# Patient Record
Sex: Female | Born: 1935
Health system: Southern US, Community
[De-identification: ages and names within clinical notes are randomized; demographics above are authoritative.]

## PROBLEM LIST (undated history)

## (undated) DIAGNOSIS — M26629 Arthralgia of temporomandibular joint, unspecified side: Secondary | ICD-10-CM

## (undated) DIAGNOSIS — E041 Nontoxic single thyroid nodule: Secondary | ICD-10-CM

## (undated) DIAGNOSIS — R011 Cardiac murmur, unspecified: Secondary | ICD-10-CM

## (undated) DIAGNOSIS — R51 Headache: Secondary | ICD-10-CM

## (undated) DIAGNOSIS — I739 Peripheral vascular disease, unspecified: Secondary | ICD-10-CM

## (undated) DIAGNOSIS — C642 Malignant neoplasm of left kidney, except renal pelvis: Secondary | ICD-10-CM

## (undated) DIAGNOSIS — H3530 Unspecified macular degeneration: Secondary | ICD-10-CM

## (undated) DIAGNOSIS — Z973 Presence of spectacles and contact lenses: Secondary | ICD-10-CM

## (undated) DIAGNOSIS — E049 Nontoxic goiter, unspecified: Secondary | ICD-10-CM

## (undated) DIAGNOSIS — E039 Hypothyroidism, unspecified: Secondary | ICD-10-CM

## (undated) DIAGNOSIS — F32A Depression, unspecified: Secondary | ICD-10-CM

## (undated) DIAGNOSIS — R310 Gross hematuria: Secondary | ICD-10-CM

## (undated) DIAGNOSIS — R739 Hyperglycemia, unspecified: Secondary | ICD-10-CM

## (undated) DIAGNOSIS — N390 Urinary tract infection, site not specified: Secondary | ICD-10-CM

## (undated) DIAGNOSIS — R300 Dysuria: Secondary | ICD-10-CM

## (undated) DIAGNOSIS — I10 Essential (primary) hypertension: Secondary | ICD-10-CM

## (undated) DIAGNOSIS — Z8781 Personal history of (healed) traumatic fracture: Secondary | ICD-10-CM

## (undated) DIAGNOSIS — K449 Diaphragmatic hernia without obstruction or gangrene: Secondary | ICD-10-CM

## (undated) DIAGNOSIS — F329 Major depressive disorder, single episode, unspecified: Secondary | ICD-10-CM

## (undated) DIAGNOSIS — R109 Unspecified abdominal pain: Secondary | ICD-10-CM

## (undated) DIAGNOSIS — Z9889 Other specified postprocedural states: Secondary | ICD-10-CM

## (undated) DIAGNOSIS — N2889 Other specified disorders of kidney and ureter: Secondary | ICD-10-CM

## (undated) DIAGNOSIS — M810 Age-related osteoporosis without current pathological fracture: Secondary | ICD-10-CM

## (undated) DIAGNOSIS — I2584 Coronary atherosclerosis due to calcified coronary lesion: Secondary | ICD-10-CM

## (undated) DIAGNOSIS — E278 Other specified disorders of adrenal gland: Secondary | ICD-10-CM

## (undated) DIAGNOSIS — M797 Fibromyalgia: Secondary | ICD-10-CM

## (undated) DIAGNOSIS — Z8489 Family history of other specified conditions: Secondary | ICD-10-CM

## (undated) DIAGNOSIS — I700 Atherosclerosis of aorta: Secondary | ICD-10-CM

## (undated) DIAGNOSIS — R112 Nausea with vomiting, unspecified: Secondary | ICD-10-CM

## (undated) DIAGNOSIS — M1990 Unspecified osteoarthritis, unspecified site: Secondary | ICD-10-CM

## (undated) DIAGNOSIS — K219 Gastro-esophageal reflux disease without esophagitis: Secondary | ICD-10-CM

## (undated) DIAGNOSIS — I2510 Atherosclerotic heart disease of native coronary artery without angina pectoris: Secondary | ICD-10-CM

## (undated) DIAGNOSIS — E119 Type 2 diabetes mellitus without complications: Secondary | ICD-10-CM

## (undated) DIAGNOSIS — Z85038 Personal history of other malignant neoplasm of large intestine: Secondary | ICD-10-CM

## (undated) DIAGNOSIS — D649 Anemia, unspecified: Secondary | ICD-10-CM

## (undated) DIAGNOSIS — R040 Epistaxis: Secondary | ICD-10-CM

## (undated) HISTORY — DX: Essential (primary) hypertension: I10

## (undated) HISTORY — PX: TONSILLECTOMY: SUR1361

## (undated) HISTORY — DX: Atherosclerotic heart disease of native coronary artery without angina pectoris: I25.10

## (undated) HISTORY — PX: OTHER SURGICAL HISTORY: SHX169

## (undated) HISTORY — PX: APPENDECTOMY: SHX54

## (undated) HISTORY — PX: TOTAL KNEE ARTHROPLASTY: SHX125

## (undated) HISTORY — PX: TEMPOROMANDIBULAR JOINT SURGERY: SHX35

## (undated) HISTORY — DX: Fibromyalgia: M79.7

## (undated) HISTORY — DX: Gastro-esophageal reflux disease without esophagitis: K21.9

## (undated) HISTORY — DX: Nontoxic goiter, unspecified: E04.9

## (undated) HISTORY — DX: Coronary atherosclerosis due to calcified coronary lesion: I25.84

## (undated) HISTORY — PX: LAPAROSCOPIC RIGHT HEMI COLECTOMY: SHX5926

## (undated) HISTORY — PX: ESOPHAGOGASTRODUODENOSCOPY ENDOSCOPY: SHX5814

## (undated) HISTORY — PX: COLONOSCOPY W/ BIOPSIES: SHX1374

## (undated) HISTORY — PX: CATARACT EXTRACTION W/ INTRAOCULAR LENS  IMPLANT, BILATERAL: SHX1307

---

## 1998-01-17 HISTORY — PX: LUMBAR SPINE SURGERY: SHX701

## 1998-04-27 ENCOUNTER — Other Ambulatory Visit: Admission: RE | Admit: 1998-04-27 | Discharge: 1998-04-27 | Payer: Self-pay | Admitting: Obstetrics and Gynecology

## 1998-06-10 ENCOUNTER — Encounter: Payer: Self-pay | Admitting: Neurosurgery

## 1998-06-10 ENCOUNTER — Ambulatory Visit (HOSPITAL_COMMUNITY): Admission: RE | Admit: 1998-06-10 | Discharge: 1998-06-11 | Payer: Self-pay | Admitting: Neurosurgery

## 1998-07-24 ENCOUNTER — Ambulatory Visit (HOSPITAL_COMMUNITY): Admission: RE | Admit: 1998-07-24 | Discharge: 1998-07-24 | Payer: Self-pay | Admitting: Gastroenterology

## 2000-06-05 ENCOUNTER — Other Ambulatory Visit: Admission: RE | Admit: 2000-06-05 | Discharge: 2000-06-05 | Payer: Self-pay | Admitting: Obstetrics and Gynecology

## 2000-06-22 ENCOUNTER — Encounter: Payer: Self-pay | Admitting: Family Medicine

## 2000-06-22 ENCOUNTER — Ambulatory Visit (HOSPITAL_COMMUNITY): Admission: RE | Admit: 2000-06-22 | Discharge: 2000-06-22 | Payer: Self-pay | Admitting: Family Medicine

## 2000-06-26 ENCOUNTER — Ambulatory Visit (HOSPITAL_COMMUNITY): Admission: RE | Admit: 2000-06-26 | Discharge: 2000-06-26 | Payer: Self-pay | Admitting: Cardiovascular Disease

## 2000-06-28 ENCOUNTER — Ambulatory Visit (HOSPITAL_COMMUNITY): Admission: RE | Admit: 2000-06-28 | Discharge: 2000-06-28 | Payer: Self-pay | Admitting: Cardiology

## 2000-07-12 ENCOUNTER — Encounter: Payer: Self-pay | Admitting: Obstetrics and Gynecology

## 2000-07-12 ENCOUNTER — Encounter: Admission: RE | Admit: 2000-07-12 | Discharge: 2000-07-12 | Payer: Self-pay | Admitting: Obstetrics and Gynecology

## 2001-05-10 ENCOUNTER — Ambulatory Visit (HOSPITAL_COMMUNITY): Admission: RE | Admit: 2001-05-10 | Discharge: 2001-05-10 | Payer: Self-pay | Admitting: Family Medicine

## 2001-05-10 ENCOUNTER — Encounter: Payer: Self-pay | Admitting: Family Medicine

## 2001-06-07 ENCOUNTER — Other Ambulatory Visit: Admission: RE | Admit: 2001-06-07 | Discharge: 2001-06-07 | Payer: Self-pay | Admitting: Obstetrics and Gynecology

## 2001-06-15 ENCOUNTER — Encounter (HOSPITAL_COMMUNITY): Admission: RE | Admit: 2001-06-15 | Discharge: 2001-07-15 | Payer: Self-pay | Admitting: Family Medicine

## 2001-07-16 ENCOUNTER — Encounter: Payer: Self-pay | Admitting: Obstetrics and Gynecology

## 2001-07-16 ENCOUNTER — Encounter: Admission: RE | Admit: 2001-07-16 | Discharge: 2001-07-16 | Payer: Self-pay | Admitting: Obstetrics and Gynecology

## 2001-08-10 ENCOUNTER — Ambulatory Visit (HOSPITAL_COMMUNITY): Admission: RE | Admit: 2001-08-10 | Discharge: 2001-08-10 | Payer: Self-pay | Admitting: Family Medicine

## 2001-08-10 ENCOUNTER — Encounter: Payer: Self-pay | Admitting: Family Medicine

## 2001-09-18 ENCOUNTER — Ambulatory Visit (HOSPITAL_COMMUNITY): Admission: RE | Admit: 2001-09-18 | Discharge: 2001-09-18 | Payer: Self-pay | Admitting: Gastroenterology

## 2001-09-18 ENCOUNTER — Encounter (INDEPENDENT_AMBULATORY_CARE_PROVIDER_SITE_OTHER): Payer: Self-pay | Admitting: Specialist

## 2001-09-18 HISTORY — PX: UPPER GASTROINTESTINAL ENDOSCOPY: SHX188

## 2001-09-20 ENCOUNTER — Encounter: Payer: Self-pay | Admitting: Neurosurgery

## 2001-09-20 ENCOUNTER — Ambulatory Visit (HOSPITAL_COMMUNITY): Admission: RE | Admit: 2001-09-20 | Discharge: 2001-09-20 | Payer: Self-pay | Admitting: Neurosurgery

## 2001-10-03 ENCOUNTER — Inpatient Hospital Stay (HOSPITAL_COMMUNITY): Admission: RE | Admit: 2001-10-03 | Discharge: 2001-10-09 | Payer: Self-pay | Admitting: Surgery

## 2001-10-03 ENCOUNTER — Encounter (INDEPENDENT_AMBULATORY_CARE_PROVIDER_SITE_OTHER): Payer: Self-pay | Admitting: Specialist

## 2001-10-30 ENCOUNTER — Ambulatory Visit (HOSPITAL_BASED_OUTPATIENT_CLINIC_OR_DEPARTMENT_OTHER): Admission: RE | Admit: 2001-10-30 | Discharge: 2001-10-30 | Payer: Self-pay | Admitting: Surgery

## 2001-10-30 ENCOUNTER — Encounter: Payer: Self-pay | Admitting: Surgery

## 2001-10-31 ENCOUNTER — Encounter: Payer: Self-pay | Admitting: Surgery

## 2001-10-31 ENCOUNTER — Ambulatory Visit (HOSPITAL_COMMUNITY): Admission: RE | Admit: 2001-10-31 | Discharge: 2001-10-31 | Payer: Self-pay | Admitting: Surgery

## 2002-05-23 ENCOUNTER — Ambulatory Visit (HOSPITAL_COMMUNITY): Admission: RE | Admit: 2002-05-23 | Discharge: 2002-05-23 | Payer: Self-pay | Admitting: Hematology & Oncology

## 2002-05-23 ENCOUNTER — Encounter: Payer: Self-pay | Admitting: Hematology & Oncology

## 2002-06-21 ENCOUNTER — Other Ambulatory Visit: Admission: RE | Admit: 2002-06-21 | Discharge: 2002-06-21 | Payer: Self-pay | Admitting: Obstetrics and Gynecology

## 2002-08-12 ENCOUNTER — Ambulatory Visit (HOSPITAL_COMMUNITY): Admission: RE | Admit: 2002-08-12 | Discharge: 2002-08-12 | Payer: Self-pay | Admitting: Hematology & Oncology

## 2002-08-12 ENCOUNTER — Encounter: Payer: Self-pay | Admitting: Hematology & Oncology

## 2002-09-25 ENCOUNTER — Ambulatory Visit (HOSPITAL_COMMUNITY): Admission: RE | Admit: 2002-09-25 | Discharge: 2002-09-25 | Payer: Self-pay | Admitting: Gastroenterology

## 2002-11-27 ENCOUNTER — Ambulatory Visit (HOSPITAL_COMMUNITY): Admission: RE | Admit: 2002-11-27 | Discharge: 2002-11-27 | Payer: Self-pay | Admitting: Hematology & Oncology

## 2003-02-10 ENCOUNTER — Ambulatory Visit (HOSPITAL_COMMUNITY): Admission: RE | Admit: 2003-02-10 | Discharge: 2003-02-10 | Payer: Self-pay | Admitting: Hematology & Oncology

## 2003-05-27 ENCOUNTER — Ambulatory Visit (HOSPITAL_COMMUNITY): Admission: RE | Admit: 2003-05-27 | Discharge: 2003-05-27 | Payer: Self-pay | Admitting: Hematology & Oncology

## 2003-06-27 ENCOUNTER — Ambulatory Visit (HOSPITAL_COMMUNITY): Admission: RE | Admit: 2003-06-27 | Discharge: 2003-06-27 | Payer: Self-pay | Admitting: General Surgery

## 2003-09-16 ENCOUNTER — Encounter: Admission: RE | Admit: 2003-09-16 | Discharge: 2003-09-16 | Payer: Self-pay | Admitting: Obstetrics and Gynecology

## 2003-10-13 ENCOUNTER — Ambulatory Visit (HOSPITAL_COMMUNITY): Admission: RE | Admit: 2003-10-13 | Discharge: 2003-10-13 | Payer: Self-pay | Admitting: Hematology & Oncology

## 2004-02-16 ENCOUNTER — Ambulatory Visit: Payer: Self-pay | Admitting: Hematology & Oncology

## 2004-02-18 ENCOUNTER — Ambulatory Visit (HOSPITAL_COMMUNITY): Admission: RE | Admit: 2004-02-18 | Discharge: 2004-02-18 | Payer: Self-pay | Admitting: Hematology & Oncology

## 2004-07-01 ENCOUNTER — Other Ambulatory Visit: Admission: RE | Admit: 2004-07-01 | Discharge: 2004-07-01 | Payer: Self-pay | Admitting: Obstetrics and Gynecology

## 2004-08-16 ENCOUNTER — Ambulatory Visit: Payer: Self-pay | Admitting: Hematology & Oncology

## 2004-08-19 ENCOUNTER — Ambulatory Visit (HOSPITAL_COMMUNITY): Admission: RE | Admit: 2004-08-19 | Discharge: 2004-08-19 | Payer: Self-pay | Admitting: Hematology & Oncology

## 2004-09-22 ENCOUNTER — Encounter: Admission: RE | Admit: 2004-09-22 | Discharge: 2004-09-22 | Payer: Self-pay | Admitting: Obstetrics and Gynecology

## 2005-02-24 ENCOUNTER — Ambulatory Visit: Payer: Self-pay | Admitting: Hematology & Oncology

## 2005-04-21 ENCOUNTER — Emergency Department (HOSPITAL_COMMUNITY): Admission: EM | Admit: 2005-04-21 | Discharge: 2005-04-21 | Payer: Self-pay | Admitting: Emergency Medicine

## 2005-06-28 ENCOUNTER — Other Ambulatory Visit: Admission: RE | Admit: 2005-06-28 | Discharge: 2005-06-28 | Payer: Self-pay | Admitting: Obstetrics & Gynecology

## 2005-08-24 ENCOUNTER — Ambulatory Visit: Payer: Self-pay | Admitting: Hematology & Oncology

## 2005-08-26 LAB — COMPREHENSIVE METABOLIC PANEL
Albumin: 4.6 g/dL (ref 3.5–5.2)
BUN: 13 mg/dL (ref 6–23)
CO2: 23 mEq/L (ref 19–32)
Calcium: 9.4 mg/dL (ref 8.4–10.5)
Chloride: 102 mEq/L (ref 96–112)
Creatinine, Ser: 1.09 mg/dL (ref 0.40–1.20)
Glucose, Bld: 114 mg/dL — ABNORMAL HIGH (ref 70–99)
Potassium: 4.2 mEq/L (ref 3.5–5.3)

## 2005-08-26 LAB — CBC WITH DIFFERENTIAL/PLATELET
Basophils Absolute: 0 10*3/uL (ref 0.0–0.1)
Eosinophils Absolute: 0.4 10*3/uL (ref 0.0–0.5)
HCT: 42.5 % (ref 34.8–46.6)
HGB: 14.8 g/dL (ref 11.6–15.9)
LYMPH%: 28.7 % (ref 14.0–48.0)
MCHC: 34.7 g/dL (ref 32.0–36.0)
MONO#: 0.7 10*3/uL (ref 0.1–0.9)
NEUT#: 3.6 10*3/uL (ref 1.5–6.5)
NEUT%: 54.3 % (ref 39.6–76.8)
Platelets: 266 10*3/uL (ref 145–400)
WBC: 6.6 10*3/uL (ref 3.9–10.0)

## 2005-09-26 ENCOUNTER — Encounter: Admission: RE | Admit: 2005-09-26 | Discharge: 2005-09-26 | Payer: Self-pay | Admitting: Obstetrics and Gynecology

## 2006-02-22 ENCOUNTER — Ambulatory Visit: Payer: Self-pay | Admitting: Hematology & Oncology

## 2006-02-27 LAB — CBC WITH DIFFERENTIAL/PLATELET
BASO%: 0.1 % (ref 0.0–2.0)
Basophils Absolute: 0 10*3/uL (ref 0.0–0.1)
EOS%: 3.1 % (ref 0.0–7.0)
HCT: 41.4 % (ref 34.8–46.6)
LYMPH%: 12.9 % — ABNORMAL LOW (ref 14.0–48.0)
MCH: 36.2 pg — ABNORMAL HIGH (ref 26.0–34.0)
MCHC: 35.6 g/dL (ref 32.0–36.0)
MCV: 101.6 fL — ABNORMAL HIGH (ref 81.0–101.0)
NEUT%: 71.6 % (ref 39.6–76.8)
Platelets: 198 10*3/uL (ref 145–400)

## 2006-02-28 LAB — COMPREHENSIVE METABOLIC PANEL
ALT: 17 U/L (ref 0–35)
AST: 20 U/L (ref 0–37)
BUN: 12 mg/dL (ref 6–23)
Creatinine, Ser: 0.97 mg/dL (ref 0.40–1.20)
Total Bilirubin: 0.3 mg/dL (ref 0.3–1.2)

## 2006-02-28 LAB — CEA: CEA: 2.2 ng/mL (ref 0.0–5.0)

## 2006-05-26 ENCOUNTER — Encounter: Admission: RE | Admit: 2006-05-26 | Discharge: 2006-05-26 | Payer: Self-pay

## 2006-08-24 ENCOUNTER — Ambulatory Visit: Payer: Self-pay | Admitting: Hematology & Oncology

## 2006-08-28 LAB — CBC WITH DIFFERENTIAL/PLATELET
Basophils Absolute: 0 10*3/uL (ref 0.0–0.1)
EOS%: 7.7 % — ABNORMAL HIGH (ref 0.0–7.0)
Eosinophils Absolute: 0.6 10*3/uL — ABNORMAL HIGH (ref 0.0–0.5)
HCT: 41.1 % (ref 34.8–46.6)
HGB: 14.6 g/dL (ref 11.6–15.9)
MCH: 36.5 pg — ABNORMAL HIGH (ref 26.0–34.0)
MCV: 102.8 fL — ABNORMAL HIGH (ref 81.0–101.0)
NEUT#: 4.9 10*3/uL (ref 1.5–6.5)
NEUT%: 59.1 % (ref 39.6–76.8)
RDW: 12.3 % (ref 11.3–14.5)
lymph#: 1.9 10*3/uL (ref 0.9–3.3)

## 2006-08-28 LAB — COMPREHENSIVE METABOLIC PANEL
Albumin: 4.5 g/dL (ref 3.5–5.2)
Alkaline Phosphatase: 47 U/L (ref 39–117)
BUN: 19 mg/dL (ref 6–23)
CO2: 25 mEq/L (ref 19–32)
Calcium: 10.3 mg/dL (ref 8.4–10.5)
Glucose, Bld: 99 mg/dL (ref 70–99)
Potassium: 5.3 mEq/L (ref 3.5–5.3)
Sodium: 141 mEq/L (ref 135–145)
Total Protein: 7.6 g/dL (ref 6.0–8.3)

## 2006-09-15 ENCOUNTER — Encounter: Admission: RE | Admit: 2006-09-15 | Discharge: 2006-09-15 | Payer: Self-pay

## 2006-09-29 ENCOUNTER — Encounter: Admission: RE | Admit: 2006-09-29 | Discharge: 2006-09-29 | Payer: Self-pay | Admitting: Family Medicine

## 2007-02-21 ENCOUNTER — Ambulatory Visit: Payer: Self-pay | Admitting: Hematology & Oncology

## 2007-02-26 LAB — COMPREHENSIVE METABOLIC PANEL
Alkaline Phosphatase: 49 U/L (ref 39–117)
Creatinine, Ser: 0.99 mg/dL (ref 0.40–1.20)
Glucose, Bld: 138 mg/dL — ABNORMAL HIGH (ref 70–99)
Sodium: 140 mEq/L (ref 135–145)
Total Bilirubin: 0.4 mg/dL (ref 0.3–1.2)
Total Protein: 7.5 g/dL (ref 6.0–8.3)

## 2007-02-26 LAB — CBC WITH DIFFERENTIAL/PLATELET
BASO%: 1 % (ref 0.0–2.0)
EOS%: 6.1 % (ref 0.0–7.0)
MCH: 36.6 pg — ABNORMAL HIGH (ref 26.0–34.0)
MCHC: 34.9 g/dL (ref 32.0–36.0)
MONO#: 0.5 10*3/uL (ref 0.1–0.9)
RBC: 3.95 10*6/uL (ref 3.70–5.32)
RDW: 12.2 % (ref 11.3–14.5)
WBC: 6.7 10*3/uL (ref 3.9–10.0)
lymph#: 1.3 10*3/uL (ref 0.9–3.3)

## 2007-07-05 ENCOUNTER — Other Ambulatory Visit: Admission: RE | Admit: 2007-07-05 | Discharge: 2007-07-05 | Payer: Self-pay | Admitting: Obstetrics & Gynecology

## 2007-08-23 ENCOUNTER — Ambulatory Visit: Payer: Self-pay | Admitting: Hematology

## 2007-08-27 ENCOUNTER — Ambulatory Visit (HOSPITAL_COMMUNITY): Admission: RE | Admit: 2007-08-27 | Discharge: 2007-08-27 | Payer: Self-pay | Admitting: Oncology

## 2007-08-27 LAB — CBC WITH DIFFERENTIAL/PLATELET
BASO%: 0.2 % (ref 0.0–2.0)
Eosinophils Absolute: 0.4 10*3/uL (ref 0.0–0.5)
HCT: 40.2 % (ref 34.8–46.6)
LYMPH%: 24.2 % (ref 14.0–48.0)
MCHC: 34.5 g/dL (ref 32.0–36.0)
MONO#: 0.7 10*3/uL (ref 0.1–0.9)
NEUT#: 4.3 10*3/uL (ref 1.5–6.5)
NEUT%: 60.1 % (ref 39.6–76.8)
Platelets: 248 10*3/uL (ref 145–400)
RBC: 3.81 10*6/uL (ref 3.70–5.32)
WBC: 7.2 10*3/uL (ref 3.9–10.0)
lymph#: 1.7 10*3/uL (ref 0.9–3.3)

## 2007-08-27 LAB — COMPREHENSIVE METABOLIC PANEL
ALT: 13 U/L (ref 0–35)
CO2: 26 mEq/L (ref 19–32)
Calcium: 9.8 mg/dL (ref 8.4–10.5)
Chloride: 102 mEq/L (ref 96–112)
Glucose, Bld: 93 mg/dL (ref 70–99)
Sodium: 139 mEq/L (ref 135–145)
Total Protein: 7.5 g/dL (ref 6.0–8.3)

## 2007-08-27 LAB — CEA: CEA: 2.5 ng/mL (ref 0.0–5.0)

## 2007-10-01 ENCOUNTER — Encounter: Admission: RE | Admit: 2007-10-01 | Discharge: 2007-10-01 | Payer: Self-pay | Admitting: Obstetrics and Gynecology

## 2008-02-25 ENCOUNTER — Ambulatory Visit: Payer: Self-pay | Admitting: Hematology

## 2008-02-29 LAB — COMPREHENSIVE METABOLIC PANEL
Alkaline Phosphatase: 49 U/L (ref 39–117)
BUN: 19 mg/dL (ref 6–23)
CO2: 26 mEq/L (ref 19–32)
Creatinine, Ser: 1.15 mg/dL (ref 0.40–1.20)
Glucose, Bld: 124 mg/dL — ABNORMAL HIGH (ref 70–99)
Total Bilirubin: 0.5 mg/dL (ref 0.3–1.2)
Total Protein: 7.6 g/dL (ref 6.0–8.3)

## 2008-02-29 LAB — CBC WITH DIFFERENTIAL/PLATELET
Basophils Absolute: 0 10*3/uL (ref 0.0–0.1)
Eosinophils Absolute: 0.3 10*3/uL (ref 0.0–0.5)
HCT: 42.1 % (ref 34.8–46.6)
HGB: 14.4 g/dL (ref 11.6–15.9)
LYMPH%: 19.8 % (ref 14.0–48.0)
MCV: 103.9 fL — ABNORMAL HIGH (ref 81.0–101.0)
MONO#: 0.8 10*3/uL (ref 0.1–0.9)
MONO%: 11.7 % (ref 0.0–13.0)
NEUT#: 4.2 10*3/uL (ref 1.5–6.5)
NEUT%: 63.5 % (ref 39.6–76.8)
Platelets: 223 10*3/uL (ref 145–400)
RBC: 4.05 10*6/uL (ref 3.70–5.32)
WBC: 6.6 10*3/uL (ref 3.9–10.0)

## 2008-02-29 LAB — CEA: CEA: 2.3 ng/mL (ref 0.0–5.0)

## 2008-03-10 ENCOUNTER — Encounter (INDEPENDENT_AMBULATORY_CARE_PROVIDER_SITE_OTHER): Payer: Self-pay | Admitting: Family Medicine

## 2008-03-10 ENCOUNTER — Ambulatory Visit: Payer: Self-pay | Admitting: Cardiology

## 2008-03-10 ENCOUNTER — Ambulatory Visit (HOSPITAL_COMMUNITY): Admission: RE | Admit: 2008-03-10 | Discharge: 2008-03-10 | Payer: Self-pay | Admitting: Family Medicine

## 2008-10-22 ENCOUNTER — Encounter: Admission: RE | Admit: 2008-10-22 | Discharge: 2008-10-22 | Payer: Self-pay | Admitting: Obstetrics and Gynecology

## 2009-02-23 ENCOUNTER — Ambulatory Visit (HOSPITAL_BASED_OUTPATIENT_CLINIC_OR_DEPARTMENT_OTHER): Payer: MEDICARE | Admitting: Oncology

## 2009-02-25 LAB — COMPREHENSIVE METABOLIC PANEL
AST: 15 U/L (ref 0–37)
Alkaline Phosphatase: 50 U/L (ref 39–117)
BUN: 15 mg/dL (ref 6–23)
Creatinine, Ser: 1.06 mg/dL (ref 0.40–1.20)
Total Bilirubin: 0.4 mg/dL (ref 0.3–1.2)

## 2009-02-25 LAB — CBC WITH DIFFERENTIAL/PLATELET
Basophils Absolute: 0 10*3/uL (ref 0.0–0.1)
EOS%: 1.8 % (ref 0.0–7.0)
HCT: 38.7 % (ref 34.8–46.6)
HGB: 13.2 g/dL (ref 11.6–15.9)
LYMPH%: 11.9 % — ABNORMAL LOW (ref 14.0–49.7)
MCH: 35.3 pg — ABNORMAL HIGH (ref 25.1–34.0)
MCV: 103.8 fL — ABNORMAL HIGH (ref 79.5–101.0)
MONO%: 7.8 % (ref 0.0–14.0)
NEUT%: 78.1 % — ABNORMAL HIGH (ref 38.4–76.8)
Platelets: 304 10*3/uL (ref 145–400)
RDW: 12.9 % (ref 11.2–14.5)

## 2009-10-16 ENCOUNTER — Ambulatory Visit (HOSPITAL_COMMUNITY): Admission: RE | Admit: 2009-10-16 | Discharge: 2009-10-16 | Payer: Self-pay | Admitting: Family Medicine

## 2009-11-02 ENCOUNTER — Encounter: Admission: RE | Admit: 2009-11-02 | Discharge: 2009-11-02 | Payer: Self-pay | Admitting: Obstetrics and Gynecology

## 2010-02-24 ENCOUNTER — Other Ambulatory Visit: Payer: Self-pay | Admitting: Oncology

## 2010-02-24 ENCOUNTER — Encounter (HOSPITAL_BASED_OUTPATIENT_CLINIC_OR_DEPARTMENT_OTHER): Payer: MEDICARE | Admitting: Oncology

## 2010-02-24 DIAGNOSIS — M810 Age-related osteoporosis without current pathological fracture: Secondary | ICD-10-CM

## 2010-02-24 DIAGNOSIS — Z85038 Personal history of other malignant neoplasm of large intestine: Secondary | ICD-10-CM

## 2010-02-24 LAB — CBC WITH DIFFERENTIAL/PLATELET
BASO%: 0.3 % (ref 0.0–2.0)
EOS%: 6.3 % (ref 0.0–7.0)
HCT: 39.3 % (ref 34.8–46.6)
MCH: 34.9 pg — ABNORMAL HIGH (ref 25.1–34.0)
MCHC: 34 g/dL (ref 31.5–36.0)
NEUT%: 60.3 % (ref 38.4–76.8)
RDW: 12.6 % (ref 11.2–14.5)
lymph#: 1.2 10*3/uL (ref 0.9–3.3)

## 2010-02-24 LAB — COMPREHENSIVE METABOLIC PANEL
ALT: 14 U/L (ref 0–35)
AST: 17 U/L (ref 0–37)
Calcium: 9.7 mg/dL (ref 8.4–10.5)
Chloride: 102 mEq/L (ref 96–112)
Creatinine, Ser: 0.91 mg/dL (ref 0.40–1.20)

## 2010-06-04 NOTE — Op Note (Signed)
NAME:  [name], [name]                      ACCOUNT NO.:  [id number]   MEDICAL RECORD NO.:  [id number]                   PATIENT TYPE:  AMB   LOCATION:  ENDO                                 FACILITY:  [facility]   PHYSICIAN:  Petra Kuba, M.D.                 DATE OF BIRTH:  Mar 22, 1932   DATE OF PROCEDURE:  09/25/2002  DATE OF DISCHARGE:                                 OPERATIVE REPORT   PROCEDURE:  Colonoscopy.   INDICATIONS FOR PROCEDURE:  A patient with a history of colon cancer due for  repeat screening. Consent was signed after risks, benefits, methods, and  options were thoroughly discussed in the past.   MEDICINES USED:  Demerol 80, Versed 8.   DESCRIPTION OF PROCEDURE:  Rectal inspection was pertinent for small  external hemorrhoids. Digital exam was negative. The pediatric video  adjustable colonoscope was inserted, easily advanced in spite a very  tortuous sigmoid filled with diverticula to the anastomosis which was  identified by the colon side to probably the small bowel side as well. We  did try to advance this, however, it did require abdominal pressure but no  position changes. To advance deep into the small bowel, we did try to roll  her on her back and then on her right side but could not advance through the  anastomosis. There was some friability but no mass lesion. There was a tiny  ulcer on the anastomosis, the colon end was normal. After a few attempts  with her in the different positions and trying abdominal pressure, we  elected to slowly withdraw. On slow withdrawal, other than the sigmoid  diverticula no abnormalities were seen. The prep was adequate. There was  some liquid stool that required washing and suctioning. Anorectal pull  through and retroflexion confirmed some hemorrhoids. There was a little  trauma to them with a little bit of heme. The scope was reinserted a short  ways up the left side of the colon, air was suctioned, scope removed. The  patient tolerated the procedure well. There was no obvious or immediate  complications.   ENDOSCOPIC DIAGNOSIS:  1. Internal and external hemorrhoids.  2. Left sided diverticula.  3. No abnormalities seen on exam to the anastomosis which looked like the     colon side to probably the small bowel side, unable to enter with minimal     anastomotic friability and a tiny anastomotic ulcer.   PLAN:  Happy to see back p.r.n., otherwise, return care to Dr. Myna Hidalgo for  the customary colon cancer followup. Recheck colon screening in three years  if doing well medically.                                               Petra Kuba, M.D.  MEM/MEDQ  D:  09/25/2002  T:  09/25/2002  Job:  [id number]   cc:   Thornton Park Daphine Deutscher, M.D.  [address]  [place]  [place] [address]  Fax: [phone]   Rose Phi. Myna Hidalgo, M.D.  [address]  [place], [place] [address]  Fax: [phone]   Angus G. Renard Matter, M.D.  [address]  [place]  [place] [address]  Fax: [phone]

## 2010-06-04 NOTE — Op Note (Signed)
   [name], [name]                     ACCOUNT NO.:  [id number]   MEDICAL RECORD NO.:  [id number]                   PATIENT TYPE:  AMB   LOCATION:  ENDO                                 FACILITY:  [facility]   PHYSICIAN:  Petra Kuba, M.D.                 DATE OF BIRTH:  Apr 23, 1932   DATE OF PROCEDURE:  09/18/2001  DATE OF DISCHARGE:                                 OPERATIVE REPORT   PROCEDURE:  Esophagogastroduodenoscopy.   INDICATIONS FOR PROCEDURE:  Upper tract symptoms. Consent was signed after  risks, benefits, methods, and options were thoroughly discussed in the  office and before any premeds given.   ADDITIONAL MEDICINES FOR THIS PROCEDURE:  Demerol 40, Versed 4.   DESCRIPTION OF PROCEDURE:  The video endoscope was inserted by direct  vision, the esophagus was normal. In the distal esophagus was a tiny hiatal  hernia. The scope passed into the stomach with some atrophic gastritis seen.  I advanced through a normal antrum and normal pylorus into a normal duodenal  bulb and around the C loop to a normal second potion of the duodenum. The  scope was withdrawn back to the bulb and a good look there ruled out  abnormalities in that location. The scope was withdrawn back to the stomach  and retroflexed. The angularis, cardia, fundus, lesser and greater curve  were all normal except for the atrophic gastritis. Straight visualization of  the stomach confirmed the atrophic gastritis, decreased folds, and ruled out  any other etiology. Air was suction, scope slowly withdrawn. Again a good  look at the esophagus was normal except for the tiny hiatal hernia. The  scope was removed. The patient tolerated this part of the procedure well.  There was no obvious or immediate complications.   ENDOSCOPIC DIAGNOSIS:  1. Tiny hiatal hernia.  2. Atrophic gastritis.  3. Otherwise normal EGD.    PLAN:  Await biopsies from the colon, if negative consider an ultrasound of  her  gallbladder and then possibly continue workup with a CT, BE, or small  bowel follow-through to try to better delineate the IC valve  lesion and  make sure no significant findings there. In the future if colonoscopy is  needed to be retried might prefer to use Diprivan for better relaxation.                                               Petra Kuba, M.D.    MEM/MEDQ  D:  09/18/2001  T:  09/18/2001  Job:  [id number]   cc:   Angus G. Renard Matter, M.D.   Laqueta Linden, M.D.  [address]  [place]  [place] [address]  Fax: [phone]

## 2010-06-04 NOTE — Discharge Summary (Signed)
   [name], [name]                      ACCOUNT NO.:  [id number]   MEDICAL RECORD NO.:  [id number]                   PATIENT TYPE:  INP   LOCATION:  0364                                 FACILITY:  [facility]   PHYSICIAN:  Thornton Park. Daphine Deutscher, M.D.             DATE OF BIRTH:  1932-02-23   DATE OF ADMISSION:  10/03/2001  DATE OF DISCHARGE:  10/09/2001                                 DISCHARGE SUMMARY   ADMISSION DIAGNOSIS:  Ileocecal valve carcinoma.   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   ADMISSION HISTORY:  The patient is a 77 year old lady with recently  discovered ileocecal valve carcinoma after presenting with some anemia.  This was biopsied on colonoscopy by Dr. Ewing Schlein.   ACCESSORY CLINICAL FINDINGS:  Included the aforementioned pathology report.  There was an admitting hemoglobin of 9.7 and a hemoglobin of 7.7 done just  prior to discharge on September 20th.  This is to be repeated today and will  be assessed, and if it has dropped lower will get in contact with Dr.  Renard Matter regarding possible outpatient transfusion.   HOSPITAL COURSE:  The patient was admitted on September 17th as an  outpatient going to the OR for laparoscopically-assisted right  hemicolectomy.  This was done and was fairly straightforward.  Postoperatively, she did well.  She was begun on liquids and was advanced.  She was seen by Quintin [place] C. Catha Gosselin, M.D. who made arrangements for her to  follow up and see Rose Phi. Myna Hidalgo, M.D. on Wednesday, October 8th at 1:30  to discuss six-month of adjuvant 5-FU/leucovorin therapy.  Prior to  discharge, her staples were removed.  CBC is pending at this time and will  be reported later.    IMPRESSION:  Status post laparoscopically-assisted right hemicolectomy for  carcinoma of the right colon with followup planned by me in three weeks and  with Dr. Myna Hidalgo on October 7th.  The  patient will also follow up with Dr.  Renard Matter should outpatient transfusion therapy be required.  Otherwise she is  instructed to get back on her iron and vitamin C.                                               Thornton Park Daphine Deutscher, M.D.    MBM/MEDQ  D:  10/09/2001  T:  10/09/2001  Job:  [id number]   cc:   Angus G. Renard Matter, M.D.   Laqueta Linden, M.D.  [address]  [place]  [place] [address]  Fax: [phone]   Petra Kuba, M.D.  [address]  [place]  [place] [address]  Fax: [phone]

## 2010-06-04 NOTE — Op Note (Signed)
[name], [name]                      ACCOUNT NO.:  [id number]   MEDICAL RECORD NO.:  [id number]                   PATIENT TYPE:  INP   LOCATION:  0364                                 FACILITY:  [facility]   PHYSICIAN:  Thornton Park. Daphine Deutscher, M.D.             DATE OF BIRTH:  1932/12/31   DATE OF PROCEDURE:  10/03/2001  DATE OF DISCHARGE:                                 OPERATIVE REPORT   PREOPERATIVE DIAGNOSES:  Ileocecal valve carcinoma.   POSTOPERATIVE DIAGNOSES:  Ileocecal valve carcinoma with mass appearing to  be contained within the bowel wall.   PROCEDURE:  Laparoscopically assisted right hemicolectomy.   SURGEON:  Thornton Park. Daphine Deutscher, M.D.   ASSISTANT:  Ollen Gross. Carolynne Edouard, M.D.   ANESTHESIA:  General endotracheal.   DESCRIPTION OF PROCEDURE:  [name] was taken to room one and given general  anesthesia. The abdomen was prepped with Betadine and draped sterilely. A  transverse incision was made just above the umbilicus to allow insertion of  the Hasson cannula. The patient had had a previous appendectomy through a  large para median incision in the right lower quadrant. She had numerous  adhesions in the right lower quadrant. Two 5 mm were placed on the patient's  left side and with the harmonic scalpel, I took down all the adhesions in  the right lower quadrant extending over to the midline.   I then began mobilizing the right colon using the harmonic scalpel. I  identified the ileocecal region and found the terminal ileum to be stuck to  the sidewall. I mobilized that first and then mobilized the right colon up  to the hepatic flexure. I was then able to incise the peritoneal reflection  and generate a nice plane to mobilize this carrying it down beyond the  gallbladder into the stomach and then the mid transverse colon. There was  unable to be seen by me at that point a lot of omentum that was stuck down  medially along the mesentery. I had to subsequently that down when  I had  opened. I then made a small transverse incision and did not have to  completely divide the rectus but did use the wound protector/dexterity port  and was able to exteriorize the right colon after again freeing up these  mesenteric adhesions.  The terminal ileum was identified and transected well  proximal to the tumor. The mesentery had been divided and then I carried  this line of resection to the transverse colon where I divided the  transverse colon. I then used the harmonic to come down to vascular pedicle  which I clamped with two clamps, divided, over sewed with suture ligatures  of 2-0 silk. The remaining portion of the mesentery was able to be  transected with the harmonic scalpel.   The specimen was sent to Dr. __________ who confirmed the presence of the  tumor and good margins. Functional end to end  was performed applying the  antimesenteric borders together orienting with the suture. Prior to doing  this, I did close the mesenteric defect with interrupted 3-0 silks. Using  the 7 cm linear stapler, I then created a common channel between the  terminal ileum and the transverse colon and then closed the common defect  with a TA-60. Good closure was felt to be present and no evidence of  bleeding.   The resected and reanastomosed colon was then reintroduced to the abdomen. I  then irrigated with a liter of saline. No evidence of bleeding was noted. I  removed the wound retractor and then got a correct sponge and needle count.  The posterior sheath was closed with running #1 PDS in the midline and the  anterior sheath was likewise closed to the midline. The wound was  infiltrated with 0.5% Marcaine and closed with staples. All of the other  three trocar sites were closed with a single staple as well. The patient  seemed to tolerate the procedure well and was taken to the recovery room in  satisfactory condition.                                                Thornton Park  Daphine Deutscher, M.D.    MBM/MEDQ  D:  10/03/2001  T:  10/03/2001  Job:  [id number]   cc:   Angus G. Renard Matter, M.D.   Laqueta Linden, M.D.  [address]  [place]  [place] [address]  Fax: [phone]   Petra Kuba, M.D.  [address]  [place]  [place] [address]  Fax: [phone]

## 2010-06-04 NOTE — Consult Note (Signed)
   NAME:  TERREL, MANALO                      ACCOUNT NO.:  [id number]   MEDICAL RECORD NO.:  [id number]                   PATIENT TYPE:  INP   LOCATION:  0364                                 FACILITY:  [facility]   PHYSICIAN:  Lowell C. Catha Gosselin, M.D.               DATE OF BIRTH:  Oct 30, 1932   DATE OF CONSULTATION:  10/05/2001  DATE OF DISCHARGE:                                   CONSULTATION   SUMMARY:  The patient is a 77 year old female patient who presented with GI  bleeding and subsequently underwent colonoscopy by Dr. Ewing Schlein with a  plaquelike area at the ileocecal valve seen and biopsied and returned  adenocarcinoma. Upper endoscopy September 18, 2001 have been negative.  Preoperative CEA on September 27, 2001 was 0.9. She underwent  laparoscopically assisted right hemicolectomy by Dr. Rolan Bucco on October 03, 2001 here at [facility] and pathology returned [phone] showing a 4.5  cm primary cancer grade 2 adenocarcinoma 4/18 pericolonic lymph nodes  involved, margins negative. Pathology states T3N2 lesion. Postoperatively  she has had an uneventful course.   PAST MEDICAL HISTORY:  Positive for tonsillectomy, appendectomy, back  surgery and TMJ surgery. Allergy to SULFA.   MEDICATIONS:  Norvasc, Prempro, nortriptyline, Fosamax, Prevacid and  Lipitor.   FAMILY HISTORY:  Negative for cancer.   SOCIAL HISTORY:  Lives in [place], Dr. Myrlene Broker sees her.   REVIEW OF SYMPTOMS:  Basically as above.   PHYSICAL EXAMINATION:  We defer on her exam tonight.   LABORATORY DATA:  Laboratory studies from September 19 show a white count of  10.6, hemoglobin of 8, platelet count 409,000.   ASSESSMENT:  Dukes C2 colon carcinoma resected.   PLAN:  We will recommend six months of adjuvant 5-FU leucovorin therapy to  reduce the risk of recurrence. She will see Dr. Arlan Organ Wednesday,  October 8 at 1:30 p.m. to discuss this further.   Thank you for allowing [place] to share in her  care.                                                Lowell C. Catha Gosselin, M.D.    LCS/MEDQ  D:  10/05/2001  T:  10/06/2001  Job:  [id number]   cc:   Thornton Park Daphine Deutscher, M.D.  Fax: [phone]   Angus G. Renard Matter, M.D.   Laqueta Linden, M.D.  [address]  [place]  [place] [address]  Fax: [phone]   Petra Kuba, M.D.  [address]  [place]  [place] [address]  Fax: [phone]   Rose Phi. Myna Hidalgo, M.D.  [address] [facility]  [place], [place] [address]  Fax: [phone]

## 2010-06-04 NOTE — Op Note (Signed)
NAMEDEVAN, BABINO                      ACCOUNT NO.:  [id number]   MEDICAL RECORD NO.:  [id number]                   PATIENT TYPE:  AMB   LOCATION:  DAY                                  FACILITY:  [facility]   PHYSICIAN:  Gita Kudo, M.D.              DATE OF BIRTH:  1932/10/26   DATE OF PROCEDURE:  06/27/2003  DATE OF DISCHARGE:                                 OPERATIVE REPORT   OPERATIVE PROCEDURE:  Removal of venous access device.   SURGEON:  Gita Kudo, M.D.   ANESTHESIA:  MAC - IV sedation, local 1% Xylocaine.   PREOPERATIVE DIAGNOSIS:  Port-A-Cath, used for chemotherapy, no longer  needed.   POSTOPERATIVE DIAGNOSIS:  Port-A-Cath, used for chemotherapy, no longer  needed.   CLINICAL SUMMARY:  A 77 year old woman had colon cancer and chemotherapy  through a Port-A-Cath, and it is no longer required.  Surgery done by Dr.  Daphine Deutscher.  Oncology service wished the catheter to be removed since she is off  Coumadin, and Dr. Daphine Deutscher is on vacation.   OPERATIVE FINDINGS:  The right-sided venous access system was removed  without complications.   OPERATIVE PROCEDURE:  Under satisfactory intravenous sedation, the patient  was positioned, prepped and draped in a standard fashion.  The transverse  incision was reopened along her right pectoral area and carried down to the  venous access device.  The pocket was entered, and the sutures holding the  device in place were cut.  The patient's head was elevated, and the catheter  and the attached reservoir were removed intact, without complication,  bleeding, etc.  Following this, more 1% Xylocaine with epinephrine was  infiltrated and then the wound closed in layers with 3-0 Vicryl and Steri-  Strips.  A sterile absorbent dressing was applied, and the patient went to  the recovery room from the operating room in good condition.                                               Gita Kudo, M.D.    MRL/MEDQ  D:  06/27/2003   T:  06/27/2003  Job:  [id number]   cc:   Rose Phi. Myna Hidalgo, M.D.  [address] [facility]  [place], [place] [address]  Fax: [phone]

## 2010-06-04 NOTE — Op Note (Signed)
[name], GLANZ                     ACCOUNT NO.:  [id number]   MEDICAL RECORD NO.:  [id number]                   PATIENT TYPE:  AMB   LOCATION:  ENDO                                 FACILITY:  [facility]   PHYSICIAN:  Petra Kuba, M.D.                 DATE OF BIRTH:  1932/04/24   DATE OF PROCEDURE:  DATE OF DISCHARGE:                                 OPERATIVE REPORT   PROCEDURE:  Colonoscopy with biopsy.   INDICATIONS FOR PROCEDURE:  A patient with bright red blood per rectum,  multiple GI complaints, almost due for colonic screening. Consent was signed  after risks, benefits, methods, and options were thoroughly discussed in the  office.   MEDICINES USED:  Demerol 100, Versed 10.   DESCRIPTION OF PROCEDURE:  Rectal inspection was pertinent for external  hemorrhoids, small. Digital exam is negative. The pediatric video adjustable  colonoscope was inserted, easily advanced to the splenic flexure. At that  point, the scope began to loop, we had to roll her on her back and then on  her side. We were able to advance to the mid ascending or we could see the  ileocecal valve in the distance. Unfortunately, there seemed to be an  abnormality just next to the valve. In the mid ascending, a small polyp was  seen and was hot biopsied at this juncture. To advance to the cecal pole did  require rolling her back on her left side and even on her stomach and we  were able to advance to the cecal pole which was normal and identified by  the appendiceal orifice. Just next to the valve seemed to be some ulcerative  inflammation and a few cold biopsies were obtained. Unfortunately, we could  not get excellent biopsies or excellent visualization based on the  difficulty of maintaining position here. We did try rolling her on her left  side which made things worse. We rolled her back on her abdomen. We then  went ahead and withdrew. Other than some left sided diverticula, no other  polypoid  lesions, masses or other abnormalities were seen as we slowly  withdrew back to the rectum. The prep was adequate. There was some liquid  stool that required washing and suctioning. There was some scope trauma with  some linear erythema in the rectum and distal sigmoid but no other  abnormalities. Once back in the rectum, the scope was retroflexed revealing  some small internal hemorrhoids. The scope was straightened, air was  suctioned, scope removed. The patient tolerated the procedure fair. There  was some scope trauma in the rectum and sigmoid but hopefully no significant  complication.   ENDOSCOPIC DIAGNOSIS:  1. Internal and external hemorrhoids.  2. Left occasional diverticula.  3. Tortuous long looping colon.  4. Questionable ascending polyp hot biopsied.  5. Questionable IC valve ulcer and erythema status post biopsy.  6. Trauma to the distal sigmoid and  rectum.  7. Otherwise within normal limits to the cecum.    PLAN:  Await pathology. Since she seems to be doing okay post procedure will  go ahead and do the one time quick Endo for her stomach burning and upper  tract symptoms.                                               Petra Kuba, M.D.    MEM/MEDQ  D:  09/18/2001  T:  09/18/2001  Job:  [id number]   cc:   Laqueta Linden, M.D.  [address]  [place]  [place] [address]  Fax: [phone]   Angus G. Renard Matter, M.D.

## 2010-06-04 NOTE — Op Note (Signed)
   NAME:  [name], Sara B                      ACCOUNT NO.:  [id number]   MEDICAL RECORD NO.:  [id number]                   PATIENT TYPE:  AMB   LOCATION:  DSC                                  FACILITY:  [facility]   PHYSICIAN:  Thornton Park. Daphine Deutscher, M.D.             DATE OF BIRTH:  Nov 06, 1932   DATE OF PROCEDURE:  10/30/2001  DATE OF DISCHARGE:                                 OPERATIVE REPORT   PREOPERATIVE DIAGNOSIS:  Carcinoma of the right colon with metastatic lymph  nodes for chemotherapy.   POSTOPERATIVE DIAGNOSIS:  Carcinoma of the right colon with metastatic lymph  nodes for chemotherapy.   PROCEDURE:  Right subclavian Davol Port-A-Cath placement.   SURGEON:  Thornton Park. Daphine Deutscher, M.D.   ANESTHESIA:  Local MAC.   DESCRIPTION OF PROCEDURE:  The patient is a 77 year old lady who was taken  to room 4 at [facility].  Preoperative informed consent was obtained  regarding placement of Port-A-Cath with potential complications.  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  We therefore went on the  right side and after prepping the chest with Betadine and draping sterilely,  I cannulated the right subclavian vein on the first pass and introduced a J-  wire which easily went into the right side of the heart.  This location was  noted by C-arm.  A pocket was made for the port down on the chest wall and  the preconnected Hickman-Davol Port-A-Cath was then placed in the pocket and  tunneled and brought out through the wire exit site.  It was then estimated  in proper length and cut.  It was flushed with heparin and then the  obturator and peel-away sheath were introduced and the obturator and wire  were removed and the catheter was then passed easily and the peel-away  sheath was removed in toto.  It was aspirated and then flushed with heparin  and its position confirmed with the C-arm.  It was then flushed  with  concentrated aqueous heparin.  The wounds were then closed with 4-0 Vicryl  with Benzoin and Steri-Strips and the patient was taken to the recovery room  in satisfactory condition.  She will have chemotherapy next week as per Dr.  Myna Hidalgo.                                               Thornton Park Daphine Deutscher, M.D.    MBM/MEDQ  D:  10/30/2001  T:  10/31/2001  Job:  [id number]   cc:   Rose Phi. Myna Hidalgo, M.D.  [address]  [place], [place] [address]  Fax: [phone]   Angus G. Renard Matter, M.D.

## 2010-06-07 ENCOUNTER — Emergency Department (HOSPITAL_COMMUNITY): Payer: Medicare Other

## 2010-06-07 ENCOUNTER — Emergency Department (HOSPITAL_COMMUNITY)
Admission: EM | Admit: 2010-06-07 | Discharge: 2010-06-07 | Disposition: A | Discharge status: Home / Self Care | Payer: Medicare Other | Attending: Emergency Medicine | Admitting: Emergency Medicine

## 2010-06-07 DIAGNOSIS — Z79899 Other long term (current) drug therapy: Secondary | ICD-10-CM | POA: Insufficient documentation

## 2010-06-07 DIAGNOSIS — E7800 Pure hypercholesterolemia, unspecified: Secondary | ICD-10-CM | POA: Insufficient documentation

## 2010-06-07 DIAGNOSIS — M1990 Unspecified osteoarthritis, unspecified site: Secondary | ICD-10-CM | POA: Insufficient documentation

## 2010-06-07 LAB — BASIC METABOLIC PANEL
CO2: 25 mEq/L (ref 19–32)
GFR calc Af Amer: >60 mL/min (ref >60)
GFR calc non Af Amer: >60 mL/min (ref >60)
Glucose, Bld: 111 mg/dL — ABNORMAL HIGH (ref 70–99)
Potassium: 3.7 mEq/L (ref 3.5–5.1)
Sodium: 139 mEq/L (ref 135–145)

## 2010-06-07 LAB — DIFFERENTIAL
Basophils Absolute: 0 10*3/uL (ref 0.0–0.1)
Basophils Relative: 0 % (ref 0–1)
Lymphocytes Relative: 25 % (ref 12–46)
Monocytes Absolute: 0.9 10*3/uL (ref 0.1–1.0)
Neutro Abs: 3.9 10*3/uL (ref 1.7–7.7)
Neutrophils Relative %: 58 % (ref 43–77)

## 2010-06-07 LAB — CBC
HCT: 42.2 % (ref 36.0–46.0)
Hemoglobin: 13.6 g/dL (ref 12.0–15.0)
MCHC: 32.2 g/dL (ref 30.0–36.0)
RBC: 4.07 MIL/uL (ref 3.87–5.11)

## 2010-06-07 LAB — URINALYSIS, ROUTINE W REFLEX MICROSCOPIC
Bilirubin Urine: NEGATIVE
Hgb urine dipstick: NEGATIVE
Nitrite: NEGATIVE
Specific Gravity, Urine: 1.01 (ref 1.005–1.030)
pH: 7.5 (ref 5.0–8.0)

## 2010-09-27 ENCOUNTER — Other Ambulatory Visit: Payer: Self-pay | Admitting: Oncology

## 2010-09-27 DIAGNOSIS — Z1231 Encounter for screening mammogram for malignant neoplasm of breast: Secondary | ICD-10-CM

## 2010-10-12 ENCOUNTER — Other Ambulatory Visit (HOSPITAL_COMMUNITY): Payer: Self-pay | Admitting: Gastroenterology

## 2010-10-12 DIAGNOSIS — R05 Cough: Secondary | ICD-10-CM

## 2010-10-12 DIAGNOSIS — T17998A Other foreign object in respiratory tract, part unspecified causing other injury, initial encounter: Secondary | ICD-10-CM

## 2010-10-12 DIAGNOSIS — E049 Nontoxic goiter, unspecified: Secondary | ICD-10-CM

## 2010-10-20 ENCOUNTER — Ambulatory Visit (HOSPITAL_COMMUNITY)
Admission: RE | Admit: 2010-10-20 | Discharge: 2010-10-20 | Disposition: A | Discharge status: Home / Self Care | Payer: Medicare Other | Source: Ambulatory Visit | Attending: Gastroenterology | Admitting: Gastroenterology

## 2010-10-20 DIAGNOSIS — R1310 Dysphagia, unspecified: Secondary | ICD-10-CM | POA: Insufficient documentation

## 2010-10-20 DIAGNOSIS — R05 Cough: Secondary | ICD-10-CM

## 2010-10-20 DIAGNOSIS — T17998A Other foreign object in respiratory tract, part unspecified causing other injury, initial encounter: Secondary | ICD-10-CM

## 2010-10-20 DIAGNOSIS — E049 Nontoxic goiter, unspecified: Secondary | ICD-10-CM

## 2010-10-20 DIAGNOSIS — R059 Cough, unspecified: Secondary | ICD-10-CM

## 2010-11-08 ENCOUNTER — Ambulatory Visit
Admission: RE | Admit: 2010-11-08 | Discharge: 2010-11-08 | Disposition: A | Discharge status: Home / Self Care | Payer: Medicare Other | Source: Ambulatory Visit | Attending: Oncology | Admitting: Oncology

## 2010-11-08 DIAGNOSIS — Z1231 Encounter for screening mammogram for malignant neoplasm of breast: Secondary | ICD-10-CM

## 2010-11-10 ENCOUNTER — Other Ambulatory Visit: Payer: Self-pay | Admitting: Oncology

## 2010-11-10 DIAGNOSIS — R928 Other abnormal and inconclusive findings on diagnostic imaging of breast: Secondary | ICD-10-CM

## 2010-11-26 ENCOUNTER — Ambulatory Visit
Admission: RE | Admit: 2010-11-26 | Discharge: 2010-11-26 | Disposition: A | Discharge status: Home / Self Care | Payer: Medicare Other | Source: Ambulatory Visit | Attending: Oncology | Admitting: Oncology

## 2010-11-26 DIAGNOSIS — R928 Other abnormal and inconclusive findings on diagnostic imaging of breast: Secondary | ICD-10-CM

## 2010-12-07 ENCOUNTER — Encounter: Payer: Self-pay | Admitting: Cardiology

## 2010-12-08 ENCOUNTER — Encounter (HOSPITAL_COMMUNITY): Payer: Self-pay | Admitting: Pharmacy Technician

## 2010-12-08 ENCOUNTER — Ambulatory Visit (INDEPENDENT_AMBULATORY_CARE_PROVIDER_SITE_OTHER): Payer: Medicare Other | Admitting: Cardiology

## 2010-12-08 ENCOUNTER — Encounter: Payer: Self-pay | Admitting: Cardiology

## 2010-12-08 VITALS — BP 147/77 | HR 91 | Ht 66.0 in | Wt 150.0 lb

## 2010-12-08 DIAGNOSIS — Z01818 Encounter for other preprocedural examination: Secondary | ICD-10-CM

## 2010-12-08 DIAGNOSIS — E785 Hyperlipidemia, unspecified: Secondary | ICD-10-CM

## 2010-12-08 DIAGNOSIS — I10 Essential (primary) hypertension: Secondary | ICD-10-CM

## 2010-12-08 DIAGNOSIS — Z01810 Encounter for preprocedural cardiovascular examination: Secondary | ICD-10-CM

## 2010-12-08 DIAGNOSIS — Z515 Encounter for palliative care: Secondary | ICD-10-CM | POA: Insufficient documentation

## 2010-12-08 NOTE — Assessment & Plan Note (Signed)
Multiple cardiac risk factors. Limited mobility and therefore functional capacity not easily assessed. Schedule Lexiscan Myoview. If normal or low risk she may proceed with knee replacement.

## 2010-12-08 NOTE — Progress Notes (Signed)
HPI: 77 year old female for preoperative evaluation prior to knee replacement. Echocardiogram in February of 2010 showed normal LV function. Patient has limited mobility because of knee pain. She does have dyspnea on exertion but denies orthopnea or PND. She has chronic pedal edema. She denies chest pain or syncope. Occasional brief palpitations that are self limited.  Current Outpatient Prescriptions  Medication Sig Dispense Refill  . amLODipine (NORVASC) 5 MG tablet Take 5 mg by mouth daily.        Marland Kitchen aspirin 325 MG tablet Take 325 mg by mouth daily.        . Calcium Carbonate (CALTRATE 600 PO) Take by mouth.        . Cholecalciferol (VITAMIN D-3) 5000 UNITS TABS Take by mouth 2 (two) times a week.        . diphenhydramine-acetaminophen (TYLENOL PM) 25-500 MG TABS Take 2 tablets by mouth at bedtime as needed.        Marland Kitchen estradiol (ESTRACE) 0.5 MG tablet Take 0.5 mg by mouth daily.        Marland Kitchen levothyroxine (SYNTHROID, LEVOTHROID) 112 MCG tablet Take 112 mcg by mouth daily.        . medroxyPROGESTERone (PROVERA) 5 MG tablet Take 2.5 mg by mouth daily.        . Multiple Vitamins-Minerals (MULTI COMPLETE PO) Take by mouth daily.        . niacin (NIASPAN) 1000 MG CR tablet Take 1,000 mg by mouth at bedtime.        . nortriptyline (PAMELOR) 25 MG capsule Take 25 mg by mouth 3 (three) times daily.        Marland Kitchen omeprazole (PRILOSEC) 20 MG capsule Take 20 mg by mouth daily.        . risedronate (ACTONEL) 150 MG tablet Take 150 mg by mouth every 30 (thirty) days. with water on empty stomach, nothing by mouth or lie down for next 30 minutes.       . simvastatin (ZOCOR) 40 MG tablet Take 40 mg by mouth at bedtime.        . vitamin C (ASCORBIC ACID) 500 MG tablet Take 500 mg by mouth daily.        . vitamin E 400 UNIT capsule Take 400 Units by mouth daily.        Marland Kitchen zolpidem (AMBIEN) 10 MG tablet Take 10 mg by mouth at bedtime as needed.          Allergies  Allergen Reactions  . Latex   . Sulfur     Past  Medical History  Diagnosis Date  . Hypertension   . Hypothyroid   . Goiter   . GERD (gastroesophageal reflux disease)   . Fibromyalgia   . Colon cancer     s/p resection    Past Surgical History  Procedure Date  . Tonsillectomy   . Tmj arthroplasty   . Back surgery   . Appendectomy   . Colon resection     History   Social History  . Marital Status: Divorced    Spouse Name: N/A    Number of Children: 1  . Years of Education: N/A   Occupational History  .      retired   Social History Main Topics  . Smoking status: Never Smoker   . Smokeless tobacco: Not on file  . Alcohol Use: No  . Drug Use: Not on file  . Sexually Active: Not on file   Other Topics Concern  . Not on  file   Social History Narrative  . No narrative on file    Family History  Problem Relation Age of Onset  . Heart disease Mother     Enlarged heart  . Arrhythmia Mother   . Diabetes Mother   . Stroke Sister   . Heart disease Brother     MI at unknown age  . Heart attack      MI at age 105  . Heart disease Brother     CABG    ROS: complains of right knee pain and chronic pedal edema but  no fevers or chills, productive cough, hemoptysis, dysphasia, odynophagia, melena, hematochezia, dysuria, hematuria, rash, seizure activity, orthopnea, PND, claudication. Remaining systems are negative.  Physical Exam:   Blood pressure 147/77, pulse 91, height 5\' 6"  (1.676 m), weight 150 lb (68.04 kg), SpO2 99.00%.  General:  Well developed/well nourished in NAD Skin warm/dry Patient not depressed No peripheral clubbing Back-normal HEENT-normal/normal eyelids Neck supple/normal carotid upstroke bilaterally; no bruits; no JVD; no thyromegaly chest - CTA/ normal expansion CV - RRR/normal S1 and S2; no murmurs or rubs; positive s4;  PMI nondisplaced Abdomen -NT/ND, no HSM, no mass, + bowel sounds, no bruit 2+ femoral pulses, no bruits Ext-trace edema, no chords, 2+ DP Neuro-grossly nonfocal  ECG  09/20/10 - sinus bradycardia at a rate of 57. Sinus arrhythmia. No significant ST changes.  NSR with no ST changes`

## 2010-12-08 NOTE — Assessment & Plan Note (Signed)
Continue present blood pressure medications. 

## 2010-12-08 NOTE — Assessment & Plan Note (Signed)
Management per primary care. 

## 2010-12-08 NOTE — Patient Instructions (Signed)
Your physician recommends that you schedule a follow-up appointment in: As needed  Your physician has requested that you have a lexiscan myoview. For further information please visit [email or web address]. Please follow instruction sheet, as given.   

## 2010-12-13 ENCOUNTER — Other Ambulatory Visit: Payer: Self-pay

## 2010-12-13 ENCOUNTER — Encounter: Payer: Self-pay | Admitting: Cardiology

## 2010-12-13 ENCOUNTER — Encounter: Payer: Medicare Other | Admitting: *Deleted

## 2010-12-13 DIAGNOSIS — Z01818 Encounter for other preprocedural examination: Secondary | ICD-10-CM

## 2010-12-15 ENCOUNTER — Encounter (HOSPITAL_COMMUNITY): Payer: Self-pay

## 2010-12-15 ENCOUNTER — Ambulatory Visit (HOSPITAL_COMMUNITY)
Admission: RE | Admit: 2010-12-15 | Discharge: 2010-12-15 | Disposition: A | Discharge status: Home / Self Care | Payer: Medicare Other | Source: Ambulatory Visit | Attending: Orthopedic Surgery | Admitting: Orthopedic Surgery

## 2010-12-15 ENCOUNTER — Encounter (HOSPITAL_COMMUNITY)
Admission: RE | Admit: 2010-12-15 | Discharge: 2010-12-15 | Disposition: A | Discharge status: Home / Self Care | Payer: Medicare Other | Source: Ambulatory Visit | Attending: Orthopedic Surgery | Admitting: Orthopedic Surgery

## 2010-12-15 DIAGNOSIS — I4891 Unspecified atrial fibrillation: Secondary | ICD-10-CM | POA: Insufficient documentation

## 2010-12-15 DIAGNOSIS — J42 Unspecified chronic bronchitis: Secondary | ICD-10-CM | POA: Insufficient documentation

## 2010-12-15 DIAGNOSIS — Z01812 Encounter for preprocedural laboratory examination: Secondary | ICD-10-CM | POA: Insufficient documentation

## 2010-12-15 DIAGNOSIS — Z01818 Encounter for other preprocedural examination: Secondary | ICD-10-CM | POA: Insufficient documentation

## 2010-12-15 DIAGNOSIS — M1710 Unilateral primary osteoarthritis, unspecified knee: Secondary | ICD-10-CM | POA: Insufficient documentation

## 2010-12-15 DIAGNOSIS — R011 Cardiac murmur, unspecified: Secondary | ICD-10-CM | POA: Insufficient documentation

## 2010-12-15 HISTORY — DX: Other specified postprocedural states: R11.2

## 2010-12-15 HISTORY — DX: Anemia, unspecified: D64.9

## 2010-12-15 HISTORY — DX: Other specified postprocedural states: Z98.890

## 2010-12-15 HISTORY — DX: Depression, unspecified: F32.A

## 2010-12-15 HISTORY — DX: Headache: R51

## 2010-12-15 HISTORY — DX: Peripheral vascular disease, unspecified: I73.9

## 2010-12-15 HISTORY — DX: Major depressive disorder, single episode, unspecified: F32.9

## 2010-12-15 HISTORY — DX: Cardiac murmur, unspecified: R01.1

## 2010-12-15 HISTORY — DX: Diaphragmatic hernia without obstruction or gangrene: K44.9

## 2010-12-15 LAB — URINE MICROSCOPIC-ADD ON

## 2010-12-15 LAB — DIFFERENTIAL
Eosinophils Absolute: 0.3 10*3/uL (ref 0.0–0.7)
Eosinophils Relative: 5 % (ref 0–5)
Lymphs Abs: 1.7 10*3/uL (ref 0.7–4.0)

## 2010-12-15 LAB — URINALYSIS, ROUTINE W REFLEX MICROSCOPIC
Ketones, ur: NEGATIVE mg/dL
Nitrite: NEGATIVE
Protein, ur: NEGATIVE mg/dL
Urobilinogen, UA: 0.2 mg/dL (ref 0.0–1.0)

## 2010-12-15 LAB — CBC
MCH: 34.1 pg — ABNORMAL HIGH (ref 26.0–34.0)
MCV: 104.9 fL — ABNORMAL HIGH (ref 78.0–100.0)
Platelets: 260 10*3/uL (ref 150–400)
RBC: 4.08 MIL/uL (ref 3.87–5.11)
RDW: 12 % (ref 11.5–15.5)

## 2010-12-15 LAB — COMPREHENSIVE METABOLIC PANEL
ALT: 15 U/L (ref 0–35)
BUN: 20 mg/dL (ref 6–23)
Calcium: 10.5 mg/dL (ref 8.4–10.5)
Creatinine, Ser: 0.87 mg/dL (ref 0.50–1.10)
GFR calc Af Amer: 74 mL/min — ABNORMAL LOW (ref >90)
Glucose, Bld: 152 mg/dL — ABNORMAL HIGH (ref 70–99)
Sodium: 138 mEq/L (ref 135–145)
Total Protein: 7.8 g/dL (ref 6.0–8.3)

## 2010-12-15 LAB — APTT: aPTT: 30 seconds (ref 24–37)

## 2010-12-15 LAB — ABO/RH: ABO/RH(D): A NEG

## 2010-12-15 LAB — PROTIME-INR
INR: 1 (ref 0.00–1.49)
Prothrombin Time: 13.4 seconds (ref 11.6–15.2)

## 2010-12-15 MED ORDER — CHLORHEXIDINE GLUCONATE 4 % EX LIQD: 60.0000 mL | Freq: Once | CUTANEOUS | Status: DC

## 2010-12-15 NOTE — H&P (Signed)
[name], [name] NO.:  [id number]  MEDICAL RECORD NO.:  [id number]  LOCATION:                               FACILITY:  [facility]  PHYSICIAN:  Sara Frankel. Charlann [name], M.D.  DATE OF BIRTH:  1932-08-23  DATE OF ADMISSION:  12/20/2010 DATE OF DISCHARGE:                             HISTORY & PHYSICAL   DATE OF SURGERY:  December 20, 2010.  ADMITTING DIAGNOSIS:  Osteoarthritis, right knee.  HISTORY OF PRESENT ILLNESS:  This is a 77 year old lady with a history of osteoarthritis of her right knee that has failed conservative management.  After discussion of treatments, benefits, risks, and options, the patient is scheduled for total knee arthroplasty of the right knee.  Note that her medical doctor is Dr. Megan [name], her cardiologist is Dr. Jens [name].  She is not a candidate for trans-cinnamic acid and will not receive that.  Preoperatively, she plans on going home after surgery, and she is given her medications of aspirin, Robaxin, iron, MiraLax, and Colace for postoperative period.  Note that she is scheduled to see a cardiologist this week for cardiology stress test, and pending that result, surgery will go ahead as scheduled.  DRUG ALLERGIES:  SULFA, LATEX, and ALL CAINES including MARCAINE and LIDOCAINE.  CURRENT MEDICATIONS: 1. Nortriptyline 25 mg 1 q.a.m., 2 q.p.m. 2. Levothyroxine 100 mcg 1 daily. 3. Actonel 150 mg once a month. 4. Estradiol 5 mg half tablet a day. 5. Medroxyprogesterone 5 mg 1/2 tablet daily, which she will stop in     the perioperative period. 6. Simvastatin 20 mg 1 daily. 7. Niaspan 1000 mg ER 1 daily. 8. Amlodipine 5 mg 1 daily. 9. Zolpidem 10 mg 1 nightly. 10.Omeprazole 20 mg 1 daily. 11.Polyethylene glycol 17 mg one daily.  MEDICAL ILLNESSES:  Include: 1. Hypertension. 2. Hyperlipidemia. 3. Hypertriglyceridemia. 4. Insomnia. 5. Osteopenia. 6. Hypothyroidism. 7. Depression. 8. Also the patient has a history of cervical degenerative  disk     disease and currently is under investigation for possibility of     atrial fibrillation.  PREVIOUS SURGERIES:  Include: 1. Appendectomy. 2. Tonsillectomy. 3. Colon resection for colon cancer with chemotherapy. 4. Back surgery. 5. Knee arthroscopy. 6. Bilateral cataract surgery. 7. TMJ surgery.  FAMILY HISTORY:  Positive for heart disease, stroke.  SOCIAL HISTORY:  The patient is divorced.  She is retired.  She does not smoke and does not drink.  She plans on going home after surgery.  REVIEW OF SYSTEMS:  CENTRAL NERVOUS SYSTEM:  Positive for anxiety, tinnitus, impaired hearing and vision with cataracts and macular degeneration.  PULMONARY:  Negative for sleep apnea.  Positive for exertional shortness of breath.  Negative for PND and orthopnea. CARDIOVASCULAR:  Positive for hypertension, history of a heart murmur, and currently workup for possible atrial fibrillation.  GI:  Positive for hiatal hernia and reflux.  Also positive for history of colon cancer with colon resection and chemotherapy.  GENITOURINARY:  Negative. MUSCULOSKELETAL:  Positive in HPI plus degenerative disk disease of the cervical spine and lumbar spin.  PHYSICAL EXAMINATION:  VITAL SIGNS:  BP 138/76, respirations 16, pulse 80 with occasional extrasystoles and weight is 68.2 kg. GENERAL APPEARANCE: This well-developed, well-nourished lady,  in no acute distress. HEENT:  Head is normocephalic.  Nose patent.  Ears patent.  Pupils equal, round, and reactive to light.  Throat without injection. NECK:  Supple without adenopathy.  Carotids 2+ without bruit. CHEST:  Clear to auscultation.  No rales or rhonchi.  Respirations 16. HEART:  Regular rate with occasional extrasystole with a 2/6 systolic ejection murmur. ABDOMEN:  Soft with active bowel sounds.  No masses, organomegaly. NEUROLOGIC:  Patient is alert and oriented to time, place, and person. Cranial nerves II through XII grossly intact. EXTREMITIES:   Shows the right knee with decreased range of motion with full extension and further flexion to about 60 degrees with increased pain.  Neurovascular status intact.  IMPRESSION:  Osteoarthritis, right knee.  PLAN:  Total knee arthroplasty, right knee, pending cardiology clearance and stress test.    [name], P.A.   ______________________________ [name], M.D.   SJC/MEDQ  D:  12/15/2010  T:  12/15/2010  Job:  [id number]

## 2010-12-15 NOTE — H&P (Signed)
H&P performed today, 12/15/10. Dictation # [id number]

## 2010-12-15 NOTE — Patient Instructions (Signed)
20 ESTY AHUJA  12/15/2010   Your procedure is scheduled on: 12/20/2010 1225pm-155pm  Report to [facility] at 1025 AM.  Call this number if you have problems the morning of surgery: [phone]   Remember:   Do not eat food:After Midnight.  May have clear liquids:until Midnight .  Clear liquids include soda, tea, black coffee, apple or grape juice, broth.  Take these medicines the morning of surgery with A SIP OF WATER:    Do not wear jewelry, make-up or nail polish.  Do not wear lotions, powders, or perfumes.    Do not shave 48 hours prior to surgery.  Do not bring valuables to the hospital.  Contacts, dentures or bridgework may not be worn into surgery.  Leave suitcase in the car. After surgery it may be brought to your room.  For patients admitted to the hospital, checkout time is 11:00 AM the day of discharge.      Special Instructions: CHG Shower Use Special Wash: 1/2 bottle night before surgery and 1/2 bottle morning of surgery. shower chin to toes with CHG.  Wash face and private parts with regular soap.    Please read over the following fact sheets that you were given: MRSA Information, Blood Transfusion Face Sheet , coughing and deep breathing exercises, leg exercises Incentive Spirometry Fact Sheet

## 2010-12-16 ENCOUNTER — Encounter (HOSPITAL_COMMUNITY)
Admission: RE | Admit: 2010-12-16 | Discharge: 2010-12-16 | Disposition: A | Discharge status: Home / Self Care | Payer: Medicare Other | Source: Ambulatory Visit | Attending: Cardiology | Admitting: Cardiology

## 2010-12-16 ENCOUNTER — Encounter (HOSPITAL_COMMUNITY): Payer: Self-pay | Admitting: Cardiology

## 2010-12-16 ENCOUNTER — Ambulatory Visit (INDEPENDENT_AMBULATORY_CARE_PROVIDER_SITE_OTHER): Payer: Medicare Other | Admitting: *Deleted

## 2010-12-16 DIAGNOSIS — Z01810 Encounter for preprocedural cardiovascular examination: Secondary | ICD-10-CM | POA: Insufficient documentation

## 2010-12-16 DIAGNOSIS — I10 Essential (primary) hypertension: Secondary | ICD-10-CM | POA: Insufficient documentation

## 2010-12-16 DIAGNOSIS — E785 Hyperlipidemia, unspecified: Secondary | ICD-10-CM | POA: Insufficient documentation

## 2010-12-16 DIAGNOSIS — R0602 Shortness of breath: Secondary | ICD-10-CM

## 2010-12-16 DIAGNOSIS — Z01818 Encounter for other preprocedural examination: Secondary | ICD-10-CM

## 2010-12-16 MED ORDER — TECHNETIUM TC 99M TETROFOSMIN IV KIT
30.0000 | PACK | Freq: Once | INTRAVENOUS | Status: AC | PRN
  Administered 2010-12-16: 30 via INTRAVENOUS

## 2010-12-16 MED ORDER — TECHNETIUM TC 99M TETROFOSMIN IV KIT
10.0000 | PACK | Freq: Once | INTRAVENOUS | Status: AC | PRN
  Administered 2010-12-16: 10 via INTRAVENOUS

## 2010-12-16 NOTE — Progress Notes (Signed)
Stress Lab Nurses Notes - Jeani Hawking  WANELL LORENZI 12/16/2010  Reason for doing test: Surgical Clearance Type of test: Steffanie Dunn Nurse performing test: Parke Poisson, RN Nuclear Medicine Tech: Lyndel Pleasure Echo Tech: Not Applicable MD performing test: Ival Bible & Joni Reining NP Family MD: [facility] Test explained and consent signed: yes IV started: 22g jelco, Saline lock flushed, No redness or edema and Saline lock started in radiology Symptoms: tightness in neck Treatment/Intervention: None Reason test stopped: protocol completed After recovery IV was: Discontinued via X-ray tech and No redness or edema Patient to return to Nuc. Med at : 12:55 pm Patient discharged: Home Patient's Condition upon discharge was: stable Comments: During test BP 132/78 & HR 109.  Symptoms resolved in recovery. Erskine Speed T

## 2010-12-17 ENCOUNTER — Telehealth: Payer: Self-pay | Admitting: Cardiology

## 2010-12-17 ENCOUNTER — Encounter: Payer: Self-pay | Admitting: *Deleted

## 2010-12-17 ENCOUNTER — Telehealth: Payer: Self-pay | Admitting: *Deleted

## 2010-12-17 NOTE — Pre-Procedure Instructions (Signed)
12/16/10 Stress test in EPIC.

## 2010-12-17 NOTE — Telephone Encounter (Signed)
CALLING OUR OFFICE PT SEEN HERE BY CRENSHAW AND A STRESS TEST WAS ORDERED. SHE HAD TEST YESTERDAY AND CRENSHAW HAS READ IT. THE OFFICE NEEDS CLEARANCE ASAP SURGERY IS MONDAY

## 2010-12-17 NOTE — Telephone Encounter (Signed)
Clearance letter forwarded to dr Joannie Springs

## 2010-12-17 NOTE — Telephone Encounter (Signed)
Pt was seen in [facility] office having surgery Monday and Dr. Lajoyce Corners Office needs surgical clearance.

## 2010-12-17 NOTE — Telephone Encounter (Signed)
Pt made aware that all necessary paperwork has been faxed to Dr Nilsa Nutting office

## 2010-12-20 ENCOUNTER — Encounter (HOSPITAL_COMMUNITY): Payer: Self-pay | Admitting: Anesthesiology

## 2010-12-20 ENCOUNTER — Inpatient Hospital Stay: Admit: 2010-12-20 | Payer: Self-pay | Admitting: Orthopedic Surgery

## 2010-12-20 ENCOUNTER — Inpatient Hospital Stay (HOSPITAL_COMMUNITY): Payer: Medicare Other | Admitting: Anesthesiology

## 2010-12-20 ENCOUNTER — Encounter (HOSPITAL_COMMUNITY)
Admission: RE | Disposition: A | Discharge status: Home Health | Payer: Self-pay | Source: Ambulatory Visit | Attending: Orthopedic Surgery

## 2010-12-20 ENCOUNTER — Inpatient Hospital Stay (HOSPITAL_COMMUNITY)
Admission: RE | Admit: 2010-12-20 | Discharge: 2010-12-23 | DRG: 470 | Disposition: A | Discharge status: Home Health | Payer: Medicare Other | Source: Ambulatory Visit | Attending: Orthopedic Surgery | Admitting: Orthopedic Surgery

## 2010-12-20 ENCOUNTER — Encounter (HOSPITAL_COMMUNITY): Payer: Self-pay | Admitting: *Deleted

## 2010-12-20 DIAGNOSIS — F329 Major depressive disorder, single episode, unspecified: Secondary | ICD-10-CM | POA: Diagnosis present

## 2010-12-20 DIAGNOSIS — M5030 Other cervical disc degeneration, unspecified cervical region: Secondary | ICD-10-CM | POA: Diagnosis present

## 2010-12-20 DIAGNOSIS — M899 Disorder of bone, unspecified: Secondary | ICD-10-CM | POA: Diagnosis present

## 2010-12-20 DIAGNOSIS — F3289 Other specified depressive episodes: Secondary | ICD-10-CM | POA: Diagnosis present

## 2010-12-20 DIAGNOSIS — E781 Pure hyperglyceridemia: Secondary | ICD-10-CM | POA: Diagnosis present

## 2010-12-20 DIAGNOSIS — Z96659 Presence of unspecified artificial knee joint: Secondary | ICD-10-CM

## 2010-12-20 DIAGNOSIS — I10 Essential (primary) hypertension: Secondary | ICD-10-CM

## 2010-12-20 DIAGNOSIS — E785 Hyperlipidemia, unspecified: Secondary | ICD-10-CM | POA: Diagnosis present

## 2010-12-20 DIAGNOSIS — M1710 Unilateral primary osteoarthritis, unspecified knee: Principal | ICD-10-CM | POA: Diagnosis present

## 2010-12-20 DIAGNOSIS — E039 Hypothyroidism, unspecified: Secondary | ICD-10-CM | POA: Diagnosis present

## 2010-12-20 HISTORY — PX: TOTAL KNEE ARTHROPLASTY: SHX125

## 2010-12-20 SURGERY — ARTHROPLASTY, KNEE, TOTAL
Anesthesia: Spinal | Laterality: Right

## 2010-12-20 SURGERY — ARTHROPLASTY, KNEE, TOTAL
Anesthesia: General | Site: Knee | Laterality: Right | Wound class: Clean

## 2010-12-20 MED ORDER — METOCLOPRAMIDE HCL 5 MG/ML IJ SOLN
INTRAMUSCULAR | Status: DC | PRN
  Administered 2010-12-20: 10 mg via INTRAVENOUS

## 2010-12-20 MED ORDER — PROPOFOL 10 MG/ML IV BOLUS
INTRAVENOUS | Status: DC | PRN
  Administered 2010-12-20: 110 mg via INTRAVENOUS

## 2010-12-20 MED ORDER — ROCURONIUM BROMIDE 100 MG/10ML IV SOLN
INTRAVENOUS | Status: DC | PRN
  Administered 2010-12-20: 45 mg via INTRAVENOUS

## 2010-12-20 MED ORDER — EPHEDRINE SULFATE 50 MG/ML IJ SOLN
INTRAMUSCULAR | Status: DC | PRN
  Administered 2010-12-20: 5 mg via INTRAVENOUS

## 2010-12-20 MED ORDER — VITAMIN D (ERGOCALCIFEROL) 1.25 MG (50000 UNIT) PO CAPS
50000.0000 [IU] | ORAL_CAPSULE | ORAL | Status: DC
  Filled 2010-12-20: qty 1

## 2010-12-20 MED ORDER — NORTRIPTYLINE HCL 25 MG PO CAPS
50.0000 mg | ORAL_CAPSULE | Freq: Every day | ORAL | Status: DC
  Administered 2010-12-20 – 2010-12-22 (×3): 50 mg via ORAL
  Filled 2010-12-20 (×4): qty 2

## 2010-12-20 MED ORDER — NEOSTIGMINE METHYLSULFATE 1 MG/ML IJ SOLN
INTRAMUSCULAR | Status: DC | PRN
  Administered 2010-12-20: 3 mg via INTRAVENOUS

## 2010-12-20 MED ORDER — HYDROMORPHONE HCL PF 1 MG/ML IJ SOLN
0.5000 mg | INTRAMUSCULAR | Status: DC | PRN
  Administered 2010-12-21 (×3): 1 mg via INTRAVENOUS
  Filled 2010-12-20 (×4): qty 1

## 2010-12-20 MED ORDER — ALUM & MAG HYDROXIDE-SIMETH 200-200-20 MG/5ML PO SUSP
30.0000 mL | ORAL | Status: DC | PRN
  Administered 2010-12-20: 30 mL via ORAL
  Filled 2010-12-20: qty 30

## 2010-12-20 MED ORDER — VITAMIN C 500 MG PO TABS
500.0000 mg | ORAL_TABLET | Freq: Every day | ORAL | Status: DC
  Administered 2010-12-21 – 2010-12-23 (×3): 500 mg via ORAL
  Filled 2010-12-20 (×4): qty 1

## 2010-12-20 MED ORDER — MEPERIDINE HCL 50 MG/ML IJ SOLN: 6.2500 mg | INTRAMUSCULAR | Status: DC | PRN

## 2010-12-20 MED ORDER — POLYVINYL ALCOHOL 1.4 % OP SOLN
1.0000 [drp] | Freq: Two times a day (BID) | OPHTHALMIC | Status: DC | PRN
  Filled 2010-12-20: qty 15

## 2010-12-20 MED ORDER — ESTRADIOL 0.5 MG PO TABS
0.2500 mg | ORAL_TABLET | Freq: Every day | ORAL | Status: DC
  Administered 2010-12-21 – 2010-12-23 (×3): 0.25 mg via ORAL
  Filled 2010-12-20 (×5): qty 1

## 2010-12-20 MED ORDER — VITAMIN E 180 MG (400 UNIT) PO CAPS
400.0000 [IU] | ORAL_CAPSULE | Freq: Two times a day (BID) | ORAL | Status: DC
  Administered 2010-12-21 – 2010-12-23 (×4): 400 [IU] via ORAL
  Filled 2010-12-20 (×8): qty 1

## 2010-12-20 MED ORDER — PANTOPRAZOLE SODIUM 40 MG PO TBEC
40.0000 mg | DELAYED_RELEASE_TABLET | Freq: Every day | ORAL | Status: DC
  Administered 2010-12-20 – 2010-12-23 (×4): 40 mg via ORAL
  Filled 2010-12-20 (×5): qty 1

## 2010-12-20 MED ORDER — METHOCARBAMOL 100 MG/ML IJ SOLN
500.0000 mg | Freq: Four times a day (QID) | INTRAVENOUS | Status: DC | PRN
  Filled 2010-12-20: qty 5

## 2010-12-20 MED ORDER — ACETAMINOPHEN 10 MG/ML IV SOLN
INTRAVENOUS | Status: DC | PRN
  Administered 2010-12-20: 1000 mg via INTRAVENOUS

## 2010-12-20 MED ORDER — AMLODIPINE BESYLATE 5 MG PO TABS
5.0000 mg | ORAL_TABLET | Freq: Every day | ORAL | Status: DC
  Administered 2010-12-21 – 2010-12-23 (×3): 5 mg via ORAL
  Filled 2010-12-20 (×3): qty 1

## 2010-12-20 MED ORDER — NIACIN ER 500 MG PO TBCR
1000.0000 mg | EXTENDED_RELEASE_TABLET | Freq: Every day | ORAL | Status: DC
  Administered 2010-12-20 – 2010-12-22 (×3): 1000 mg via ORAL
  Filled 2010-12-20 (×5): qty 2

## 2010-12-20 MED ORDER — ACETAMINOPHEN 325 MG PO TABS
650.0000 mg | ORAL_TABLET | Freq: Four times a day (QID) | ORAL | Status: DC | PRN
  Administered 2010-12-23: 650 mg via ORAL
  Filled 2010-12-20: qty 2

## 2010-12-20 MED ORDER — CEFAZOLIN SODIUM 1-5 GM-% IV SOLN
INTRAVENOUS | Status: DC | PRN
  Administered 2010-12-20: 1 g via INTRAVENOUS

## 2010-12-20 MED ORDER — ACETAMINOPHEN 650 MG RE SUPP: 650.0000 mg | Freq: Four times a day (QID) | RECTAL | Status: DC | PRN

## 2010-12-20 MED ORDER — ONDANSETRON HCL 4 MG/2ML IJ SOLN
4.0000 mg | Freq: Four times a day (QID) | INTRAMUSCULAR | Status: DC | PRN
  Administered 2010-12-21: 4 mg via INTRAVENOUS
  Filled 2010-12-20: qty 2

## 2010-12-20 MED ORDER — VITAMIN B-12 500 MCG PO TABS
500.0000 ug | ORAL_TABLET | Freq: Every day | ORAL | Status: DC
  Administered 2010-12-21 – 2010-12-23 (×3): 500 ug via ORAL
  Filled 2010-12-20 (×4): qty 1

## 2010-12-20 MED ORDER — GLYCOPYRROLATE 0.2 MG/ML IJ SOLN
INTRAMUSCULAR | Status: DC | PRN
  Administered 2010-12-20: .4 mg via INTRAVENOUS

## 2010-12-20 MED ORDER — CEFAZOLIN SODIUM 1-5 GM-% IV SOLN
1.0000 g | Freq: Four times a day (QID) | INTRAVENOUS | Status: AC
  Administered 2010-12-20 – 2010-12-21 (×3): 1 g via INTRAVENOUS
  Filled 2010-12-20 (×3): qty 50

## 2010-12-20 MED ORDER — SODIUM CHLORIDE 0.9 % IV SOLN
INTRAVENOUS | Status: DC
  Administered 2010-12-20 – 2010-12-22 (×4): via INTRAVENOUS
  Filled 2010-12-20 (×15): qty 1000

## 2010-12-20 MED ORDER — MIDAZOLAM HCL 5 MG/5ML IJ SOLN
INTRAMUSCULAR | Status: DC | PRN
  Administered 2010-12-20: 1 mg via INTRAVENOUS

## 2010-12-20 MED ORDER — SIMVASTATIN 20 MG PO TABS
20.0000 mg | ORAL_TABLET | Freq: Every day | ORAL | Status: DC
  Administered 2010-12-20 – 2010-12-22 (×3): 20 mg via ORAL
  Filled 2010-12-20 (×5): qty 1

## 2010-12-20 MED ORDER — METOCLOPRAMIDE HCL 5 MG/ML IJ SOLN: 5.0000 mg | Freq: Three times a day (TID) | INTRAMUSCULAR | Status: DC | PRN

## 2010-12-20 MED ORDER — ZOLPIDEM TARTRATE 10 MG PO TABS
10.0000 mg | ORAL_TABLET | Freq: Every evening | ORAL | Status: DC | PRN
  Administered 2010-12-20: 10 mg via ORAL
  Filled 2010-12-20: qty 1

## 2010-12-20 MED ORDER — ONDANSETRON HCL 4 MG/2ML IJ SOLN
INTRAMUSCULAR | Status: AC
  Administered 2010-12-20: 16:00:00
  Filled 2010-12-20: qty 2

## 2010-12-20 MED ORDER — NORTRIPTYLINE HCL 25 MG PO CAPS
25.0000 mg | ORAL_CAPSULE | Freq: Every day | ORAL | Status: DC
  Administered 2010-12-21 – 2010-12-23 (×3): 25 mg via ORAL
  Filled 2010-12-20 (×3): qty 1

## 2010-12-20 MED ORDER — DEXAMETHASONE SODIUM PHOSPHATE 10 MG/ML IJ SOLN
INTRAMUSCULAR | Status: DC | PRN
  Administered 2010-12-20: 4 mg via INTRAVENOUS

## 2010-12-20 MED ORDER — FENTANYL CITRATE 0.05 MG/ML IJ SOLN
INTRAMUSCULAR | Status: DC | PRN
  Administered 2010-12-20: 100 ug via INTRAVENOUS
  Administered 2010-12-20: 50 ug via INTRAVENOUS
  Administered 2010-12-20: 100 ug via INTRAVENOUS

## 2010-12-20 MED ORDER — LACTATED RINGERS IV SOLN
INTRAVENOUS | Status: DC | PRN
  Administered 2010-12-20 (×2): via INTRAVENOUS

## 2010-12-20 MED ORDER — NIACIN ER (ANTIHYPERLIPIDEMIC) 500 MG PO TBCR
1000.0000 mg | EXTENDED_RELEASE_TABLET | Freq: Every day | ORAL | Status: DC
  Filled 2010-12-20: qty 2

## 2010-12-20 MED ORDER — MENTHOL 3 MG MT LOZG: 1.0000 | LOZENGE | OROMUCOSAL | Status: DC | PRN

## 2010-12-20 MED ORDER — METHOCARBAMOL 500 MG PO TABS
500.0000 mg | ORAL_TABLET | Freq: Four times a day (QID) | ORAL | Status: DC | PRN
  Administered 2010-12-21 (×2): 500 mg via ORAL
  Filled 2010-12-20 (×2): qty 1

## 2010-12-20 MED ORDER — ASPIRIN 325 MG PO TABS
325.0000 mg | ORAL_TABLET | Freq: Every day | ORAL | Status: DC
  Administered 2010-12-20 – 2010-12-22 (×3): 325 mg via ORAL
  Filled 2010-12-20 (×4): qty 1

## 2010-12-20 MED ORDER — CEFAZOLIN SODIUM 1-5 GM-% IV SOLN: 1.0000 g | Freq: Once | INTRAVENOUS | Status: DC

## 2010-12-20 MED ORDER — ACETAMINOPHEN 500 MG PO TABS
500.0000 mg | ORAL_TABLET | Freq: Every day | ORAL | Status: DC
  Administered 2010-12-20 – 2010-12-23 (×4): 500 mg via ORAL
  Filled 2010-12-20 (×4): qty 1

## 2010-12-20 MED ORDER — MEDROXYPROGESTERONE ACETATE 2.5 MG PO TABS
2.5000 mg | ORAL_TABLET | Freq: Every day | ORAL | Status: DC
  Administered 2010-12-21 – 2010-12-23 (×3): 2.5 mg via ORAL
  Filled 2010-12-20 (×4): qty 1

## 2010-12-20 MED ORDER — MULTI COMPLETE PO CAPS: 1.0000 | ORAL_CAPSULE | Freq: Every day | ORAL | Status: DC

## 2010-12-20 MED ORDER — DEXAMETHASONE SODIUM PHOSPHATE 10 MG/ML IJ SOLN
10.0000 mg | Freq: Once | INTRAMUSCULAR | Status: AC
  Administered 2010-12-21: 10 mg via INTRAVENOUS
  Filled 2010-12-20: qty 1

## 2010-12-20 MED ORDER — POLYETHYLENE GLYCOL 3350 17 G PO PACK
17.0000 g | PACK | Freq: Every day | ORAL | Status: DC
  Administered 2010-12-21: 17 g via ORAL
  Filled 2010-12-20 (×3): qty 1

## 2010-12-20 MED ORDER — THERA M PLUS PO TABS
1.0000 | ORAL_TABLET | Freq: Every day | ORAL | Status: DC
  Administered 2010-12-21 – 2010-12-23 (×3): 1 via ORAL
  Filled 2010-12-20 (×4): qty 1

## 2010-12-20 MED ORDER — SODIUM CHLORIDE 0.9 % IV SOLN
INTRAVENOUS | Status: DC
  Administered 2010-12-20: 15:00:00 via INTRAVENOUS

## 2010-12-20 MED ORDER — LEVOTHYROXINE SODIUM 112 MCG PO TABS
112.0000 ug | ORAL_TABLET | ORAL | Status: DC
  Administered 2010-12-21 – 2010-12-23 (×3): 112 ug via ORAL
  Filled 2010-12-20 (×3): qty 1

## 2010-12-20 MED ORDER — ONDANSETRON HCL 4 MG/2ML IJ SOLN
INTRAMUSCULAR | Status: DC | PRN
  Administered 2010-12-20: 4 mg via INTRAVENOUS

## 2010-12-20 MED ORDER — PHENOL 1.4 % MT LIQD: 1.0000 | OROMUCOSAL | Status: DC | PRN

## 2010-12-20 MED ORDER — DOCUSATE SODIUM 100 MG PO CAPS
100.0000 mg | ORAL_CAPSULE | Freq: Two times a day (BID) | ORAL | Status: DC
  Administered 2010-12-20 – 2010-12-23 (×6): 100 mg via ORAL
  Filled 2010-12-20 (×7): qty 1

## 2010-12-20 MED ORDER — POLYETHYLENE GLYCOL 3350 17 G PO PACK
17.0000 g | PACK | Freq: Two times a day (BID) | ORAL | Status: DC
  Administered 2010-12-20 – 2010-12-23 (×6): 17 g via ORAL
  Filled 2010-12-20 (×7): qty 1

## 2010-12-20 MED ORDER — FERROUS SULFATE 325 (65 FE) MG PO TABS
325.0000 mg | ORAL_TABLET | Freq: Three times a day (TID) | ORAL | Status: DC
  Administered 2010-12-20 – 2010-12-23 (×9): 325 mg via ORAL
  Filled 2010-12-20 (×10): qty 1

## 2010-12-20 MED ORDER — ONDANSETRON HCL 4 MG PO TABS: 4.0000 mg | ORAL_TABLET | Freq: Four times a day (QID) | ORAL | Status: DC | PRN

## 2010-12-20 MED ORDER — METOCLOPRAMIDE HCL 10 MG PO TABS: 5.0000 mg | ORAL_TABLET | Freq: Three times a day (TID) | ORAL | Status: DC | PRN

## 2010-12-20 MED ORDER — HYDROMORPHONE HCL PF 1 MG/ML IJ SOLN
INTRAMUSCULAR | Status: DC | PRN
  Administered 2010-12-20 (×2): 1 mg via INTRAVENOUS

## 2010-12-20 MED ORDER — RIVAROXABAN 10 MG PO TABS
10.0000 mg | ORAL_TABLET | Freq: Every day | ORAL | Status: DC
  Administered 2010-12-21 – 2010-12-23 (×3): 10 mg via ORAL
  Filled 2010-12-20 (×3): qty 1

## 2010-12-20 MED ORDER — POLYETHYL GLYCOL-PROPYL GLYCOL 0.4-0.3 % OP SOLN: 1.0000 [drp] | Freq: Two times a day (BID) | OPHTHALMIC | Status: DC | PRN

## 2010-12-20 MED ORDER — CHLORHEXIDINE GLUCONATE 4 % EX LIQD: 60.0000 mL | Freq: Once | CUTANEOUS | Status: DC

## 2010-12-20 MED ORDER — HYDROCODONE-ACETAMINOPHEN 7.5-325 MG PO TABS
1.0000 | ORAL_TABLET | ORAL | Status: DC | PRN
  Administered 2010-12-20 – 2010-12-21 (×2): 1 via ORAL
  Filled 2010-12-20 (×2): qty 1

## 2010-12-20 MED ORDER — HYDROMORPHONE HCL PF 1 MG/ML IJ SOLN
0.2500 mg | INTRAMUSCULAR | Status: DC | PRN
  Administered 2010-12-20: 0.25 mg via INTRAVENOUS

## 2010-12-20 MED ORDER — LACTATED RINGERS IV SOLN: INTRAVENOUS | Status: DC

## 2010-12-20 MED ORDER — DEXAMETHASONE SODIUM PHOSPHATE 10 MG/ML IJ SOLN: 8.0000 mg | Freq: Once | INTRAMUSCULAR | Status: DC | PRN

## 2010-12-20 MED ORDER — SODIUM CHLORIDE 0.9 % IR SOLN
Status: DC | PRN
  Administered 2010-12-20: 1000 mL

## 2010-12-20 SURGICAL SUPPLY — 55 items
BAG ZIPLOCK 12X15 (MISCELLANEOUS) ×2 IMPLANT
BANDAGE ELASTIC 6 VELCRO ST LF (GAUZE/BANDAGES/DRESSINGS) ×2 IMPLANT
BANDAGE ESMARK 6X9 LF (GAUZE/BANDAGES/DRESSINGS) ×1 IMPLANT
BLADE SAW SGTL 13.0X1.19X90.0M (BLADE) ×2 IMPLANT
BNDG ESMARK 6X9 LF (GAUZE/BANDAGES/DRESSINGS) ×2
BOWL SMART MIX CTS (DISPOSABLE) ×2 IMPLANT
CATH FOLEY LATEX FREE 16FR (CATHETERS) ×2 IMPLANT
CEMENT HV SMART SET (Cement) ×2 IMPLANT
CLOTH BEACON ORANGE TIMEOUT ST (SAFETY) ×2 IMPLANT
CUFF TOURN SGL QUICK 34 (TOURNIQUET CUFF) ×1
CUFF TRNQT CYL 34X4X40X1 (TOURNIQUET CUFF) ×1 IMPLANT
DECANTER SPIKE VIAL GLASS SM (MISCELLANEOUS) IMPLANT
DERMABOND ADVANCED (GAUZE/BANDAGES/DRESSINGS) ×1
DERMABOND ADVANCED .7 DNX12 (GAUZE/BANDAGES/DRESSINGS) ×1 IMPLANT
DRAPE EXTREMITY T 121X128X90 (DRAPE) ×2 IMPLANT
DRAPE POUCH INSTRU U-SHP 10X18 (DRAPES) ×2 IMPLANT
DRAPE U-SHAPE 47X51 STRL (DRAPES) ×2 IMPLANT
DRSG AQUACEL AG ADV 3.5X10 (GAUZE/BANDAGES/DRESSINGS) ×2 IMPLANT
DRSG TEGADERM 4X4.75 (GAUZE/BANDAGES/DRESSINGS) ×2 IMPLANT
DURAPREP 26ML APPLICATOR (WOUND CARE) ×2 IMPLANT
ELECT REM PT RETURN 9FT ADLT (ELECTROSURGICAL) ×2
ELECTRODE REM PT RTRN 9FT ADLT (ELECTROSURGICAL) ×1 IMPLANT
EVACUATOR 1/8 PVC DRAIN (DRAIN) ×2 IMPLANT
FACESHIELD LNG OPTICON STERILE (SAFETY) ×10 IMPLANT
GAUZE SPONGE 2X2 8PLY STRL LF (GAUZE/BANDAGES/DRESSINGS) ×1 IMPLANT
GLOVE BIOGEL PI IND STRL 7.5 (GLOVE) ×1 IMPLANT
GLOVE BIOGEL PI IND STRL 8 (GLOVE) ×1 IMPLANT
GLOVE BIOGEL PI INDICATOR 7.5 (GLOVE) ×1
GLOVE BIOGEL PI INDICATOR 8 (GLOVE) ×1
GLOVE SURG SS PI 7.5 STRL IVOR (GLOVE) ×4 IMPLANT
GLOVE SURG SS PI 8.0 STRL IVOR (GLOVE) ×2 IMPLANT
GOWN PREVENTION PLUS XXLARGE (GOWN DISPOSABLE) ×2 IMPLANT
GOWN STRL NON-REIN LRG LVL3 (GOWN DISPOSABLE) ×4 IMPLANT
HANDPIECE INTERPULSE COAX TIP (DISPOSABLE) ×1
IMMOBILIZER KNEE 20 (SOFTGOODS) ×2
IMMOBILIZER KNEE 20 THIGH 36 (SOFTGOODS) ×1 IMPLANT
KIT BASIN OR (CUSTOM PROCEDURE TRAY) ×2 IMPLANT
MANIFOLD NEPTUNE II (INSTRUMENTS) ×2 IMPLANT
NDL SAFETY ECLIPSE 18X1.5 (NEEDLE) ×1 IMPLANT
NEEDLE HYPO 18GX1.5 SHARP (NEEDLE) ×1
NS IRRIG 1000ML POUR BTL (IV SOLUTION) ×2 IMPLANT
PACK TOTAL JOINT (CUSTOM PROCEDURE TRAY) ×2 IMPLANT
POSITIONER SURGICAL ARM (MISCELLANEOUS) ×2 IMPLANT
SET HNDPC FAN SPRY TIP SCT (DISPOSABLE) ×1 IMPLANT
SET PAD KNEE POSITIONER (MISCELLANEOUS) ×2 IMPLANT
SPONGE GAUZE 2X2 STER 10/PKG (GAUZE/BANDAGES/DRESSINGS) ×1
SUCTION FRAZIER 12FR DISP (SUCTIONS) ×2 IMPLANT
SUT MNCRL AB 4-0 PS2 18 (SUTURE) ×2 IMPLANT
SUT VIC AB 1 CT1 36 (SUTURE) ×6 IMPLANT
SUT VIC AB 2-0 CT1 27 (SUTURE) ×3
SUT VIC AB 2-0 CT1 TAPERPNT 27 (SUTURE) ×3 IMPLANT
SYR 50ML LL SCALE MARK (SYRINGE) ×2 IMPLANT
TOWEL OR 17X26 10 PK STRL BLUE (TOWEL DISPOSABLE) ×4 IMPLANT
WATER STERILE IRR 1500ML POUR (IV SOLUTION) ×2 IMPLANT
WRAP KNEE MAXI GEL POST OP (GAUZE/BANDAGES/DRESSINGS) ×2 IMPLANT

## 2010-12-20 NOTE — Anesthesia Preprocedure Evaluation (Signed)
Anesthesia Evaluation    History of Anesthesia Complications (+) PONV  Airway Mallampati: II TM Distance: >3 FB Neck ROM: Full    Dental No notable dental hx.    Pulmonary  clear to auscultation  Pulmonary exam normal       Cardiovascular hypertension, Regular Normal    Neuro/Psych  Neuromuscular disease    GI/Hepatic hiatal hernia, GERD-  ,  Endo/Other  Hypothyroidism   Renal/GU      Musculoskeletal  (+) Fibromyalgia -  Abdominal   Peds  Hematology   Anesthesia Other Findings   Reproductive/Obstetrics                           Anesthesia Physical Anesthesia Plan  ASA: II  Anesthesia Plan: General   Post-op Pain Management:    Induction: Intravenous  Airway Management Planned: Oral ETT  Additional Equipment:   Intra-op Plan:   Post-operative Plan: Extubation in OR  Informed Consent: I have reviewed the patients History and Physical, chart, labs and discussed the procedure including the risks, benefits and alternatives for the proposed anesthesia with the patient or authorized representative who has indicated his/her understanding and acceptance.   Dental advisory given  Plan Discussed with: CRNA  Anesthesia Plan Comments:         Anesthesia Quick Evaluation

## 2010-12-20 NOTE — Transfer of Care (Signed)
Immediate Anesthesia Transfer of Care Note  Patient: [name]  Procedure(s) Performed:  TOTAL KNEE ARTHROPLASTY  Patient Location: PACU  Anesthesia Type: General  Level of Consciousness: awake, oriented and sedated  Airway & Oxygen Therapy: Patient Spontanous Breathing and Patient connected to face mask oxygen  Post-op Assessment: Report given to PACU RN  Post vital signs: Reviewed and stable  Complications: No apparent anesthesia complications

## 2010-12-20 NOTE — Transfer of Care (Signed)
Immediate Anesthesia Transfer of Care Note  Patient: [name]  Procedure(s) Performed:  TOTAL KNEE ARTHROPLASTY  Patient Location: PACU  Anesthesia Type: General  Level of Consciousness: awake, oriented and sedated  Airway & Oxygen Therapy: Patient Spontanous Breathing and Patient connected to face mask oxygen  Post-op Assessment: Report given to PACU RN, Post -op Vital signs reviewed and stable and Patient moving all extremities  Post vital signs: Reviewed and stable  Complications: No apparent anesthesia complications

## 2010-12-20 NOTE — Op Note (Signed)
NAME:  [name]                      MEDICAL RECORD NO.:  [id number]                             FACILITY:  [facility]      PHYSICIAN:  Madlyn Frankel. Charlann Boxer, M.D.  DATE OF BIRTH:  December 21, 1932      DATE OF PROCEDURE:  12/20/2010                                     OPERATIVE REPORT         PREOPERATIVE DIAGNOSIS:  Right knee osteoarthritis.      POSTOPERATIVE DIAGNOSIS:  Right knee osteoarthritis.      FINDINGS:  The patient was noted to have complete loss of cartilage and   bone-on-bone arthritis with associated osteophytes in the medial and patello-femoral compartments of   the knee with a significant synovitis and associated effusion.      PROCEDURE:  Right total knee replacement.      COMPONENTS USED:  DePuy rotating platform posterior stabilized knee   system, a size 4N femur, 3 tibia, 12.5 mm insert, and 35 patellar   button.      SURGEON:  Madlyn Frankel. Charlann Boxer, M.D.      ASSISTANT:  Lanney Gins, PA-C.      ANESTHESIA:  General.      SPECIMENS:  None.      COMPLICATION:  None.      DRAINS:  One Hemovac.  EBL: minimal      TOURNIQUET TIME:   Total Tourniquet Time Documented: Thigh (Right) - 28 minutes .      The patient was stable to the recovery room.      INDICATION FOR PROCEDURE:  [name] is a 77 y.o. female patient of   mine.  The patient had been seen, evaluated, and treated conservatively in the   office with medication, activity modification, and injections.  The patient had   radiographic changes of bone-on-bone arthritis with endplate sclerosis and osteophytes noted.      The patient failed conservative measures including medication, injections, and activity modification, and at this point was ready for more definitive measures.   Based on the radiographic changes and failed conservative measures, the patient   decided to proceed with total knee replacement.  Risks of infection,   DVT, component failure, need for revision surgery, postop  course, and   expectations were all   discussed and reviewed.  Consent was obtained for benefit of pain   relief.      PROCEDURE IN DETAIL:  The patient was brought to the operative theater.   Once adequate anesthesia, preoperative antibiotics, 2 gm of Ancef administered, the patient was positioned supine with the right thigh tourniquet placed.  The  right lower extremity was prepped and draped in sterile fashion.  A time-   out was performed identifying the patient, planned procedure, and   extremity.      The right lower extremity was placed in the [facility] leg holder.  The leg was   exsanguinated, tourniquet elevated to 250 mmHg.  A midline incision was   made followed by median parapatellar arthrotomy.  Following initial   exposure, attention was first directed to the patella.  Precut  measurement was noted to be 24 mm.  I resected down to 14 mm and used a   35 patellar button to restore patellar height as well as cover the cut   surface.      The lug holes were drilled and a metal shim was placed to protect the   patella from retractors and saw blades.      At this point, attention was now directed to the femur.  The femoral   canal was opened with a drill, irrigated to try to prevent fat emboli.  An   intramedullary rod was passed at 5 degrees valgus, 10 mm of bone was   resected off the distal femur.  Following this resection, the tibia was   subluxated anteriorly.  Using the extramedullary guide, 8 mm of bone was resected off   the proximal lateral tibia.  We confirmed the gap would be   stable medially and laterally with a 10 mm insert as well as confirmed   the cut was perpendicular in the coronal plane, checking with an alignment rod.      Once this was done, I sized the femur to be a size 4 in the anterior-   posterior dimension, chose a 4 narrow component based on medial and   lateral dimension.  The size 4 rotation block was then pinned in   position anterior referenced  using the C-clamp to set rotation.  The   anterior, posterior, and  chamfer cuts were made without difficulty nor   notching making certain that I was along the anterior cortex to help   with flexion gap stability.      The final box cut was made off the lateral aspect of distal femur.      At this point, the tibia was sized to be a size 3, the size 3 tray was   then pinned in position through the medial third of the tubercle,   drilled, and keel punched.  Trial reduction was now carried with a 4N femur,  3 tibia, a 12.5 mm insert, and the 35 patella botton.  The knee was brought to   extension, full extension with good flexion stability with the patella   tracking through the trochlea without application of pressure.  Given   all these findings, the trial components removed.  Final components were   opened and cement was mixed.  The knee was irrigated with normal saline   solution and pulse lavage.  The synovial lining was   then injected with 0.25% Marcaine with epinephrine and 1 cc of Toradol,   total of 61 cc.      The knee was irrigated.  Final implants were then cemented onto clean and   dried cut surfaces of bone with the knee brought to extension with a 12.5   mm trial insert.      Once the cement had fully cured, the excess cement was removed   throughout the knee.  I confirmed I was satisfied with the range of   motion and stability, and the final 12.5 mm insert was chosen.  It was   placed into the knee.      The tourniquet had been let down at 26 minutes.  No significant   hemostasis required.  The medium Hemovac drain was placed deep.  The   extensor mechanism was then reapproximated using #1 Vicryl with the knee   in flexion.  The   remaining wound was closed with 2-0  Vicryl and running 4-0 Monocryl.   The knee was cleaned, dried, dressed sterilely using Dermabond and   Aquacel dressing.  Drain site dressed separately.  The patient was then   brought to recovery room  in stable condition, tolerating the procedure   well.   Please note that Physician Assistant, Lanney Gins, was present for the entirety of the case, and was utilized for pre-operative positioning, peri-operative retractor management, general facilitation of the procedure.  He was also utilized for primary wound closure at the end of the case.              Madlyn Frankel Charlann Boxer, M.D.

## 2010-12-20 NOTE — Interval H&P Note (Signed)
History and Physical Interval Note:  12/20/2010 10:04 AM  [name]  has presented today for surgery, with the diagnosis of right knee osteoarthritis  The various methods of treatment have been discussed with the patient and family. After consideration of risks, benefits and other options for treatment, the patient has consented to  Procedure(s): RIGHT TOTAL KNEE ARTHROPLASTY as a surgical intervention .  The patients' history has been reviewed, patient examined, no change in status, stable for surgery.  I have reviewed the patients' chart and labs.  Questions were answered to the patient's satisfaction.     Shelda Pal

## 2010-12-21 ENCOUNTER — Encounter (HOSPITAL_COMMUNITY): Payer: Self-pay | Admitting: Orthopedic Surgery

## 2010-12-21 LAB — BASIC METABOLIC PANEL
CO2: 27 mEq/L (ref 19–32)
Calcium: 8.4 mg/dL (ref 8.4–10.5)
Creatinine, Ser: 0.64 mg/dL (ref 0.50–1.10)
GFR calc Af Amer: >90 mL/min (ref >90)
GFR calc non Af Amer: 85 mL/min — ABNORMAL LOW (ref >90)

## 2010-12-21 LAB — CBC
MCH: 34.6 pg — ABNORMAL HIGH (ref 26.0–34.0)
MCHC: 33.6 g/dL (ref 30.0–36.0)
MCV: 102.8 fL — ABNORMAL HIGH (ref 78.0–100.0)
Platelets: 222 10*3/uL (ref 150–400)
RDW: 11.7 % (ref 11.5–15.5)

## 2010-12-21 MED ORDER — TRAMADOL HCL 50 MG PO TABS
50.0000 mg | ORAL_TABLET | Freq: Four times a day (QID) | ORAL | Status: DC
  Administered 2010-12-21 – 2010-12-23 (×9): 50 mg via ORAL
  Administered 2010-12-23: 100 mg via ORAL
  Filled 2010-12-21 (×3): qty 2
  Filled 2010-12-21: qty 1
  Filled 2010-12-21 (×3): qty 2
  Filled 2010-12-21: qty 1
  Filled 2010-12-21: qty 2
  Filled 2010-12-21: qty 1
  Filled 2010-12-21: qty 2
  Filled 2010-12-21 (×2): qty 1

## 2010-12-21 MED ORDER — KETOROLAC TROMETHAMINE 15 MG/ML IJ SOLN
7.5000 mg | Freq: Four times a day (QID) | INTRAMUSCULAR | Status: AC
  Administered 2010-12-21 – 2010-12-23 (×8): 7.5 mg via INTRAVENOUS
  Filled 2010-12-21 (×8): qty 1

## 2010-12-21 NOTE — Progress Notes (Signed)
Utilization review completed.  

## 2010-12-21 NOTE — Progress Notes (Signed)
Physical Therapy Evaluation Patient Details Name: [name] MRN: [id number] DOB: [birth year] Today's Date: 12/21/2010 0845 - 0923, EVAL, EX Problem List:  Patient Active Problem List  Diagnoses  . Preop cardiovascular exam  . Hypertension  . Hyperlipidemia    Past Medical History:  Past Medical History  Diagnosis Date  . Hypertension   . Hypothyroid   . Goiter   . GERD (gastroesophageal reflux disease)   . Fibromyalgia   . PONV (postoperative nausea and vomiting)   . Heart murmur   . Peripheral vascular disease     varicose veins in left arm   . Shortness of breath     with exertion   . Anemia     hx of in 2003   . Hiatal hernia   . Headache     tension headache daily per pt   . Arthritis   . Colon cancer     s/p resecton of colon in 2003   . Depression    Past Surgical History:  Past Surgical History  Procedure Date  . Tonsillectomy   . Tmj arthroplasty   . Back surgery   . Appendectomy   . Colon resection   . Eye surgery     bilateral cataract surgery     PT Assessment/Plan/Recommendation PT Assessment Clinical Impression Statement: Pt with R TKR with decreased strength/ROM and decreased functional mobility.  Pt will benefit from skilled PT intervention to maximize IND for d/c home. PT Recommendation/Assessment: Patient will need skilled PT in the acute care venue PT Problem List: Decreased strength;Decreased range of motion;Decreased activity tolerance;Decreased coordination;Decreased mobility;Decreased knowledge of use of DME;Pain PT Therapy Diagnosis : Difficulty walking PT Plan PT Frequency: 7X/week PT Treatment/Interventions: DME instruction;Gait training;Stair training;Functional mobility training;Therapeutic activities;Therapeutic exercise;Patient/family education PT Recommendation Recommendations for Other Services: OT consult Follow Up Recommendations: Home health PT Equipment Recommended: None recommended by PT PT Goals  Acute Rehab PT  Goals PT Goal Formulation: With patient Time For Goal Achievement: 7 days Pt will go Supine/Side to Sit: with supervision PT Goal: Supine/Side to Sit - Progress: Not met Pt will go Sit to Supine/Side: with supervision PT Goal: Sit to Supine/Side - Progress: Not met Pt will go Sit to Stand: with supervision PT Goal: Sit to Stand - Progress: Not met Pt will Ambulate: 51 - 150 feet;with rolling walker;with supervision PT Goal: Ambulate - Progress: Not met Pt will Go Up / Down Stairs: 3-5 stairs;with min assist;with least restrictive assistive device PT Goal: Up/Down Stairs - Progress: Not met  PT Evaluation Precautions/Restrictions  Precautions Precautions: Knee Required Braces or Orthoses: Yes Knee Immobilizer: Discontinue once straight leg raise with < 10 degree lag Restrictions Weight Bearing Restrictions: No RLE Weight Bearing: Weight bearing as tolerated Prior Functioning  Home Living Lives With: Sheran Spine Help From: Family Type of Home: Mobile home Home Layout: One level Home Access: Stairs to enter Entrance Stairs-Rails: Right Entrance Stairs-Number of Steps: 5 Prior Function Level of Independence: Independent with basic ADLs;Independent with gait;Independent with transfers (used RW or crutches on bad days) Able to Take Stairs?: Yes Cognition Cognition Arousal/Alertness: Awake/alert Overall Cognitive Status: Appears within functional limits for tasks assessed Orientation Level: Oriented X4 Sensation/Coordination Coordination Gross Motor Movements are Fluid and Coordinated: Yes Extremity Assessment RUE Assessment RUE Assessment: Within Functional Limits LUE Assessment LUE Assessment: Within Functional Limits RLE Assessment RLE Assessment: Exceptions to [facility] RLE PROM (degrees) Overall PROM Right Lower Extremity: Due to pain (-10 - 30%) RLE Strength RLE Overall Strength: Due  to pain (2+/5 quads) LLE Assessment LLE Assessment: Within Functional Limits Mobility  (including Balance) Bed Mobility Bed Mobility: Yes Supine to Sit: 3: Mod assist Transfers Transfers: Yes Sit to Stand: 1: +2 Total assist;With upper extremity assist;From bed Sit to Stand Details (indicate cue type and reason): cues for use of UEs and LE position (pt 70%) Stand to Sit: 1: +2 Total assist Stand to Sit Details: cues for use of UEs and LE position (pt 70%) Ambulation/Gait Ambulation/Gait: Yes Ambulation/Gait Assistance: 1: +2 Total assist Ambulation/Gait Assistance Details (indicate cue type and reason): pt 60% with cues for sequence, posture, position from RW and increased L LE WB - pt walking on toes on L with min WB tolerated Ambulation Distance (Feet): 7 Feet Assistive device: Rolling walker Gait Pattern: Step-to pattern    Exercise  Total Joint Exercises Ankle Circles/Pumps: AROM;10 reps;Supine;Both Quad Sets: AROM;Both;10 reps;Supine Heel Slides: AAROM;Right;10 reps;Supine Straight Leg Raises: AAROM;10 reps;Supine;Right End of Session PT - End of Session Equipment Utilized During Treatment: Gait belt Activity Tolerance: Patient limited by fatigue;Patient limited by pain Patient left: in chair;with call bell in reach General Behavior During Session: [facility] for tasks performed Cognition: [facility] for tasks performed  Lashun Mccants 12/21/2010, 11:31 AM

## 2010-12-21 NOTE — Progress Notes (Signed)
Subjective: 1 Day Post-Op Procedure(s) (LRB): TOTAL KNEE ARTHROPLASTY (Right)   Patient reports pain as mild. However complaining of nausea, believe it is from the pain medicine.  Objective:   VITALS:   Filed Vitals:   12/21/10 0600  BP: 119/72  Pulse: 102  Temp: 97.7 F (36.5 C)  Resp: 16    Neurovascular intact Dorsiflexion/Plantar flexion intact Incision: dressing C/D/I No cellulitis present Compartment soft  LABS  Basename 12/21/10 0420  HGB 11.0*  HCT 32.7*  WBC 14.5*  PLT 222     Basename 12/21/10 0420  NA 133*  K 4.2  BUN 12  CREATININE 0.64  GLUCOSE 176*    Assessment/Plan: 1 Day Post-Op Procedure(s) (LRB): TOTAL KNEE ARTHROPLASTY (Right)  HV drain dc'ed Foley cath dc'ed Changed Norco to tramadol Added toradol Advance diet Up with therapy Discharge home with home health when discharged   [name]. [name]   PAC  12/21/2010, 7:40 AM

## 2010-12-21 NOTE — Progress Notes (Signed)
Physical Therapy Treatment Patient Details Name: ELSBETH YEARICK MRN: [id number] DOB: [birth year] Today's Date: 12/21/2010 1517 - 1540  2GT PT Assessment/Plan  PT - Assessment/Plan PT Frequency: 7X/week Follow Up Recommendations: Home health PT Equipment Recommended: None recommended by PT PT Goals  Acute Rehab PT Goals PT Goal: Sit to Supine/Side - Progress: Progressing toward goal PT Goal: Sit to Stand - Progress: Progressing toward goal PT Goal: Ambulate - Progress: Progressing toward goal  PT Treatment Precautions/Restrictions  Precautions Precautions: Knee Required Braces or Orthoses: Yes Knee Immobilizer: Discontinue once straight leg raise with < 10 degree lag Restrictions Weight Bearing Restrictions: No RLE Weight Bearing: Weight bearing as tolerated Mobility (including Balance) Bed Mobility Supine to Sit: 3: Mod assist;4: Min assist Sit to Supine - Right: 3: Mod assist;4: Min assist Transfers Sit to Stand: 1: +2 Total assist;From bed;With upper extremity assist Sit to Stand Details (indicate cue type and reason): cues for use of UEs and LE position Stand to Sit: 1: +2 Total assist;With upper extremity assist;To bed Stand to Sit Details: cues for use of UEs and LE position Ambulation/Gait Ambulation/Gait Assistance: 1: +2 Total assist Ambulation/Gait Assistance Details (indicate cue type and reason): pt 70%, cues for posture, LE position, position from RW, increased WB on L LE to encourage heel contact Ambulation Distance (Feet): 22 Feet Assistive device: Rolling walker Gait Pattern: Step-to pattern    Exercise    End of Session PT - End of Session Equipment Utilized During Treatment: Gait belt Activity Tolerance: Patient limited by fatigue Patient left: in bed;with call bell in reach;with family/visitor present General Behavior During Session: [facility] for tasks performed Cognition: [facility] for tasks performed  Deniah Saia 12/21/2010, 4:13 PM

## 2010-12-22 DIAGNOSIS — Z96659 Presence of unspecified artificial knee joint: Secondary | ICD-10-CM

## 2010-12-22 LAB — CBC
MCV: 101.7 fL — ABNORMAL HIGH (ref 78.0–100.0)
Platelets: 153 10*3/uL (ref 150–400)
RDW: 12 % (ref 11.5–15.5)
WBC: 10.1 10*3/uL (ref 4.0–10.5)

## 2010-12-22 LAB — BASIC METABOLIC PANEL
Chloride: 104 mEq/L (ref 96–112)
Creatinine, Ser: 0.64 mg/dL (ref 0.50–1.10)
GFR calc Af Amer: >90 mL/min (ref >90)

## 2010-12-22 MED ORDER — SODIUM CHLORIDE 0.9 % IV BOLUS (SEPSIS)
500.0000 mL | Freq: Once | INTRAVENOUS | Status: AC
  Administered 2010-12-22: 500 mL via INTRAVENOUS

## 2010-12-22 NOTE — Progress Notes (Signed)
Physical Therapy Treatment Patient Details Name: [name] MRN: [id number] DOB: 1933/01/15 Today's Date: 12/22/2010 1025 - 1110  GT, 2TE PT Assessment/Plan  PT - Assessment/Plan PT Plan: Discharge plan remains appropriate PT Frequency: 7X/week Follow Up Recommendations: Home health PT Equipment Recommended: None recommended by PT PT Goals  Acute Rehab PT Goals PT Goal: Sit to Supine/Side - Progress: Progressing toward goal PT Goal: Sit to Stand - Progress: Progressing toward goal PT Goal: Ambulate - Progress: Progressing toward goal  PT Treatment Precautions/Restrictions  Precautions Precautions: Knee Required Braces or Orthoses: Yes Knee Immobilizer: Discontinue once straight leg raise with < 10 degree lag Restrictions Weight Bearing Restrictions: No RLE Weight Bearing: Weight bearing as tolerated Mobility (including Balance) Bed Mobility Sit to Supine - Right: 4: Min assist;3: Mod assist Transfers Sit to Stand: 4: Min assist;With armrests;From chair/3-in-1;With upper extremity assist Sit to Stand Details (indicate cue type and reason): cues for LE position Stand to Sit: 4: Min assist Stand to Sit Details: cues for use of UEs Ambulation/Gait Ambulation/Gait Assistance: 1: +2 Total assist Ambulation/Gait Assistance Details (indicate cue type and reason): pt 70% - cues for sequence, posture, position from RW, increased WB on R LE to encourage increased heel contact Ambulation Distance (Feet): 38 Feet Assistive device: Rolling walker Gait Pattern: Step-to pattern    Exercise  Total Joint Exercises Ankle Circles/Pumps: AROM;AAROM;20 reps;Supine;Both (gastroc stretch on R) Quad Sets: 20 reps;AROM;Both;Supine Heel Slides: 20 reps;AAROM;Right;Supine Straight Leg Raises: AAROM;Right;20 reps;Supine End of Session PT - End of Session Equipment Utilized During Treatment: Gait belt Activity Tolerance: Patient limited by fatigue Patient left: in bed;with call bell in  reach General Behavior During Session: [facility] for tasks performed Cognition: [facility] for tasks performed  Chianna Spirito 12/22/2010, 2:23 PM

## 2010-12-22 NOTE — Progress Notes (Signed)
Occupational Therapy Evaluation Patient Details Name: [name] MRN: [id number] DOB: 1932-04-03 Today's Date: 12/22/[phone] EV2 Problem List:  Patient Active Problem List  Diagnoses  . Preop cardiovascular exam  . Hypertension  . Hyperlipidemia    Past Medical History:  Past Medical History  Diagnosis Date  . Hypertension   . Hypothyroid   . Goiter   . GERD (gastroesophageal reflux disease)   . Fibromyalgia   . PONV (postoperative nausea and vomiting)   . Heart murmur   . Peripheral vascular disease     varicose veins in left arm   . Shortness of breath     with exertion   . Anemia     hx of in 2003   . Hiatal hernia   . Headache     tension headache daily per pt   . Arthritis   . Colon cancer     s/p resecton of colon in 2003   . Depression    Past Surgical History:  Past Surgical History  Procedure Date  . Tonsillectomy   . Tmj arthroplasty   . Back surgery   . Appendectomy   . Colon resection   . Eye surgery     bilateral cataract surgery   . Total knee arthroplasty 12/20/2010    Procedure: TOTAL KNEE ARTHROPLASTY;  Surgeon: Shelda Pal;  Location: [facility];  Service: Orthopedics;  Laterality: Right;    OT Assessment/Plan/Recommendation OT Assessment Clinical Impression Statement: Pt w/new RTKR. Would benefit from skilled OT to maximize ADL I to supervision for safe d/c home w/prn A from family, HHOT OT Recommendation/Assessment: Patient will need skilled OT in the acute care venue OT Problem List: Decreased activity tolerance;Decreased knowledge of use of DME or AE;Decreased safety awareness Barriers to Discharge: None OT Recommendation Follow Up Recommendations: Home health OT Equipment Recommended: None recommended by OT Individuals Consulted Consulted and Agree with Results and Recommendations: Patient OT Goals Acute Rehab OT Goals OT Goal Formulation: With patient Time For Goal Achievement: 2 weeks ADL Goals Pt Will Perform Lower  Body Bathing: with set-up;Sit to stand from chair;Sit to stand from bed;with adaptive equipment ADL Goal: Lower Body Bathing - Progress: Progressing toward goals Pt Will Perform Lower Body Dressing: with set-up;Sit to stand from chair;Sit to stand from bed;with adaptive equipment ADL Goal: Lower Body Dressing - Progress: Progressing toward goals Pt Will Transfer to Toilet: with supervision;Raised toilet seat with arms;Ambulation ADL Goal: Toilet Transfer - Progress: Progressing toward goals Pt Will Perform Toileting - Clothing Manipulation: with supervision;Standing ADL Goal: Toileting - Clothing Manipulation - Progress: Progressing toward goals Pt Will Perform Toileting - Hygiene: with supervision;Standing at 3-in-1/toilet ADL Goal: Toileting - Hygiene - Progress: Progressing toward goals  OT Evaluation Precautions/Restrictions  Precautions Precautions: Knee Required Braces or Orthoses: Yes Knee Immobilizer: Discontinue once straight leg raise with < 10 degree lag Restrictions Weight Bearing Restrictions: No RLE Weight Bearing: Weight bearing as tolerated Prior Functioning Home Living Bathroom Shower/Tub: Tub/shower unit;Other (comment) (Pt states she only spongebathes.) Bathroom Toilet: Handicapped height Bathroom Accessibility: Yes How Accessible: Accessible via walker Home Adaptive Equipment: Raised toilet seat with rails;Reacher;Walker - rolling Prior Function Level of Independence: Independent with homemaking with ambulation Driving: No ADL ADL Eating/Feeding: Performed;Independent Where Assessed - Eating/Feeding: Bed level Grooming: Performed;Wash/dry hands;Wash/dry face;Set up Where Assessed - Grooming: Sitting, bed;Unsupported Upper Body Bathing: Performed;Chest;Right arm;Left arm;Abdomen;Set up Where Assessed - Upper Body Bathing: Sitting, bed;Unsupported Lower Body Bathing: Performed;Minimal assistance;Other (comment) (min A needed for R calf &  R foot) Where Assessed  - Lower Body Bathing: Sit to stand from bed Upper Body Dressing: Performed;Set up Where Assessed - Upper Body Dressing: Sitting, bed;Unsupported Lower Body Dressing: Moderate assistance;Other (comment) (to thread LEs into underwear & don R sock) Where Assessed - Lower Body Dressing: Sit to stand from bed Toilet Transfer: Simulated;Other (comment) (minguard A w/RW) Toilet Transfer Method: Stand Wellsite geologist: Other (comment) (RW, VCs for posture, technique & hand placement) Toileting - Hygiene: Simulated (minguad A w/RW) Where Assessed - Toileting Hygiene: Standing Tub/Shower Transfer: Not assessed Tub/Shower Transfer Method: Not assessed ADL Comments: Pt fatigues quickly. Required several RBs Vision/Perception    Cognition Cognition Arousal/Alertness: Awake/alert Overall Cognitive Status: Appears within functional limits for tasks assessed Orientation Level: Oriented X4 Sensation/Coordination   Extremity Assessment RUE Assessment RUE Assessment: Within Functional Limits LUE Assessment LUE Assessment: Within Functional Limits Mobility  Bed Mobility Bed Mobility: Yes Supine to Sit: 5: Supervision;HOB elevated (Comment degrees);With rails Transfers Transfers: Yes Sit to Stand: Other (comment);From elevated surface;With upper extremity assist (mingurard A w/RW) Sit to Stand Details (indicate cue type and reason): VCs for posture, hand placement Stand to Sit: Other (comment);With armrests;With upper extremity assist;To chair/3-in-1 Stand to Sit Details: Uncontrolled descent, cues for hand placement & RLE position Exercises   End of Session OT - End of Session Equipment Utilized During Treatment: Other (comment);Right knee immobilizer (RW) Activity Tolerance: Patient tolerated treatment well Patient left: in chair;with call bell in reach General Behavior During Session: [facility] for tasks performed Cognition: [facility] for tasks performed   Naim Murtha [phone]   12/22/2010, 9:45 AM

## 2010-12-22 NOTE — Progress Notes (Signed)
Physical Therapy Treatment Patient Details Name: [name] MRN: [id number] DOB: 1932-11-20 Today's Date: 12/22/2010 1430 - 1453  2GT PT Assessment/Plan  PT - Assessment/Plan PT Plan: Discharge plan remains appropriate PT Frequency: 7X/week Follow Up Recommendations: Home health PT Equipment Recommended: None recommended by PT PT Goals  Acute Rehab PT Goals PT Goal: Supine/Side to Sit - Progress: Progressing toward goal PT Goal: Sit to Supine/Side - Progress: Progressing toward goal PT Goal: Sit to Stand - Progress: Progressing toward goal PT Goal: Ambulate - Progress: Progressing toward goal PT Goal: Up/Down Stairs - Progress: Not met  PT Treatment Precautions/Restrictions  Precautions Precautions: Knee Required Braces or Orthoses: Yes Knee Immobilizer: Discontinue once straight leg raise with < 10 degree lag Restrictions Weight Bearing Restrictions: No RLE Weight Bearing: Weight bearing as tolerated Mobility (including Balance) Bed Mobility Sit to Supine - Right: 5: Supervision Transfers Sit to Stand: 4: Min assist;5: Supervision;From bed;With upper extremity assist Sit to Stand Details (indicate cue type and reason): cues for use of UEs Stand to Sit: 5: Supervision;4: Min assist;To chair/3-in-1;With upper extremity assist;With armrests Stand to Sit Details: cues for LE position Ambulation/Gait Ambulation/Gait: Yes Ambulation/Gait Assistance: 4: Min assist;3: Mod assist Ambulation/Gait Assistance Details (indicate cue type and reason): cues for position from RW and posture.  Pt with shoes on this session - greatly assisted ability to increase heel contact on R Ambulation Distance (Feet): 100 Feet Assistive device: Rolling walker Gait Pattern: Step-to pattern    Exercise  End of Session PT - End of Session Equipment Utilized During Treatment: Gait belt Activity Tolerance: Patient tolerated treatment well Patient left: in chair;with call bell in reach Nurse  Communication: Other (comment) (Pt requires shoes when up on feet) General Behavior During Session: [facility] for tasks performed Cognition: [facility] for tasks performed  [name] 12/22/2010, 3:46 PM

## 2010-12-22 NOTE — Progress Notes (Signed)
Subjective: 2 Days Post-Op Procedure(s) (LRB): TOTAL KNEE ARTHROPLASTY (Right)   Patient reports pain as mild. No events. Complains of some tightness in the knee, but pain is ok.  Objective:   VITALS:   Filed Vitals:   12/22/10 0619  BP: 125/72  Pulse: 98  Temp: 98.3 F (36.8 C)  Resp: 18    Neurovascular intact Dorsiflexion/Plantar flexion intact Incision: dressing C/D/I No cellulitis present Compartment soft  LABS  Basename 12/22/10 0410 12/21/10 0420  HGB 8.1* 11.0*  HCT 24.0* 32.7*  WBC 10.1 14.5*  PLT 153 222     Basename 12/22/10 0410 12/21/10 0420  NA 135 133*  K 4.2 4.2  BUN 10 12  CREATININE 0.64 0.64  GLUCOSE 147* 176*   Assessment/Plan: 2 Days Post-Op Procedure(s) (LRB): TOTAL KNEE ARTHROPLASTY (Right)   Advance diet Up with therapy Plan for discharge tomorrow if does well with PT   Anastasio Auerbach. Adarryl Goldammer   PAC  12/22/2010, 9:07 AM

## 2010-12-23 MED ORDER — FERROUS SULFATE 325 (65 FE) MG PO TABS: 325.0000 mg | ORAL_TABLET | Freq: Three times a day (TID) | ORAL | Status: DC

## 2010-12-23 MED ORDER — ASPIRIN 325 MG PO TABS: 325.0000 mg | ORAL_TABLET | Freq: Two times a day (BID) | ORAL | Status: DC

## 2010-12-23 MED ORDER — METHOCARBAMOL 500 MG PO TABS: 500.0000 mg | ORAL_TABLET | Freq: Four times a day (QID) | ORAL | Status: AC | PRN

## 2010-12-23 MED ORDER — TRAMADOL HCL 50 MG PO TABS: 50.0000 mg | ORAL_TABLET | Freq: Four times a day (QID) | ORAL | Status: AC

## 2010-12-23 MED ORDER — DSS 100 MG PO CAPS: 100.0000 mg | ORAL_CAPSULE | Freq: Two times a day (BID) | ORAL | Status: AC

## 2010-12-23 MED ORDER — POLYETHYLENE GLYCOL 3350 17 G PO PACK: 17.0000 g | PACK | Freq: Two times a day (BID) | ORAL | Status: AC

## 2010-12-23 NOTE — Progress Notes (Signed)
Subjective: 3 Days Post-Op Procedure(s) (LRB): TOTAL KNEE ARTHROPLASTY (Right)   Patient reports pain as mild. No events.  Objective:   VITALS:   Filed Vitals:   12/23/10 0455  BP: 122/67  Pulse: 109  Temp: 100 F (37.8 C)  Resp: 16    Neurovascular intact Dorsiflexion/Plantar flexion intact Incision: dressing C/D/I No cellulitis present Compartment soft  LABS  Basename 12/22/10 0410 12/21/10 0420  HGB 8.1* 11.0*  HCT 24.0* 32.7*  WBC 10.1 14.5*  PLT 153 222     Basename 12/22/10 0410 12/21/10 0420  NA 135 133*  K 4.2 4.2  BUN 10 12  CREATININE 0.64 0.64  GLUCOSE 147* 176*    Assessment/Plan: 3 Days Post-Op Procedure(s) (LRB): TOTAL KNEE ARTHROPLASTY (Right)   Up with therapy Discharge home with home health today, after PT   [name]. [name]   PAC  12/23/2010, 7:59 AM

## 2010-12-23 NOTE — Progress Notes (Signed)
Physical Therapy Treatment Patient Details Name: [name] MRN: [id number] DOB: 10/17/1932 Today's Date: 12/23/2010 1255 - 1318  GT, TAR PT Assessment/Plan  PT - Assessment/Plan PT Plan: Discharge plan remains appropriate PT Frequency: 7X/week Follow Up Recommendations: Home health PT Equipment Recommended: None recommended by PT PT Goals  Acute Rehab PT Goals PT Goal: Supine/Side to Sit - Progress: Met PT Goal: Sit to Stand - Progress: Met PT Goal: Ambulate - Progress: Partly met PT Goal: Up/Down Stairs - Progress: Met  PT Treatment Precautions/Restrictions  Precautions Precautions: Knee Required Braces or Orthoses: Yes Knee Immobilizer: Discontinue once straight leg raise with < 10 degree lag Restrictions Weight Bearing Restrictions: No RLE Weight Bearing: Weight bearing as tolerated Mobility (including Balance) Bed Mobility Supine to Sit: 5: Supervision Transfers Sit to Stand: 5: Supervision Sit to Stand Details (indicate cue type and reason): cues for use of UEs Stand to Sit: 5: Supervision Stand to Sit Details: cues for LE position Ambulation/Gait Ambulation/Gait Assistance: 5: Supervision Ambulation/Gait Assistance Details (indicate cue type and reason): cues for position from RW Ambulation Distance (Feet): 60 Feet Assistive device: Rolling walker Gait Pattern: Step-to pattern Stairs: Yes Stairs Assistance: 4: Min assist Stairs Assistance Details (indicate cue type and reason): cues for sequence and foot/crutch placement Stair Management Technique: One rail Right;Step to pattern;With crutches;Forwards Number of Stairs: 4    Reviewed car transfers with pt and son  End of Session PT - End of Session Activity Tolerance: Patient tolerated treatment well Patient left: with family/visitor present;in chair;with call bell in reach Nurse Communication:  (PT completed - pt ready for d/c) General Behavior During Session: [facility] for tasks performed Cognition: [facility]  for tasks performed  Rayne Loiseau 12/23/2010, 1:47 PM

## 2010-12-23 NOTE — Progress Notes (Signed)
12/23/2010 Raynelle Bring BSN CCM [phone] Per Kennon Rounds at [facility], agency will be able to provide HHpt for patient with start date of tomorrow. Pt already has DME. pt for d/c today with son as caregiver   12/22/2010 Raynelle Bring bsn ccm tct [facility]-sarah in intake, they do not have therapist that services [place]. CM will continue to follow [facility]-spoke with sally in intake-she will let me know on Thurs it they have PT available to service patient-/face sheet faxed [phone]/cm will follow

## 2010-12-23 NOTE — Progress Notes (Signed)
Pt for discharge to home with [facility] today. IV d/c'd since pt will be d/c'd. Dressing CDI to R knee. Dressing supplies provided for home care. 2 PT sessions required by MD prior to d/c. Son needs to be present to be trained by PT with mobility & stairs. No changes in am assessments today. Discharge instructions & Rx to be given to son/pt when ready to leave after 2nd PT session. Son to bring pt home.

## 2010-12-23 NOTE — Progress Notes (Signed)
Physical Therapy Treatment Patient Details Name: [name] MRN: [id number] DOB: 06-Oct-1932 Today's Date: 12/23/2010 9147 - 0912  2TE, GT PT Assessment/Plan  PT - Assessment/Plan PT Plan: Discharge plan remains appropriate PT Frequency: 7X/week Follow Up Recommendations: Home health PT Equipment Recommended: None recommended by PT PT Goals  Acute Rehab PT Goals PT Goal: Supine/Side to Sit - Progress: Met PT Goal: Sit to Stand - Progress: Progressing toward goal PT Goal: Ambulate - Progress: Progressing toward goal  PT Treatment Precautions/Restrictions  Precautions Precautions: Knee Required Braces or Orthoses: Yes Knee Immobilizer: Discontinue once straight leg raise with < 10 degree lag Restrictions Weight Bearing Restrictions: No RLE Weight Bearing: Weight bearing as tolerated Mobility (including Balance) Bed Mobility Supine to Sit: 5: Supervision Transfers Sit to Stand: 4: Min assist;5: Supervision;From bed;With upper extremity assist Sit to Stand Details (indicate cue type and reason): cues for UE use Stand to Sit: 4: Min assist;5: Supervision Stand to Sit Details: cues for LE position and use of UEs Ambulation/Gait Ambulation/Gait Assistance: 4: Min assist;5: Supervision Ambulation/Gait Assistance Details (indicate cue type and reason): cues for sequence and position from RW and increased WB R LE Ambulation Distance (Feet): 200 Feet Assistive device: Rolling walker Gait Pattern: Step-to pattern    Exercise  Total Joint Exercises Ankle Circles/Pumps: AROM;AAROM;20 reps;Supine;Both Quad Sets: 20 reps;AROM;Both;Supine Heel Slides: 20 reps;AAROM;Right;Supine Straight Leg Raises: AROM;AAROM;20 reps;Supine;Right End of Session PT - End of Session Activity Tolerance: Patient tolerated treatment well Patient left: in chair;with call bell in reach General Behavior During Session: [facility] for tasks performed Cognition: [facility] for tasks  performed  [name] 12/23/2010, 1:40 PM

## 2010-12-23 NOTE — Anesthesia Postprocedure Evaluation (Signed)
  Anesthesia Post-op Note  Patient: [name]  Procedure(s) Performed:  TOTAL KNEE ARTHROPLASTY  Patient Location: PACU  Anesthesia Type: Spinal  Level of Consciousness: awake and alert   Airway and Oxygen Therapy: Patient Spontanous Breathing  Post-op Pain: mild  Post-op Assessment: Post-op Vital signs reviewed, Patient's Cardiovascular Status Stable, Respiratory Function Stable, Patent Airway and No signs of Nausea or vomiting  Post-op Vital Signs: stable  Complications: No apparent anesthesia complications

## 2010-12-23 NOTE — Discharge Summary (Signed)
Physician Discharge Summary  Patient ID: [name] MRN: [id number] DOB/AGE: 75/02/1932 75 y.o.  Admit date: 12/20/2010 Discharge date: 12/23/2010  Procedures:  Procedure(s) (LRB): TOTAL KNEE ARTHROPLASTY (Right)  Attending Physician: Shelda Pal   Admission Diagnoses: Osteoarthritis, right knee.    Discharge Diagnoses:  Principal Problem:  *S/P right knee replacement Hypertension.  Hyperlipidemia.  Hypertriglyceridemia.  Insomnia.  Osteopenia.  Hypothyroidism.  Depression.  History of cervical degenerative disk disease  Under investigation for possibility of atrial fibrillation.  HPI: This is a 77 year old lady with a history of osteoarthritis of her right knee that has failed conservative management. X-rays in the clinic show significant end-stage degenerative joint disease.  After discussion of treatments, benefits, risks, and options, the patient is scheduled for total knee arthroplasty of the right knee.    PCP: [name], MD   Discharged Condition: good  Hospital Course:  Patient underwent the above stated procedure on 12/20/2010. Patient tolerated the procedure well and brought to the recovery room in good condition and subsequently to the floor.  POD #1 BP: 119/72 ; Pulse: 102 ; Temp: 97.7 F Patient reports pain as mild. However complaining of nausea, believe it is from the pain medicine. Pt's foley was removed, as well as the hemovac drain removed. IV was changed to a saline lock. Neurovascular intact, dorsiflexion/plantar flexion intact, incision: dressing C/D/I, no cellulitis present and compartment soft.  LABS  Basename  12/21/10 0420   HGB  11.0*   HCT  32.7*    POD #2  BP: 125/72 ; Pulse: 98 ; Temp: 98.3 F Patient reports pain as mild. No events. Complains of some tightness in the knee, but pain is ok. Neurovascular intact, dorsiflexion/plantar flexion intact, incision: dressing C/D/I, no cellulitis present and compartment soft.  LABS    Basename  12/22/10 0410    HGB  8.1*    HCT  24.0*     POD #3  BP: 122/67 ; Pulse: 109 ; Temp: 100 F  Patient reports pain as mild. No events. Felt to be doing well enough to be discharged home with HHPT.  Neurovascular intact, dorsiflexion/plantar flexion intact, incision: dressing C/D/I, no cellulitis present and compartment soft.  LABS  No new labs  Discharge Exam: Extremities: Homans sign is negative, no sign of DVT, no edema, redness or tenderness in the calves or thighs and no ulcers, gangrene or trophic changes  Disposition: Home or Self Care  Discharge Orders    Future Orders Please Complete By Expires   Diet - low sodium heart healthy      Call MD / Call 911      Comments:   If you experience chest pain or shortness of breath, CALL 911 and be transported to the hospital emergency room.  If you develope a fever above 101 F, pus (white drainage) or increased drainage or redness at the wound, or calf pain, call your surgeon's office.   Discharge instructions      Comments:   Maintain surgical dressing for 8 days, then replace with gauze and tape. Keep the area dry and clean until follow up. Follow up in 2 weeks at [facility]. Call with any questions or concerns.     Constipation Prevention      Comments:   Drink plenty of fluids.  Prune juice may be helpful.  You may use a stool softener, such as Colace (over the counter) 100 mg twice a day.  Use MiraLax (over the counter) for constipation as needed.  Increase activity slowly as tolerated      Weight Bearing as taught in Physical Therapy      Comments:   Use a walker or crutches as instructed.   Driving restrictions      Comments:   No driving for 4 weeks   TED hose      Comments:   Use stockings (TED hose) for 2 weeks on both leg(s).  You may remove them at night for sleeping.   Change dressing      Comments:   Maintain surgical dressing for 8 days, then change the dressing daily with sterile 4 x 4  inch gauze dressing and tape. Keep area dry and clean.     Current Discharge Medication List    START taking these medications   Details  docusate sodium 100 MG CAPS Take 100 mg by mouth 2 (two) times daily.    ferrous sulfate 325 (65 FE) MG tablet Take 1 tablet (325 mg total) by mouth 3 (three) times daily after meals.    methocarbamol (ROBAXIN) 500 MG tablet Take 1 tablet (500 mg total) by mouth every 6 (six) hours as needed.    traMADol (ULTRAM) 50 MG tablet Take 1-2 tablets (50-100 mg total) by mouth every 6 (six) hours. Maximum dose= 8 tablets per day Qty: 100 tablet, Refills: 0      CONTINUE these medications which have CHANGED   Details  aspirin 325 MG tablet Take 1 tablet (325 mg total) by mouth 2 (two) times daily.    polyethylene glycol (MIRALAX / GLYCOLAX) packet Take 17 g by mouth 2 (two) times daily. Qty: 14 each      CONTINUE these medications which have NOT CHANGED   Details  amLODipine (NORVASC) 5 MG tablet Take 5 mg by mouth every morning.     capsaicin (ZOSTRIX) 0.025 % cream Apply 1 application topically daily. Pain     levothyroxine (SYNTHROID, LEVOTHROID) 112 MCG tablet Take 112 mcg by mouth every morning.     medroxyPROGESTERone (PROVERA) 5 MG tablet Take 2.5 mg by mouth daily.     omeprazole (PRILOSEC) 20 MG capsule Take 20 mg by mouth daily.     Polyethyl Glycol-Propyl Glycol (SYSTANE ULTRA) 0.4-0.3 % SOLN Apply 1 drop to eye 2 (two) times daily as needed. Dry eye    risedronate (ACTONEL) 150 MG tablet Take 150 mg by mouth every 30 (thirty) days. with water on empty stomach, nothing by mouth or lie down for next 30 minutes.    simvastatin (ZOCOR) 20 MG tablet Take 20 mg by mouth at bedtime.     zolpidem (AMBIEN) 10 MG tablet Take 10 mg by mouth at bedtime as needed. Sleep     acetaminophen (TYLENOL) 500 MG tablet Take 500 mg by mouth daily. Pt takes at 7pm    Calcium-Vitamin D (CALTRATE 600 PLUS-VIT D PO) Take 1 tablet by mouth once a week.      Multiple Vitamins-Minerals (MULTI COMPLETE PO) Take by mouth daily.     niacin (NIASPAN) 1000 MG CR tablet Take 1,000 mg by mouth at bedtime.     nortriptyline (PAMELOR) 25 MG capsule Take 25-50 mg by mouth 2 (two) times daily. 25 mg in the morning and 50 mg in the evening    vitamin B-12 (CYANOCOBALAMIN) 500 MCG tablet Take 500 mcg by mouth daily.     vitamin C (ASCORBIC ACID) 500 MG tablet Take 500 mg by mouth daily.     Vitamin D, Ergocalciferol, (DRISDOL) 50000  UNITS CAPS Take 50,000 Units by mouth every 7 (seven) days. saturday    vitamin E 400 UNIT capsule Take 400 Units by mouth 2 (two) times daily.       STOP taking these medications     estradiol (ESTRACE) 0.5 MG tablet      OVER THE COUNTER MEDICATION          Signed: Anastasio Auerbach. Nalina Yeatman   PAC  12/23/2010, 8:09 AM

## 2010-12-24 ENCOUNTER — Other Ambulatory Visit: Payer: Self-pay | Admitting: Orthopedic Surgery

## 2010-12-24 ENCOUNTER — Encounter (HOSPITAL_COMMUNITY): Payer: Self-pay | Admitting: Emergency Medicine

## 2010-12-24 ENCOUNTER — Emergency Department (HOSPITAL_COMMUNITY)
Admission: EM | Admit: 2010-12-24 | Discharge: 2010-12-24 | Disposition: A | Discharge status: Rehabilitation Facility | Payer: Medicare Other | Attending: Emergency Medicine | Admitting: Emergency Medicine

## 2010-12-24 ENCOUNTER — Emergency Department (HOSPITAL_COMMUNITY): Payer: Medicare Other

## 2010-12-24 DIAGNOSIS — I10 Essential (primary) hypertension: Secondary | ICD-10-CM | POA: Insufficient documentation

## 2010-12-24 DIAGNOSIS — W19XXXA Unspecified fall, initial encounter: Secondary | ICD-10-CM | POA: Insufficient documentation

## 2010-12-24 DIAGNOSIS — I70209 Unspecified atherosclerosis of native arteries of extremities, unspecified extremity: Secondary | ICD-10-CM | POA: Insufficient documentation

## 2010-12-24 DIAGNOSIS — R079 Chest pain, unspecified: Secondary | ICD-10-CM | POA: Insufficient documentation

## 2010-12-24 DIAGNOSIS — E039 Hypothyroidism, unspecified: Secondary | ICD-10-CM | POA: Insufficient documentation

## 2010-12-24 DIAGNOSIS — J9819 Other pulmonary collapse: Secondary | ICD-10-CM | POA: Insufficient documentation

## 2010-12-24 DIAGNOSIS — Z79899 Other long term (current) drug therapy: Secondary | ICD-10-CM | POA: Insufficient documentation

## 2010-12-24 DIAGNOSIS — Z96659 Presence of unspecified artificial knee joint: Secondary | ICD-10-CM | POA: Insufficient documentation

## 2010-12-24 DIAGNOSIS — Y92009 Unspecified place in unspecified non-institutional (private) residence as the place of occurrence of the external cause: Secondary | ICD-10-CM | POA: Insufficient documentation

## 2010-12-24 DIAGNOSIS — T1490XA Injury, unspecified, initial encounter: Secondary | ICD-10-CM | POA: Insufficient documentation

## 2010-12-24 HISTORY — DX: Unspecified osteoarthritis, unspecified site: M19.90

## 2010-12-24 LAB — URINALYSIS, ROUTINE W REFLEX MICROSCOPIC
Bilirubin Urine: NEGATIVE
Hgb urine dipstick: NEGATIVE
Nitrite: NEGATIVE
Specific Gravity, Urine: 1.014 (ref 1.005–1.030)
Urobilinogen, UA: 0.2 mg/dL (ref 0.0–1.0)
pH: 6.5 (ref 5.0–8.0)

## 2010-12-24 LAB — POCT I-STAT, CHEM 8
Chloride: 97 mEq/L (ref 96–112)
Creatinine, Ser: 0.7 mg/dL (ref 0.50–1.10)
HCT: 25 % — ABNORMAL LOW (ref 36.0–46.0)
Hemoglobin: 8.5 g/dL — ABNORMAL LOW (ref 12.0–15.0)
Potassium: 3.9 mEq/L (ref 3.5–5.1)
Sodium: 134 mEq/L — ABNORMAL LOW (ref 135–145)

## 2010-12-24 LAB — CBC
HCT: 25.4 % — ABNORMAL LOW (ref 36.0–46.0)
Platelets: 178 10*3/uL (ref 150–400)
RDW: 11.9 % (ref 11.5–15.5)
WBC: 10.8 10*3/uL — ABNORMAL HIGH (ref 4.0–10.5)

## 2010-12-24 MED ORDER — VITAMIN B-12 500 MCG PO TABS
500.0000 ug | ORAL_TABLET | Freq: Every day | ORAL | Status: DC
  Administered 2010-12-24: 500 ug via ORAL
  Filled 2010-12-24: qty 1

## 2010-12-24 MED ORDER — POLYETHYLENE GLYCOL 3350 17 G PO PACK
17.0000 g | PACK | Freq: Two times a day (BID) | ORAL | Status: DC
  Administered 2010-12-24: 17 g via ORAL
  Filled 2010-12-24 (×2): qty 1

## 2010-12-24 MED ORDER — DOCUSATE SODIUM 100 MG PO CAPS
100.0000 mg | ORAL_CAPSULE | Freq: Two times a day (BID) | ORAL | Status: DC
  Administered 2010-12-24: 100 mg via ORAL
  Filled 2010-12-24 (×2): qty 1

## 2010-12-24 MED ORDER — PANTOPRAZOLE SODIUM 40 MG PO TBEC
40.0000 mg | DELAYED_RELEASE_TABLET | Freq: Every day | ORAL | Status: DC
  Administered 2010-12-24: 40 mg via ORAL
  Filled 2010-12-24: qty 1

## 2010-12-24 MED ORDER — AMLODIPINE BESYLATE 5 MG PO TABS
5.0000 mg | ORAL_TABLET | Freq: Every day | ORAL | Status: DC
  Administered 2010-12-24: 5 mg via ORAL
  Filled 2010-12-24: qty 1

## 2010-12-24 MED ORDER — ONDANSETRON HCL 4 MG/2ML IJ SOLN
4.0000 mg | Freq: Four times a day (QID) | INTRAMUSCULAR | Status: DC | PRN
  Administered 2010-12-24: 4 mg via INTRAVENOUS
  Filled 2010-12-24: qty 2

## 2010-12-24 MED ORDER — NORTRIPTYLINE HCL 25 MG PO CAPS
25.0000 mg | ORAL_CAPSULE | Freq: Every day | ORAL | Status: DC
  Administered 2010-12-24: 25 mg via ORAL
  Filled 2010-12-24: qty 1

## 2010-12-24 MED ORDER — VITAMIN C 500 MG PO TABS
500.0000 mg | ORAL_TABLET | Freq: Every day | ORAL | Status: DC
  Administered 2010-12-24: 500 mg via ORAL
  Filled 2010-12-24: qty 1

## 2010-12-24 MED ORDER — HYDROCODONE-ACETAMINOPHEN 5-325 MG PO TABS: 1.0000 | ORAL_TABLET | ORAL | Status: DC | PRN

## 2010-12-24 MED ORDER — SODIUM CHLORIDE 0.9 % IV SOLN
INTRAVENOUS | Status: DC
  Administered 2010-12-24 (×2): via INTRAVENOUS
  Filled 2010-12-24 (×2): qty 1000

## 2010-12-24 MED ORDER — ASPIRIN 325 MG PO TABS
325.0000 mg | ORAL_TABLET | Freq: Two times a day (BID) | ORAL | Status: DC
  Administered 2010-12-24: 325 mg via ORAL
  Filled 2010-12-24: qty 1

## 2010-12-24 MED ORDER — NORTRIPTYLINE HCL 25 MG PO CAPS
50.0000 mg | ORAL_CAPSULE | Freq: Every day | ORAL | Status: DC
  Filled 2010-12-24: qty 2

## 2010-12-24 MED ORDER — VITAMIN E 180 MG (400 UNIT) PO CAPS
400.0000 [IU] | ORAL_CAPSULE | Freq: Two times a day (BID) | ORAL | Status: DC
  Administered 2010-12-24: 400 [IU] via ORAL
  Filled 2010-12-24 (×2): qty 1

## 2010-12-24 MED ORDER — FERROUS SULFATE 325 (65 FE) MG PO TABS
325.0000 mg | ORAL_TABLET | Freq: Three times a day (TID) | ORAL | Status: DC
  Administered 2010-12-24: 325 mg via ORAL
  Filled 2010-12-24 (×3): qty 1

## 2010-12-24 MED ORDER — LEVOTHYROXINE SODIUM 112 MCG PO TABS: 112.0000 ug | ORAL_TABLET | Freq: Every day | ORAL | Status: DC

## 2010-12-24 MED ORDER — ONDANSETRON HCL 4 MG PO TABS: 4.0000 mg | ORAL_TABLET | Freq: Four times a day (QID) | ORAL | Status: DC | PRN

## 2010-12-24 MED ORDER — HYDROMORPHONE HCL PF 1 MG/ML IJ SOLN
0.5000 mg | INTRAMUSCULAR | Status: DC | PRN
  Administered 2010-12-24: 1 mg via INTRAVENOUS
  Filled 2010-12-24: qty 1

## 2010-12-24 MED ORDER — NORTRIPTYLINE HCL 25 MG PO CAPS: 25.0000 mg | ORAL_CAPSULE | Freq: Two times a day (BID) | ORAL | Status: DC

## 2010-12-24 NOTE — ED Provider Notes (Addendum)
History     CSN: [id number] Arrival date & time: 12/24/2010  5:54 AM   First MD Initiated Contact with Patient 12/24/10 [phone]      Chief Complaint  Patient presents with  . Fall    (Consider location/radiation/quality/duration/timing/severity/associated sxs/prior treatment) HPI Patient fell while attempting to get up to go to the bathroom this morning. Complains of right lateral rib pain no other complaint. No shortness of breath no abdominal pain. No other new injury patient released from here yesterday as inpatient for total knee replacement right side pain is mild worse with moving improved with remaining still no treatment by EMS prior to coming here with oxygen Past Medical History  Diagnosis Date  . Hypertension   . Hypothyroid   . Goiter   . GERD (gastroesophageal reflux disease)   . Fibromyalgia   . PONV (postoperative nausea and vomiting)   . Heart murmur   . Peripheral vascular disease     varicose veins in left arm   . Shortness of breath     with exertion   . Anemia     hx of in 2003   . Hiatal hernia   . Headache     tension headache daily per pt   . Arthritis   . Colon cancer     s/p resecton of colon in 2003   . Depression    Atrial fibrillation Past Surgical History  Procedure Date  . Tonsillectomy   . Tmj arthroplasty   . Back surgery   . Appendectomy   . Colon resection   . Eye surgery     bilateral cataract surgery   . Total knee arthroplasty 12/20/2010    Procedure: TOTAL KNEE ARTHROPLASTY;  Surgeon: Shelda Pal;  Location: [facility];  Service: Orthopedics;  Laterality: Right;  . Joint replacement     Family History  Problem Relation Age of Onset  . Heart disease Mother     Enlarged heart  . Arrhythmia Mother   . Diabetes Mother   . Stroke Sister   . Heart disease Brother     MI at unknown age  . Anesthesia problems Brother   . Heart attack      MI at age 15  . Heart disease Brother     CABG    History  Substance Use Topics  .  Smoking status: Never Smoker   . Smokeless tobacco: Never Used  . Alcohol Use: Not on file    OB History    Grav Para Term Preterm Abortions TAB SAB Ect Mult Living                  Review of Systems  Constitutional: Negative.   HENT: Negative.   Respiratory: Negative.   Cardiovascular: Negative.   Gastrointestinal: Negative.   Musculoskeletal: Negative.        Right lower extremity kneeimmmobilizer, rib pain  Skin: Negative.   Neurological: Negative.   Hematological: Negative.   Psychiatric/Behavioral: Negative.   All other systems reviewed and are negative.    Allergies  Latex; Lidocaine; and Sulfur  Home Medications   Current Outpatient Rx  Name Route Sig Dispense Refill  . ACETAMINOPHEN 500 MG PO TABS Oral Take 500 mg by mouth daily. Pt takes at 7pm    . AMLODIPINE BESYLATE 5 MG PO TABS Oral Take 5 mg by mouth every morning.     . ASPIRIN 325 MG PO TABS Oral Take 1 tablet (325 mg total) by mouth 2 (two)  times daily.    Marland Kitchen CALTRATE 600 PLUS-VIT D PO Oral Take 1 tablet by mouth once a week.     Marland Kitchen CAPSAICIN 0.025 % EX CREA Topical Apply 1 application topically daily. Pain     . DSS 100 MG PO CAPS Oral Take 100 mg by mouth 2 (two) times daily.    Marland Kitchen FERROUS SULFATE 325 (65 FE) MG PO TABS Oral Take 1 tablet (325 mg total) by mouth 3 (three) times daily after meals.    Marland Kitchen LEVOTHYROXINE SODIUM 112 MCG PO TABS Oral Take 112 mcg by mouth every morning.     Marland Kitchen MEDROXYPROGESTERONE ACETATE 5 MG PO TABS Oral Take 2.5 mg by mouth daily.     Marland Kitchen METHOCARBAMOL 500 MG PO TABS Oral Take 1 tablet (500 mg total) by mouth every 6 (six) hours as needed.    Malachy Mood COMPLETE PO Oral Take by mouth daily.     Marland Kitchen NIACIN (ANTIHYPERLIPIDEMIC) 1000 MG PO TBCR Oral Take 1,000 mg by mouth at bedtime.     Marland Kitchen NORTRIPTYLINE HCL 25 MG PO CAPS Oral Take 25-50 mg by mouth 2 (two) times daily. 25 mg in the morning and 50 mg in the evening    . OMEPRAZOLE 20 MG PO CPDR Oral Take 20 mg by mouth daily.     Marland Kitchen  POLYETHYL GLYCOL-PROPYL GLYCOL 0.4-0.3 % OP SOLN Ophthalmic Apply 1 drop to eye 2 (two) times daily as needed. Dry eye    . POLYETHYLENE GLYCOL 3350 PO PACK Oral Take 17 g by mouth 2 (two) times daily. 14 each   . RISEDRONATE SODIUM 150 MG PO TABS Oral Take 150 mg by mouth every 30 (thirty) days. with water on empty stomach, nothing by mouth or lie down for next 30 minutes.    Marland Kitchen SIMVASTATIN 20 MG PO TABS Oral Take 20 mg by mouth at bedtime.     . TRAMADOL HCL 50 MG PO TABS Oral Take 1-2 tablets (50-100 mg total) by mouth every 6 (six) hours. Maximum dose= 8 tablets per day 100 tablet 0  . VITAMIN B-12 500 MCG PO TABS Oral Take 500 mcg by mouth daily.     Marland Kitchen VITAMIN C 500 MG PO TABS Oral Take 500 mg by mouth daily.     Marland Kitchen VITAMIN D (ERGOCALCIFEROL) 50000 UNITS PO CAPS Oral Take 50,000 Units by mouth every 7 (seven) days. saturday    . VITAMIN E 400 UNITS PO CAPS Oral Take 400 Units by mouth 2 (two) times daily.     Marland Kitchen ZOLPIDEM TARTRATE 10 MG PO TABS Oral Take 10 mg by mouth at bedtime as needed. Sleep       BP 143/62  Pulse 108  Temp(Src) 97.9 F (36.6 C) (Oral)  Resp 20  Ht 5\' 6"  (1.676 m)  Wt 150 lb (68.04 kg)  BMI 24.21 kg/m2  SpO2 100%  Physical Exam  Nursing note and vitals reviewed. Constitutional:       Chronically ill appearing  HENT:  Head: Normocephalic and atraumatic.       Mucous membranes dry  Eyes: Conjunctivae are normal. Pupils are equal, round, and reactive to light.  Neck: Neck supple. No tracheal deviation present. No thyromegaly present.       Entire spine is nontender  Cardiovascular: Normal rate and regular rhythm.   No murmur heard. Pulmonary/Chest: Effort normal and breath sounds normal.       Minimally tender right lateral ribs no crepitus no ecchymosis  Abdominal: Soft.  Bowel sounds are normal. She exhibits no distension. There is no tenderness.  Musculoskeletal: Normal range of motion. She exhibits no edema and no tenderness.       Pelvis stable nontender  right lower extremity in knee immobilizer immobilizer template removed reveals intact surgical no deformity ofextremity, mild edema of foot DP pulse 2  Neurological: She is alert. Coordination normal.  Skin: Skin is warm and dry. No rash noted.  Psychiatric: She has a normal mood and affect.    ED Course  Procedures (including critical care time) Declined pain medicine Labs Reviewed - No data to display No results found.   No diagnosis found.   Results for orders placed during the hospital encounter of 12/24/10  CBC      Component Value Range   WBC 10.8 (*) 4.0 - 10.5 (K/uL)   RBC 2.47 (*) 3.87 - 5.11 (MIL/uL)   Hemoglobin 8.6 (*) 12.0 - 15.0 (g/dL)   HCT 16.1 (*) 09.6 - 46.0 (%)   MCV 102.8 (*) 78.0 - 100.0 (fL)   MCH 34.8 (*) 26.0 - 34.0 (pg)   MCHC 33.9  30.0 - 36.0 (g/dL)   RDW 04.5  40.9 - 81.1 (%)   Platelets 178  150 - 400 (K/uL)  URINALYSIS, ROUTINE W REFLEX MICROSCOPIC      Component Value Range   Color, Urine YELLOW  YELLOW    APPearance CLEAR  CLEAR    Specific Gravity, Urine 1.014  1.005 - 1.030    pH 6.5  5.0 - 8.0    Glucose, UA NEGATIVE  NEGATIVE (mg/dL)   Hgb urine dipstick NEGATIVE  NEGATIVE    Bilirubin Urine NEGATIVE  NEGATIVE    Ketones, ur NEGATIVE  NEGATIVE (mg/dL)   Protein, ur NEGATIVE  NEGATIVE (mg/dL)   Urobilinogen, UA 0.2  0.0 - 1.0 (mg/dL)   Nitrite NEGATIVE  NEGATIVE    Leukocytes, UA NEGATIVE  NEGATIVE   POCT I-STAT, CHEM 8      Component Value Range   Sodium 134 (*) 135 - 145 (mEq/L)   Potassium 3.9  3.5 - 5.1 (mEq/L)   Chloride 97  96 - 112 (mEq/L)   BUN 8  6 - 23 (mg/dL)   Creatinine, Ser 9.14  0.50 - 1.10 (mg/dL)   Glucose, Bld 782 (*) 70 - 99 (mg/dL)   Calcium, Ion 9.56  1.12 - 1.32 (mmol/L)   TCO2 28  0 - 100 (mmol/L)   Hemoglobin 8.5 (*) 12.0 - 15.0 (g/dL)   HCT 21.3 (*) 08.6 - 46.0 (%)   Dg Chest 2 View  12/15/2010  *RADIOLOGY REPORT*  Clinical Data: Preoperative chest radiograph.  Knee surgery. Atrial fibrillation and  heart murmur.  CHEST - 2 VIEW  Comparison: 06/07/2010.  Findings: Cardiopericardial silhouette appears within normal limits.  No airspace disease or effusion.  Chronic bronchitic changes are present along with nonspecific diffuse interstitial prominence.  Thoracic spondylosis is present.  IMPRESSION: Chronic changes of the chest.  No active cardiopulmonary disease.  Original Report Authenticated By: Andreas Newport, M.D.   Dg Ribs Unilateral W/chest Right  12/24/2010  *RADIOLOGY REPORT*  Clinical Data: Fall, right-sided pain.  RIGHT RIBS AND CHEST - 3+ VIEW  Comparison: 12/15/2010  Findings: Heart is normal size.  Slight bibasilar atelectasis. Stable slight interstitial prominence.  No effusions or confluent consolidation.  No acute bony abnormality.  IMPRESSION: Stable slight interstitial prominence.  Bibasilar atelectasis.  Original Report Authenticated By: Cyndie Chime, M.D.   US Breast Right  11/26/2010  *  RADIOLOGY REPORT*  Clinical Data:  Screening callback for questioned right breast mass  DIGITAL DIAGNOSTIC RIGHT MAMMOGRAM WITH CAD AND RIGHT BREAST ULTRASOUND:  Comparison:  Prior exams  Findings:  Additional views confirm the presence of an oval gently lobulated mass with suggestion of a possible fatty hilum in the right breast 12 o'clock location centrally.  This corresponds to the screening mammographic finding. Mammographic images were processed with CAD.  On physical exam, I palpate no abnormality in the central right breast subareolar area.  Ultrasound is performed, showing a hypoechoic oval mass in the central right breast 12 o'clock location 1 cm from the nipple with suggestion of an echogenic fatty hilum but no detectable internal color flow, possibly due to its small size, measuring 6 mm maximally.  This is felt to correspond to the screening mammographic finding.  IMPRESSION: Probable intramammary lymph node right breast 12 o'clock location. No specific imaging features to suggest malignancy.   Short-term follow-up right diagnostic mammogram is recommended in 6 months. Findings and recommendations discussed with the patient and provided in written form at the time of the exam.  BI-RADS CATEGORY 3:  Probably benign finding(s) - short interval follow-up suggested.  Original Report Authenticated By: Harrel Lemon, M.D.   Nm Myocar Single W/spect W/wall Motion And Ef  12/16/2010  Ordering Physician: Olga Millers  Reading Physician: Jonelle Sidle  Clinical Data: 77 year old woman with hypertension, hypothyroidism, hyperlipidemia, referred for assessment of ischemia as part of a preoperative evaluation prior to elective knee surgery.  NUCLEAR MEDICINE STRESS MYOVIEW STUDY WITH SPECT AND LEFT VENTRICULAR EJECTION FRACTION  Radionuclide Data: One-day rest/stress protocol performed with 10/30 mCi of Tc-2m Myoview.  Stress Data: Lexiscan bolus was given in standard fashion.  Heart rate increased from 82 beats per minute up to 109 beats per minute. Blood pressure remained stable at 158/88.  The patient tolerated the procedure well.  Nonspecific ST-T changes were noted, no diagnostic ST-segment changes to suggest ischemia.  Frequent PACs noted throughout the entire study.  No sustained arrhythmias however.  EKG: Baseline tracing shows sinus rhythm 85 beats per minute with frequent PACs, nonspecific ST-T changes.  Scintigraphic Data: Analysis of the raw perfusion data shows evidence of mild breast attenuation, some gut uptake as well.  Tomographic views were obtained using the short axis, vertical long axis, and horizontal long axis planes.  There is a mild, fixed apical defect that is most consistent with breast attenuation. Mild to moderate inferior defect is evident, more prominent at rest, and also consistent with soft tissue attenuation, likely from the diaphragm.  No large, reversible perfusion defects are noted to suggest ischemia.  Gated imaging reveals an EDV of 51, ESV of 15, T I D ratio of  0.98, and LVEF of 71% without wall motion abnormality.  IMPRESSION: Low risk Lexiscan Myoview as outlined.  No diagnostic ST-segment changes were noted.  Frequent PACs seen throughout the study without any sustained arrhythmia.  Perfusion imaging is most consistent with breast and diaphragmatic attenuation affecting the apex of inferior wall, however no clear evidence of scar or ischemia.  LVEF is normal 71%.  Original Report Authenticated By: [id number]   Mm [employer] R  11/26/2010  *RADIOLOGY REPORT*  Clinical Data:  Screening callback for questioned right breast mass  DIGITAL DIAGNOSTIC RIGHT MAMMOGRAM WITH CAD AND RIGHT BREAST ULTRASOUND:  Comparison:  Prior exams  Findings:  Additional views confirm the presence of an oval gently lobulated mass with suggestion of a possible fatty  hilum in the right breast 12 o'clock location centrally.  This corresponds to the screening mammographic finding. Mammographic images were processed with CAD.  On physical exam, I palpate no abnormality in the central right breast subareolar area.  Ultrasound is performed, showing a hypoechoic oval mass in the central right breast 12 o'clock location 1 cm from the nipple with suggestion of an echogenic fatty hilum but no detectable internal color flow, possibly due to its small size, measuring 6 mm maximally.  This is felt to correspond to the screening mammographic finding.  IMPRESSION: Probable intramammary lymph node right breast 12 o'clock location. No specific imaging features to suggest malignancy.  Short-term follow-up right diagnostic mammogram is recommended in 6 months. Findings and recommendations discussed with the patient and provided in written form at the time of the exam.  BI-RADS CATEGORY 3:  Probably benign finding(s) - short interval follow-up suggested.  Original Report Authenticated By: Harrel Lemon, M.D.    MDM  [id number] with Dr.Olin will evaluate patient for remission and placement rehabilitation  facility  Diagnosis#1. Fall #2 contusion right chest wall #3 anemia       Doug Sou, MD 12/24/10 [phone]  Addendum arrangements made for nursing home placement and d/c from here by social worker in conjunction with Dr.Olin.  Doug Sou, MD 12/24/10 1409

## 2010-12-24 NOTE — ED Notes (Signed)
[id number]> Expected date:<BR> Expected time:<BR> Means of arrival:<BR> Comments:<BR> EMS-knee and hip pain-S/P knee replacement this past monday

## 2010-12-24 NOTE — Discharge Planning (Signed)
ED CM noted Admission RN's consult to CM for pt who had been scheduled to receive services from [facility] care Pt had fallen and returned to ED. Confirmed Fleet Contras, SW has assisted with admission to [facility] for 12/24/10.  ED CM spoke with [facility] home care staff to update them Pt also updated that [facility] is aware and can be contacted if needed upon d/c from [facility]

## 2010-12-24 NOTE — H&P (Signed)
[name] is an 77 y.o. female.    Chief Complaint: Rib pain S/P fall, S/P right total knee arthroplasty  HPI: Pt is a 77 y.o. female complaining of right sided rib pain after sustaining a fall. Patient was recently discharged from the hospital to her home after having a right total knee arthroplasty. Patient was unsteady while up and fell, causing her to hit the rib on her right side. She was subsequently brought to the ER where x-rays were taken to evaluate the rib on the right side. She is complaining of increased pain of the ribs on the right side. Pt is to be admitted for pain control over the weekend and evaluation for placement of SNF on discharge.  PCP:  [name], MD  D/C Plans: SNF  PMH: Past Medical History  Diagnosis Date  . Hypertension   . Hypothyroid   . Goiter   . GERD (gastroesophageal reflux disease)   . Fibromyalgia   . PONV (postoperative nausea and vomiting)   . Heart murmur   . Peripheral vascular disease     varicose veins in left arm   . Shortness of breath     with exertion   . Anemia     hx of in 2003   . Hiatal hernia   . Headache     tension headache daily per pt   . Arthritis   . Colon cancer     s/p resecton of colon in 2003   . Depression     PSH: Past Surgical History  Procedure Date  . Tonsillectomy   . Tmj arthroplasty   . Back surgery   . Appendectomy   . Colon resection   . Eye surgery     bilateral cataract surgery   . Total knee arthroplasty 12/20/2010    Procedure: TOTAL KNEE ARTHROPLASTY;  Surgeon: Shelda Pal;  Location: [facility];  Service: Orthopedics;  Laterality: Right;  . Joint replacement     Social History:  reports that she has never smoked. She has never used smokeless tobacco. She reports that she does not use illicit drugs. Her alcohol history not on file.  Allergies:  Allergies  Allergen Reactions  . Latex Other (See Comments)    blister  . Lidocaine Nausea And Vomiting  . Sulfur Nausea And  Vomiting    Medications: Medications Prior to Admission  Medication Dose Route Frequency Provider Last Rate Last Dose  . ketorolac (TORADOL) 15 MG/ML injection 7.5 mg  7.5 mg Intravenous Q6H Genelle Gather [place], PA   7.5 mg at 12/23/10 0013  . DISCONTD: acetaminophen (TYLENOL) suppository 650 mg  650 mg Rectal Q6H PRN Shelda Pal      . DISCONTD: acetaminophen (TYLENOL) tablet 500 mg  500 mg Oral Daily Madlyn Frankel Olin   500 mg at 12/23/10 1012  . DISCONTD: acetaminophen (TYLENOL) tablet 650 mg  650 mg Oral Q6H PRN Shelda Pal   650 mg at 12/23/10 0459  . DISCONTD: alum & mag hydroxide-simeth (MAALOX/MYLANTA) 200-200-20 MG/5ML suspension 30 mL  30 mL Oral Q4H PRN Madlyn Frankel Olin   30 mL at 12/20/10 1707  . DISCONTD: amLODipine (NORVASC) tablet 5 mg  5 mg Oral Daily Shelda Pal   5 mg at 12/23/10 1014  . DISCONTD: aspirin tablet 325 mg  325 mg Oral QHS Shelda Pal   325 mg at 12/22/10 2108  . DISCONTD: docusate sodium (COLACE) capsule 100 mg  100 mg Oral BID Molli Hazard  D Olin   100 mg at 12/23/10 1012  . DISCONTD: estradiol (ESTRACE) tablet 0.25 mg  0.25 mg Oral Daily Madlyn Frankel Olin   0.25 mg at 12/23/10 1013  . DISCONTD: ferrous sulfate tablet 325 mg  325 mg Oral TID PC Madlyn Frankel Olin   325 mg at 12/23/10 1223  . DISCONTD: HYDROmorphone (DILAUDID) injection 0.5-1 mg  0.5-1 mg Intravenous Q2H PRN Shelda Pal   1 mg at 12/21/10 2221  . DISCONTD: levothyroxine (SYNTHROID, LEVOTHROID) tablet 112 mcg  112 mcg Oral Q24H Shelda Pal   112 mcg at 12/23/10 4540  . DISCONTD: medroxyPROGESTERone (PROVERA) tablet 2.5 mg  2.5 mg Oral Daily Madlyn Frankel Olin   2.5 mg at 12/23/10 1013  . DISCONTD: menthol-cetylpyridinium (CEPACOL) lozenge 3 mg  1 lozenge Oral PRN Shelda Pal      . DISCONTD: methocarbamol (ROBAXIN) 500 mg in dextrose 5 % 50 mL IVPB  500 mg Intravenous Q6H PRN Shelda Pal      . DISCONTD: methocarbamol (ROBAXIN) tablet 500 mg  500 mg Oral Q6H PRN Shelda Pal   500 mg at  12/21/10 9811  . DISCONTD: metoCLOPramide (REGLAN) injection 5-10 mg  5-10 mg Intravenous Q8H PRN Shelda Pal      . DISCONTD: metoCLOPramide (REGLAN) tablet 5-10 mg  5-10 mg Oral Q8H PRN Shelda Pal      . DISCONTD: multivitamins ther. w/minerals tablet 1 tablet  1 tablet Oral Daily Shelda Pal   1 tablet at 12/23/10 1014  . DISCONTD: niacin (SLO-NIACIN) CR tablet 1,000 mg  1,000 mg Oral QHS Madlyn Frankel Olin   1,000 mg at 12/22/10 2109  . DISCONTD: nortriptyline (PAMELOR) capsule 25 mg  25 mg Oral Daily Shelda Pal   25 mg at 12/23/10 1013  . DISCONTD: nortriptyline (PAMELOR) capsule 50 mg  50 mg Oral QHS Madlyn Frankel Olin   50 mg at 12/22/10 2109  . DISCONTD: ondansetron (ZOFRAN) injection 4 mg  4 mg Intravenous Q6H PRN Shelda Pal   4 mg at 12/21/10 0446  . DISCONTD: ondansetron (ZOFRAN) tablet 4 mg  4 mg Oral Q6H PRN Shelda Pal      . DISCONTD: pantoprazole (PROTONIX) EC tablet 40 mg  40 mg Oral Q1200 Shelda Pal   40 mg at 12/23/10 9147  . DISCONTD: phenol (CHLORASEPTIC) mouth spray 1 spray  1 spray Mouth/Throat PRN Shelda Pal      . DISCONTD: polyethylene glycol (MIRALAX / GLYCOLAX) packet 17 g  17 g Oral BID Madlyn Frankel Olin   17 g at 12/23/10 1012  . DISCONTD: polyvinyl alcohol (LIQUIFILM TEARS) 1.4 % ophthalmic solution 1 drop  1 drop Both Eyes BID PRN Shelda Pal      . DISCONTD: rivaroxaban (XARELTO) tablet 10 mg  10 mg Oral Q breakfast Shelda Pal   10 mg at 12/23/10 8295  . DISCONTD: simvastatin (ZOCOR) tablet 20 mg  20 mg Oral QHS Madlyn Frankel Olin   20 mg at 12/22/10 2108  . DISCONTD: sodium chloride 0.9 % 1,000 mL with potassium chloride 10 mEq infusion   Intravenous Continuous Shelda Pal 20 mL/hr at 12/22/10 6213    . DISCONTD: traMADol (ULTRAM) tablet 50-100 mg  50-100 mg Oral Q6H Genelle Gather [place], [place]   100 mg at 12/23/10 1223  . DISCONTD: vitamin B-12 (CYANOCOBALAMIN) tablet 500 mcg  500 mcg Oral Daily Madlyn Frankel Olin   500 mcg at  12/23/10 1012  .  DISCONTD: vitamin C (ASCORBIC ACID) tablet 500 mg  500 mg Oral Daily Madlyn Frankel Olin   500 mg at 12/23/10 1013  . DISCONTD: Vitamin D (Ergocalciferol) (DRISDOL) capsule 50,000 Units  50,000 Units Oral Q7 days Shelda Pal      . DISCONTD: vitamin E capsule 400 Units  400 Units Oral BID Shelda Pal   400 Units at 12/23/10 1013  . DISCONTD: zolpidem (AMBIEN) tablet 10 mg  10 mg Oral QHS PRN Shelda Pal   10 mg at 12/20/10 2218   Medications Prior to Admission  Medication Sig Dispense Refill  . acetaminophen (TYLENOL) 500 MG tablet Take 500 mg by mouth daily. Pt takes at 7pm      . amLODipine (NORVASC) 5 MG tablet Take 5 mg by mouth every morning.       Marland Kitchen aspirin 325 MG tablet Take 1 tablet (325 mg total) by mouth 2 (two) times daily.      . Calcium-Vitamin D (CALTRATE 600 PLUS-VIT D PO) Take 1 tablet by mouth once a week.       . capsaicin (ZOSTRIX) 0.025 % cream Apply 1 application topically daily. Pain       . docusate sodium 100 MG CAPS Take 100 mg by mouth 2 (two) times daily.      . ferrous sulfate 325 (65 FE) MG tablet Take 1 tablet (325 mg total) by mouth 3 (three) times daily after meals.      Marland Kitchen levothyroxine (SYNTHROID, LEVOTHROID) 112 MCG tablet Take 112 mcg by mouth every morning.       . medroxyPROGESTERone (PROVERA) 5 MG tablet Take 2.5 mg by mouth daily.       . methocarbamol (ROBAXIN) 500 MG tablet Take 1 tablet (500 mg total) by mouth every 6 (six) hours as needed.      . Multiple Vitamins-Minerals (MULTI COMPLETE PO) Take by mouth daily.       . niacin (NIASPAN) 1000 MG CR tablet Take 1,000 mg by mouth at bedtime.       . nortriptyline (PAMELOR) 25 MG capsule Take 25-50 mg by mouth 2 (two) times daily. 25 mg in the morning and 50 mg in the evening      . omeprazole (PRILOSEC) 20 MG capsule Take 20 mg by mouth daily.       Bertram Gala Glycol-Propyl Glycol (SYSTANE ULTRA) 0.4-0.3 % SOLN Apply 1 drop to eye 2 (two) times daily as needed. Dry eye      . polyethylene glycol  (MIRALAX / GLYCOLAX) packet Take 17 g by mouth 2 (two) times daily.  14 each    . risedronate (ACTONEL) 150 MG tablet Take 150 mg by mouth every 30 (thirty) days. with water on empty stomach, nothing by mouth or lie down for next 30 minutes.      . simvastatin (ZOCOR) 20 MG tablet Take 20 mg by mouth at bedtime.       . traMADol (ULTRAM) 50 MG tablet Take 1-2 tablets (50-100 mg total) by mouth every 6 (six) hours. Maximum dose= 8 tablets per day  100 tablet  0  . vitamin B-12 (CYANOCOBALAMIN) 500 MCG tablet Take 500 mcg by mouth daily.       . vitamin C (ASCORBIC ACID) 500 MG tablet Take 500 mg by mouth daily.       . Vitamin D, Ergocalciferol, (DRISDOL) 50000 UNITS CAPS Take 50,000 Units by mouth every 7 (seven) days. saturday      .  vitamin E 400 UNIT capsule Take 400 Units by mouth 2 (two) times daily.       Marland Kitchen zolpidem (AMBIEN) 10 MG tablet Take 10 mg by mouth at bedtime as needed. Sleep         Results for orders placed during the hospital encounter of 12/24/10 (from the past 48 hour(s))  URINALYSIS, ROUTINE W REFLEX MICROSCOPIC     Status: Normal   Collection Time   12/24/10  7:29 AM      Component Value Range Comment   Color, Urine YELLOW  YELLOW     APPearance CLEAR  CLEAR     Specific Gravity, Urine 1.014  1.005 - 1.030     pH 6.5  5.0 - 8.0     Glucose, UA NEGATIVE  NEGATIVE (mg/dL)    Hgb urine dipstick NEGATIVE  NEGATIVE     Bilirubin Urine NEGATIVE  NEGATIVE     Ketones, ur NEGATIVE  NEGATIVE (mg/dL)    Protein, ur NEGATIVE  NEGATIVE (mg/dL)    Urobilinogen, UA 0.2  0.0 - 1.0 (mg/dL)    Nitrite NEGATIVE  NEGATIVE     Leukocytes, UA NEGATIVE  NEGATIVE  MICROSCOPIC NOT DONE ON URINES WITH NEGATIVE PROTEIN, BLOOD, LEUKOCYTES, NITRITE, OR GLUCOSE <1000 mg/dL.  CBC     Status: Abnormal   Collection Time   12/24/10  7:37 AM      Component Value Range Comment   WBC 10.8 (*) 4.0 - 10.5 (K/uL)    RBC 2.47 (*) 3.87 - 5.11 (MIL/uL)    Hemoglobin 8.6 (*) 12.0 - 15.0 (g/dL)    HCT  16.1 (*) 09.6 - 46.0 (%)    MCV 102.8 (*) 78.0 - 100.0 (fL)    MCH 34.8 (*) 26.0 - 34.0 (pg)    MCHC 33.9  30.0 - 36.0 (g/dL)    RDW 04.5  40.9 - 81.1 (%)    Platelets 178  150 - 400 (K/uL)   POCT I-STAT, CHEM 8     Status: Abnormal   Collection Time   12/24/10  7:49 AM      Component Value Range Comment   Sodium 134 (*) 135 - 145 (mEq/L)    Potassium 3.9  3.5 - 5.1 (mEq/L)    Chloride 97  96 - 112 (mEq/L)    BUN 8  6 - 23 (mg/dL)    Creatinine, Ser 9.14  0.50 - 1.10 (mg/dL)    Glucose, Bld 782 (*) 70 - 99 (mg/dL)    Calcium, Ion 9.56  1.12 - 1.32 (mmol/L)    TCO2 28  0 - 100 (mmol/L)    Hemoglobin 8.5 (*) 12.0 - 15.0 (g/dL)    HCT 21.3 (*) 08.6 - 46.0 (%)    Dg Ribs Unilateral W/chest Right  12/24/2010  *RADIOLOGY REPORT*  Clinical Data: Fall, right-sided pain.  RIGHT RIBS AND CHEST - 3+ VIEW  Comparison: 12/15/2010  Findings: Heart is normal size.  Slight bibasilar atelectasis. Stable slight interstitial prominence.  No effusions or confluent consolidation.  No acute bony abnormality.  IMPRESSION: Stable slight interstitial prominence.  Bibasilar atelectasis.  Original Report Authenticated By: Cyndie Chime, M.D.    ROS: Review of Systems  Constitutional: Positive for malaise/fatigue. Negative for fever, chills, weight loss and diaphoresis.  HENT: Negative.   Eyes: Negative.   Respiratory: Negative.   Cardiovascular: Negative.   Gastrointestinal: Negative.   Genitourinary: Positive for flank pain.  Musculoskeletal: Positive for joint pain and falls.  Skin: Negative.   Neurological:  Positive for weakness.  Endo/Heme/Allergies: Negative.   Psychiatric/Behavioral: Negative.      Physican Exam: Blood pressure 139/66, pulse 113, temperature 97.6 F (36.4 C), temperature source Oral, resp. rate 20, height 5\' 6"  (1.676 m), weight 68.04 kg (150 lb), SpO2 94.00%.  Physical Exam  Constitutional: No distress.  HENT:  Head: Normocephalic and atraumatic.  Eyes: Conjunctivae and  EOM are normal. Pupils are equal, round, and reactive to light.  Neck: Neck supple. No JVD present. No tracheal deviation present. No thyromegaly present.  Cardiovascular: Normal rate and regular rhythm.   Pulmonary/Chest: Breath sounds normal. No stridor. No respiratory distress. She has no wheezes. She exhibits tenderness (ribs right side).  Abdominal: Bowel sounds are normal. There is no tenderness. There is no guarding.  Musculoskeletal: She exhibits tenderness (ribs on right side, right knee (not from fall but from S/P right TKA).  Lymphadenopathy:    She has no cervical adenopathy.  Skin: Skin is warm and dry. She is not diaphoretic.      Assessment/Plan Assessment: Rib pain S/P fall, S/P right total knee arthroplasty  Plan: Patient will be admitted for pain management of the rib and knee pain over the weekend. Social worker consult for placement into SNF when discharged.   Anastasio Auerbach [facility] [place]   PAC  12/24/2010, 8:42 AM

## 2010-12-24 NOTE — ED Notes (Signed)
Social Work in to see pt. Pt has been approved at [employer] [facility]. SW in speaking with pt.

## 2010-12-24 NOTE — ED Notes (Signed)
Patient transported to X-ray 

## 2010-12-24 NOTE — ED Notes (Signed)
Per ems patient fell at home backwards after trying to go to the [facility].  Patient has right leg in a full length splint after a total knee replacement Monday.  Denies LOC or head trauma.  Patient per ems landed fanny first on a nearby stool.

## 2010-12-24 NOTE — ED Notes (Signed)
Pt returned from radiology.

## 2010-12-24 NOTE — ED Notes (Signed)
[facility] contacted to transport pt to [facility]. Son will meet pt at [facility].

## 2010-12-24 NOTE — Discharge Planning (Signed)
Provided pt with copies of contact information for [facility] and [facility]

## 2011-01-18 HISTORY — PX: KYPHOPLASTY: SHX5884

## 2011-01-21 ENCOUNTER — Other Ambulatory Visit (HOSPITAL_COMMUNITY): Payer: Self-pay | Admitting: Orthopedic Surgery

## 2011-01-21 ENCOUNTER — Other Ambulatory Visit: Payer: Self-pay | Admitting: Rheumatology

## 2011-01-21 DIAGNOSIS — IMO0002 Reserved for concepts with insufficient information to code with codable children: Secondary | ICD-10-CM

## 2011-01-21 DIAGNOSIS — M549 Dorsalgia, unspecified: Secondary | ICD-10-CM

## 2011-01-24 ENCOUNTER — Other Ambulatory Visit (HOSPITAL_COMMUNITY): Payer: Self-pay | Admitting: Orthopedic Surgery

## 2011-01-24 DIAGNOSIS — IMO0002 Reserved for concepts with insufficient information to code with codable children: Secondary | ICD-10-CM

## 2011-01-24 DIAGNOSIS — M2580 Other specified joint disorders, unspecified joint: Secondary | ICD-10-CM

## 2011-01-25 ENCOUNTER — Ambulatory Visit (HOSPITAL_COMMUNITY)
Admission: RE | Admit: 2011-01-25 | Discharge: 2011-01-25 | Disposition: A | Discharge status: Home / Self Care | Payer: Medicare Other | Source: Ambulatory Visit | Attending: Orthopedic Surgery | Admitting: Orthopedic Surgery

## 2011-01-25 ENCOUNTER — Encounter (HOSPITAL_COMMUNITY)
Admission: RE | Admit: 2011-01-25 | Discharge: 2011-01-25 | Disposition: A | Discharge status: Home / Self Care | Payer: Medicare Other | Source: Ambulatory Visit | Attending: Orthopedic Surgery | Admitting: Orthopedic Surgery

## 2011-01-25 ENCOUNTER — Encounter (HOSPITAL_COMMUNITY): Payer: Self-pay

## 2011-01-25 DIAGNOSIS — R935 Abnormal findings on diagnostic imaging of other abdominal regions, including retroperitoneum: Secondary | ICD-10-CM | POA: Insufficient documentation

## 2011-01-25 DIAGNOSIS — X58XXXA Exposure to other specified factors, initial encounter: Secondary | ICD-10-CM | POA: Insufficient documentation

## 2011-01-25 DIAGNOSIS — Z85038 Personal history of other malignant neoplasm of large intestine: Secondary | ICD-10-CM | POA: Insufficient documentation

## 2011-01-25 DIAGNOSIS — M2580 Other specified joint disorders, unspecified joint: Secondary | ICD-10-CM

## 2011-01-25 DIAGNOSIS — IMO0002 Reserved for concepts with insufficient information to code with codable children: Secondary | ICD-10-CM

## 2011-01-25 DIAGNOSIS — K8020 Calculus of gallbladder without cholecystitis without obstruction: Secondary | ICD-10-CM | POA: Insufficient documentation

## 2011-01-25 MED ORDER — TECHNETIUM TC 99M MEDRONATE IV KIT
25.0000 | PACK | Freq: Once | INTRAVENOUS | Status: AC | PRN
  Administered 2011-01-25: 25 via INTRAVENOUS

## 2011-02-02 ENCOUNTER — Encounter (HOSPITAL_COMMUNITY): Payer: Medicare Other

## 2011-02-02 ENCOUNTER — Ambulatory Visit (HOSPITAL_COMMUNITY): Payer: Medicare Other

## 2011-02-04 ENCOUNTER — Telehealth: Payer: Self-pay | Admitting: Oncology

## 2011-02-04 NOTE — Telephone Encounter (Signed)
Talked to pt gave her appt date for 02/23/11

## 2011-02-07 ENCOUNTER — Other Ambulatory Visit: Payer: Self-pay | Admitting: Orthopedic Surgery

## 2011-02-07 DIAGNOSIS — IMO0002 Reserved for concepts with insufficient information to code with codable children: Secondary | ICD-10-CM

## 2011-02-08 ENCOUNTER — Ambulatory Visit
Admission: RE | Admit: 2011-02-08 | Discharge: 2011-02-08 | Disposition: A | Discharge status: Home / Self Care | Payer: Medicare Other | Source: Ambulatory Visit | Attending: Orthopedic Surgery | Admitting: Orthopedic Surgery

## 2011-02-08 ENCOUNTER — Other Ambulatory Visit: Payer: Self-pay | Admitting: Orthopedic Surgery

## 2011-02-08 ENCOUNTER — Encounter: Payer: Self-pay | Admitting: *Deleted

## 2011-02-08 DIAGNOSIS — IMO0002 Reserved for concepts with insufficient information to code with codable children: Secondary | ICD-10-CM

## 2011-02-08 MED ORDER — MIDAZOLAM HCL 2 MG/2ML IJ SOLN
1.0000 mg | INTRAMUSCULAR | Status: DC | PRN
  Administered 2011-02-09 (×3): 1 mg via INTRAVENOUS

## 2011-02-08 MED ORDER — FENTANYL CITRATE 0.05 MG/ML IJ SOLN
25.0000 ug | INTRAMUSCULAR | Status: DC | PRN
  Administered 2011-02-09 (×2): 50 ug via INTRAVENOUS

## 2011-02-08 MED ORDER — CEFAZOLIN SODIUM 1-5 GM-% IV SOLN
1.0000 g | Freq: Once | INTRAVENOUS | Status: AC
  Administered 2011-02-09: 1 g via INTRAVENOUS

## 2011-02-08 MED ORDER — SODIUM CHLORIDE 0.9 % IV SOLN
Freq: Once | INTRAVENOUS | Status: AC
  Administered 2011-02-09 (×2): via INTRAVENOUS

## 2011-02-08 MED ORDER — KETOROLAC TROMETHAMINE 30 MG/ML IJ SOLN
30.0000 mg | Freq: Once | INTRAMUSCULAR | Status: AC
  Administered 2011-02-09: 30 mg via INTRAVENOUS

## 2011-02-08 NOTE — Consult Note (Signed)
Reason for Consult:T11 Compression Fracture Referring Physician: Dr. Ernie [name] is an 78 y.o. female.  HPI: Thoracolumbar pain following a fall 12/24/11.  Initial chest X-ray did not reveal the fracture.  She previously was able cook, clean and keep he house in order by herself.  She was able to dress and care for herself.  She is now confined to bed because of the pain.  She reports her pain as 10/10 on a VPS.  Her [name] disability score is 23/24.  The pain is somewhat controlled with Oxycodone/APAP 10/650, 1/2 tab q 4-6 hours prn.  She is mostly confined to bed and states the pain has not improved.  She has some pain to her right lower ribs, but no other radicular symptoms.  She has no cord symptoms.  Past Medical History  Diagnosis Date  . Hypertension   . Hypothyroid   . Goiter   . GERD (gastroesophageal reflux disease)   . Fibromyalgia   . PONV (postoperative nausea and vomiting)   . Heart murmur   . Peripheral vascular disease     varicose veins in left arm   . Shortness of breath     with exertion   . Anemia     hx of in 2003   . Hiatal hernia   . Headache     tension headache daily per pt   . Arthritis   . Depression   . DJD (degenerative joint disease)   . Osteoarthritis   . Colon cancer     s/p resecton of colon in 2003 , hx of chemo    Past Surgical History  Procedure Date  . Tonsillectomy   . Tmj arthroplasty   . Back surgery   . Appendectomy   . Colon resection   . Eye surgery     bilateral cataract surgery   . Total knee arthroplasty 12/20/2010    Procedure: TOTAL KNEE ARTHROPLASTY;  Surgeon: [name];  Location: [facility];  Service: Orthopedics;  Laterality: Right;  . Joint replacement   . Colonoscopy w/ biopsies 09/25/2002, 09/18/2001  . Upper gastrointestinal endoscopy 09/18/2001  . Porta catheter placement 10/30/2001  . Porta catheter removal 06/27/2003    Family History  Problem Relation Age of Onset  . Heart disease Mother       Enlarged heart  . Arrhythmia Mother   . Diabetes Mother   . Stroke Sister   . Heart disease Brother     MI at unknown age  . Anesthesia problems Brother   . Heart attack      MI at age 43  . Heart disease Brother     CABG    Social History:  reports that she has never smoked. She has never used smokeless tobacco. She reports that she does not drink alcohol or use illicit drugs.  She is divorced and lives with her son.  Allergies:  Allergies  Allergen Reactions  . Latex Other (See Comments)    blister  . Lidocaine Nausea And Vomiting  . Sulfur Nausea And Vomiting    Medications: I have reviewed the patient's current medications.  No results found for this or any previous visit (from the past 48 hour(s)).  No results found.  Review of Systems  Constitutional: Negative.   HENT: Negative.   Eyes: Negative.   Respiratory: Positive for shortness of breath.   Cardiovascular: Negative.        Heart Murmur  Gastrointestinal: Positive for heartburn.  Genitourinary:  Negative.   Musculoskeletal: Positive for myalgias and back pain.  Skin: Negative.   Neurological: Negative.        Difficulty Concentrating.  Endo/Heme/Allergies: Negative.   Psychiatric/Behavioral: Negative.    Blood pressure 152/82, pulse 102, temperature 92.8 F (33.8 C), temperature source Oral. Physical Exam:  Tender to palpation over the upper thoracic spine.  No significant tenderness at level of T11 fracture.  Assessment/Plan: Osteoporotic T11 Compression Fracture with prolonged healing and having significant limitation on activities of daily living.  Discussed risks/benefits of vertebroplasty.  Answered all questions.  The patient and her son wish to proceed as soon as possible.  This has been scheduled with Dr. Benard [name] for 02/09/11.  [name] W 02/08/2011, 4:43 PM

## 2011-02-09 ENCOUNTER — Other Ambulatory Visit: Payer: Medicare Other

## 2011-02-09 ENCOUNTER — Ambulatory Visit
Admission: RE | Admit: 2011-02-09 | Discharge: 2011-02-09 | Disposition: A | Discharge status: Home / Self Care | Payer: Medicare Other | Source: Ambulatory Visit | Attending: Orthopedic Surgery | Admitting: Orthopedic Surgery

## 2011-02-09 DIAGNOSIS — IMO0002 Reserved for concepts with insufficient information to code with codable children: Secondary | ICD-10-CM

## 2011-02-09 DIAGNOSIS — S22000A Wedge compression fracture of unspecified thoracic vertebra, initial encounter for closed fracture: Secondary | ICD-10-CM

## 2011-02-09 NOTE — Progress Notes (Addendum)
Pt states she has a heart murmur about 4 years, heart sounds are irregular and lungs have a few scattered crackles in the bases.  [phone] pt positioned on table and is fairly comfortable at present, except for right shoulder. O2 applied 1l/[place].  1011 procedure complete, pt tolerated procedure well  1143  Pt up to walk with walker, moving much better at present, able to walk down hall with walker for upright films

## 2011-02-10 ENCOUNTER — Telehealth: Payer: Self-pay | Admitting: Radiology

## 2011-02-10 ENCOUNTER — Telehealth (INDEPENDENT_AMBULATORY_CARE_PROVIDER_SITE_OTHER): Payer: Self-pay | Admitting: Surgery

## 2011-02-10 NOTE — Telephone Encounter (Signed)
Pt was feeling better, moving better and did not need her brace when she was sitting up.

## 2011-02-10 NOTE — Telephone Encounter (Signed)
Pt would like a sooner appt if possible

## 2011-02-15 ENCOUNTER — Telehealth: Payer: Self-pay | Admitting: Radiology

## 2011-02-16 ENCOUNTER — Telehealth: Payer: Self-pay | Admitting: Radiology

## 2011-02-16 NOTE — Telephone Encounter (Signed)
Pt had been asked to f/u with her primary care physician re: her actonel as she had a compression fx. Son thought we were going to call her about this. I explained that they would have to contact the primary care physician for this.

## 2011-02-23 ENCOUNTER — Ambulatory Visit (HOSPITAL_BASED_OUTPATIENT_CLINIC_OR_DEPARTMENT_OTHER): Payer: Medicare Other | Admitting: Oncology

## 2011-02-23 ENCOUNTER — Telehealth: Payer: Self-pay | Admitting: Oncology

## 2011-02-23 ENCOUNTER — Other Ambulatory Visit: Payer: Medicare Other | Admitting: Lab

## 2011-02-23 VITALS — BP 138/80 | HR 98 | Temp 97.0°F | Ht 66.0 in | Wt 141.0 lb

## 2011-02-23 DIAGNOSIS — D649 Anemia, unspecified: Secondary | ICD-10-CM

## 2011-02-23 DIAGNOSIS — R1900 Intra-abdominal and pelvic swelling, mass and lump, unspecified site: Secondary | ICD-10-CM

## 2011-02-23 DIAGNOSIS — Z85038 Personal history of other malignant neoplasm of large intestine: Secondary | ICD-10-CM

## 2011-02-23 LAB — CBC WITH DIFFERENTIAL/PLATELET
Basophils Absolute: 0 10*3/uL (ref 0.0–0.1)
Eosinophils Absolute: 0.3 10*3/uL (ref 0.0–0.5)
HGB: 13.8 g/dL (ref 11.6–15.9)
MCV: 103.8 fL — ABNORMAL HIGH (ref 79.5–101.0)
MONO#: 0.9 10*3/uL (ref 0.1–0.9)
MONO%: 12.6 % (ref 0.0–14.0)
NEUT#: 4.8 10*3/uL (ref 1.5–6.5)
RDW: 13.5 % (ref 11.2–14.5)

## 2011-02-23 LAB — COMPREHENSIVE METABOLIC PANEL
Albumin: 4 g/dL (ref 3.5–5.2)
CO2: 25 mEq/L (ref 19–32)
Calcium: 10.5 mg/dL (ref 8.4–10.5)
Chloride: 101 mEq/L (ref 96–112)
Glucose, Bld: 133 mg/dL — ABNORMAL HIGH (ref 70–99)
Potassium: 5 mEq/L (ref 3.5–5.3)
Sodium: 139 mEq/L (ref 135–145)
Total Protein: 7.2 g/dL (ref 6.0–8.3)

## 2011-02-23 NOTE — Progress Notes (Signed)
[facility] OFFICE PROGRESS NOTE  Cc:  [name], MD, MD  DIAGNOSIS:  History of stage IIIB pT3 N1 M0 adenocarcinoma of the right colon.  PAST THERAPY:  She had resection in September 2003.  She received adjuvant chemotherapy with 5-FU, irinotecan, leucovorin which finished in April 2004 by Dr. Arlan Organ.  Last surveillance colonoscopy with Dr. Ewing Schlein was reportedly negative in September 2012.  I'm requesting these record for confirmation.   CURRENT THERAPY:  Watchful observation.  INTERVAL HISTORY: [name] 78 y.o. female returns for regular follow up.  She recently had kyphoplasty due to compression fracture .  She had a right total knee anrthroplasty in Dec 2012 with Dr. Charlann Boxer.  She complained of rib pain from a fall late last year and had low rib pain.  She had abdominal CT on 01/25/2011 which I personally reviewed myself which showed coarse calcification of concern is contiguous with the suture  line involving the ascending colon and is likely scar/suture granuloma.  In addition, there was a 2.5 cm irregular soft tissue mass  posterior and inferior to the calcification.  She has been having back pain and knee pain despite recent procedures.  She can ambulate with assistance of a cane for short distance.  With long distance, she needs wheelchair.  She has mild fatigue and slight decrease in appetite last few months due to bone issues.   Patient denies headache, visual changes, confusion, drenching night sweats, palpable lymph node swelling, mucositis, odynophagia, dysphagia, nausea vomiting, jaundice, chest pain, palpitation, shortness of breath, dyspnea on exertion, productive cough, gum bleeding, epistaxis, hematemesis, hemoptysis, abdominal pain, abdominal swelling, early satiety, melena, hematochezia, hematuria, skin rash, spontaneous bleeding, joint swelling, joint pain, heat or cold intolerance, bowel bladder incontinence,paresthesia, depression, suicidal or  homocidal ideation, feeling hopelessness.   MEDICAL HISTORY: Past Medical History  Diagnosis Date  . Hypertension   . Hypothyroid   . Goiter   . GERD (gastroesophageal reflux disease)   . Fibromyalgia   . PONV (postoperative nausea and vomiting)   . Heart murmur   . Peripheral vascular disease     varicose veins in left arm   . Shortness of breath     with exertion   . Anemia     hx of in 2003   . Hiatal hernia   . Headache     tension headache daily per pt   . Arthritis   . Depression   . DJD (degenerative joint disease)   . Osteoarthritis   . Colon cancer     s/p resecton of colon in 2003 , hx of chemo    SURGICAL HISTORY:  Past Surgical History  Procedure Date  . Tonsillectomy   . Tmj arthroplasty   . Back surgery   . Appendectomy   . Colon resection   . Eye surgery     bilateral cataract surgery   . Total knee arthroplasty 12/20/2010    Procedure: TOTAL KNEE ARTHROPLASTY;  Surgeon: Shelda Pal;  Location: [facility];  Service: Orthopedics;  Laterality: Right;  . Joint replacement   . Colonoscopy w/ biopsies 09/25/2002, 09/18/2001  . Upper gastrointestinal endoscopy 09/18/2001  . Porta catheter placement 10/30/2001  . Porta catheter removal 06/27/2003    MEDICATIONS: Current Outpatient Prescriptions  Medication Sig Dispense Refill  . acetaminophen (TYLENOL) 500 MG tablet Take 500 mg by mouth daily. Pt takes at 7pm      . amLODipine (NORVASC) 5 MG tablet Take 5 mg by mouth  every morning.       Marland Kitchen aspirin 325 MG tablet Take 1 tablet (325 mg total) by mouth 2 (two) times daily.      . Calcium-Vitamin D (CALTRATE 600 PLUS-VIT D PO) Take 1 tablet by mouth once a week.       . capsaicin (ZOSTRIX) 0.025 % cream Apply 1 application topically daily. Pain       . levothyroxine (SYNTHROID, LEVOTHROID) 112 MCG tablet Take 112 mcg by mouth every morning.       . medroxyPROGESTERone (PROVERA) 5 MG tablet Take 2.5 mg by mouth daily.       . Multiple Vitamins-Minerals (MULTI  COMPLETE PO) Take by mouth daily.       . niacin (NIASPAN) 1000 MG CR tablet Take 1,000 mg by mouth at bedtime.       . nortriptyline (PAMELOR) 25 MG capsule Take 25-50 mg by mouth 2 (two) times daily. 25 mg in the morning and 50 mg in the evening      . omeprazole (PRILOSEC) 20 MG capsule Take 20 mg by mouth daily.       Bertram Gala Glycol-Propyl Glycol (SYSTANE ULTRA) 0.4-0.3 % SOLN Apply 1 drop to eye 2 (two) times daily as needed. Dry eye      . polyethylene glycol (MIRALAX / GLYCOLAX) packet Take 17 g by mouth 2 (two) times daily.      . risedronate (ACTONEL) 150 MG tablet Take 150 mg by mouth every 30 (thirty) days. with water on empty stomach, nothing by mouth or lie down for next 30 minutes.      . simvastatin (ZOCOR) 20 MG tablet Take 20 mg by mouth at bedtime.       . vitamin B-12 (CYANOCOBALAMIN) 500 MCG tablet Take 500 mcg by mouth daily.       . vitamin C (ASCORBIC ACID) 500 MG tablet Take 500 mg by mouth daily.       . Vitamin D, Ergocalciferol, (DRISDOL) 50000 UNITS CAPS Take 50,000 Units by mouth every 7 (seven) days. saturday      . vitamin E 400 UNIT capsule Take 400 Units by mouth 2 (two) times daily.       Marland Kitchen zolpidem (AMBIEN) 10 MG tablet Take 10 mg by mouth at bedtime as needed. Sleep       . methocarbamol (ROBAXIN) 500 MG tablet Take 500 mg by mouth every 6 (six) hours as needed.      Marland Kitchen oxyCODONE-acetaminophen (PERCOCET) 10-650 MG per tablet Take 1 tablet by mouth every 4 (four) hours as needed.        ALLERGIES:  is allergic to latex; lidocaine; and sulfur.  REVIEW OF SYSTEMS:  The rest of the 14-point review of system was negative.   Filed Vitals:   02/23/11 0837  BP: 138/80  Pulse: 98  Temp: 97 F (36.1 C)   Wt Readings from Last 3 Encounters:  02/23/11 141 lb (63.957 kg)  02/24/10 152 lb 14.4 oz (69.355 kg)  12/24/10 150 lb (68.04 kg)   ECOG Performance status: 1-2 due to back and knee pain.   PHYSICAL EXAMINATION:   General: thin-appearing woman in no  acute distress.  Eyes:  no scleral icterus.  ENT:  There were no oropharyngeal lesions.  Neck was without thyromegaly.  Lymphatics:  Negative cervical, supraclavicular or axillary adenopathy.  Respiratory: lungs were clear bilaterally without wheezing or crackles.  Cardiovascular:  Regular rate and rhythm, S1/S2, without rub or gallop.  There was a  III/VI systolic murmur in left upper sternal border without radiation. There was no pedal edema.  GI:  abdomen was soft, flat, nontender, nondistended, without organomegaly.  Muscoloskeletal:  no spinal tenderness of palpation of vertebral spine.  Skin exam was without echymosis, petichae.  Neuro exam was nonfocal.  Patient needed help to get on and off exam table.  Gait was unsteady; and she needed a cane.  Patient was alerted and oriented.  Attention was good.   Language was appropriate.  Mood was normal without depression.  Speech was not pressured.  Thought content was not tangential.    LABORATORY/RADIOLOGY DATA:  Lab Results  Component Value Date   WBC 7.1 02/23/2011   HGB 13.8 02/23/2011   HCT 41.3 02/23/2011   PLT 286 02/23/2011   GLUCOSE 152* 12/24/2010   ALT 15 12/15/2010   AST 22 12/15/2010   NA 134* 12/24/2010   K 3.9 12/24/2010   CL 97 12/24/2010   CREATININE 0.70 12/24/2010   BUN 8 12/24/2010   CO2 27 12/22/2010   INR 1.00 12/15/2010    Ct Abdomen Wo Contrast  01/25/2011  *RADIOLOGY REPORT*  Clinical Data:  Calcification to the right of L3-L4 seen on plain films; low back pain after fall; history of colon cancer  CT ABDOMEN WITHOUT CONTRAST  Technique:  Multidetector CT imaging of the abdomen was performed following the standard protocol without IV contrast.  Comparison:  January 12, 2011 plain films and August 19, 2004 CT scan  Findings: Within the right mid abdomen, there is coarse, dystrophic calcification which is contiguous with the suture line involving the ascending colon.  Posterior and inferior to the dystrophic calcification is a 2.5 cm  focus of irregular soft tissue.   No adenopathy, pneumoperitoneum, or free fluid are identified within the abdomen .  The lung bases are clear.  There are coronary artery and aorta iliac atherosclerotic calcifications.  Cholelithiasis is noted. The liver, spleen, pancreas, adrenal glands, kidneys have an unremarkable noncontrasted appearance.  There are degenerative changes within the axial spine with osteophytosis and facet joint hypertrophy.  There is neural foraminal narrowing at L4-L5 and L1- L2.  There is triangulation of the central canal at L2-L3, L3-L4, and L4-L5.  There is 50% collapse of the T11 vertebral body with osseous encroachment upon the anterior thecal sac.  IMPRESSION: The coarse calcification of concern is contiguous with the suture line involving the ascending colon and is likely  scar/suture granuloma; however, there is a 2.5 cm irregular soft tissue mass posterior and inferior to the calcification. Though this likely also represents scarring and postsurgical change, with history of colon cancer, recurrent neoplasm cannot be excluded.  PET scan is recommended to evaluate for hypermetabolic activity in this location.  Cholelithiasis.  50% collapse of the T11 vertebral body.  Central canal narrowing and neural foraminal narrowing at multiple lumbar levels.  Original Report Authenticated By: Brandon Melnick, M.D.   Nm Bone Scan Whole Body  01/25/2011  *RADIOLOGY REPORT*  Clinical Data: Fracture of vertebra  NUCLEAR MEDICINE WHOLE BODY BONE SCINTIGRAPHY  Technique:  Whole body anterior and posterior images were obtained approximately 3 hours after intravenous injection of radiopharmaceutical.  Radiopharmaceutical: 25 mCi technetium 32m MDP  Comparison: CT abdomen pelvis dated 01/25/2011  Findings: Increased uptake involving a mid/lower thoracic vertebral body, corresponding to the moderate T11 compression fracture noted on CT.  Uptake within the right knee, likely reflecting periprosthetic uptake  around a recent right knee replacement.  No increased radiotracer  uptake specific for metastatic disease.  Excretory radiotracer in the bladder.  IMPRESSION: Increased uptake at T11, corresponding to moderate compression fracture on CT.  Periprosthetic uptake around a recent right knee replacement.  No increased radiotracer uptake specific for metastatic disease.  Original Report Authenticated By: Charline Bills, M.D.   Dg Radiologist Eval And Mgmt  02/08/2011  Office/Outpatient New Patient  - Level II 16109  02/08/2011 10:12:00  Referring physician: Dr. Darrelyn Hillock  Reason for Consult/Chief Complaint:  Back pain.  T11 compression fracture.  Please see the full dictated note in EPIC.  Data Review:  I have reviewed her CT scan of the abdomen and nuclear medicine bone scan of 01/25/2011.  IMPRESSIONS:  Symptomatic osteoporotic T11 compression fracture.  Per CMS PQRS reporting requirements (PQRS Measure 24): Given the patient's age of greater than 50 and the fracture site (hip, distal radius, or spine), the patient should be tested for osteoporosis using DXA, and the appropriate treatment considered based on the DXA results.  PLAN:  I have discussed the risks and benefits of vertebroplasty with the patient and her son.  I answered all questions.  They wish to proceed as soon as possible.  This has been scheduled for 02/09/2011 with Dr. Benard Rink.  Original Report Authenticated By: Jamesetta Orleans. MATTERN, M.D.   Dg Epidural Veno/verte Bropl  02/09/2011  *RADIOLOGY REPORT*  Clinical Data:  Patient with post traumatic osteoporotic painful compression fracture at T11.  VERTEBROPLASTY AT T11: Following a full explanation of the procedure along with the potentially associated complications, an informed witnessed consent was obtained. The patient was placed prone on the fluoroscopic table.   The skin overlying the lower thoracic region was prepped and draped in the usual sterile fashion.  The T11 vertebral body was identified  and the right pedicle was infiltrated with 1% tetracaine.  This was then followed by the advancement of a 13-gauge Stryker needle through the pedicle. At this time, methylmethacrylate mixture was reconstituted   in the Stryker delivery device system.  Using biplane intermittent fluoroscopy, the methylmethacrylate mixture was then injected into the T11 vertebral body with filling of the vertebral body and crossing of the midline. A small amount of methylmethacrylate mixture was seen projecting into the T10-T11 disc space. There was no extension of methylmethacrylate into the foramen or spinal canal. The needle was then restyletted and removed.  Hemostasis was achieved at the skin entry site. There were no acute complications.  Patient tolerated the procedure well.  The patient was improved at the time of discharge. Total Moderate Sedation Time:  25 minutes. Fluoroscopic time 2.10 minutes IMPRESSION: Status post vertebral body augmentation for painful compression fracture at T11 using vertebroplasty technique. Per CMS PQRS reporting requirements (PQRS Measure 24): Given the patient's age of greater than 50 and the fracture site (hip, distal radius, or spine), the patient should be tested for osteoporosis using DXA, and the appropriate treatment considered based on the DXA results.  Original Report Authenticated By: Elsie Stain, M.D.    ASSESSMENT AND PLAN:   1.  History of stage IIIB colon cancer, status post resection and adjuvant chemotherapy.  I discussed with patient that per clinical history and lab, there is no convincing evidence of colon cancer recurrence or metastatic disease.  However, recent CT abdomen obtained for other reasons showed a 2.5 cm soft tissue mass near the anastomosis site.  This is more likely due to scar formation; but we need to rule out recurrence.  Since it is not an  easy place to biopsy.  I requested a PET scan for further characterization.  If it is active on PET, then there  will be more concern to pursue aggressive biopsy and therapy.  It is not PET active, then I will recommend follow up CT abdomen in about  4-6 months.  2.  Hypertension:  Relatively well controlled on amlodipine per PCP. 3.  Hypothyroidism:  She is on levothyroxine per PCP. 4.  Hyperlipidemia:  She is on niacin and simvastatin per PCP. 5.  Osteoporosis:  She is on calcium and vitamin D and risedronate per PCP.   6.  Osteoarthritis:  She is on pain medication per PCP.  7.  Follow up:  She has appointment with Dr. Daphine Deutscher this month.  I'll discuss the result of PET scan with her face to face if positive.  Otherwise, if PET is negative, I'll see her in about 4-50months with CT abdomen the day prior.   The length of time of the face-to-face encounter was 15 minutes. More than 50% of time was spent counseling and coordination of care.

## 2011-02-23 NOTE — Telephone Encounter (Signed)
appts made and printed for pt aom °

## 2011-02-24 ENCOUNTER — Telehealth: Payer: Self-pay | Admitting: Radiology

## 2011-03-03 ENCOUNTER — Encounter: Payer: Self-pay | Admitting: *Deleted

## 2011-03-04 ENCOUNTER — Encounter (HOSPITAL_COMMUNITY)
Admission: RE | Admit: 2011-03-04 | Discharge: 2011-03-04 | Disposition: A | Discharge status: Home / Self Care | Payer: Medicare Other | Source: Ambulatory Visit | Attending: Oncology | Admitting: Oncology

## 2011-03-04 ENCOUNTER — Encounter (HOSPITAL_COMMUNITY): Payer: Self-pay

## 2011-03-04 DIAGNOSIS — K5730 Diverticulosis of large intestine without perforation or abscess without bleeding: Secondary | ICD-10-CM | POA: Insufficient documentation

## 2011-03-04 DIAGNOSIS — R1909 Other intra-abdominal and pelvic swelling, mass and lump: Secondary | ICD-10-CM | POA: Insufficient documentation

## 2011-03-04 DIAGNOSIS — M549 Dorsalgia, unspecified: Secondary | ICD-10-CM | POA: Insufficient documentation

## 2011-03-04 DIAGNOSIS — R109 Unspecified abdominal pain: Secondary | ICD-10-CM | POA: Insufficient documentation

## 2011-03-04 DIAGNOSIS — Z85038 Personal history of other malignant neoplasm of large intestine: Secondary | ICD-10-CM | POA: Insufficient documentation

## 2011-03-04 DIAGNOSIS — D649 Anemia, unspecified: Secondary | ICD-10-CM

## 2011-03-04 LAB — GLUCOSE, CAPILLARY: Glucose-Capillary: 157 mg/dL — ABNORMAL HIGH (ref 70–99)

## 2011-03-04 MED ORDER — FLUDEOXYGLUCOSE F - 18 (FDG) INJECTION
17.4000 | Freq: Once | INTRAVENOUS | Status: AC | PRN
  Administered 2011-03-04: 17.4 via INTRAVENOUS

## 2011-03-07 ENCOUNTER — Other Ambulatory Visit: Payer: Self-pay | Admitting: Oncology

## 2011-03-07 ENCOUNTER — Telehealth: Payer: Self-pay | Admitting: Oncology

## 2011-03-07 DIAGNOSIS — R1900 Intra-abdominal and pelvic swelling, mass and lump, unspecified site: Secondary | ICD-10-CM

## 2011-03-07 DIAGNOSIS — Z85038 Personal history of other malignant neoplasm of large intestine: Secondary | ICD-10-CM

## 2011-03-07 NOTE — Telephone Encounter (Signed)
I called and talked with Ms. Wesche re: her PET scan last week.  There was slight uptake in the calcified mass near the anastomotic site.  However, not much higher FDG than the surrounding tissue.  She had already had a colonoscopy with Dr. Ewing Schlein in 09/2010. There was evidence of prior side-to-side anastomosis in the ascending colon.  There was mild stenosis.  This was not traversed.    I discussed the case with Dr. Ewing Schlein.  The aggressive option is to repeat colonoscopy with potential balloon dilation to traverse the stenosis to ensure that distal to the stricture, there is no abnormality.  The conservative option is to repeat the CT in 5 months.  Dr. Ewing Schlein has graciously agreed to talk to patient over these options.     I recommended a repeat CT abdomen in about 4 months to document stability regardless of repeat colonoscopy or not.    I thus will defer her visit from 03/09/2011 to about  4 months from now.  She expressed agreement with the stated plan

## 2011-03-09 ENCOUNTER — Ambulatory Visit: Payer: Medicare Other | Admitting: Oncology

## 2011-03-10 ENCOUNTER — Encounter (INDEPENDENT_AMBULATORY_CARE_PROVIDER_SITE_OTHER): Payer: Self-pay | Admitting: Surgery

## 2011-03-10 ENCOUNTER — Ambulatory Visit (INDEPENDENT_AMBULATORY_CARE_PROVIDER_SITE_OTHER): Payer: Medicare Other | Admitting: Surgery

## 2011-03-10 VITALS — BP 132/72 | HR 70 | Temp 97.9°F | Resp 18 | Ht 66.0 in | Wt 141.0 lb

## 2011-03-10 DIAGNOSIS — Z85038 Personal history of other malignant neoplasm of large intestine: Secondary | ICD-10-CM

## 2011-03-10 NOTE — Patient Instructions (Signed)
Followup with Dr. Vida Rigger Will be glad to see you at [facility] if we can help

## 2011-03-10 NOTE — Progress Notes (Signed)
[name] comes in today referred by Dr. Darrelyn Hillock.  [name] had a laparoscopic assisted right hemicolectomy in September of 2003. 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 She has been followed by Dr. Vida Rigger and had a colonoscopy in September. Dr. Gaylyn Rong ordered a PET scan which didn't show any definitive signs of a recurrence around her ilio colostomy anastomosis. This has not been bothering her compared to her recent right knee replacement in the injections of material to treat her back fractures.  She is scheduled to see Dr. Ewing Schlein and I will defer to him regarding the need for repeat colonoscopy. I don't think she has recurrent disease in this area of calcification around her old anastomosis. I will be glad to see her again if needed but hopefully the 2 problems causing her the most pain will get better and she will have a better quality of life.  Return p.r.n.

## 2011-03-11 ENCOUNTER — Telehealth: Payer: Self-pay | Admitting: Oncology

## 2011-03-11 NOTE — Telephone Encounter (Signed)
called pts home and provided appt for June2013 including ct scan for 06/12 @ [facility]. pt will come by to pick up contrast for ct scan

## 2011-04-11 ENCOUNTER — Telehealth: Payer: Self-pay | Admitting: *Deleted

## 2011-04-11 NOTE — Telephone Encounter (Signed)
Barb from Dr. Marlane Hatcher office called to inform that Dr. Ewing Schlein saw pt in office and she declined colonoscopy at this time but has appt in April to "re discuss" w/ Dr. Ewing Schlein at that time.  Wanted Dr. Gaylyn Rong to be aware.

## 2011-04-21 ENCOUNTER — Other Ambulatory Visit: Payer: Self-pay | Admitting: Oncology

## 2011-04-21 DIAGNOSIS — N63 Unspecified lump in unspecified breast: Secondary | ICD-10-CM

## 2011-05-03 NOTE — Progress Notes (Signed)
Received office notes from Dr. Vida Rigger @ [facility]; forwarded to Dr. Gaylyn Rong.

## 2011-05-31 ENCOUNTER — Ambulatory Visit
Admission: RE | Admit: 2011-05-31 | Discharge: 2011-05-31 | Disposition: A | Discharge status: Home / Self Care | Payer: Medicare Other | Source: Ambulatory Visit | Attending: Oncology | Admitting: Oncology

## 2011-05-31 DIAGNOSIS — N63 Unspecified lump in unspecified breast: Secondary | ICD-10-CM

## 2011-06-29 ENCOUNTER — Ambulatory Visit (HOSPITAL_COMMUNITY)
Admission: RE | Admit: 2011-06-29 | Discharge: 2011-06-29 | Disposition: A | Discharge status: Home / Self Care | Payer: Medicare Other | Source: Ambulatory Visit | Attending: Oncology | Admitting: Oncology

## 2011-06-29 ENCOUNTER — Other Ambulatory Visit (HOSPITAL_BASED_OUTPATIENT_CLINIC_OR_DEPARTMENT_OTHER): Payer: Medicare Other | Admitting: Lab

## 2011-06-29 ENCOUNTER — Ambulatory Visit (HOSPITAL_COMMUNITY)
Admission: RE | Admit: 2011-06-29 | Discharge: 2011-06-29 | Payer: Medicare Other | Source: Ambulatory Visit | Attending: Rheumatology | Admitting: Rheumatology

## 2011-06-29 DIAGNOSIS — Z9049 Acquired absence of other specified parts of digestive tract: Secondary | ICD-10-CM | POA: Insufficient documentation

## 2011-06-29 DIAGNOSIS — K5730 Diverticulosis of large intestine without perforation or abscess without bleeding: Secondary | ICD-10-CM | POA: Insufficient documentation

## 2011-06-29 DIAGNOSIS — R1900 Intra-abdominal and pelvic swelling, mass and lump, unspecified site: Secondary | ICD-10-CM

## 2011-06-29 DIAGNOSIS — Q438 Other specified congenital malformations of intestine: Secondary | ICD-10-CM | POA: Insufficient documentation

## 2011-06-29 DIAGNOSIS — Z85038 Personal history of other malignant neoplasm of large intestine: Secondary | ICD-10-CM

## 2011-06-29 DIAGNOSIS — K8020 Calculus of gallbladder without cholecystitis without obstruction: Secondary | ICD-10-CM | POA: Insufficient documentation

## 2011-06-29 DIAGNOSIS — Z9089 Acquired absence of other organs: Secondary | ICD-10-CM | POA: Insufficient documentation

## 2011-06-29 DIAGNOSIS — C189 Malignant neoplasm of colon, unspecified: Secondary | ICD-10-CM | POA: Insufficient documentation

## 2011-06-29 DIAGNOSIS — Z980 Intestinal bypass and anastomosis status: Secondary | ICD-10-CM | POA: Insufficient documentation

## 2011-06-29 LAB — CMP (CANCER CENTER ONLY)
AST: 29 U/L (ref 11–38)
Albumin: 3.9 g/dL (ref 3.3–5.5)
Alkaline Phosphatase: 62 U/L (ref 26–84)
BUN, Bld: 18 mg/dL (ref 7–22)
Calcium: 9.1 mg/dL (ref 8.0–10.3)
Creat: 1.1 mg/dl (ref 0.6–1.2)
Glucose, Bld: 136 mg/dL — ABNORMAL HIGH (ref 73–118)
Potassium: 5.1 mEq/L — ABNORMAL HIGH (ref 3.3–4.7)

## 2011-06-29 LAB — CBC WITH DIFFERENTIAL/PLATELET
Basophils Absolute: 0 10*3/uL (ref 0.0–0.1)
EOS%: 4.3 % (ref 0.0–7.0)
HGB: 13.9 g/dL (ref 11.6–15.9)
MCH: 34.4 pg — ABNORMAL HIGH (ref 25.1–34.0)
MCV: 103.1 fL — ABNORMAL HIGH (ref 79.5–101.0)
MONO%: 12.2 % (ref 0.0–14.0)
NEUT%: 58.8 % (ref 38.4–76.8)
RDW: 13.3 % (ref 11.2–14.5)

## 2011-06-29 MED ORDER — IOHEXOL 300 MG/ML  SOLN
100.0000 mL | Freq: Once | INTRAMUSCULAR | Status: AC | PRN
  Administered 2011-06-29: 100 mL via INTRAVENOUS

## 2011-06-30 ENCOUNTER — Ambulatory Visit (HOSPITAL_BASED_OUTPATIENT_CLINIC_OR_DEPARTMENT_OTHER): Payer: Medicare Other | Admitting: Oncology

## 2011-06-30 ENCOUNTER — Ambulatory Visit: Payer: Medicare Other | Admitting: Oncology

## 2011-06-30 ENCOUNTER — Telehealth: Payer: Self-pay | Admitting: Oncology

## 2011-06-30 VITALS — BP 133/87 | HR 88 | Temp 97.8°F | Ht 66.0 in | Wt 144.8 lb

## 2011-06-30 DIAGNOSIS — M810 Age-related osteoporosis without current pathological fracture: Secondary | ICD-10-CM

## 2011-06-30 DIAGNOSIS — Z85038 Personal history of other malignant neoplasm of large intestine: Secondary | ICD-10-CM

## 2011-06-30 DIAGNOSIS — C189 Malignant neoplasm of colon, unspecified: Secondary | ICD-10-CM

## 2011-06-30 NOTE — Telephone Encounter (Signed)
Gv pt appt for [id number] 

## 2011-06-30 NOTE — Progress Notes (Signed)
[facility] OFFICE PROGRESS NOTE  Cc:  Sara Reichert, MD  DIAGNOSIS:  History of stage IIIB pT3 N1 M0 adenocarcinoma of the right colon.  PAST THERAPY:  She had resection in September 2003.  She received adjuvant chemotherapy with 5-FU, irinotecan, leucovorin which finished in April 2004 by Dr. Arlan Organ.  Last surveillance colonoscopy with Dr. Ewing Schlein was reportedly negative in September 2012.  I'm requesting these record for confirmation.   CURRENT THERAPY:  Watchful observation.  INTERVAL HISTORY: [name] 78 y.o. female returns for regular follow up with her son.  The patient reports she is doing well. She has no fatigue. No chest pain, shortness of breath, or dyspnea. No nausea of vomiting. No abdominal pain or change in her bowel habits. She has not noticed any Blood in her stool or bleeding elsewhere. Appetite remains good and weight is up by 4 lbs since February 2013.  Patient denies headache, visual changes, confusion, drenching night sweats, palpable lymph node swelling, mucositis, odynophagia, dysphagia, nausea vomiting, jaundice, chest pain, palpitation, shortness of breath, dyspnea on exertion, productive cough, gum bleeding, epistaxis, hematemesis, hemoptysis, abdominal pain, abdominal swelling, early satiety, melena, hematochezia, hematuria, skin rash, spontaneous bleeding, joint swelling, joint pain, heat or cold intolerance, bowel bladder incontinence,paresthesia, depression, suicidal or homocidal ideation, feeling hopelessness.   MEDICAL HISTORY: Past Medical History  Diagnosis Date  . Hypertension   . Hypothyroid   . Goiter   . GERD (gastroesophageal reflux disease)   . Fibromyalgia   . PONV (postoperative nausea and vomiting)   . Heart murmur   . Peripheral vascular disease     varicose veins in left arm   . Shortness of breath     with exertion   . Anemia     hx of in 2003   . Hiatal hernia   . Headache     tension headache daily per  pt   . Arthritis   . Depression   . DJD (degenerative joint disease)   . Osteoarthritis   . Colon cancer     s/p resecton of colon in 2003 , hx of chemo    SURGICAL HISTORY:  Past Surgical History  Procedure Date  . Tonsillectomy   . Tmj arthroplasty   . Back surgery   . Appendectomy   . Colon resection   . Eye surgery     bilateral cataract surgery   . Total knee arthroplasty 12/20/2010    Procedure: TOTAL KNEE ARTHROPLASTY;  Surgeon: Shelda Pal;  Location: [facility];  Service: Orthopedics;  Laterality: Right;  . Joint replacement   . Colonoscopy w/ biopsies 09/25/2002, 09/18/2001  . Upper gastrointestinal endoscopy 09/18/2001  . Porta catheter placement 10/30/2001  . Porta catheter removal 06/27/2003    MEDICATIONS: Current Outpatient Prescriptions  Medication Sig Dispense Refill  . acetaminophen (TYLENOL) 500 MG tablet Take 500 mg by mouth daily. Pt takes at 7pm      . AMBULATORY NON FORMULARY MEDICATION Presser eye vitamin 1-2 tabs daily       . amLODipine (NORVASC) 5 MG tablet Take 5 mg by mouth every morning.       Marland Kitchen aspirin 325 MG tablet Take 1 tablet (325 mg total) by mouth 2 (two) times daily.      . Calcium-Vitamin D (CALTRATE 600 PLUS-VIT D PO) Take 1 tablet by mouth once a week.       . estradiol (ESTRACE) 0.5 MG tablet Take 0.25 mg by mouth Daily.      Marland Kitchen  levothyroxine (SYNTHROID, LEVOTHROID) 112 MCG tablet Take 100 mcg by mouth every morning.       . medroxyPROGESTERone (PROVERA) 5 MG tablet Take 2.5 mg by mouth daily.       . Multiple Vitamins-Minerals (MULTI COMPLETE PO) Take by mouth daily.       . niacin (NIASPAN) 1000 MG CR tablet Take 1,000 mg by mouth at bedtime.       . nortriptyline (PAMELOR) 25 MG capsule Take 25-50 mg by mouth 2 (two) times daily. 25 mg in the morning and 50 mg in the evening      . omeprazole (PRILOSEC) 20 MG capsule Take 20 mg by mouth daily.       . risedronate (ACTONEL) 150 MG tablet Take 150 mg by mouth every 30 (thirty) days. with  water on empty stomach, nothing by mouth or lie down for next 30 minutes.      . simvastatin (ZOCOR) 20 MG tablet Take 20 mg by mouth at bedtime.       . vitamin B-12 (CYANOCOBALAMIN) 500 MCG tablet Take 500 mcg by mouth daily.       . vitamin C (ASCORBIC ACID) 500 MG tablet Take 500 mg by mouth daily.       . vitamin E 400 UNIT capsule Take 400 Units by mouth 2 (two) times daily.       Marland Kitchen zolpidem (AMBIEN) 10 MG tablet Take 10 mg by mouth at bedtime.       No current facility-administered medications for this visit.   Facility-Administered Medications Ordered in Other Visits  Medication Dose Route Frequency Provider Last Rate Last Dose  . iohexol (OMNIPAQUE) 300 MG/ML solution 100 mL  100 mL Intravenous Once PRN Medication Radiologist, MD   100 mL at 06/29/11 1211    ALLERGIES:  is allergic to latex; lidocaine; and sulfur.  REVIEW OF SYSTEMS:  The rest of the 14-point review of system was negative.   Filed Vitals:   06/30/11 0921  BP: 133/87  Pulse: 88  Temp: 97.8 F (36.6 C)   Wt Readings from Last 3 Encounters:  06/30/11 144 lb 12.8 oz (65.681 kg)  03/10/11 141 lb (63.957 kg)  02/23/11 141 lb (63.957 kg)   ECOG Performance status: 1    PHYSICAL EXAMINATION:   General: thin-appearing woman in no acute distress.  Eyes:  no scleral icterus.  ENT:  There were no oropharyngeal lesions.  Neck was without thyromegaly.  Lymphatics:  Negative cervical, supraclavicular or axillary adenopathy.  Respiratory: lungs were clear bilaterally without wheezing or crackles.  Cardiovascular:  Regular rate and rhythm, S1/S2, without rub or gallop.  There was a III/VI systolic murmur in left upper sternal border without radiation. There was no pedal edema.  GI:  abdomen was soft, flat, nontender, nondistended, without organomegaly.  Muscoloskeletal:  no spinal tenderness of palpation of vertebral spine.  Skin exam was without echymosis, petichae.  Neuro exam was nonfocal.  Patient able to get on and off  exam table.  Gait steady.  Patient was alerted and oriented.  Attention was good.   Language was appropriate.  Mood was normal without depression.  Speech was not pressured.  Thought content was not tangential.    LABORATORY/RADIOLOGY DATA:  Lab Results  Component Value Date   WBC 5.8 06/29/2011   HGB 13.9 06/29/2011   HCT 41.7 06/29/2011   PLT 223 06/29/2011   GLUCOSE 136* 06/29/2011   ALT 13 02/23/2011   AST 29 06/29/2011   NA  146* 06/29/2011   K 5.1* 06/29/2011   CL 100 06/29/2011   CREATININE 1.1 06/29/2011   BUN 18 06/29/2011   CO2 32 06/29/2011   INR 1.00 12/15/2010   RADIOLOGY: *RADIOLOGY REPORT*  Clinical Data: Colon cancer status post resection in 2003. Prior  appendectomy.  CT ABDOMEN AND PELVIS WITH CONTRAST  Technique: Multidetector CT imaging of the abdomen and pelvis was  performed following the standard protocol during bolus  administration of intravenous contrast.  Contrast: OMNIPAQUE IOHEXOL 300 MG/ML SOLN  Comparison: PET CT from 03/04/2011. CT scan from 01/25/2011.  Findings: No focal abnormalities seen in the liver or spleen. The  stomach, duodenum, and adrenal glands are unremarkable. Numerous  layering tiny calcified stones are seen in the lumen of the  gallbladder. A focal area of the pancreatic tail is markedly  atrophic and the pancreatic duct appears distended in this region,  but the overall appearance is unchanged since 08/19/2004. Tiny  cortical cyst noted in the left kidney. Kidneys are otherwise  unremarkable.  No abdominal aortic aneurysm. There is no free fluid or  lymphadenopathy in the abdomen. No evidence for bowel obstruction.  Imaging through the pelvis shows no free intraperitoneal fluid. No  pelvic sidewall lymphadenopathy. Bladder is decompressed. Uterus  is unremarkable. There is no evidence for an adnexal mass.  The sigmoid colon is markedly redundant and characterized by  diverticular changes. No associated diverticulitis of the colon  at  this time. The patient is status post right hemicolectomy. The  coarse calcification associated with the anastomosis has been  described on multiple previous imaging exams and, again, is  probably related to the suture line. The soft tissue nodule in the  adjacent mesentery is stable since the 01/25/2011 CT scan. This  area was evaluated by PET imaging on 03/04/2011 and did not show  appreciable hypermetabolism at that time. For reference, the soft  tissue nodule measures 2.8 x 1.3 cm on axial images today compared  2.7 x 1.5 cm on axial images obtained on the 01/25/2011 study.  Sigmoid colon is redundant and stool filled. Moderate diverticular  disease seen along the length of the sigmoid colon without  diverticulitis.  Bone windows show interval vertebral augmentation at T11 since the  01/25/2011 exam.  IMPRESSION:  Stable appearance of the coarse calcification and mesenteric nodule  associated with the entero-colic anastomoses/suture line.  Original Report Authenticated By: ERIC A. MANSELL, M.D.  ASSESSMENT AND PLAN:   1.  History of stage IIIB colon cancer, status post resection and adjuvant chemotherapy.  I discussed with patient that per clinical history, lab, and CT scan, there is no convincing evidence of colon cancer recurrence or metastatic disease. CT scan showed a stable area of coarse calcification and mesenteric nodule associated with the entero-colic anastomoses.suture line. CEA remains normal. 2.  Hypertension:  Relatively well controlled on amlodipine per PCP. 3.  Hypothyroidism:  She is on levothyroxine per PCP. 4.  Hyperlipidemia:  She is on niacin and simvastatin per PCP. 5.  Osteoporosis:  She is on calcium and vitamin D and risedronate per PCP.   6.  Osteoarthritis:  She is on pain medication per PCP.  7.  Follow up:  In 1 year. She was instructed to call us is she develops any symptoms and we will be happy to see her sooner. Last colonoscopy was in Sept 2012 and  not due until 2015. She will follow-up with Dr Ewing Schlein.  The length of time of the face-to-face encounter  was 15 minutes. More than 50% of time was spent counseling and coordination of care.

## 2011-07-01 ENCOUNTER — Other Ambulatory Visit: Payer: Medicare Other

## 2011-07-01 ENCOUNTER — Other Ambulatory Visit (HOSPITAL_COMMUNITY): Payer: Medicare Other

## 2011-10-27 ENCOUNTER — Other Ambulatory Visit: Payer: Self-pay | Admitting: Obstetrics & Gynecology

## 2011-10-27 DIAGNOSIS — N63 Unspecified lump in unspecified breast: Secondary | ICD-10-CM

## 2011-11-16 ENCOUNTER — Other Ambulatory Visit (HOSPITAL_COMMUNITY): Payer: Self-pay | Admitting: Endocrinology

## 2011-11-16 DIAGNOSIS — E049 Nontoxic goiter, unspecified: Secondary | ICD-10-CM

## 2011-11-22 ENCOUNTER — Ambulatory Visit (HOSPITAL_COMMUNITY)
Admission: RE | Admit: 2011-11-22 | Discharge: 2011-11-22 | Disposition: A | Discharge status: Home / Self Care | Payer: Medicare Other | Source: Ambulatory Visit | Attending: Endocrinology | Admitting: Endocrinology

## 2011-11-22 DIAGNOSIS — E049 Nontoxic goiter, unspecified: Secondary | ICD-10-CM

## 2011-11-28 ENCOUNTER — Other Ambulatory Visit: Payer: Self-pay | Admitting: Endocrinology

## 2011-11-28 DIAGNOSIS — E042 Nontoxic multinodular goiter: Secondary | ICD-10-CM

## 2011-11-30 ENCOUNTER — Ambulatory Visit
Admission: RE | Admit: 2011-11-30 | Discharge: 2011-11-30 | Disposition: A | Discharge status: Home / Self Care | Payer: Medicare Other | Source: Ambulatory Visit | Attending: Obstetrics & Gynecology | Admitting: Obstetrics & Gynecology

## 2011-11-30 DIAGNOSIS — N63 Unspecified lump in unspecified breast: Secondary | ICD-10-CM

## 2011-12-07 ENCOUNTER — Ambulatory Visit
Admission: RE | Admit: 2011-12-07 | Discharge: 2011-12-07 | Disposition: A | Discharge status: Home / Self Care | Payer: Medicare Other | Source: Ambulatory Visit | Attending: Endocrinology | Admitting: Endocrinology

## 2011-12-07 ENCOUNTER — Other Ambulatory Visit (HOSPITAL_COMMUNITY)
Admission: RE | Admit: 2011-12-07 | Discharge: 2011-12-07 | Disposition: A | Discharge status: Home / Self Care | Payer: Medicare Other | Source: Ambulatory Visit | Attending: Interventional Radiology | Admitting: Interventional Radiology

## 2011-12-07 DIAGNOSIS — E049 Nontoxic goiter, unspecified: Secondary | ICD-10-CM | POA: Insufficient documentation

## 2011-12-07 DIAGNOSIS — E042 Nontoxic multinodular goiter: Secondary | ICD-10-CM

## 2012-03-22 ENCOUNTER — Other Ambulatory Visit: Payer: Self-pay | Admitting: Rheumatology

## 2012-03-22 DIAGNOSIS — E049 Nontoxic goiter, unspecified: Secondary | ICD-10-CM

## 2012-06-25 ENCOUNTER — Ambulatory Visit
Admission: RE | Admit: 2012-06-25 | Discharge: 2012-06-25 | Disposition: A | Discharge status: Home / Self Care | Payer: Medicare Other | Source: Ambulatory Visit | Attending: Rheumatology | Admitting: Rheumatology

## 2012-06-25 DIAGNOSIS — E049 Nontoxic goiter, unspecified: Secondary | ICD-10-CM

## 2012-07-01 NOTE — Patient Instructions (Signed)
1. History of stage IIIB colon cancer, status post resection and adjuvant chemotherapy. Continue to be in remission.   2. Follow up: In 1 year. Last colonoscopy was in Sept 2012.  Next one due in 2015.

## 2012-07-02 ENCOUNTER — Telehealth: Payer: Self-pay | Admitting: Oncology

## 2012-07-02 ENCOUNTER — Ambulatory Visit (HOSPITAL_BASED_OUTPATIENT_CLINIC_OR_DEPARTMENT_OTHER): Payer: Medicare Other | Admitting: Oncology

## 2012-07-02 ENCOUNTER — Other Ambulatory Visit (HOSPITAL_BASED_OUTPATIENT_CLINIC_OR_DEPARTMENT_OTHER): Payer: Medicare Other | Admitting: Lab

## 2012-07-02 VITALS — BP 146/81 | HR 89 | Temp 97.8°F | Resp 18 | Ht 66.0 in | Wt 147.4 lb

## 2012-07-02 DIAGNOSIS — C189 Malignant neoplasm of colon, unspecified: Secondary | ICD-10-CM

## 2012-07-02 LAB — CBC WITH DIFFERENTIAL/PLATELET
BASO%: 0.3 % (ref 0.0–2.0)
MCHC: 34.4 g/dL (ref 31.5–36.0)
MONO#: 1.2 10*3/uL — ABNORMAL HIGH (ref 0.1–0.9)
RBC: 3.78 10*6/uL (ref 3.70–5.45)
WBC: 9.8 10*3/uL (ref 3.9–10.3)
lymph#: 1.3 10*3/uL (ref 0.9–3.3)

## 2012-07-02 LAB — COMPREHENSIVE METABOLIC PANEL (CC13)
ALT: 18 U/L (ref 0–55)
AST: 19 U/L (ref 5–34)
Albumin: 3.8 g/dL (ref 3.5–5.0)
Alkaline Phosphatase: 88 U/L (ref 40–150)
BUN: 17.2 mg/dL (ref 7.0–26.0)
CO2: 30 mEq/L — ABNORMAL HIGH (ref 22–29)
Calcium: 10.3 mg/dL (ref 8.4–10.4)
Chloride: 101 mEq/L (ref 98–107)
Creatinine: 1.1 mg/dL (ref 0.6–1.1)
Glucose: 137 mg/dl — ABNORMAL HIGH (ref 70–99)
Potassium: 4.2 mEq/L (ref 3.5–5.1)
Sodium: 139 mEq/L (ref 136–145)
Total Bilirubin: 0.43 mg/dL (ref 0.20–1.20)
Total Protein: 7.5 g/dL (ref 6.4–8.3)

## 2012-07-02 LAB — CEA: CEA: 2.7 ng/mL (ref 0.0–5.0)

## 2012-07-02 NOTE — Telephone Encounter (Signed)
Gave pt appt for lab and MD for Next year 2014

## 2012-07-02 NOTE — Telephone Encounter (Signed)
Gave pt appt for next year 2015 lab and ML

## 2012-07-02 NOTE — Progress Notes (Signed)
[facility] OFFICE PROGRESS NOTE  Cc:  Sara Reichert, MD  DIAGNOSIS:  History of stage IIIB pT3 N1 M0 adenocarcinoma of the right colon.  PAST THERAPY:  She had resection in September 2003.  She received adjuvant chemotherapy with 5-FU, irinotecan, leucovorin which finished in April 2004 by Dr. Arlan Organ.  Last surveillance colonoscopy with Dr. Ewing Schlein was reportedly negative in September 2012.   CURRENT THERAPY:  Watchful observation.  INTERVAL HISTORY: [name] 78 y.o. female returns for regular follow up. She is here with her son.  She has chronic lower back and knee pain from osteoarthritis.  She is still able to ambulate without assistance.  She has thyroid nodules that are being followed by Dr. Talmage Nap.  She denied any enlargement of her neck.  She denied abdominal pain, jaundice, GI bleed.  The rest of the 14-point review of system was negative.   MEDICAL HISTORY: Past Medical History  Diagnosis Date  . Hypertension   . Hypothyroid   . Goiter   . GERD (gastroesophageal reflux disease)   . Fibromyalgia   . PONV (postoperative nausea and vomiting)   . Heart murmur   . Peripheral vascular disease     varicose veins in left arm   . Shortness of breath     with exertion   . Anemia     hx of in 2003   . Hiatal hernia   . Headache     tension headache daily per pt   . Arthritis   . Depression   . DJD (degenerative joint disease)   . Osteoarthritis   . Colon cancer     s/p resecton of colon in 2003 , hx of chemo    SURGICAL HISTORY:  Past Surgical History  Procedure Laterality Date  . Tonsillectomy    . Tmj arthroplasty    . Back surgery    . Appendectomy    . Colon resection    . Eye surgery      bilateral cataract surgery   . Total knee arthroplasty  12/20/2010    Procedure: TOTAL KNEE ARTHROPLASTY;  Surgeon: Shelda Pal;  Location: [facility];  Service: Orthopedics;  Laterality: Right;  . Joint replacement    . Colonoscopy w/ biopsies   09/25/2002, 09/18/2001  . Upper gastrointestinal endoscopy  09/18/2001  . Porta catheter placement  10/30/2001  . Porta catheter removal  06/27/2003    MEDICATIONS: Current Outpatient Prescriptions  Medication Sig Dispense Refill  . acetaminophen (TYLENOL) 500 MG tablet Take 500 mg by mouth daily. Pt takes at 7pm      . AMBULATORY NON FORMULARY MEDICATION Presser eye vitamin 1-2 tabs daily       . amLODipine (NORVASC) 5 MG tablet Take 5 mg by mouth every morning.       Marland Kitchen aspirin 325 MG tablet Take 1 tablet (325 mg total) by mouth 2 (two) times daily.      . Calcium-Vitamin D (CALTRATE 600 PLUS-VIT D PO) Take 1 tablet by mouth once a week.       . docusate sodium (COLACE) 100 MG capsule Take 100 mg by mouth 3 (three) times daily as needed for constipation.      Marland Kitchen estradiol (ESTRACE) 0.5 MG tablet Take 0.25 mg by mouth Daily.      Marland Kitchen levothyroxine (SYNTHROID) 125 MCG tablet Take 125 mcg by mouth daily before breakfast. Monday thru Thursday      . levothyroxine (SYNTHROID) 137 MCG tablet Take 137  mcg by mouth daily before breakfast. Friday thru Sunday      . medroxyPROGESTERone (PROVERA) 5 MG tablet Take 2.5 mg by mouth daily.       . Multiple Vitamins-Minerals (MULTI COMPLETE PO) Take by mouth daily.       . niacin (NIASPAN) 1000 MG CR tablet Take 1,000 mg by mouth at bedtime.       . nortriptyline (PAMELOR) 25 MG capsule Take 25-50 mg by mouth 2 (two) times daily. 25 mg in the morning and 50 mg in the evening      . omeprazole (PRILOSEC) 20 MG capsule Take 20 mg by mouth daily.       . risedronate (ACTONEL) 150 MG tablet Take 150 mg by mouth every 30 (thirty) days. with water on empty stomach, nothing by mouth or lie down for next 30 minutes.      . simvastatin (ZOCOR) 20 MG tablet Take 20 mg by mouth at bedtime.       . Teriparatide, Recombinant, (FORTEO [place]) Inject 25 mcg into the skin daily.      . vitamin C (ASCORBIC ACID) 500 MG tablet Take 500 mg by mouth daily.       . vitamin E 400 UNIT  capsule Take 400 Units by mouth 2 (two) times daily.       Marland Kitchen zolpidem (AMBIEN) 10 MG tablet Take 10 mg by mouth at bedtime.       No current facility-administered medications for this visit.    ALLERGIES:  is allergic to latex; lidocaine; and sulfur.  REVIEW OF SYSTEMS:  The rest of the 14-point review of system was negative.   Filed Vitals:   07/02/12 0945  BP: 146/81  Pulse: 89  Temp: 97.8 F (36.6 C)  Resp: 18   Wt Readings from Last 3 Encounters:  07/02/12 147 lb 6.4 oz (66.86 kg)  06/30/11 144 lb 12.8 oz (65.681 kg)  03/10/11 141 lb (63.957 kg)   ECOG Performance status: 1-2 due to back and knee pain.   PHYSICAL EXAMINATION:   General: thin-appearing woman in no acute distress.  Eyes:  no scleral icterus.  ENT:  There were no oropharyngeal lesions.  Neck was without thyromegaly.  Lymphatics:  Negative cervical, supraclavicular or axillary adenopathy.  Respiratory: lungs were clear bilaterally without wheezing or crackles.  Cardiovascular:  Regular rate and rhythm, S1/S2, without rub or gallop.  There was a III/VI systolic murmur in left upper sternal border without radiation. There was no pedal edema.  GI:  abdomen was soft, flat, nontender, nondistended, without organomegaly.  Muscoloskeletal:  no spinal tenderness of palpation of vertebral spine.  Skin exam was without echymosis, petichae.  Neuro exam was nonfocal.  Patient needed help to get on and off exam table.  Gait was slow.  She did not need a cane today.  Patient was alert and oriented.  Attention was good.   Language was appropriate.  Mood was normal without depression.  Speech was not pressured.  Thought content was not tangential.    LABORATORY/RADIOLOGY DATA:  Lab Results  Component Value Date   WBC 9.8 07/02/2012   HGB 13.4 07/02/2012   HCT 39.0 07/02/2012   PLT 227 07/02/2012   GLUCOSE 137* 07/02/2012   ALT 18 07/02/2012   AST 19 07/02/2012   NA 139 07/02/2012   K 4.2 07/02/2012   CL 101 07/02/2012   CREATININE  1.1 07/02/2012   BUN 17.2 07/02/2012   CO2 30* 07/02/2012   INR  1.00 12/15/2010    ASSESSMENT AND PLAN:   1.  History of stage IIIB colon cancer, status post resection and adjuvant chemotherapy.  She is still in remission.  Past CT scan in 2013 showed possible anastomotic thickening which were both negative on PET scan and colonoscopy.  As she is more than 10 years out from the diagnosis of colon cancer and is not symptomatic, there is no indication for routine CT scan.  I advised her to keep appointment with Dr. Augustine Radar for her routine surveillance colonoscopy.  2.  Thyroid nodule:  Past FNA were negative.  She is being followed by Dr. Talmage Nap.   3.  Follow up:  In about 1 year.  She prefers to follow with oncology even though she is more than 10 years out.   I informed [name] that I am leaving the practice.  [facility] for [facility] to follow up with another provider when she returns.    The length of time of the face-to-face encounter was 10 minutes. More than 50% of time was spent counseling and coordination of care.

## 2012-07-13 ENCOUNTER — Encounter: Payer: Self-pay | Admitting: *Deleted

## 2012-07-26 ENCOUNTER — Ambulatory Visit (INDEPENDENT_AMBULATORY_CARE_PROVIDER_SITE_OTHER): Payer: Medicare Other | Admitting: Nurse Practitioner

## 2012-07-26 ENCOUNTER — Encounter: Payer: Self-pay | Admitting: Nurse Practitioner

## 2012-07-26 VITALS — BP 138/60 | HR 72 | Resp 12 | Ht 66.0 in | Wt 144.4 lb

## 2012-07-26 DIAGNOSIS — Z01419 Encounter for gynecological examination (general) (routine) without abnormal findings: Secondary | ICD-10-CM

## 2012-07-26 MED ORDER — MEDROXYPROGESTERONE ACETATE 5 MG PO TABS: 2.5000 mg | ORAL_TABLET | Freq: Every day | ORAL | Status: DC

## 2012-07-26 MED ORDER — ESTRADIOL 0.5 MG PO TABS: 0.2500 mg | ORAL_TABLET | Freq: Every day | ORAL | Status: DC

## 2012-07-26 NOTE — Patient Instructions (Signed)

## 2012-07-26 NOTE — Progress Notes (Signed)
79 y.o. G1P1 Divorced Caucasian Fe here for annual exam. She is still having problems with regulation of synthroid.  We had sent her to Dr. Talmage Nap and is now on a split dose of med's - to have recheck next week but patient states she feels well.  She is now on Forteo for Osteoporosis and doing OK she is being followed by PCP for that.  Son is still trying to get disability.  Her  'friend' still in Nursing Home and goes to see him every 2 wk's.. Not Sexually active.  No LMP recorded. Patient is postmenopausal.          Sexually active: no  The current method of family planning is post menopausal status.    Exercising: yes  walks daily  Smoker:  no  Health Maintenance: Pap:  07/13/2009  negative MMG:  11/30/2011 normal Colonoscopy:  09/2005 normal due in 2015 BMD:   09/2011  T Score:  Spine -0.5; left femur neck -2.8; total -2.3 on Forteo will get a repeat BMD in  September per pt. TDaP:  PCP maintains tetanus  Labs: PCP maintains urine and labs. Including Vit D.   reports that she has never smoked. She has never used smokeless tobacco. She reports that she does not drink alcohol or use illicit drugs.  Past Medical History  Diagnosis Date  . Hypertension   . Hypothyroid   . Goiter   . GERD (gastroesophageal reflux disease)   . Fibromyalgia   . PONV (postoperative nausea and vomiting)   . Heart murmur   . Peripheral vascular disease     varicose veins in left arm   . Shortness of breath     with exertion   . Anemia     hx of in 2003   . Hiatal hernia   . Headache(784.0)     tension headache daily per pt   . Arthritis   . Depression   . DJD (degenerative joint disease)   . Osteoarthritis   . Colon cancer     s/p resecton of colon in 2003 , hx of chemo  . Headaches, cluster   . Abnormal uterine bleeding (AUB)     on cont HRT  . Colon cancer   . TMJ (dislocation of temporomandibular joint)   . Compression fracture     due to fall    Past Surgical History  Procedure  Laterality Date  . Tonsillectomy    . Tmj arthroplasty    . Back surgery    . Appendectomy    . Colon resection    . Eye surgery      bilateral cataract surgery   . Total knee arthroplasty  12/20/2010    Procedure: TOTAL KNEE ARTHROPLASTY;  Surgeon: Shelda Pal;  Location: [facility];  Service: Orthopedics;  Laterality: Right;  . Joint replacement    . Colonoscopy w/ biopsies  09/25/2002, 09/18/2001  . Upper gastrointestinal endoscopy  09/18/2001  . Porta catheter placement  10/30/2001  . Porta catheter removal  06/27/2003  . Colon surgery    . Tmj arthroplasty    . Dilation and curettage of uterus    . Hysteroscopy    . Esophagogastroduodenoscopy endoscopy    . Vertebroplasty      Current Outpatient Prescriptions  Medication Sig Dispense Refill  . acetaminophen (TYLENOL) 500 MG tablet Take 500 mg by mouth daily. Pt takes at 7pm      . AMBULATORY NON FORMULARY MEDICATION Presser eye vitamin 1-2 tabs daily       .  amLODipine (NORVASC) 5 MG tablet Take 5 mg by mouth every morning.       Marland Kitchen aspirin 325 MG tablet Take 1 tablet (325 mg total) by mouth 2 (two) times daily.      . Calcium Carbonate (CALTRATE 600 PO) Take by mouth daily.      . Calcium-Vitamin D (CALTRATE 600 PLUS-VIT D PO) Take 1 tablet by mouth once a week.       . docusate sodium (COLACE) 100 MG capsule Take 100 mg by mouth 3 (three) times daily as needed for constipation.      Marland Kitchen estradiol (ESTRACE) 0.5 MG tablet Take 0.25 mg by mouth Daily.      Marland Kitchen levothyroxine (SYNTHROID) 125 MCG tablet Take 125 mcg by mouth daily before breakfast. Monday thru Thursday      . levothyroxine (SYNTHROID) 137 MCG tablet Take 137 mcg by mouth daily before breakfast. Friday thru Sunday      . medroxyPROGESTERone (PROVERA) 5 MG tablet Take 2.5 mg by mouth daily.       . Multiple Vitamins-Minerals (MULTI COMPLETE PO) Take by mouth daily.       . niacin (NIASPAN) 1000 MG CR tablet Take 1,000 mg by mouth at bedtime.       . nortriptyline (PAMELOR) 25  MG capsule Take 25-50 mg by mouth 2 (two) times daily. 25 mg in the morning and 50 mg in the evening      . omeprazole (PRILOSEC) 20 MG capsule Take 20 mg by mouth daily.       . risedronate (ACTONEL) 150 MG tablet Take 150 mg by mouth every 30 (thirty) days. with water on empty stomach, nothing by mouth or lie down for next 30 minutes.      . simvastatin (ZOCOR) 20 MG tablet Take 20 mg by mouth at bedtime.       . Teriparatide, Recombinant, (FORTEO [place]) Inject 25 mcg into the skin daily.      . vitamin B-12 (CYANOCOBALAMIN) 100 MCG tablet Take 50 mcg by mouth daily.      . vitamin C (ASCORBIC ACID) 500 MG tablet Take 500 mg by mouth daily.       . vitamin E 400 UNIT capsule Take 400 Units by mouth 2 (two) times daily.       Marland Kitchen zolpidem (AMBIEN) 10 MG tablet Take 10 mg by mouth at bedtime.       No current facility-administered medications for this visit.    Family History  Problem Relation Age of Onset  . Heart disease Mother     Enlarged heart  . Arrhythmia Mother   . Diabetes Mother   . Stroke Sister   . Heart disease Brother     MI at unknown age  . Anesthesia problems Brother   . Heart attack      MI at age 11  . Heart disease Brother     CABG    ROS:  Pertinent items are noted in HPI.  Otherwise, a comprehensive ROS was negative.  Exam:   BP 138/60  Pulse 72  Resp 12  Ht 5\' 6"  (1.676 m)  Wt 144 lb 6.4 oz (65.499 kg)  BMI 23.32 kg/m2 Height: 5\' 6"  (167.6 cm)  Ht Readings from Last 3 Encounters:  07/26/12 5\' 6"  (1.676 m)  07/02/12 5\' 6"  (1.676 m)  06/30/11 5\' 6"  (1.676 m)    General appearance: alert, cooperative and appears stated age Head: Normocephalic, without obvious abnormality, atraumatic Neck: no adenopathy,  supple, symmetrical, trachea midline and thyroid normal to inspection and palpation Lungs: clear to auscultation bilaterally Breasts: normal appearance, no masses or tenderness Heart: regular rate and rhythm Abdomen: soft, non-tender; no masses,  no  organomegaly Extremities: extremities normal, atraumatic, no cyanosis or edema Skin: Skin color, texture, turgor normal. No rashes or lesions Lymph nodes: Cervical, supraclavicular, and axillary nodes normal. No abnormal inguinal nodes palpated Neurologic: Grossly normal   Pelvic: External genitalia:  no lesions              Urethra:  normal appearing urethra with no masses, tenderness or lesions              Bartholin's and Skene's: normal                 Vagina: very atrophic appearing vagina with pale color and discharge, no lesions              Cervix: anteverted              Pap taken: no Bimanual Exam:  Uterus:  normal size, contour, position, consistency, mobility, non-tender              Adnexa: no mass, fullness, tenderness               Rectovaginal: Confirms               Anus:  normal sphincter tone, no lesions  A:  Well Woman with normal exam  postmenopausal on HRT and wants to continue  Atrophic vaginitis  OA with right knee replacement 12/12  Hypothyroid on replacement Dr. Talmage Nap now following  Osteoporosis - PCP now following  Vit D deficiency - PCP now following   P:   Pap smear as per guidelines   Mammogram due 11/14  Refill on low dose HRT for another year  Discussed with pt. potential SE including DVT, CVA, cancer, etc  counseled on breast self exam, use and side effects of HRT, osteoporosis,  adequate intake of calcium and vitamin D, diet and exercise, Kegel's exercises return annually or prn  An After Visit Summary was printed and given to the patient.

## 2012-08-10 NOTE — Progress Notes (Signed)
Encounter reviewed by Dr. Broderick Fonseca Silva.  

## 2012-08-30 ENCOUNTER — Telehealth: Payer: Self-pay | Admitting: Nurse Practitioner

## 2012-08-30 NOTE — Telephone Encounter (Signed)
Patient is calling about a vitamin D prescription. She called the pharmacy to refill her prescription and they said they faxed [place] a request on Monday. Patient did not have blood work when she came in her for annual. Please call patient

## 2012-08-30 NOTE — Telephone Encounter (Signed)
OK to refill for a year. Her PCP did Vit D I believe.

## 2012-08-30 NOTE — Telephone Encounter (Signed)
Refill request for VITAMIN D 50K Last filled by MD on - 07/25/11 X 1 YEAR Last AEX - 07/26/12 Last Vitamin D - 07/25/11 = 49 Next AEX - 07/29/13 Please advise refills.  Chart on your desk.  I spoke with the patient.  She is due to take her Vitamin D on Saturday.

## 2012-08-31 MED ORDER — VITAMIN D (ERGOCALCIFEROL) 1.25 MG (50000 UNIT) PO CAPS: 50000.0000 [IU] | ORAL_CAPSULE | ORAL | Status: DC

## 2012-08-31 NOTE — Telephone Encounter (Signed)
RX sent per note below.

## 2012-10-30 ENCOUNTER — Other Ambulatory Visit: Payer: Self-pay | Admitting: Nurse Practitioner

## 2012-10-30 ENCOUNTER — Other Ambulatory Visit: Payer: Self-pay | Admitting: Obstetrics & Gynecology

## 2012-10-30 ENCOUNTER — Other Ambulatory Visit: Payer: Self-pay | Admitting: Gastroenterology

## 2012-10-30 DIAGNOSIS — N6310 Unspecified lump in the right breast, unspecified quadrant: Secondary | ICD-10-CM

## 2012-10-30 DIAGNOSIS — R109 Unspecified abdominal pain: Secondary | ICD-10-CM

## 2012-11-02 ENCOUNTER — Ambulatory Visit
Admission: RE | Admit: 2012-11-02 | Discharge: 2012-11-02 | Disposition: A | Discharge status: Home / Self Care | Payer: Medicare Other | Source: Ambulatory Visit | Attending: Gastroenterology | Admitting: Gastroenterology

## 2012-11-02 DIAGNOSIS — R109 Unspecified abdominal pain: Secondary | ICD-10-CM

## 2012-11-06 ENCOUNTER — Encounter (HOSPITAL_COMMUNITY): Payer: Self-pay | Admitting: *Deleted

## 2012-11-07 ENCOUNTER — Encounter (HOSPITAL_COMMUNITY): Payer: Self-pay | Admitting: Pharmacy Technician

## 2012-11-08 ENCOUNTER — Other Ambulatory Visit: Payer: Self-pay | Admitting: Gastroenterology

## 2012-11-08 DIAGNOSIS — R933 Abnormal findings on diagnostic imaging of other parts of digestive tract: Secondary | ICD-10-CM | POA: Insufficient documentation

## 2012-11-13 ENCOUNTER — Encounter (HOSPITAL_COMMUNITY): Payer: Self-pay | Admitting: Anesthesiology

## 2012-11-13 MED ORDER — DEXTROSE 5 % IV SOLN
2.0000 g | Freq: Once | INTRAVENOUS | Status: AC
  Administered 2012-11-14: 2 g via INTRAVENOUS
  Filled 2012-11-13 (×2): qty 2

## 2012-11-14 ENCOUNTER — Encounter (HOSPITAL_COMMUNITY): Payer: Self-pay

## 2012-11-14 ENCOUNTER — Ambulatory Visit (HOSPITAL_COMMUNITY): Payer: Medicare Other

## 2012-11-14 ENCOUNTER — Encounter (HOSPITAL_COMMUNITY)
Admission: RE | Disposition: A | Discharge status: Home / Self Care | Payer: Self-pay | Source: Ambulatory Visit | Attending: Gastroenterology

## 2012-11-14 ENCOUNTER — Ambulatory Visit (HOSPITAL_COMMUNITY)
Admission: RE | Admit: 2012-11-14 | Discharge: 2012-11-14 | Disposition: A | Discharge status: Home / Self Care | Payer: Medicare Other | Source: Ambulatory Visit | Attending: Gastroenterology | Admitting: Gastroenterology

## 2012-11-14 ENCOUNTER — Ambulatory Visit (HOSPITAL_COMMUNITY): Payer: Medicare Other | Admitting: Anesthesiology

## 2012-11-14 ENCOUNTER — Encounter (HOSPITAL_COMMUNITY): Payer: Medicare Other | Admitting: Anesthesiology

## 2012-11-14 DIAGNOSIS — K8020 Calculus of gallbladder without cholecystitis without obstruction: Secondary | ICD-10-CM | POA: Insufficient documentation

## 2012-11-14 DIAGNOSIS — I10 Essential (primary) hypertension: Secondary | ICD-10-CM | POA: Insufficient documentation

## 2012-11-14 DIAGNOSIS — IMO0001 Reserved for inherently not codable concepts without codable children: Secondary | ICD-10-CM | POA: Insufficient documentation

## 2012-11-14 DIAGNOSIS — R109 Unspecified abdominal pain: Secondary | ICD-10-CM | POA: Insufficient documentation

## 2012-11-14 DIAGNOSIS — K219 Gastro-esophageal reflux disease without esophagitis: Secondary | ICD-10-CM | POA: Insufficient documentation

## 2012-11-14 DIAGNOSIS — I739 Peripheral vascular disease, unspecified: Secondary | ICD-10-CM | POA: Insufficient documentation

## 2012-11-14 DIAGNOSIS — R110 Nausea: Secondary | ICD-10-CM | POA: Insufficient documentation

## 2012-11-14 DIAGNOSIS — E039 Hypothyroidism, unspecified: Secondary | ICD-10-CM | POA: Insufficient documentation

## 2012-11-14 HISTORY — PX: ERCP: SHX5425

## 2012-11-14 LAB — TYPE AND SCREEN: Antibody Screen: NEGATIVE

## 2012-11-14 SURGERY — ERCP, WITH INTERVENTION IF INDICATED
Anesthesia: General

## 2012-11-14 MED ORDER — ONDANSETRON HCL 4 MG/2ML IJ SOLN
INTRAMUSCULAR | Status: DC | PRN
  Administered 2012-11-14: 4 mg via INTRAVENOUS

## 2012-11-14 MED ORDER — FENTANYL CITRATE 0.05 MG/ML IJ SOLN
INTRAMUSCULAR | Status: DC | PRN
  Administered 2012-11-14: 50 ug via INTRAVENOUS

## 2012-11-14 MED ORDER — LACTATED RINGERS IV SOLN: INTRAVENOUS | Status: DC

## 2012-11-14 MED ORDER — PROPOFOL 10 MG/ML IV BOLUS
INTRAVENOUS | Status: DC | PRN
  Administered 2012-11-14: 140 mg via INTRAVENOUS

## 2012-11-14 MED ORDER — NEOSTIGMINE METHYLSULFATE 1 MG/ML IJ SOLN
INTRAMUSCULAR | Status: DC | PRN
  Administered 2012-11-14: 4 mg via INTRAVENOUS

## 2012-11-14 MED ORDER — CISATRACURIUM BESYLATE (PF) 10 MG/5ML IV SOLN
INTRAVENOUS | Status: DC | PRN
  Administered 2012-11-14: 3 mg via INTRAVENOUS

## 2012-11-14 MED ORDER — GLYCOPYRROLATE 0.2 MG/ML IJ SOLN
INTRAMUSCULAR | Status: DC | PRN
  Administered 2012-11-14: 0.6 mg via INTRAVENOUS

## 2012-11-14 MED ORDER — SUCCINYLCHOLINE CHLORIDE 20 MG/ML IJ SOLN
INTRAMUSCULAR | Status: DC | PRN
  Administered 2012-11-14: 100 mg via INTRAVENOUS

## 2012-11-14 MED ORDER — SODIUM CHLORIDE 0.9 % IV SOLN
INTRAVENOUS | Status: DC | PRN
  Administered 2012-11-14: 14:00:00

## 2012-11-14 MED ORDER — SODIUM CHLORIDE 0.9 % IV SOLN: INTRAVENOUS | Status: DC

## 2012-11-14 MED ORDER — LACTATED RINGERS IV SOLN
INTRAVENOUS | Status: DC
  Administered 2012-11-14 (×2): via INTRAVENOUS

## 2012-11-14 NOTE — Anesthesia Postprocedure Evaluation (Signed)
  Anesthesia Post-op Note  Patient: [name]  Procedure(s) Performed: Procedure(s) (LRB): ENDOSCOPIC RETROGRADE CHOLANGIOPANCREATOGRAPHY (ERCP) (N/A)  Patient Location: PACU  Anesthesia Type: General  Level of Consciousness: awake and alert   Airway and Oxygen Therapy: Patient Spontanous Breathing  Post-op Pain: mild  Post-op Assessment: Post-op Vital signs reviewed, Patient's Cardiovascular Status Stable, Respiratory Function Stable, Patent Airway and No signs of Nausea or vomiting  Last Vitals:  Filed Vitals:   11/14/12 1520  BP:   Pulse:   Temp:   Resp: 20    Post-op Vital Signs: stable   Complications: No apparent anesthesia complications

## 2012-11-14 NOTE — Progress Notes (Signed)
[name] Sur 1:30 PM  Subjective: Patient with a bad day yesterday with abdominal pain and nausea some radiation to her back but no other new complaints and she was seen recently in the office  Objective: Vital signs stable afebrile physical exam please see pre-assessment evaluation  Assessment: Gallstones and probably  CBD stones  Plan: Okay to proceed with ERCP and anesthesia today with further workup and plans pending those findings  [facility]

## 2012-11-14 NOTE — Anesthesia Preprocedure Evaluation (Addendum)
Anesthesia Evaluation    History of Anesthesia Complications (+) PONV and history of anesthetic complications  Airway Mallampati: III TM Distance: >3 FB Neck ROM: Full    Dental  (+) Teeth Intact, Poor Dentition and Dental Advisory Given   Pulmonary shortness of breath and with exertion,    Pulmonary exam normal       Cardiovascular hypertension, Pt. on medications + Peripheral Vascular Disease + Valvular Problems/Murmurs Rhythm:Regular Rate:Normal     Neuro/Psych  Headaches, Depression  Neuromuscular disease    GI/Hepatic hiatal hernia, GERD-  ,  Endo/Other  Hypothyroidism   Renal/GU      Musculoskeletal  (+) Fibromyalgia -  Abdominal   Peds  Hematology   Anesthesia Other Findings Prior dislocation of TMJ/TMJ arthroplasty in 1990's  Reproductive/Obstetrics                          Anesthesia Physical Anesthesia Plan  ASA: II  Anesthesia Plan: General   Post-op Pain Management:    Induction: Intravenous  Airway Management Planned: Oral ETT  Additional Equipment:   Intra-op Plan:   Post-operative Plan:   Informed Consent: I have reviewed the patients History and Physical, chart, labs and discussed the procedure including the risks, benefits and alternatives for the proposed anesthesia with the patient or authorized representative who has indicated his/her understanding and acceptance.   Dental advisory given  Plan Discussed with: CRNA  Anesthesia Plan Comments:         Anesthesia Quick Evaluation

## 2012-11-14 NOTE — Progress Notes (Signed)
Small skin laceration  below lip on right side from plastic mouthpeice. Area cleaned. Small dressing applied. Patient made aware post op.

## 2012-11-14 NOTE — Op Note (Signed)
[facility] [address] [place], [address]   ERCP PROCEDURE REPORT  PATIENT: [name], [name].  MR# :[id number] BIRTHDATE: 02-01-1932  GENDER: Female ENDOSCOPIST: Vida Rigger, MD REFERRED BY: Luretha Murphy, M.D. PROCEDURE DATE:  11/14/2012 PROCEDURE:   ERCP with sphincterotomy/papillotomy and ERCP with removal of calculus/calculi ASA CLASS:    2 INDICATIONS: gallstones and probable CBD stones MEDICATIONS:    general anesthesia TOPICAL ANESTHETIC: none  DESCRIPTION OF PROCEDURE:   After the risks benefits and alternatives of the procedure were thoroughly explained, informed consent was obtained.  The Pentax ERCP [id number]  endoscope was introduced through the mouth and advanced to the second portion of the duodenum .a normal appearing ampulla was brought into view and using the triple-lumen sphincterotome loaded with the JAG Jagwire initially the first wire advancement went  towards the pancreas but the sphincterotome was repositioned and deep selective cannulation was obtained and the wire was advanced into the intrahepatics  and a cholangiogram was done which we initially thought showed a small stone and we proceeded with a medium-sized sphincterotomy in the customary fashion until he had adequate biliary drainage and could get the fully bowed sphincterotome easily in and out of the duct and we pulse proceeded with exchanging the sphincter tone with the adjustable balloon and proceeded with multiple balloon pull-through seizing the 12 mm balloon which passed readily through the duct without resistance however no stone was seen and we then proceeded with a occlusion cholangiogram which was normal and her cystic duct was patentand dilated and the gallbladder was filled with stonesand we elected to stop the procedure at this point and the patient had adequate biliary drainage and the wire and balloon were removed and the scope was removed and the patient  tolerated the procedure well there was no obvious immediate complication         COMPLICATIONS: none  ENDOSCOPIC IMPRESSION:1. Normal ampulla 2. One pancreatic duct wire advancement without injection3. Patent and dilated cystic duct with gallbladder filled with stones small 4. Normal CBD with questionable 1 small stones seen on initial cholangiogram status post sphincterotomy and multiple balloon pull-througs and negative occlusion cholangiogram at the end of the procedure RECOMMENDATIONS:observe for delayed complications if none probable surgical consultation electively for cholecystectomy     _______________________________ eSigned:  Vida Rigger, MD 11/14/2012 2:19 PM   XB:[id number] Daphine Deutscher, MD

## 2012-11-14 NOTE — Transfer of Care (Signed)
Immediate Anesthesia Transfer of Care Note  Patient: [name]  Procedure(s) Performed: Procedure(s): ENDOSCOPIC RETROGRADE CHOLANGIOPANCREATOGRAPHY (ERCP) (N/A)  Patient Location: PACU  Anesthesia Type:General  Level of Consciousness: awake, sedated and patient cooperative  Airway & Oxygen Therapy: Patient Spontanous Breathing and Patient connected to face mask oxygen  Post-op Assessment: Report given to PACU RN and Post -op Vital signs reviewed and stable  Post vital signs: Reviewed and stable  Complications: No apparent anesthesia complications

## 2012-11-15 ENCOUNTER — Emergency Department (HOSPITAL_COMMUNITY): Payer: Medicare Other

## 2012-11-15 ENCOUNTER — Inpatient Hospital Stay (HOSPITAL_COMMUNITY): Payer: Medicare Other

## 2012-11-15 ENCOUNTER — Encounter (HOSPITAL_COMMUNITY): Payer: Self-pay | Admitting: Gastroenterology

## 2012-11-15 ENCOUNTER — Inpatient Hospital Stay (HOSPITAL_COMMUNITY)
Admission: EM | Admit: 2012-11-15 | Discharge: 2012-11-19 | DRG: 417 | Disposition: A | Discharge status: Home / Self Care | Payer: Medicare Other | Attending: Internal Medicine | Admitting: Internal Medicine

## 2012-11-15 DIAGNOSIS — K560 Paralytic ileus: Secondary | ICD-10-CM | POA: Diagnosis not present

## 2012-11-15 DIAGNOSIS — E43 Unspecified severe protein-calorie malnutrition: Secondary | ICD-10-CM | POA: Diagnosis present

## 2012-11-15 DIAGNOSIS — K8010 Calculus of gallbladder with chronic cholecystitis without obstruction: Secondary | ICD-10-CM | POA: Diagnosis present

## 2012-11-15 DIAGNOSIS — Z96659 Presence of unspecified artificial knee joint: Secondary | ICD-10-CM

## 2012-11-15 DIAGNOSIS — I7090 Unspecified atherosclerosis: Secondary | ICD-10-CM | POA: Diagnosis present

## 2012-11-15 DIAGNOSIS — K8051 Calculus of bile duct without cholangitis or cholecystitis with obstruction: Secondary | ICD-10-CM | POA: Diagnosis present

## 2012-11-15 DIAGNOSIS — I700 Atherosclerosis of aorta: Secondary | ICD-10-CM | POA: Diagnosis present

## 2012-11-15 DIAGNOSIS — R011 Cardiac murmur, unspecified: Secondary | ICD-10-CM | POA: Diagnosis present

## 2012-11-15 DIAGNOSIS — F3289 Other specified depressive episodes: Secondary | ICD-10-CM | POA: Diagnosis present

## 2012-11-15 DIAGNOSIS — K859 Acute pancreatitis without necrosis or infection, unspecified: Principal | ICD-10-CM | POA: Diagnosis present

## 2012-11-15 DIAGNOSIS — Z85038 Personal history of other malignant neoplasm of large intestine: Secondary | ICD-10-CM

## 2012-11-15 DIAGNOSIS — I739 Peripheral vascular disease, unspecified: Secondary | ICD-10-CM | POA: Diagnosis present

## 2012-11-15 DIAGNOSIS — R109 Unspecified abdominal pain: Secondary | ICD-10-CM

## 2012-11-15 DIAGNOSIS — E119 Type 2 diabetes mellitus without complications: Secondary | ICD-10-CM | POA: Diagnosis present

## 2012-11-15 DIAGNOSIS — I10 Essential (primary) hypertension: Secondary | ICD-10-CM | POA: Diagnosis present

## 2012-11-15 DIAGNOSIS — K219 Gastro-esophageal reflux disease without esophagitis: Secondary | ICD-10-CM | POA: Diagnosis present

## 2012-11-15 DIAGNOSIS — IMO0001 Reserved for inherently not codable concepts without codable children: Secondary | ICD-10-CM | POA: Diagnosis present

## 2012-11-15 DIAGNOSIS — F329 Major depressive disorder, single episode, unspecified: Secondary | ICD-10-CM | POA: Diagnosis present

## 2012-11-15 DIAGNOSIS — R112 Nausea with vomiting, unspecified: Secondary | ICD-10-CM | POA: Diagnosis present

## 2012-11-15 DIAGNOSIS — E871 Hypo-osmolality and hyponatremia: Secondary | ICD-10-CM | POA: Diagnosis present

## 2012-11-15 DIAGNOSIS — E876 Hypokalemia: Secondary | ICD-10-CM | POA: Diagnosis present

## 2012-11-15 DIAGNOSIS — K8065 Calculus of gallbladder and bile duct with chronic cholecystitis with obstruction: Secondary | ICD-10-CM | POA: Diagnosis present

## 2012-11-15 DIAGNOSIS — K8061 Calculus of gallbladder and bile duct with cholecystitis, unspecified, with obstruction: Secondary | ICD-10-CM | POA: Diagnosis present

## 2012-11-15 DIAGNOSIS — E039 Hypothyroidism, unspecified: Secondary | ICD-10-CM | POA: Diagnosis present

## 2012-11-15 DIAGNOSIS — K449 Diaphragmatic hernia without obstruction or gangrene: Secondary | ICD-10-CM | POA: Diagnosis present

## 2012-11-15 DIAGNOSIS — E785 Hyperlipidemia, unspecified: Secondary | ICD-10-CM | POA: Diagnosis present

## 2012-11-15 HISTORY — DX: Hyperglycemia, unspecified: R73.9

## 2012-11-15 HISTORY — DX: Unspecified macular degeneration: H35.30

## 2012-11-15 HISTORY — DX: Atherosclerosis of aorta: I70.0

## 2012-11-15 LAB — CBC WITH DIFFERENTIAL/PLATELET
Basophils Absolute: 0 10*3/uL (ref 0.0–0.1)
Basophils Relative: 0 % (ref 0–1)
Eosinophils Absolute: 0 10*3/uL (ref 0.0–0.7)
Eosinophils Relative: 0 % (ref 0–5)
HCT: 40.2 % (ref 36.0–46.0)
Hemoglobin: 13.8 g/dL (ref 12.0–15.0)
MCH: 34.4 pg — ABNORMAL HIGH (ref 26.0–34.0)
MCHC: 34.3 g/dL (ref 30.0–36.0)
MCV: 100.2 fL — ABNORMAL HIGH (ref 78.0–100.0)
Monocytes Absolute: 1.2 10*3/uL — ABNORMAL HIGH (ref 0.1–1.0)
Monocytes Relative: 10 % (ref 3–12)
RBC: 4.01 MIL/uL (ref 3.87–5.11)
RDW: 12.3 % (ref 11.5–15.5)

## 2012-11-15 LAB — COMPREHENSIVE METABOLIC PANEL
ALT: 364 U/L — ABNORMAL HIGH (ref 0–35)
AST: 423 U/L — ABNORMAL HIGH (ref 0–37)
Albumin: 3.8 g/dL (ref 3.5–5.2)
BUN: 12 mg/dL (ref 6–23)
CO2: 26 mEq/L (ref 19–32)
Calcium: 10.4 mg/dL (ref 8.4–10.5)
Chloride: 95 mEq/L — ABNORMAL LOW (ref 96–112)
GFR calc non Af Amer: 80 mL/min — ABNORMAL LOW (ref >90)
Sodium: 133 mEq/L — ABNORMAL LOW (ref 135–145)
Total Bilirubin: 1.3 mg/dL — ABNORMAL HIGH (ref 0.3–1.2)

## 2012-11-15 MED ORDER — NIACIN ER (ANTIHYPERLIPIDEMIC) 500 MG PO TBCR
1000.0000 mg | EXTENDED_RELEASE_TABLET | Freq: Every day | ORAL | Status: DC
  Filled 2012-11-15 (×2): qty 2

## 2012-11-15 MED ORDER — MORPHINE SULFATE 2 MG/ML IJ SOLN
2.0000 mg | INTRAMUSCULAR | Status: DC | PRN
  Administered 2012-11-16 – 2012-11-18 (×4): 2 mg via INTRAVENOUS
  Filled 2012-11-15 (×4): qty 1

## 2012-11-15 MED ORDER — ZOLPIDEM TARTRATE 5 MG PO TABS
5.0000 mg | ORAL_TABLET | Freq: Every day | ORAL | Status: DC
  Administered 2012-11-15 – 2012-11-18 (×4): 5 mg via ORAL
  Filled 2012-11-15 (×4): qty 1

## 2012-11-15 MED ORDER — ZOLPIDEM TARTRATE 10 MG PO TABS: 10.0000 mg | ORAL_TABLET | Freq: Every day | ORAL | Status: DC

## 2012-11-15 MED ORDER — IOHEXOL 300 MG/ML  SOLN
50.0000 mL | Freq: Once | INTRAMUSCULAR | Status: AC | PRN
  Administered 2012-11-15: 50 mL via ORAL

## 2012-11-15 MED ORDER — ONDANSETRON HCL 4 MG PO TABS
4.0000 mg | ORAL_TABLET | Freq: Four times a day (QID) | ORAL | Status: DC | PRN
  Filled 2012-11-15: qty 1

## 2012-11-15 MED ORDER — SODIUM CHLORIDE 0.9 % IV SOLN
INTRAVENOUS | Status: DC
  Administered 2012-11-15: 16:00:00 via INTRAVENOUS

## 2012-11-15 MED ORDER — IOHEXOL 300 MG/ML  SOLN
100.0000 mL | Freq: Once | INTRAMUSCULAR | Status: AC | PRN
  Administered 2012-11-15: 100 mL via INTRAVENOUS

## 2012-11-15 MED ORDER — LEVOTHYROXINE SODIUM 137 MCG PO TABS: 137.0000 ug | ORAL_TABLET | ORAL | Status: DC

## 2012-11-15 MED ORDER — LEVOTHYROXINE SODIUM 125 MCG PO TABS
125.0000 ug | ORAL_TABLET | ORAL | Status: DC
  Administered 2012-11-19: 125 ug via ORAL
  Filled 2012-11-15 (×2): qty 1

## 2012-11-15 MED ORDER — PANTOPRAZOLE SODIUM 40 MG PO TBEC
40.0000 mg | DELAYED_RELEASE_TABLET | Freq: Every day | ORAL | Status: DC
  Administered 2012-11-16: 40 mg via ORAL
  Filled 2012-11-15 (×2): qty 1

## 2012-11-15 MED ORDER — AMLODIPINE BESYLATE 5 MG PO TABS
5.0000 mg | ORAL_TABLET | Freq: Every day | ORAL | Status: DC
  Administered 2012-11-16 – 2012-11-19 (×3): 5 mg via ORAL
  Filled 2012-11-15 (×4): qty 1

## 2012-11-15 MED ORDER — LEVOTHYROXINE SODIUM 137 MCG PO TABS
137.0000 ug | ORAL_TABLET | ORAL | Status: DC
  Administered 2012-11-16 – 2012-11-18 (×2): 137 ug via ORAL
  Filled 2012-11-15 (×3): qty 1

## 2012-11-15 MED ORDER — ONDANSETRON HCL 4 MG/2ML IJ SOLN
4.0000 mg | Freq: Four times a day (QID) | INTRAMUSCULAR | Status: DC | PRN
  Administered 2012-11-16: 4 mg via INTRAVENOUS
  Filled 2012-11-15: qty 2

## 2012-11-15 MED ORDER — ASPIRIN 325 MG PO TABS
325.0000 mg | ORAL_TABLET | Freq: Every day | ORAL | Status: DC
  Filled 2012-11-15 (×2): qty 1

## 2012-11-15 MED ORDER — ENOXAPARIN SODIUM 40 MG/0.4ML ~~LOC~~ SOLN
40.0000 mg | SUBCUTANEOUS | Status: DC
  Administered 2012-11-15: 40 mg via SUBCUTANEOUS
  Filled 2012-11-15 (×2): qty 0.4

## 2012-11-15 MED ORDER — TERIPARATIDE (RECOMBINANT) 600 MCG/2.4ML ~~LOC~~ SOLN: 20.0000 ug | Freq: Every day | SUBCUTANEOUS | Status: DC

## 2012-11-15 MED ORDER — NORTRIPTYLINE HCL 25 MG PO CAPS
75.0000 mg | ORAL_CAPSULE | Freq: Every morning | ORAL | Status: DC
  Administered 2012-11-16 – 2012-11-19 (×3): 75 mg via ORAL
  Filled 2012-11-15 (×4): qty 3

## 2012-11-15 MED ORDER — POTASSIUM CHLORIDE IN NACL 20-0.9 MEQ/L-% IV SOLN
INTRAVENOUS | Status: DC
  Administered 2012-11-15 – 2012-11-17 (×5): via INTRAVENOUS
  Filled 2012-11-15 (×5): qty 1000

## 2012-11-15 MED ORDER — SIMVASTATIN 20 MG PO TABS
20.0000 mg | ORAL_TABLET | Freq: Every day | ORAL | Status: DC
  Administered 2012-11-18: 20 mg via ORAL
  Filled 2012-11-15 (×5): qty 1

## 2012-11-15 MED ORDER — LEVOTHYROXINE SODIUM 125 MCG PO TABS: 125.0000 ug | ORAL_TABLET | ORAL | Status: DC

## 2012-11-15 MED ORDER — HYDRALAZINE HCL 20 MG/ML IJ SOLN
10.0000 mg | Freq: Four times a day (QID) | INTRAMUSCULAR | Status: DC | PRN
  Administered 2012-11-16 – 2012-11-17 (×2): 10 mg via INTRAVENOUS
  Filled 2012-11-15 (×2): qty 1

## 2012-11-15 MED ORDER — VITAMIN D (ERGOCALCIFEROL) 1.25 MG (50000 UNIT) PO CAPS: 50000.0000 [IU] | ORAL_CAPSULE | ORAL | Status: DC

## 2012-11-15 MED ORDER — MEDROXYPROGESTERONE ACETATE 2.5 MG PO TABS
2.5000 mg | ORAL_TABLET | Freq: Every day | ORAL | Status: DC
  Administered 2012-11-15 – 2012-11-16 (×2): 2.5 mg via ORAL
  Filled 2012-11-15 (×2): qty 1

## 2012-11-15 NOTE — H&P (Signed)
[facility] History and Physical  CIERRIA HEIGHT ZOX:[id number] DOB: 1932/09/19 DOA: 11/15/2012  Referring physician:  PCP: [name], MD  Specialists:   Chief Complaint: abdominal pain   HPI: [name] is a 79 y.o. female with PMH of HTN, HPL, hypothyroidism, h/o colon CA, GERD, hiatal hernia, cholelelithiasis who underwent ERCP with sphincterotomy on 10/2912 presented with epigastric abdominal pian associated with nausea vomiting found to have acute pancreatitis; denies chest pain, no dizziness, no fever, chills, no focal neuro symptoms  -Ed called d/w GI, awaiting to see the patient    Review of Systems: The patient denies fever, weight loss,, vision loss, decreased hearing, hoarseness, chest pain, syncope, dyspnea on exertion, peripheral edema, balance deficits, hemoptysis, hematochezia, severe  hematuria, incontinence, genital sores, muscle weakness, suspicious skin lesions, transient blindness, difficulty walking, depression, unusual weight change, abnormal bleeding, enlarged lymph nodes, angioedema, and breast masses.    Past Medical History  Diagnosis Date  . Hypertension   . Hypothyroid   . Goiter   . GERD (gastroesophageal reflux disease)   . Fibromyalgia   . Heart murmur   . Peripheral vascular disease     varicose veins in left arm   . Shortness of breath     with exertion   . Anemia     hx of in 2003   . Hiatal hernia   . Headache(784.0)     tension headache daily per pt   . Arthritis   . Depression   . DJD (degenerative joint disease)   . Osteoarthritis   . Colon cancer     s/p resecton of colon in 2003 , hx of chemo  . Headaches, cluster   . Abnormal uterine bleeding (AUB)     on cont HRT  . Colon cancer   . TMJ (dislocation of temporomandibular joint)   . Compression fracture jan 2012    due to fall  . PONV (postoperative nausea and vomiting)    Past Surgical History  Procedure Laterality Date  . Tmj arthroplasty  1990's  .  Appendectomy    . Colon resection    . Eye surgery      bilateral cataract surgery   . Total knee arthroplasty  12/20/2010    Procedure: TOTAL KNEE ARTHROPLASTY;  Surgeon: Shelda Pal;  Location: [facility];  Service: Orthopedics;  Laterality: Right;  . Colonoscopy w/ biopsies  09/25/2002, 09/18/2001  . Upper gastrointestinal endoscopy  09/18/2001  . Porta catheter placement  10/30/2001  . Porta catheter removal  06/27/2003  . Tmj arthroplasty    . Dilation and curettage of uterus    . Hysteroscopy    . Esophagogastroduodenoscopy endoscopy    . Vertebroplasty  2012  . Back surgery  2000    lower  . Tonsillectomy  age 89 or 8  . Colon surgery  2003  . Joint replacement Right dec 2012    knee  . Ercp N/A 11/14/2012    Procedure: ENDOSCOPIC RETROGRADE CHOLANGIOPANCREATOGRAPHY (ERCP);  Surgeon: Petra Kuba, MD;  Location: [facility];  Service: Endoscopy;  Laterality: N/A;   Social History:  reports that she has never smoked. She has never used smokeless tobacco. She reports that she does not drink alcohol or use illicit drugs. Home:  where does patient live--home, ALF, SNF? and with whom if at home? Yes:  Can patient participate in ADLs?  Allergies  Allergen Reactions  . Tape Rash    Blisters, nausea, and vomitting  . Latex Other (See  Comments)    blister  . Lidocaine Nausea And Vomiting  . Sulfur Nausea And Vomiting    Family History  Problem Relation Age of Onset  . Heart disease Mother     Enlarged heart  . Arrhythmia Mother   . Diabetes Mother   . Stroke Sister   . Heart disease Brother     MI at unknown age  . Anesthesia problems Brother   . Heart attack      MI at age 27  . Heart disease Brother     CABG    (be sure to complete)  Prior to Admission medications   Medication Sig Start Date End Date Taking? Authorizing Provider  acetaminophen (TYLENOL) 500 MG tablet Take 500 mg by mouth every 6 (six) hours as needed.    Yes Historical Provider, MD  amLODipine  (NORVASC) 5 MG tablet Take 5 mg by mouth every morning.    Yes Historical Provider, MD  aspirin 325 MG tablet Take 325 mg by mouth at bedtime.   Yes Historical Provider, MD  Calcium-Vitamin D (CALTRATE 600 PLUS-VIT D PO) Take 1 tablet by mouth 2 (two) times daily.    Yes Historical Provider, MD  docusate sodium (COLACE) 100 MG capsule Take 100 mg by mouth 3 (three) times daily as needed for constipation.   Yes Historical Provider, MD  estradiol (ESTRACE) 0.5 MG tablet Take 0.5 tablets (0.25 mg total) by mouth daily. 07/26/12  Yes Patricia Rolen-Grubb, FNP  levothyroxine (SYNTHROID) 125 MCG tablet Take 125 mcg by mouth See admin instructions. Monday thru Thursday   Yes Historical Provider, MD  levothyroxine (SYNTHROID) 137 MCG tablet Take 137 mcg by mouth See admin instructions. Friday thru Sunday   Yes Historical Provider, MD  medroxyPROGESTERone (PROVERA) 5 MG tablet Take 0.5 tablets (2.5 mg total) by mouth daily. 2.5 mg tab daily 07/26/12  Yes Patricia Rolen-Grubb, FNP  Multiple Vitamins-Minerals (MULTI COMPLETE PO) Take by mouth daily.    Yes Historical Provider, MD  Multiple Vitamins-Minerals (PRESERVISION AREDS PO) Take 1 tablet by mouth 2 (two) times daily.   Yes Historical Provider, MD  niacin (NIASPAN) 1000 MG CR tablet Take 1,000 mg by mouth at bedtime.    Yes Historical Provider, MD  nortriptyline (PAMELOR) 25 MG capsule Take 75 mg by mouth every morning.    Yes Historical Provider, MD  omeprazole (PRILOSEC) 40 MG capsule Take 40 mg by mouth 2 (two) times daily.   Yes Historical Provider, MD  simvastatin (ZOCOR) 20 MG tablet Take 20 mg by mouth at bedtime.    Yes Historical Provider, MD  Teriparatide, Recombinant, (FORTEO [place]) Inject 20 mcg into the skin daily.    Yes Historical Provider, MD  vitamin B-12 (CYANOCOBALAMIN) 1000 MCG tablet Take 1,000 mcg by mouth daily.   Yes Historical Provider, MD  vitamin C (ASCORBIC ACID) 500 MG tablet Take 500 mg by mouth daily.    Yes Historical Provider,  MD  Vitamin D, Ergocalciferol, (DRISDOL) 50000 UNITS CAPS capsule Take 1 capsule (50,000 Units total) by mouth every 14 (fourteen) days. 08/31/12  Yes Patricia Rolen-Grubb, FNP  vitamin E 400 UNIT capsule Take 400 Units by mouth 2 (two) times daily.    Yes Historical Provider, MD  zolpidem (AMBIEN) 10 MG tablet Take 10 mg by mouth at bedtime. 06/15/11  Yes Historical Provider, MD   Physical Exam: Filed Vitals:   11/15/12 1259  BP: 175/85  Pulse: 89  Temp: 98.2 F (36.8 C)  Resp: 23  General:  alert  Eyes: EOM-i  ENT: no oral ulcers   Neck: supple   Cardiovascular: s1,s2 rrr  Respiratory: LL crackles ? atelectasis   Abdomen: soft, epigastric tender, no rebound, + BS   Skin: no rash   Musculoskeletal: no LE edema   Psychiatric: no hallucinations   Neurologic: CN 2-12 intact   Labs on Admission:  Basic Metabolic Panel:  Recent Labs Lab 11/15/12 1313  NA 133*  K 3.4*  CL 95*  CO2 26  GLUCOSE 155*  BUN 12  CREATININE 0.75  CALCIUM 10.4   Liver Function Tests:  Recent Labs Lab 11/15/12 1313  AST 423*  ALT 364*  ALKPHOS 318*  BILITOT 1.3*  PROT 8.2  ALBUMIN 3.8    Recent Labs Lab 11/15/12 1313  LIPASE 2768*   No results found for this basename: AMMONIA,  in the last 168 hours CBC:  Recent Labs Lab 11/15/12 1313  WBC 11.9*  NEUTROABS 9.6*  HGB 13.8  HCT 40.2  MCV 100.2*  PLT 274   Cardiac Enzymes: No results found for this basename: CKTOTAL, CKMB, CKMBINDEX, TROPONINI,  in the last 168 hours  BNP (last 3 results) No results found for this basename: PROBNP,  in the last 8760 hours CBG: No results found for this basename: GLUCAP,  in the last 168 hours  Radiological Exams on Admission: Dg Ercp With Sphincterotomy  11/14/2012   CLINICAL DATA:  Gallstones.  EXAM: ERCP  TECHNIQUE: Multiple spot images obtained with the fluoroscopic device and submitted for interpretation post-procedure.  COMPARISON:  Ultrasound 11/02/2012  FINDINGS:  The common bile duct was cannulated and opacified with contrast. There may be partial filling of the gallbladder and cystic duct. Balloon was inflated within the common bile duct for stone removal. No definite filling defects within the common bile duct.  IMPRESSION: Opacification of the common bile duct. No definite stones were identified on these images.  These images were submitted for radiologic interpretation only. Please see the procedural report for the amount of contrast and the fluoroscopy time utilized.   Electronically Signed   By: Richarda Overlie M.D.   On: 11/14/2012 16:58   Dg Abd Acute W/chest  11/15/2012   CLINICAL DATA:  Epigastric pain status post ERCP.  EXAM: ACUTE ABDOMEN SERIES (ABDOMEN 2 VIEW & CHEST 1 VIEW)  COMPARISON:  CHEST x-ray 09/14/2010. CT of the abdomen and pelvis 06/29/2011.  FINDINGS: Lung volumes are normal. No consolidative airspace disease. No pleural effusions. No pneumothorax. No pulmonary nodule or mass noted. Pulmonary vasculature and the cardiomediastinal silhouette are within normal limits. Atherosclerosis in the thoracic aorta.  Small amount of contrast material is noted throughout the colon. Gas and stool are seen scattered throughout the colon extending to the level of the distal rectum. No pathologic distension of small bowel is noted. No gross evidence of pneumoperitoneum.  IMPRESSION: 1. Nonobstructive bowel gas pattern. 2. No pneumoperitoneum. 3. No radiographic evidence of acute cardiopulmonary disease. 4. Atherosclerosis.   Electronically Signed   By: Trudie Reed M.D.   On: 11/15/2012 13:54    EKG: Independently reviewed. NSR, no acute ST/T changes   Assessment/Plan Principal Problem:   Pancreatitis, acute Active Problems:   Hypertension   Hypothyroidism  79 y.o. female with PMH of HTN, HPL, hypothyroidism, h/o colon CA, GERD, hiatal hernia, cholelelithiasis who underwent ERCP with sphincterotomy on 10/2912 presented with epigastric abdominal pian  associated with nausea vomiting found to have acute pancreatitis;   1. Acute pancreatitis post ERCP with  sphincterotomy on 10/2912; elevated lipase, LFTs, alk phos;  -NPO, IVF, antiemetics; pain control; f/u labs in AM; GI consulted from ED; pend CT abd; may likely need surgical cholecystectomy   2. HTN uncontrolled; resume home meds; prn hydralazine;   3. Hypothyroidism, no recent TSH; cont levothyroxine, check tsh    4. Hypo K, Hypo Na; replace; recheck in AM   5. GERD cont PPI;   DVT proph: SQ lovenox   GI;  if consultant consulted, please document name and whether formally or informally consulted  Code Status: full (must indicate code status--if unknown or must be presumed, indicate so) Family Communication: son at the bedside (indicate person spoken with, if applicable, with phone number if by telephone) Disposition Plan: home when reeady (indicate anticipated LOS)  Time spent: >45 minutes   Esperanza Sheets [facility] Pager [phone]  If 7PM-7AM, please contact night-coverage [email or web address] Password [email or web address] 11/15/2012, 2:52 PM

## 2012-11-15 NOTE — ED Provider Notes (Signed)
CSN: [id number]     Arrival date & time 11/15/12  1232 History   First MD Initiated Contact with Patient 11/15/12 1244     No chief complaint on file.  (Consider location/radiation/quality/duration/timing/severity/associated sxs/prior Treatment) HPI Comments: Patient with past medical history remarkable for colon cancer, presents emergency department with chief complaint of epigastric pain. Patient states that she was seen yesterday by Dr. Ewing Schlein from GI, and had an ERCP performed. Patient states that the pain has worsened in her epigastric region. She called the GI office, and was advised to come to the emergency department. Patient states that she feels like she has gas that she needs to pass. She states that she has had frequent burping since the procedure. States that her level of discomfort is moderate. She has not tried alleviate her symptoms.  The history is provided by the patient. No language interpreter was used.    Past Medical History  Diagnosis Date  . Hypertension   . Hypothyroid   . Goiter   . GERD (gastroesophageal reflux disease)   . Fibromyalgia   . Heart murmur   . Peripheral vascular disease     varicose veins in left arm   . Shortness of breath     with exertion   . Anemia     hx of in 2003   . Hiatal hernia   . Headache(784.0)     tension headache daily per pt   . Arthritis   . Depression   . DJD (degenerative joint disease)   . Osteoarthritis   . Colon cancer     s/p resecton of colon in 2003 , hx of chemo  . Headaches, cluster   . Abnormal uterine bleeding (AUB)     on cont HRT  . Colon cancer   . TMJ (dislocation of temporomandibular joint)   . Compression fracture jan 2012    due to fall  . PONV (postoperative nausea and vomiting)    Past Surgical History  Procedure Laterality Date  . Tmj arthroplasty  1990's  . Appendectomy    . Colon resection    . Eye surgery      bilateral cataract surgery   . Total knee arthroplasty  12/20/2010   Procedure: TOTAL KNEE ARTHROPLASTY;  Surgeon: Shelda Pal;  Location: [facility];  Service: Orthopedics;  Laterality: Right;  . Colonoscopy w/ biopsies  09/25/2002, 09/18/2001  . Upper gastrointestinal endoscopy  09/18/2001  . Porta catheter placement  10/30/2001  . Porta catheter removal  06/27/2003  . Tmj arthroplasty    . Dilation and curettage of uterus    . Hysteroscopy    . Esophagogastroduodenoscopy endoscopy    . Vertebroplasty  2012  . Back surgery  2000    lower  . Tonsillectomy  age 32 or 8  . Colon surgery  2003  . Joint replacement Right dec 2012    knee  . Ercp N/A 11/14/2012    Procedure: ENDOSCOPIC RETROGRADE CHOLANGIOPANCREATOGRAPHY (ERCP);  Surgeon: Petra Kuba, MD;  Location: [facility];  Service: Endoscopy;  Laterality: N/A;   Family History  Problem Relation Age of Onset  . Heart disease Mother     Enlarged heart  . Arrhythmia Mother   . Diabetes Mother   . Stroke Sister   . Heart disease Brother     MI at unknown age  . Anesthesia problems Brother   . Heart attack      MI at age 72  . Heart disease Brother  CABG   History  Substance Use Topics  . Smoking status: Never Smoker   . Smokeless tobacco: Never Used  . Alcohol Use: No   OB History   Grav Para Term Preterm Abortions TAB SAB Ect Mult Living   1 1        1      Review of Systems  All other systems reviewed and are negative.    Allergies  Tape; Latex; Lidocaine; and Sulfur  Home Medications   Current Outpatient Rx  Name  Route  Sig  Dispense  Refill  . acetaminophen (TYLENOL) 500 MG tablet   Oral   Take 500 mg by mouth every 6 (six) hours as needed.          Marland Kitchen amLODipine (NORVASC) 5 MG tablet   Oral   Take 5 mg by mouth every morning.          Marland Kitchen aspirin 325 MG tablet   Oral   Take 325 mg by mouth at bedtime.         . Calcium-Vitamin D (CALTRATE 600 PLUS-VIT D PO)   Oral   Take 1 tablet by mouth 2 (two) times daily.          Marland Kitchen docusate sodium (COLACE) 100 MG  capsule   Oral   Take 100 mg by mouth 3 (three) times daily as needed for constipation.         Marland Kitchen estradiol (ESTRACE) 0.5 MG tablet   Oral   Take 0.5 tablets (0.25 mg total) by mouth daily.   90 tablet   3   . levothyroxine (SYNTHROID) 125 MCG tablet   Oral   Take 125 mcg by mouth See admin instructions. Monday thru Thursday         . levothyroxine (SYNTHROID) 137 MCG tablet   Oral   Take 137 mcg by mouth See admin instructions. Friday thru Sunday         . medroxyPROGESTERone (PROVERA) 5 MG tablet   Oral   Take 0.5 tablets (2.5 mg total) by mouth daily. 2.5 mg tab daily   90 tablet   3   . Multiple Vitamins-Minerals (MULTI COMPLETE PO)   Oral   Take by mouth daily.          . Multiple Vitamins-Minerals (PRESERVISION AREDS PO)   Oral   Take 1 tablet by mouth 2 (two) times daily.         . niacin (NIASPAN) 1000 MG CR tablet   Oral   Take 1,000 mg by mouth at bedtime.          . nortriptyline (PAMELOR) 25 MG capsule   Oral   Take 75 mg by mouth every morning.          Marland Kitchen omeprazole (PRILOSEC) 40 MG capsule   Oral   Take 40 mg by mouth 2 (two) times daily.         . simvastatin (ZOCOR) 20 MG tablet   Oral   Take 20 mg by mouth at bedtime.          . Teriparatide, Recombinant, (FORTEO [place])   Subcutaneous   Inject 20 mcg into the skin daily.          . vitamin B-12 (CYANOCOBALAMIN) 1000 MCG tablet   Oral   Take 1,000 mcg by mouth daily.         . vitamin C (ASCORBIC ACID) 500 MG tablet   Oral   Take 500 mg by mouth  daily.          . Vitamin D, Ergocalciferol, (DRISDOL) 50000 UNITS CAPS capsule   Oral   Take 1 capsule (50,000 Units total) by mouth every 14 (fourteen) days.   26 capsule   1   . vitamin E 400 UNIT capsule   Oral   Take 400 Units by mouth 2 (two) times daily.          Marland Kitchen zolpidem (AMBIEN) 10 MG tablet   Oral   Take 10 mg by mouth at bedtime.          BP 175/85  Pulse 89  Temp(Src) 98.2 F (36.8 C) (Oral)   Resp 23  SpO2 95% Physical Exam  Nursing note and vitals reviewed. Constitutional: She is oriented to person, place, and time. She appears well-developed and well-nourished.  HENT:  Head: Normocephalic and atraumatic.  Eyes: Conjunctivae and EOM are normal. Pupils are equal, round, and reactive to light.  Neck: Normal range of motion. Neck supple.  Cardiovascular: Normal rate and regular rhythm.  Exam reveals no gallop and no friction rub.   No murmur heard. Pulmonary/Chest: Effort normal and breath sounds normal. No respiratory distress. She has no wheezes. She has no rales. She exhibits no tenderness.  Abdominal: Soft. She exhibits no distension and no mass. There is tenderness. There is no rebound and no guarding.  TTP in the epigastric region, no other focal abdominal tenderness  Musculoskeletal: Normal range of motion. She exhibits no edema and no tenderness.  Neurological: She is alert and oriented to person, place, and time.  Skin: Skin is warm and dry.  Psychiatric: She has a normal mood and affect. Her behavior is normal. Judgment and thought content normal.    ED Course  Procedures (including critical care time) Results for orders placed during the hospital encounter of 11/15/12  CBC WITH DIFFERENTIAL      Result Value Range   WBC 11.9 (*) 4.0 - 10.5 K/uL   RBC 4.01  3.87 - 5.11 MIL/uL   Hemoglobin 13.8  12.0 - 15.0 g/dL   HCT 40.9  81.1 - 91.4 %   MCV 100.2 (*) 78.0 - 100.0 fL   MCH 34.4 (*) 26.0 - 34.0 pg   MCHC 34.3  30.0 - 36.0 g/dL   RDW 78.2  95.6 - 21.3 %   Platelets 274  150 - 400 K/uL   Neutrophils Relative % 80 (*) 43 - 77 %   Neutro Abs 9.6 (*) 1.7 - 7.7 K/uL   Lymphocytes Relative 9 (*) 12 - 46 %   Lymphs Abs 1.1  0.7 - 4.0 K/uL   Monocytes Relative 10  3 - 12 %   Monocytes Absolute 1.2 (*) 0.1 - 1.0 K/uL   Eosinophils Relative 0  0 - 5 %   Eosinophils Absolute 0.0  0.0 - 0.7 K/uL   Basophils Relative 0  0 - 1 %   Basophils Absolute 0.0  0.0 - 0.1 K/uL   LIPASE, BLOOD      Result Value Range   Lipase 2768 (*) 11 - 59 U/L  COMPREHENSIVE METABOLIC PANEL      Result Value Range   Sodium 133 (*) 135 - 145 mEq/L   Potassium 3.4 (*) 3.5 - 5.1 mEq/L   Chloride 95 (*) 96 - 112 mEq/L   CO2 26  19 - 32 mEq/L   Glucose, Bld 155 (*) 70 - 99 mg/dL   BUN 12  6 - 23 mg/dL  Creatinine, Ser 0.75  0.50 - 1.10 mg/dL   Calcium 11.9  8.4 - 14.7 mg/dL   Total Protein 8.2  6.0 - 8.3 g/dL   Albumin 3.8  3.5 - 5.2 g/dL   AST 829 (*) 0 - 37 U/L   ALT 364 (*) 0 - 35 U/L   Alkaline Phosphatase 318 (*) 39 - 117 U/L   Total Bilirubin 1.3 (*) 0.3 - 1.2 mg/dL   GFR calc non Af Amer 80 (*) >90 mL/min   GFR calc Af Amer >90  >90 mL/min   US Abdomen Complete  11/02/2012   CLINICAL DATA:  Abdominal pain.  History of colon cancer.  EXAM: ULTRASOUND ABDOMEN COMPLETE  COMPARISON:  CT scan dated 06/29/2011  FINDINGS: Gallbladder  Multiple gallstones. Gallbladder wall is not thickened. Negative sonographic Murphy's sign.  Common bile duct  Diameter: 11.4 mm, enlarged.  No visible mass or stone.  Liver  No focal lesions. Slight intrahepatic ductal dilatation.  IVC  Normal.  Pancreas  Normal.  Spleen  Normal. 4.1 cm in length.  Right Kidney  Length: 9.8 cm. Echogenicity within normal limits. No mass or hydronephrosis visualized.  Left Kidney  Length: 9.6 cm. Echogenicity within normal limits. No mass or hydronephrosis visualized.  Abdominal aorta  Maximum diameter 2.8 cm.  IMPRESSION: Multiple gallstones, unchanged. New dilatation of the common bile duct suggesting the possibility of a distal common bile duct stone or obstructing mass.   Electronically Signed   By: Geanie Cooley M.D.   On: 11/02/2012 10:40   Dg Ercp With Sphincterotomy  11/14/2012   CLINICAL DATA:  Gallstones.  EXAM: ERCP  TECHNIQUE: Multiple spot images obtained with the fluoroscopic device and submitted for interpretation post-procedure.  COMPARISON:  Ultrasound 11/02/2012  FINDINGS: The common bile duct was  cannulated and opacified with contrast. There may be partial filling of the gallbladder and cystic duct. Balloon was inflated within the common bile duct for stone removal. No definite filling defects within the common bile duct.  IMPRESSION: Opacification of the common bile duct. No definite stones were identified on these images.  These images were submitted for radiologic interpretation only. Please see the procedural report for the amount of contrast and the fluoroscopy time utilized.   Electronically Signed   By: Richarda Overlie M.D.   On: 11/14/2012 16:58   Dg Abd Acute W/chest  11/15/2012   CLINICAL DATA:  Epigastric pain status post ERCP.  EXAM: ACUTE ABDOMEN SERIES (ABDOMEN 2 VIEW & CHEST 1 VIEW)  COMPARISON:  CHEST x-ray 09/14/2010. CT of the abdomen and pelvis 06/29/2011.  FINDINGS: Lung volumes are normal. No consolidative airspace disease. No pleural effusions. No pneumothorax. No pulmonary nodule or mass noted. Pulmonary vasculature and the cardiomediastinal silhouette are within normal limits. Atherosclerosis in the thoracic aorta.  Small amount of contrast material is noted throughout the colon. Gas and stool are seen scattered throughout the colon extending to the level of the distal rectum. No pathologic distension of small bowel is noted. No gross evidence of pneumoperitoneum.  IMPRESSION: 1. Nonobstructive bowel gas pattern. 2. No pneumoperitoneum. 3. No radiographic evidence of acute cardiopulmonary disease. 4. Atherosclerosis.   Electronically Signed   By: Trudie Reed M.D.   On: 11/15/2012 13:54    Imaging Review Dg Ercp With Sphincterotomy  11/14/2012   CLINICAL DATA:  Gallstones.  EXAM: ERCP  TECHNIQUE: Multiple spot images obtained with the fluoroscopic device and submitted for interpretation post-procedure.  COMPARISON:  Ultrasound 11/02/2012  FINDINGS: The common  bile duct was cannulated and opacified with contrast. There may be partial filling of the gallbladder and cystic duct.  Balloon was inflated within the common bile duct for stone removal. No definite filling defects within the common bile duct.  IMPRESSION: Opacification of the common bile duct. No definite stones were identified on these images.  These images were submitted for radiologic interpretation only. Please see the procedural report for the amount of contrast and the fluoroscopy time utilized.   Electronically Signed   By: Richarda Overlie M.D.   On: 11/14/2012 16:58    EKG Interpretation   None       MDM   1. Abdominal  pain, other specified site     The patient with abdominal pain. She was seen yesterday by GI, and had an ERCP performed. She is now complaining of worsening abdominal pain. Lipase is 2700, LFTs are also very elevated, suspect the patient has iatrogenic pancreatitis. Will admit the patient.  Discussed patient with the on-call gastroenterology, who will offer a formal consult. Do not recommend surgical involvement at this time. Will order CT abdomen pelvis.  Patient discussed with the hospitalist.. Will admit the patient. CT abdomen and pelvis is pending.    Roxy Horseman, PA-C 11/15/12 [phone]

## 2012-11-15 NOTE — Progress Notes (Signed)
[name] 5:44 PM  Subjective: Thanks to hospital team for admitting patient in patient with status post ERCP yesterday with only 1 wire advanced towards the pancreas and sphincterotomy and multiple balloon pull-through who had nausea vomiting and left upper quadrant abdominal pain last night and decrease pain today without signs of bleeding except for one minimal clot in her vomitus and no bowel movements and although her lipase was significantly elevated her pancreas was okay on CT and she drank the contrast without problem and is feeling better and has no new complaint and no fever or chills  Objective: Vital signs stable afebrile no acute distress abdomen is soft essentially nontender good bowel sounds no guarding or rebound labs and CT reviewed and discussed with the patient and reassurance was given  Assessment: Symptomatic gallstones status post ERCP for presumed CBD stones with possible mild post procedure pancreatitis versus distal duct swelling from the procedure  Plan: Hopefully labs will improve tomorrow and hopefully she'll be able to advance her diet soon and I would consult surgery tomorrow for cholecystectomy at some point in the future and I will ask one of my partners to round on tomorrow and please call us sooner when necessary in the meantime I will allow clear liquids  [name] E

## 2012-11-15 NOTE — ED Notes (Signed)
Pt reports to ed with c/o abdominal pain x 7 weeks, sts  It's gotten much worse since procedure done on her pancrease yesterday.

## 2012-11-15 NOTE — Progress Notes (Signed)
Utilization Review completed.  Dexter Sauser RN CM  

## 2012-11-15 NOTE — ED Provider Notes (Signed)
Medical screening examination/treatment/procedure(s) were conducted as a shared visit with non-physician practitioner(s) and myself.  I personally evaluated the patient during the encounter.  EKG Interpretation     Ventricular Rate:  82 PR Interval:  125 QRS Duration: 96 QT Interval:  373 QTC Calculation: 436 R Axis:   16 Text Interpretation:  Sinus rhythm No significant change since last tracing            Pt with recent ERCP due to dilated CBD and gallstones.  Today appears that pt has iatrogenic pancreatitis and transaminitis.  Will admit and discuss with GI  Gwyneth Sprout, MD 11/15/12 [phone]

## 2012-11-15 NOTE — ED Notes (Signed)
Patient transported to CT 

## 2012-11-16 ENCOUNTER — Encounter (HOSPITAL_COMMUNITY): Payer: Self-pay | Admitting: Internal Medicine

## 2012-11-16 DIAGNOSIS — E871 Hypo-osmolality and hyponatremia: Secondary | ICD-10-CM | POA: Diagnosis present

## 2012-11-16 DIAGNOSIS — E119 Type 2 diabetes mellitus without complications: Secondary | ICD-10-CM | POA: Diagnosis present

## 2012-11-16 DIAGNOSIS — I700 Atherosclerosis of aorta: Secondary | ICD-10-CM

## 2012-11-16 DIAGNOSIS — R739 Hyperglycemia, unspecified: Secondary | ICD-10-CM

## 2012-11-16 DIAGNOSIS — K8020 Calculus of gallbladder without cholecystitis without obstruction: Secondary | ICD-10-CM

## 2012-11-16 DIAGNOSIS — K219 Gastro-esophageal reflux disease without esophagitis: Secondary | ICD-10-CM | POA: Diagnosis present

## 2012-11-16 DIAGNOSIS — K8051 Calculus of bile duct without cholangitis or cholecystitis with obstruction: Secondary | ICD-10-CM | POA: Diagnosis present

## 2012-11-16 DIAGNOSIS — E876 Hypokalemia: Secondary | ICD-10-CM | POA: Diagnosis present

## 2012-11-16 DIAGNOSIS — R109 Unspecified abdominal pain: Secondary | ICD-10-CM

## 2012-11-16 DIAGNOSIS — I7090 Unspecified atherosclerosis: Secondary | ICD-10-CM | POA: Diagnosis present

## 2012-11-16 HISTORY — DX: Hyperglycemia, unspecified: R73.9

## 2012-11-16 HISTORY — DX: Atherosclerosis of aorta: I70.0

## 2012-11-16 LAB — COMPREHENSIVE METABOLIC PANEL
ALT: 255 U/L — ABNORMAL HIGH (ref 0–35)
Alkaline Phosphatase: 290 U/L — ABNORMAL HIGH (ref 39–117)
CO2: 23 mEq/L (ref 19–32)
GFR calc Af Amer: >90 mL/min (ref >90)
GFR calc non Af Amer: 85 mL/min — ABNORMAL LOW (ref >90)
Glucose, Bld: 149 mg/dL — ABNORMAL HIGH (ref 70–99)
Potassium: 3.6 mEq/L (ref 3.5–5.1)
Sodium: 133 mEq/L — ABNORMAL LOW (ref 135–145)
Total Bilirubin: 0.9 mg/dL (ref 0.3–1.2)
Total Protein: 7.7 g/dL (ref 6.0–8.3)

## 2012-11-16 LAB — CBC WITH DIFFERENTIAL/PLATELET
Basophils Relative: 0 % (ref 0–1)
Eosinophils Absolute: 0 10*3/uL (ref 0.0–0.7)
Eosinophils Relative: 0 % (ref 0–5)
Hemoglobin: 13.5 g/dL (ref 12.0–15.0)
MCH: 34.6 pg — ABNORMAL HIGH (ref 26.0–34.0)
MCHC: 34.6 g/dL (ref 30.0–36.0)
MCV: 100 fL (ref 78.0–100.0)
Monocytes Absolute: 1.3 10*3/uL — ABNORMAL HIGH (ref 0.1–1.0)
Monocytes Relative: 10 % (ref 3–12)
Neutro Abs: 10.4 10*3/uL — ABNORMAL HIGH (ref 1.7–7.7)
Neutrophils Relative %: 81 % — ABNORMAL HIGH (ref 43–77)
RDW: 12.4 % (ref 11.5–15.5)

## 2012-11-16 LAB — HEMOGLOBIN A1C: Mean Plasma Glucose: 148 mg/dL — ABNORMAL HIGH (ref <117)

## 2012-11-16 LAB — LIPASE, BLOOD: Lipase: 166 U/L — ABNORMAL HIGH (ref 11–59)

## 2012-11-16 MED ORDER — VITAMIN C 500 MG PO TABS
500.0000 mg | ORAL_TABLET | Freq: Every day | ORAL | Status: DC
  Administered 2012-11-16 – 2012-11-19 (×3): 500 mg via ORAL
  Filled 2012-11-16 (×4): qty 1

## 2012-11-16 MED ORDER — CALCIUM CITRATE-VITAMIN D 500-400 MG-UNIT PO CHEW
1.0000 | CHEWABLE_TABLET | Freq: Every day | ORAL | Status: DC
  Administered 2012-11-16: 1 via ORAL
  Filled 2012-11-16: qty 1

## 2012-11-16 MED ORDER — ESTRADIOL 0.5 MG PO TABS: 0.2500 mg | ORAL_TABLET | Freq: Every day | ORAL | Status: DC

## 2012-11-16 MED ORDER — CALCIUM CITRATE-VITAMIN D 500-400 MG-UNIT PO CHEW: 1.0000 | CHEWABLE_TABLET | Freq: Two times a day (BID) | ORAL | Status: DC

## 2012-11-16 MED ORDER — CALCIUM CARBONATE-VITAMIN D 500-200 MG-UNIT PO TABS
1.0000 | ORAL_TABLET | Freq: Two times a day (BID) | ORAL | Status: DC
  Administered 2012-11-16 – 2012-11-19 (×4): 1 via ORAL
  Filled 2012-11-16 (×8): qty 1

## 2012-11-16 MED ORDER — VITAMIN B-12 1000 MCG PO TABS
1000.0000 ug | ORAL_TABLET | Freq: Every day | ORAL | Status: DC
  Administered 2012-11-16 – 2012-11-19 (×3): 1000 ug via ORAL
  Filled 2012-11-16 (×4): qty 1

## 2012-11-16 MED ORDER — BOOST / RESOURCE BREEZE PO LIQD: 1.0000 | Freq: Two times a day (BID) | ORAL | Status: DC

## 2012-11-16 MED ORDER — ESTRADIOL 1 MG PO TABS
0.5000 mg | ORAL_TABLET | ORAL | Status: DC
  Administered 2012-11-16 – 2012-11-19 (×2): 0.5 mg via ORAL
  Filled 2012-11-16 (×3): qty 0.5

## 2012-11-16 MED ORDER — ENOXAPARIN SODIUM 40 MG/0.4ML ~~LOC~~ SOLN: 40.0000 mg | SUBCUTANEOUS | Status: DC

## 2012-11-16 MED ORDER — PANTOPRAZOLE SODIUM 40 MG PO TBEC: 40.0000 mg | DELAYED_RELEASE_TABLET | Freq: Two times a day (BID) | ORAL | Status: DC

## 2012-11-16 MED ORDER — DOCUSATE SODIUM 100 MG PO CAPS
100.0000 mg | ORAL_CAPSULE | Freq: Three times a day (TID) | ORAL | Status: DC | PRN
  Administered 2012-11-16: 100 mg via ORAL
  Filled 2012-11-16: qty 1

## 2012-11-16 MED ORDER — MEDROXYPROGESTERONE ACETATE 5 MG PO TABS
5.0000 mg | ORAL_TABLET | ORAL | Status: DC
  Administered 2012-11-16 – 2012-11-19 (×2): 5 mg via ORAL
  Filled 2012-11-16 (×3): qty 1

## 2012-11-16 MED ORDER — PANTOPRAZOLE SODIUM 40 MG PO TBEC
40.0000 mg | DELAYED_RELEASE_TABLET | Freq: Two times a day (BID) | ORAL | Status: DC
  Administered 2012-11-16 – 2012-11-19 (×4): 40 mg via ORAL
  Filled 2012-11-16 (×7): qty 1

## 2012-11-16 MED ORDER — PROSIGHT PO TABS
1.0000 | ORAL_TABLET | Freq: Every day | ORAL | Status: DC
  Administered 2012-11-16 – 2012-11-19 (×3): 1 via ORAL
  Filled 2012-11-16 (×4): qty 1

## 2012-11-16 MED ORDER — VITAMIN E 180 MG (400 UNIT) PO CAPS
400.0000 [IU] | ORAL_CAPSULE | Freq: Two times a day (BID) | ORAL | Status: DC
  Administered 2012-11-16 – 2012-11-19 (×4): 400 [IU] via ORAL
  Filled 2012-11-16 (×8): qty 1

## 2012-11-16 NOTE — Progress Notes (Signed)
The patient had some significant pain last night, enough to require pain medication, but is feeling better this morning. Tolerating small to moderate amounts of clear liquids without any evident provocation of symptoms. No nausea or vomiting. Hasn't had a bowel movement for several days. Her pain is somewhat migratory. Yesterday, it was in the lower chest region in the left upper quadrant, last night it was more in the epigastric area.  Exam: The patient's sitting in the chair in absolutely no distress, fully awake and alert. She is anicteric and without pallor. The heart has a regular rhythm but a slightly rapid rate, around 110. Chest clear. Abdomen has quiet bowel sounds, slight right upper quadrant tenderness without significant guarding and certainly no peritoneal findings. Epigastric area and left upper quadrant are nontender.  Labs: White count slightly elevated at 12,800, as compared to 11,900 yesterday. Lipase markedly improved, from 2700 to 166. Liver chemistries also significantly improved, roughly 50% since yesterday. BUN and creatinine both are normal, and even lower than yesterday afternoon.  Impression: Hyperlipasemia with abdominal discomfort but negative CT findings for pancreatitis. Marked biochemical improvement overnight, with transient upper abdominal pain now improved. Mild tachycardia and leukocytosis of uncertain cause. Overall, I think it is quite possible that this patient did have some mild post-ERCP pancreatitis. Her overall course seems to be improving.  Plan:  1. Continue to monitor labs and clinical status. The patient does not feel ready to advance her diet, and I would feel better keeping her on clear liquids until we see further clinical and biochemical improvement. 2. Surgical consultation for cholecystectomy, possibly on this admission.  Florencia Reasons, M.D. [phone]

## 2012-11-16 NOTE — Consult Note (Signed)
ATTENDING ADDENDUM:  I personally reviewed patient's record, examined the patient, and formulated the following assessment and plan:  Pt s/p sphincterotomy and stone removal.  Developed post ERCP pancreatitis.  Lipase resolving.  Will recheck in AM and plan on OR as long as lipase continuing to go down.     

## 2012-11-16 NOTE — Consult Note (Signed)
[name] 02/10/1932  [id number].   Requesting MD: Dr. Darnelle Catalan Chief Complaint/Reason for Consult: cholelithiasis HPI: This is a 79yo female with multiple medical problems who for the last 7 weeks has been having epigastric abdominal pain that she thought was secondary to her hiatal hernia.  She has been having worsening reflux symptoms along with esophageal spasms during this time frame as well.  She has been seeing Dr. Ewing Schlein and was noted to have choledocholithiasis.  She underwent an ERCP with stone extraction and sphincterotomy.  She was doing well, until the follow day when she developed severe LUQ abdominal pain.  She came to [facility] where she was noted to have pancreatitis, likely post ERCP related.  She has been admitted for further treatment.  Her lipase was initially 2768 yesterday, but has come down to 166 today.  We have been asked to evaluate her for cholecystectomy.  ROS: Please see HPI, otherwise all other systems have been reviewed and are negative.  Family History  Problem Relation Age of Onset  . Heart disease Mother     Enlarged heart  . Arrhythmia Mother   . Diabetes Mother   . Stroke Sister   . Heart disease Brother     MI at unknown age  . Anesthesia problems Brother   . Heart attack      MI at age 96  . Heart disease Brother     CABG    Past Medical History  Diagnosis Date  . Hypertension   . Hypothyroid   . Goiter   . GERD (gastroesophageal reflux disease)   . Fibromyalgia   . Heart murmur   . Peripheral vascular disease     varicose veins in left arm   . Shortness of breath     with exertion   . Anemia     hx of in 2003   . Hiatal hernia   . Headache(784.0)     tension headache daily per pt   . Arthritis   . Depression   . DJD (degenerative joint disease)   . Osteoarthritis   . Colon cancer     s/p resecton of colon in 2003 , hx of chemo  . Headaches, cluster   . Abnormal uterine bleeding (AUB)     on cont HRT  . Colon cancer   . TMJ  (dislocation of temporomandibular joint)   . Compression fracture jan 2012    due to fall  . PONV (postoperative nausea and vomiting)   . Choledocholithiasis with obstruction 11/16/2012  . Pancreatitis, acute 11/15/2012  . Calcification of aorta 11/16/2012  . Macular degeneration     Past Surgical History  Procedure Laterality Date  . Tmj arthroplasty  1990's  . Appendectomy    . Colon resection  2003  . Eye surgery      bilateral cataract surgery   . Total knee arthroplasty  12/20/2010    Procedure: TOTAL KNEE ARTHROPLASTY;  Surgeon: Shelda Pal;  Location: [facility];  Service: Orthopedics;  Laterality: Right;  . Colonoscopy w/ biopsies  09/25/2002, 09/18/2001  . Upper gastrointestinal endoscopy  09/18/2001  . Porta catheter placement  10/30/2001  . Porta catheter removal  06/27/2003  . Tmj arthroplasty    . Dilation and curettage of uterus    . Hysteroscopy    . Esophagogastroduodenoscopy endoscopy    . Vertebroplasty  2012  . Back surgery  2000    lower  . Tonsillectomy  age 62 or 8  . Joint  replacement Right dec 2012    knee  . Ercp N/A 11/14/2012    Procedure: ENDOSCOPIC RETROGRADE CHOLANGIOPANCREATOGRAPHY (ERCP);  Surgeon: Petra Kuba, MD;  Location: [facility];  Service: Endoscopy;  Laterality: N/A;    Social History:  reports that she has never smoked. She has never used smokeless tobacco. She reports that she does not drink alcohol or use illicit drugs.  Allergies:  Allergies  Allergen Reactions  . Tape Rash    Blisters, nausea, and vomitting  . Latex Other (See Comments)    blister  . Lidocaine Nausea And Vomiting  . Sulfur Nausea And Vomiting    Medications Prior to Admission  Medication Sig Dispense Refill  . acetaminophen (TYLENOL) 500 MG tablet Take 500 mg by mouth every 6 (six) hours as needed.       Marland Kitchen amLODipine (NORVASC) 5 MG tablet Take 5 mg by mouth every morning.       Marland Kitchen aspirin 325 MG tablet Take 325 mg by mouth at bedtime.      . Calcium-Vitamin  D (CALTRATE 600 PLUS-VIT D PO) Take 1 tablet by mouth 2 (two) times daily.       Marland Kitchen docusate sodium (COLACE) 100 MG capsule Take 100 mg by mouth 3 (three) times daily as needed for constipation.      Marland Kitchen estradiol (ESTRACE) 0.5 MG tablet Take 0.5 tablets (0.25 mg total) by mouth daily.  90 tablet  3  . levothyroxine (SYNTHROID) 125 MCG tablet Take 125 mcg by mouth See admin instructions. Monday thru Thursday      . levothyroxine (SYNTHROID) 137 MCG tablet Take 137 mcg by mouth See admin instructions. Friday thru Sunday      . medroxyPROGESTERone (PROVERA) 5 MG tablet Take 0.5 tablets (2.5 mg total) by mouth daily. 2.5 mg tab daily  90 tablet  3  . Multiple Vitamins-Minerals (MULTI COMPLETE PO) Take by mouth daily.       . niacin (NIASPAN) 1000 MG CR tablet Take 1,000 mg by mouth at bedtime.       . nortriptyline (PAMELOR) 25 MG capsule Take 75 mg by mouth every morning.       Marland Kitchen omeprazole (PRILOSEC) 40 MG capsule Take 40 mg by mouth 2 (two) times daily.      . simvastatin (ZOCOR) 20 MG tablet Take 20 mg by mouth at bedtime.       . Teriparatide, Recombinant, (FORTEO Dinosaur) Inject 20 mcg into the skin daily.       . vitamin B-12 (CYANOCOBALAMIN) 1000 MCG tablet Take 1,000 mcg by mouth daily.      . vitamin C (ASCORBIC ACID) 500 MG tablet Take 500 mg by mouth daily.       . Vitamin D, Ergocalciferol, (DRISDOL) 50000 UNITS CAPS capsule Take 1 capsule (50,000 Units total) by mouth every 14 (fourteen) days.  26 capsule  1  . vitamin E 400 UNIT capsule Take 400 Units by mouth 2 (two) times daily.       Marland Kitchen zolpidem (AMBIEN) 10 MG tablet Take 10 mg by mouth at bedtime.      . [DISCONTINUED] Multiple Vitamins-Minerals (PRESERVISION AREDS PO) Take 1 tablet by mouth 2 (two) times daily.        Blood pressure 149/83, pulse 110, temperature 98.8 F (37.1 C), temperature source Oral, resp. rate 20, height 5\' 7"  (1.702 m), weight 137 lb (62.143 kg), SpO2 96.00%. Physical Exam: General: pleasant, WD, WN white female  who is laying in bed in  NAD HEENT: head is normocephalic, atraumatic.  Sclera are noninjected.  PERRL.  Ears and nose without any masses or lesions.  Mouth is pink and moist Heart: regular, rate, and rhythm.  Normal s1,s2. No obvious gallops or rubs noted. Murmur noted  Palpable radial and pedal pulses bilaterally Lungs: CTAB, no wheezes, rhonchi, or rales noted.  Respiratory effort nonlabored Abd: soft, mild tenderness in RUQ, epigastrium, and LUQ, ND, +BS, no masses, hernias, or organomegaly.  No guarding or rebounding.  Small transverse RMQ scar noted from her right colectomy in 2003 MS: all 4 extremities are symmetrical with no cyanosis, clubbing, or edema. Skin: warm and dry with no masses, lesions, or rashes Psych: A&Ox3 with an appropriate affect.    Results for orders placed during the hospital encounter of 11/15/12 (from the past 48 hour(s))  CBC WITH DIFFERENTIAL     Status: Abnormal   Collection Time    11/15/12  1:13 PM      Result Value Range   WBC 11.9 (*) 4.0 - 10.5 K/uL   RBC 4.01  3.87 - 5.11 MIL/uL   Hemoglobin 13.8  12.0 - 15.0 g/dL   HCT 16.1  09.6 - 04.5 %   MCV 100.2 (*) 78.0 - 100.0 fL   MCH 34.4 (*) 26.0 - 34.0 pg   MCHC 34.3  30.0 - 36.0 g/dL   RDW 40.9  81.1 - 91.4 %   Platelets 274  150 - 400 K/uL   Neutrophils Relative % 80 (*) 43 - 77 %   Neutro Abs 9.6 (*) 1.7 - 7.7 K/uL   Lymphocytes Relative 9 (*) 12 - 46 %   Lymphs Abs 1.1  0.7 - 4.0 K/uL   Monocytes Relative 10  3 - 12 %   Monocytes Absolute 1.2 (*) 0.1 - 1.0 K/uL   Eosinophils Relative 0  0 - 5 %   Eosinophils Absolute 0.0  0.0 - 0.7 K/uL   Basophils Relative 0  0 - 1 %   Basophils Absolute 0.0  0.0 - 0.1 K/uL  LIPASE, BLOOD     Status: Abnormal   Collection Time    11/15/12  1:13 PM      Result Value Range   Lipase 2768 (*) 11 - 59 U/L  COMPREHENSIVE METABOLIC PANEL     Status: Abnormal   Collection Time    11/15/12  1:13 PM      Result Value Range   Sodium 133 (*) 135 - 145 mEq/L    Potassium 3.4 (*) 3.5 - 5.1 mEq/L   Chloride 95 (*) 96 - 112 mEq/L   CO2 26  19 - 32 mEq/L   Glucose, Bld 155 (*) 70 - 99 mg/dL   BUN 12  6 - 23 mg/dL   Creatinine, Ser 7.82  0.50 - 1.10 mg/dL   Calcium 95.6  8.4 - 21.3 mg/dL   Total Protein 8.2  6.0 - 8.3 g/dL   Albumin 3.8  3.5 - 5.2 g/dL   AST 086 (*) 0 - 37 U/L   ALT 364 (*) 0 - 35 U/L   Alkaline Phosphatase 318 (*) 39 - 117 U/L   Total Bilirubin 1.3 (*) 0.3 - 1.2 mg/dL   GFR calc non Af Amer 80 (*) >90 mL/min   GFR calc Af Amer >90  >90 mL/min   Comment: (NOTE)     The eGFR has been calculated using the CKD EPI equation.     This calculation has not been validated in  all clinical situations.     eGFR's persistently <90 mL/min signify possible Chronic Kidney     Disease.  TSH     Status: Abnormal   Collection Time    11/15/12  1:18 PM      Result Value Range   TSH 5.297 (*) 0.350 - 4.500 uIU/mL   Comment: Performed at [facility]  COMPREHENSIVE METABOLIC PANEL     Status: Abnormal   Collection Time    11/16/12  4:20 AM      Result Value Range   Sodium 133 (*) 135 - 145 mEq/L   Potassium 3.6  3.5 - 5.1 mEq/L   Chloride 97  96 - 112 mEq/L   CO2 23  19 - 32 mEq/L   Glucose, Bld 149 (*) 70 - 99 mg/dL   BUN 8  6 - 23 mg/dL   Creatinine, Ser 1.30  0.50 - 1.10 mg/dL   Calcium 9.6  8.4 - 86.5 mg/dL   Total Protein 7.7  6.0 - 8.3 g/dL   Albumin 3.6  3.5 - 5.2 g/dL   AST 784 (*) 0 - 37 U/L   ALT 255 (*) 0 - 35 U/L   Alkaline Phosphatase 290 (*) 39 - 117 U/L   Total Bilirubin 0.9  0.3 - 1.2 mg/dL   GFR calc non Af Amer 85 (*) >90 mL/min   GFR calc Af Amer >90  >90 mL/min   Comment: (NOTE)     The eGFR has been calculated using the CKD EPI equation.     This calculation has not been validated in all clinical situations.     eGFR's persistently <90 mL/min signify possible Chronic Kidney     Disease.  LIPASE, BLOOD     Status: Abnormal   Collection Time    11/16/12  4:20 AM      Result Value Range   Lipase 166  (*) 11 - 59 U/L  CBC WITH DIFFERENTIAL     Status: Abnormal   Collection Time    11/16/12  4:20 AM      Result Value Range   WBC 12.8 (*) 4.0 - 10.5 K/uL   RBC 3.90  3.87 - 5.11 MIL/uL   Hemoglobin 13.5  12.0 - 15.0 g/dL   HCT 69.6  29.5 - 28.4 %   MCV 100.0  78.0 - 100.0 fL   MCH 34.6 (*) 26.0 - 34.0 pg   MCHC 34.6  30.0 - 36.0 g/dL   RDW 13.2  44.0 - 10.2 %   Platelets 256  150 - 400 K/uL   Neutrophils Relative % 81 (*) 43 - 77 %   Neutro Abs 10.4 (*) 1.7 - 7.7 K/uL   Lymphocytes Relative 9 (*) 12 - 46 %   Lymphs Abs 1.1  0.7 - 4.0 K/uL   Monocytes Relative 10  3 - 12 %   Monocytes Absolute 1.3 (*) 0.1 - 1.0 K/uL   Eosinophils Relative 0  0 - 5 %   Eosinophils Absolute 0.0  0.0 - 0.7 K/uL   Basophils Relative 0  0 - 1 %   Basophils Absolute 0.0  0.0 - 0.1 K/uL  T4, FREE     Status: Abnormal   Collection Time    11/16/12  9:10 AM      Result Value Range   Free T4 1.92 (*) 0.80 - 1.80 ng/dL   Comment: Performed at [facility]   Ct Abdomen Pelvis W Contrast  11/15/2012  CLINICAL DATA:  Mid abdominal pain. Nausea, vomiting.  EXAM: CT ABDOMEN AND PELVIS WITH CONTRAST  TECHNIQUE: Multidetector CT imaging of the abdomen and pelvis was performed using the standard protocol following bolus administration of intravenous contrast.  CONTRAST:  OMNIPAQUE IOHEXOL 300 MG/ML  SOLN  COMPARISON:  06/29/2011  FINDINGS: Lung bases are clear. No effusions. Heart is normal size.  Small hiatal hernia. Contrast material is noted within the esophagus which may reflect dysmotility or reflux.  New layering high-density material partially fills the gallbladder, presumably vicarious excretion of contrast. This obscures the previously seen small layering gallstones. Liver, spleen, adrenals are unremarkable. Pancreas is atrophic without focal abnormality. No biliary ductal dilatation. Small cyst in the midpole of the left kidney. Right kidney is unremarkable. No hydronephrosis.  Postsurgical  changes are noted in the right lower quadrant. Again noted are calcifications and abnormal soft tissue adjacent to the anastomotic site. This is unchanged since prior study. Extensive sigmoid diverticulosis. No active diverticulitis. Small bowel is decompressed. Stomach is grossly unremarkable.  Uterus, adnexa and urinary bladder are unremarkable. No free fluid, free air or adenopathy. Aorta is calcified, non aneurysmal.  No acute bony abnormality.  IMPRESSION: Postsurgical changes in the right lower quadrant bowel. Adjacent partially calcified soft tissue is again noted and not significantly changed, presumably postsurgical reaction.  High density material partially fills the gallbladder, presumably vicarious excretion of contrast which obscures the layering stones seen previously.  No acute findings in the abdomen or pelvis.  Sigmoid diverticulosis.   Electronically Signed   By: Charlett Nose M.D.   On: 11/15/2012 16:17   Dg Ercp With Sphincterotomy  11/14/2012   CLINICAL DATA:  Gallstones.  EXAM: ERCP  TECHNIQUE: Multiple spot images obtained with the fluoroscopic device and submitted for interpretation post-procedure.  COMPARISON:  Ultrasound 11/02/2012  FINDINGS: The common bile duct was cannulated and opacified with contrast. There may be partial filling of the gallbladder and cystic duct. Balloon was inflated within the common bile duct for stone removal. No definite filling defects within the common bile duct.  IMPRESSION: Opacification of the common bile duct. No definite stones were identified on these images.  These images were submitted for radiologic interpretation only. Please see the procedural report for the amount of contrast and the fluoroscopy time utilized.   Electronically Signed   By: Richarda Overlie M.D.   On: 11/14/2012 16:58   Dg Abd Acute W/chest  11/15/2012   CLINICAL DATA:  Epigastric pain status post ERCP.  EXAM: ACUTE ABDOMEN SERIES (ABDOMEN 2 VIEW & CHEST 1 VIEW)  COMPARISON:  CHEST  x-ray 09/14/2010. CT of the abdomen and pelvis 06/29/2011.  FINDINGS: Lung volumes are normal. No consolidative airspace disease. No pleural effusions. No pneumothorax. No pulmonary nodule or mass noted. Pulmonary vasculature and the cardiomediastinal silhouette are within normal limits. Atherosclerosis in the thoracic aorta.  Small amount of contrast material is noted throughout the colon. Gas and stool are seen scattered throughout the colon extending to the level of the distal rectum. No pathologic distension of small bowel is noted. No gross evidence of pneumoperitoneum.  IMPRESSION: 1. Nonobstructive bowel gas pattern. 2. No pneumoperitoneum. 3. No radiographic evidence of acute cardiopulmonary disease. 4. Atherosclerosis.   Electronically Signed   By: Trudie Reed M.D.   On: 11/15/2012 13:54       Assessment/Plan 1. Post ERCP pancreatitis 2. S/p ERCP secondary to choledocholithiasis 3. Cholelithiasis Patient Active Problem List   Diagnosis Date Noted  . Hypokalemia 11/16/2012  .  Hyponatremia 11/16/2012  . GERD (gastroesophageal reflux disease) 11/16/2012  . Choledocholithiasis with obstruction 11/16/2012  . Calcification of aorta 11/16/2012  . Atherosclerosis 11/16/2012  . Hyperglycemia 11/16/2012  . Pancreatitis, acute 11/15/2012  . Hypothyroidism 11/15/2012  . Nonspecific (abnormal) findings on radiological and other examination of gastrointestinal tract 11/08/2012  . S/P right knee replacement 12/22/2010  . Preop cardiovascular exam 12/08/2010  . Hypertension 12/08/2010  . Hyperlipidemia 12/08/2010   Plan: 1. If the patient's pancreatitis continues to improve and her labs improve, we will plan on cholecystectomy possibly as soon as tomorrow.  She can have clears today as long as they don't make her pain worse.  NPO p MN for possible surgery tomorrow. 2. Recheck labs in am  Laylah Riga E 11/16/2012, 2:19 PM Pager: [phone]

## 2012-11-16 NOTE — Progress Notes (Signed)
INITIAL NUTRITION ASSESSMENT  Pt meets criteria for severe MALNUTRITION in the context of chronic illness as evidenced by <75% estimated energy intake with 9.8% weight loss in the past 2 months per pt report in addition to pt with severe muscle wasting and subcutaneous fat loss in orbital/temporal region, clavicles, and hands.  DOCUMENTATION CODES Per approved criteria  -Severe malnutrition in the context of chronic illness   INTERVENTION: - Diet advancement per MD - Resource Breeze BID diluted with ginger-ale - Will continue to monitor   NUTRITION DIAGNOSIS: Altered GI function related to persistent nausea as evidenced by pt report.    Goal: 1. Resolution of nausea 2. Advance diet as tolerated to bland diet  Monitor:  Weights, labs, nausea, diet advancement  Reason for Assessment: Nutrition risk   79 y.o. female  Admitting Dx: Pancreatitis, acute  ASSESSMENT: Pt discussed during multidisciplinary rounds. Pt with hx of HTN, HPL, hypothyroidism, h/o colon CA, GERD, hiatal hernia, cholelelithiasis who underwent ERCP with sphincterotomy on 10/2912 presented with epigastric abdominal pain associated with nausea/vomiting found to have acute pancreatitis. Being seen by GI who recommended cholecystectomy in the future.   Pt admitted with abdominal pain x 7 weeks during which time pt reports she lost 15 pounds unintentionally. Reports this has been due to eat less r/t persistent nausea, no vomiting. States she would eat eggs/grits/cereal for breakfast and a meat/vegetable for dinner. States she was able to eat half of her clear liquid breakfast tray this morning but still just the sight of food makes her nauseated. Pt with visible severe muscle wasting and subcutaneous fat loss in orbital/temporal region, clavicles, and hands.   Sodium low, Alk phos, lipase, and AST/ALT elevated.    Height: Ht Readings from Last 1 Encounters:  11/15/12 5\' 7"  (1.702 m)    Weight: Wt Readings from  Last 1 Encounters:  11/15/12 137 lb (62.143 kg)    Ideal Body Weight: 135 lb   % Ideal Body Weight: 101%  Wt Readings from Last 10 Encounters:  11/15/12 137 lb (62.143 kg)  11/14/12 142 lb (64.411 kg)  11/14/12 142 lb (64.411 kg)  07/26/12 144 lb 6.4 oz (65.499 kg)  07/02/12 147 lb 6.4 oz (66.86 kg)  06/30/11 144 lb 12.8 oz (65.681 kg)  03/10/11 141 lb (63.957 kg)  02/23/11 141 lb (63.957 kg)  02/24/10 152 lb 14.4 oz (69.355 kg)  12/24/10 150 lb (68.04 kg)    Usual Body Weight: 152 lb per pt  % Usual Body Weight: 90%  BMI:  Body mass index is 21.45 kg/(m^2).  Estimated Nutritional Needs: Kcal: 1550-1750 Protein: 65-75g Fluid: 1.5-1.7L/day  Skin: Intact   Diet Order: Clear Liquid  EDUCATION NEEDS: -No education needs identified at this time   Intake/Output Summary (Last 24 hours) at 11/16/12 1240 Last data filed at 11/16/12 0900  Gross per 24 hour  Intake 1866.25 ml  Output   2600 ml  Net -733.75 ml    Last BM: 10/28  Labs:   Recent Labs Lab 11/15/12 1313 11/16/12 0420  NA 133* 133*  K 3.4* 3.6  CL 95* 97  CO2 26 23  BUN 12 8  CREATININE 0.75 0.61  CALCIUM 10.4 9.6  GLUCOSE 155* 149*    CBG (last 3)  No results found for this basename: GLUCAP,  in the last 72 hours  Scheduled Meds: . amLODipine  5 mg Oral Daily  . calcium citrate-vitamin D  1 tablet Oral Daily  . enoxaparin (LOVENOX) injection  40 mg  Subcutaneous Q24H  . estradiol  0.25 mg Oral Daily  . [START ON 11/19/2012] levothyroxine  125 mcg Oral Custom   And  . levothyroxine  137 mcg Oral Custom  . medroxyPROGESTERone  2.5 mg Oral Daily  . multivitamin  1 tablet Oral Daily  . nortriptyline  75 mg Oral q morning - 10a  . pantoprazole  40 mg Oral BID AC  . simvastatin  20 mg Oral QHS  . Teriparatide (Recombinant)  20 mcg Subcutaneous Daily  . vitamin B-12  1,000 mcg Oral Daily  . vitamin C  500 mg Oral Daily  . [START ON 11/25/2012] Vitamin D (Ergocalciferol)  50,000 Units Oral  Q14 Days  . vitamin E  400 Units Oral BID  . zolpidem  5 mg Oral QHS    Continuous Infusions: . 0.9 % NaCl with KCl 20 mEq / L 50 mL/hr at 11/16/12 9562    Past Medical History  Diagnosis Date  . Hypertension   . Hypothyroid   . Goiter   . GERD (gastroesophageal reflux disease)   . Fibromyalgia   . Heart murmur   . Peripheral vascular disease     varicose veins in left arm   . Shortness of breath     with exertion   . Anemia     hx of in 2003   . Hiatal hernia   . Headache(784.0)     tension headache daily per pt   . Arthritis   . Depression   . DJD (degenerative joint disease)   . Osteoarthritis   . Colon cancer     s/p resecton of colon in 2003 , hx of chemo  . Headaches, cluster   . Abnormal uterine bleeding (AUB)     on cont HRT  . Colon cancer   . TMJ (dislocation of temporomandibular joint)   . Compression fracture jan 2012    due to fall  . PONV (postoperative nausea and vomiting)   . Choledocholithiasis with obstruction 11/16/2012  . Pancreatitis, acute 11/15/2012  . Calcification of aorta 11/16/2012  . Macular degeneration     Past Surgical History  Procedure Laterality Date  . Tmj arthroplasty  1990's  . Appendectomy    . Colon resection    . Eye surgery      bilateral cataract surgery   . Total knee arthroplasty  12/20/2010    Procedure: TOTAL KNEE ARTHROPLASTY;  Surgeon: Shelda Pal;  Location: [facility];  Service: Orthopedics;  Laterality: Right;  . Colonoscopy w/ biopsies  09/25/2002, 09/18/2001  . Upper gastrointestinal endoscopy  09/18/2001  . Porta catheter placement  10/30/2001  . Porta catheter removal  06/27/2003  . Tmj arthroplasty    . Dilation and curettage of uterus    . Hysteroscopy    . Esophagogastroduodenoscopy endoscopy    . Vertebroplasty  2012  . Back surgery  2000    lower  . Tonsillectomy  age 89 or 8  . Colon surgery  2003  . Joint replacement Right dec 2012    knee  . Ercp N/A 11/14/2012    Procedure: ENDOSCOPIC  RETROGRADE CHOLANGIOPANCREATOGRAPHY (ERCP);  Surgeon: Petra Kuba, MD;  Location: [facility];  Service: Endoscopy;  Laterality: N/A;    Levon Hedger MS, RD, LDN [phone] Pager [phone] After Hours Pager

## 2012-11-16 NOTE — Progress Notes (Signed)
[facility] PROGRESS NOTE  [name] ZOX:[id number] DOB: March 15, 1932 DOA: 11/15/2012 PCP: [name], MD  Brief narrative: [name] is an 79 y.o. female with a PMH of hypertension, hyperlipidemia, hypothyroidism, history of colon cancer, GERD, hiatal hernia, cholelithiasis, presumed CBD stones status post ERCP 11/14/2012 who was admitted post procedure for treatment of mild pancreatitis.  Assessment/Plan: Principal Problem:   Pancreatitis, acute,  status post ERCP with sphincterotomy / choledocholelithiasis with obstruction -Admitted and placed on bowel rest, IV fluids, anti-emetics, medication for pain control. -GI following. -LFTs trending down status post sphincterotomy. Lipase significantly improved. -Continue clear liquid diet for now. -Surgical consultation for cholecystectomy. Active Problems:   Calcification of aorta / atherosclerosis -Found incidentally on imaging. Recommend risk factor modification.  On daily ASA (held).   Hyperglycemia -Fasting glucoses greater than 126x2. Check hemoglobin A1c.   Hypertension -Continue Norvasc.   Hypothyroidism -Continue Synthroid. TSH mildly elevated 5.297. Check free T4.   Hypokalemia -Corrected with supplementation.   Hyponatremia -Mild, stable. Continue IV fluids.   GERD -Continue Protonix.   Hyperlipidemia -Continue Niaspan and Zocor.  Depression / Fibromyalgia -Continue Pamelor.  Code Status: Full. Family Communication: No family at the bedside. Disposition Plan: Home when stable.   Medical Consultants:  Dr. Bernette Redbird, Gastroenterology  Other Consultants:  None.  Anti-infectives:  None.  HPI/Subjective: [name] had severe abdominal pain last night, but it has eased off this morning although she still has some midepigastric discomfort. Tolerating sips of clear liquids without nausea or vomiting. No bowel movement for 3 days, and states that her stools have been  yellow.  Objective: Filed Vitals:   11/15/12 1638 11/15/12 2045 11/16/12 0312 11/16/12 0605  BP: 177/86 172/82 168/90 149/83  Pulse: 93 92  110  Temp: 98.1 F (36.7 C) 97.3 F (36.3 C)  98.8 F (37.1 C)  TempSrc: Axillary Oral  Oral  Resp: 18 18  20   Height: 5\' 7"  (1.702 m)     Weight: 62.143 kg (137 lb)     SpO2: 98% 98%  96%    Intake/Output Summary (Last 24 hours) at 11/16/12 0742 Last data filed at 11/16/12 0236  Gross per 24 hour  Intake 506.25 ml  Output   2325 ml  Net -1818.75 ml    Exam: Gen:  NAD Cardiovascular:  Tachycardic, No M/R/G Respiratory:  Lungs CTAB Gastrointestinal:  Abdomen soft, slightly tender right upper quadrant, no guarding or rebound Extremities:  No C/E/C  Data Reviewed: Basic Metabolic Panel:  Recent Labs Lab 11/15/12 1313 11/16/12 0420  NA 133* 133*  K 3.4* 3.6  CL 95* 97  CO2 26 23  GLUCOSE 155* 149*  BUN 12 8  CREATININE 0.75 0.61  CALCIUM 10.4 9.6   GFR Estimated Creatinine Clearance: 57.3 ml/min (by C-G formula based on Cr of 0.61). Liver Function Tests:  Recent Labs Lab 11/15/12 1313 11/16/12 0420  AST 423* 196*  ALT 364* 255*  ALKPHOS 318* 290*  BILITOT 1.3* 0.9  PROT 8.2 7.7  ALBUMIN 3.8 3.6    Recent Labs Lab 11/15/12 1313 11/16/12 0420  LIPASE 2768* 166*   CBC:  Recent Labs Lab 11/15/12 1313 11/16/12 0420  WBC 11.9* 12.8*  NEUTROABS 9.6* 10.4*  HGB 13.8 13.5  HCT 40.2 39.0  MCV 100.2* 100.0  PLT 274 256   Thyroid function studies  Recent Labs  11/15/12 1318  TSH 5.297*    Procedures and Diagnostic Studies: Ct Abdomen Pelvis W Contrast  11/15/2012   CLINICAL  DATA:  Mid abdominal pain. Nausea, vomiting.  EXAM: CT ABDOMEN AND PELVIS WITH CONTRAST  TECHNIQUE: Multidetector CT imaging of the abdomen and pelvis was performed using the standard protocol following bolus administration of intravenous contrast.  CONTRAST:  OMNIPAQUE IOHEXOL 300 MG/ML  SOLN  COMPARISON:  06/29/2011   FINDINGS: Lung bases are clear. No effusions. Heart is normal size.  Small hiatal hernia. Contrast material is noted within the esophagus which may reflect dysmotility or reflux.  New layering high-density material partially fills the gallbladder, presumably vicarious excretion of contrast. This obscures the previously seen small layering gallstones. Liver, spleen, adrenals are unremarkable. Pancreas is atrophic without focal abnormality. No biliary ductal dilatation. Small cyst in the midpole of the left kidney. Right kidney is unremarkable. No hydronephrosis.  Postsurgical changes are noted in the right lower quadrant. Again noted are calcifications and abnormal soft tissue adjacent to the anastomotic site. This is unchanged since prior study. Extensive sigmoid diverticulosis. No active diverticulitis. Small bowel is decompressed. Stomach is grossly unremarkable.  Uterus, adnexa and urinary bladder are unremarkable. No free fluid, free air or adenopathy. Aorta is calcified, non aneurysmal.  No acute bony abnormality.  IMPRESSION: Postsurgical changes in the right lower quadrant bowel. Adjacent partially calcified soft tissue is again noted and not significantly changed, presumably postsurgical reaction.  High density material partially fills the gallbladder, presumably vicarious excretion of contrast which obscures the layering stones seen previously.  No acute findings in the abdomen or pelvis.  Sigmoid diverticulosis.   Electronically Signed   By: Charlett Nose M.D.   On: 11/15/2012 16:17   Dg Ercp With Sphincterotomy  11/14/2012   CLINICAL DATA:  Gallstones.  EXAM: ERCP  TECHNIQUE: Multiple spot images obtained with the fluoroscopic device and submitted for interpretation post-procedure.  COMPARISON:  Ultrasound 11/02/2012  FINDINGS: The common bile duct was cannulated and opacified with contrast. There may be partial filling of the gallbladder and cystic duct. Balloon was inflated within the common bile  duct for stone removal. No definite filling defects within the common bile duct.  IMPRESSION: Opacification of the common bile duct. No definite stones were identified on these images.  These images were submitted for radiologic interpretation only. Please see the procedural report for the amount of contrast and the fluoroscopy time utilized.   Electronically Signed   By: Richarda Overlie M.D.   On: 11/14/2012 16:58   Dg Abd Acute W/chest  11/15/2012   CLINICAL DATA:  Epigastric pain status post ERCP.  EXAM: ACUTE ABDOMEN SERIES (ABDOMEN 2 VIEW & CHEST 1 VIEW)  COMPARISON:  CHEST x-ray 09/14/2010. CT of the abdomen and pelvis 06/29/2011.  FINDINGS: Lung volumes are normal. No consolidative airspace disease. No pleural effusions. No pneumothorax. No pulmonary nodule or mass noted. Pulmonary vasculature and the cardiomediastinal silhouette are within normal limits. Atherosclerosis in the thoracic aorta.  Small amount of contrast material is noted throughout the colon. Gas and stool are seen scattered throughout the colon extending to the level of the distal rectum. No pathologic distension of small bowel is noted. No gross evidence of pneumoperitoneum.  IMPRESSION: 1. Nonobstructive bowel gas pattern. 2. No pneumoperitoneum. 3. No radiographic evidence of acute cardiopulmonary disease. 4. Atherosclerosis.   Electronically Signed   By: Trudie Reed M.D.   On: 11/15/2012 13:54    Scheduled Meds: . amLODipine  5 mg Oral Daily  . aspirin  325 mg Oral QHS  . enoxaparin (LOVENOX) injection  40 mg Subcutaneous Q24H  . [START  ON 11/19/2012] levothyroxine  125 mcg Oral Custom   And  . levothyroxine  137 mcg Oral Custom  . medroxyPROGESTERone  2.5 mg Oral Daily  . niacin  1,000 mg Oral QHS  . nortriptyline  75 mg Oral q morning - 10a  . pantoprazole  40 mg Oral Daily  . simvastatin  20 mg Oral QHS  . Teriparatide (Recombinant)  20 mcg Subcutaneous Daily  . [START ON 11/25/2012] Vitamin D (Ergocalciferol)  50,000  Units Oral Q14 Days  . zolpidem  5 mg Oral QHS   Continuous Infusions: . sodium chloride 100 mL/hr at 11/15/12 1545  . 0.9 % NaCl with KCl 20 mEq / L 125 mL/hr at 11/16/12 0236    Time spent: 35 minutes with > 50% of time discussing current diagnostic test results, clinical impression and plan of care.    LOS: 1 day   [name]  [facility] Pager [phone].   *Please note that the hospitalists switch teams on Wednesdays. Please call the flow manager at [phone] if you are having difficulty reaching the hospitalist taking care of this patient as she can update you and provide the most up-to-date pager number of provider caring for the patient. If 8PM-8AM, please contact night-coverage at [email or web address], password [email or web address]  11/16/2012, 7:42 AM     Information printed out, explained, and given to the patient:  In an effort to keep you and your family informed about your hospital stay, I am providing you with this information sheet. If you or your family have any questions, please do not hesitate to have the nursing staff page me to set up a meeting time.  [name] 11/16/2012 1 (Number of days in the hospital)  Treatment team:  Dr. Hillery Aldo, Hospitalist (Internist)  Dr. Vida Rigger, Gastroenterologist  Active Treatment Issues with Plan: Principal Problem:   Pancreatitis after ERCP with sphincterotomy -Admitted and placed on bowel rest, IV fluids, antinausea medicine, medication for pain control. -Your labs look much better today, so we will probably start to advance your diet slowly if yiour pain is better. Active Problems:   Atherosclerosis (hardening of the arteries) -Found incidentally on imaging. Recommend risk factor modification (control blood pressure and cholesterol).   High blood sugar -Fasting glucoses greater than 126x2. Check a blood test to see if you might be at risk for underlying diabetes.   High blood pressure -Continue Norvasc.    Under active thyroid gland -Continue Synthroid. TSH mildly elevated 5.297 (can mean that your Synthroid dose is too low). Check free hormone level)   Low potassium -Corrected with supplementation.   Reflux -Continue Protonix, have increased your dose back to twice a day.   Hyperlipidemia -Continue Niaspan and Zocor.  Fibromyalgia -Continue Pamelor. Significant Lab results:  Recent Labs Lab 11/15/12 1313 11/16/12 0420  LIPASE 2768* 166*   Lab 11/15/12 1313 11/16/12 0420  AST 423* 196*  ALT 364* 255*  ALKPHOS 318* 290*  BILITOT 1.3* 0.9   Anticipated discharge date: 1-2 days depending on progress, tolerance of diet advancement.

## 2012-11-17 ENCOUNTER — Encounter (HOSPITAL_COMMUNITY)
Admission: EM | Disposition: A | Discharge status: Home / Self Care | Payer: Self-pay | Source: Home / Self Care | Attending: Internal Medicine

## 2012-11-17 ENCOUNTER — Encounter (HOSPITAL_COMMUNITY): Payer: Self-pay | Admitting: Internal Medicine

## 2012-11-17 ENCOUNTER — Inpatient Hospital Stay (HOSPITAL_COMMUNITY): Payer: Medicare Other | Admitting: Anesthesiology

## 2012-11-17 ENCOUNTER — Encounter (HOSPITAL_COMMUNITY): Payer: Medicare Other | Admitting: Anesthesiology

## 2012-11-17 DIAGNOSIS — E43 Unspecified severe protein-calorie malnutrition: Secondary | ICD-10-CM | POA: Diagnosis present

## 2012-11-17 DIAGNOSIS — K8010 Calculus of gallbladder with chronic cholecystitis without obstruction: Secondary | ICD-10-CM

## 2012-11-17 HISTORY — PX: CHOLECYSTECTOMY: SHX55

## 2012-11-17 LAB — COMPREHENSIVE METABOLIC PANEL
Albumin: 3.5 g/dL (ref 3.5–5.2)
Alkaline Phosphatase: 263 U/L — ABNORMAL HIGH (ref 39–117)
BUN: 7 mg/dL (ref 6–23)
CO2: 23 mEq/L (ref 19–32)
Calcium: 9.8 mg/dL (ref 8.4–10.5)
Chloride: 95 mEq/L — ABNORMAL LOW (ref 96–112)
Creatinine, Ser: 0.61 mg/dL (ref 0.50–1.10)
GFR calc Af Amer: >90 mL/min (ref >90)
GFR calc non Af Amer: 85 mL/min — ABNORMAL LOW (ref >90)
Glucose, Bld: 153 mg/dL — ABNORMAL HIGH (ref 70–99)
Potassium: 3.7 mEq/L (ref 3.5–5.1)
Total Protein: 7.7 g/dL (ref 6.0–8.3)

## 2012-11-17 LAB — CBC WITH DIFFERENTIAL/PLATELET
Basophils Relative: 0 % (ref 0–1)
Eosinophils Absolute: 0 10*3/uL (ref 0.0–0.7)
HCT: 36.9 % (ref 36.0–46.0)
Hemoglobin: 12.6 g/dL (ref 12.0–15.0)
Lymphs Abs: 1 10*3/uL (ref 0.7–4.0)
MCH: 34.3 pg — ABNORMAL HIGH (ref 26.0–34.0)
MCHC: 34.1 g/dL (ref 30.0–36.0)
MCV: 100.5 fL — ABNORMAL HIGH (ref 78.0–100.0)
Monocytes Absolute: 1.9 10*3/uL — ABNORMAL HIGH (ref 0.1–1.0)
Monocytes Relative: 14 % — ABNORMAL HIGH (ref 3–12)
Neutro Abs: 11 10*3/uL — ABNORMAL HIGH (ref 1.7–7.7)
Platelets: 227 10*3/uL (ref 150–400)
RBC: 3.67 MIL/uL — ABNORMAL LOW (ref 3.87–5.11)

## 2012-11-17 LAB — LIPASE, BLOOD: Lipase: 20 U/L (ref 11–59)

## 2012-11-17 LAB — SURGICAL PCR SCREEN: MRSA, PCR: NEGATIVE

## 2012-11-17 SURGERY — LAPAROSCOPIC CHOLECYSTECTOMY
Anesthesia: General | Site: Abdomen | Wound class: Clean Contaminated

## 2012-11-17 MED ORDER — LACTATED RINGERS IR SOLN
Status: DC | PRN
  Administered 2012-11-17: 1

## 2012-11-17 MED ORDER — PROMETHAZINE HCL 25 MG/ML IJ SOLN: 6.2500 mg | INTRAMUSCULAR | Status: DC | PRN

## 2012-11-17 MED ORDER — MEPERIDINE HCL 25 MG/ML IJ SOLN: 6.2500 mg | INTRAMUSCULAR | Status: DC | PRN

## 2012-11-17 MED ORDER — PROPOFOL 10 MG/ML IV BOLUS
INTRAVENOUS | Status: DC | PRN
  Administered 2012-11-17: 160 mg via INTRAVENOUS

## 2012-11-17 MED ORDER — POTASSIUM CHLORIDE IN NACL 20-0.9 MEQ/L-% IV SOLN
INTRAVENOUS | Status: AC
  Filled 2012-11-17: qty 1000

## 2012-11-17 MED ORDER — BUPIVACAINE-EPINEPHRINE 0.25% -1:200000 IJ SOLN
INTRAMUSCULAR | Status: DC | PRN
  Administered 2012-11-17: 10 mL

## 2012-11-17 MED ORDER — ONDANSETRON HCL 4 MG/2ML IJ SOLN
INTRAMUSCULAR | Status: DC | PRN
  Administered 2012-11-17: 4 mg via INTRAVENOUS

## 2012-11-17 MED ORDER — ROCURONIUM BROMIDE 100 MG/10ML IV SOLN
INTRAVENOUS | Status: DC | PRN
  Administered 2012-11-17: 30 mg via INTRAVENOUS

## 2012-11-17 MED ORDER — DEXAMETHASONE SODIUM PHOSPHATE 10 MG/ML IJ SOLN
INTRAMUSCULAR | Status: DC | PRN
  Administered 2012-11-17: 10 mg via INTRAVENOUS

## 2012-11-17 MED ORDER — HYDROCODONE-ACETAMINOPHEN 5-325 MG PO TABS
1.0000 | ORAL_TABLET | ORAL | Status: DC | PRN
  Administered 2012-11-19: 1 via ORAL
  Filled 2012-11-17: qty 1

## 2012-11-17 MED ORDER — BUPIVACAINE-EPINEPHRINE PF 0.25-1:200000 % IJ SOLN
INTRAMUSCULAR | Status: AC
  Filled 2012-11-17: qty 30

## 2012-11-17 MED ORDER — SUCCINYLCHOLINE CHLORIDE 20 MG/ML IJ SOLN
INTRAMUSCULAR | Status: DC | PRN
  Administered 2012-11-17: 100 mg via INTRAVENOUS

## 2012-11-17 MED ORDER — PHENYLEPHRINE HCL 10 MG/ML IJ SOLN
INTRAMUSCULAR | Status: DC | PRN
  Administered 2012-11-17 (×2): 40 ug via INTRAVENOUS

## 2012-11-17 MED ORDER — NEOSTIGMINE METHYLSULFATE 1 MG/ML IJ SOLN
INTRAMUSCULAR | Status: DC | PRN
  Administered 2012-11-17: 4 mg via INTRAVENOUS

## 2012-11-17 MED ORDER — SODIUM CHLORIDE 0.9 % IV SOLN
INTRAVENOUS | Status: DC | PRN
  Administered 2012-11-17 (×2): via INTRAVENOUS

## 2012-11-17 MED ORDER — 0.9 % SODIUM CHLORIDE (POUR BTL) OPTIME
TOPICAL | Status: DC | PRN
  Administered 2012-11-17: 1000 mL

## 2012-11-17 MED ORDER — SODIUM CHLORIDE 0.9 % IV SOLN: INTRAVENOUS | Status: DC

## 2012-11-17 MED ORDER — DEXTROSE 5 % IV SOLN
INTRAVENOUS | Status: AC
  Filled 2012-11-17 (×2): qty 1

## 2012-11-17 MED ORDER — FENTANYL CITRATE 0.05 MG/ML IJ SOLN: 25.0000 ug | INTRAMUSCULAR | Status: DC | PRN

## 2012-11-17 MED ORDER — MIDAZOLAM HCL 5 MG/5ML IJ SOLN
INTRAMUSCULAR | Status: DC | PRN
  Administered 2012-11-17 (×2): 0.5 mg via INTRAVENOUS
  Administered 2012-11-17: 1 mg via INTRAVENOUS

## 2012-11-17 MED ORDER — FENTANYL CITRATE 0.05 MG/ML IJ SOLN
INTRAMUSCULAR | Status: DC | PRN
  Administered 2012-11-17 (×2): 50 ug via INTRAVENOUS
  Administered 2012-11-17: 100 ug via INTRAVENOUS
  Administered 2012-11-17: 50 ug via INTRAVENOUS

## 2012-11-17 MED ORDER — CEFOXITIN SODIUM 2 G IV SOLR
2.0000 g | INTRAVENOUS | Status: AC
  Administered 2012-11-17: 2 g via INTRAVENOUS

## 2012-11-17 MED ORDER — GLYCOPYRROLATE 0.2 MG/ML IJ SOLN
INTRAMUSCULAR | Status: DC | PRN
  Administered 2012-11-17: .5 mg via INTRAVENOUS

## 2012-11-17 SURGICAL SUPPLY — 32 items
APPLIER CLIP 5 13 M/L LIGAMAX5 (MISCELLANEOUS) ×3
CABLE HIGH FREQUENCY MONO STRZ (ELECTRODE) ×3 IMPLANT
CANISTER SUCTION 2500CC (MISCELLANEOUS) ×3 IMPLANT
CHLORAPREP W/TINT 26ML (MISCELLANEOUS) ×3 IMPLANT
CLIP APPLIE 5 13 M/L LIGAMAX5 (MISCELLANEOUS) ×2 IMPLANT
CLOTH BEACON ORANGE TIMEOUT ST (SAFETY) IMPLANT
COVER MAYO STAND STRL (DRAPES) IMPLANT
DERMABOND ADVANCED (GAUZE/BANDAGES/DRESSINGS) ×1
DERMABOND ADVANCED .7 DNX12 (GAUZE/BANDAGES/DRESSINGS) ×2 IMPLANT
DRAPE C-ARM 42X120 X-RAY (DRAPES) IMPLANT
DRAPE LAPAROSCOPIC ABDOMINAL (DRAPES) ×3 IMPLANT
DRAPE UTILITY XL STRL (DRAPES) ×3 IMPLANT
ELECT REM PT RETURN 9FT ADLT (ELECTROSURGICAL) ×3
ELECTRODE REM PT RTRN 9FT ADLT (ELECTROSURGICAL) ×2 IMPLANT
GOWN BRE IMP PREV XXLGXLNG (GOWN DISPOSABLE) ×3 IMPLANT
GOWN STRL REIN XL XLG (GOWN DISPOSABLE) ×6 IMPLANT
HEMOSTAT SURGICEL 4X8 (HEMOSTASIS) ×3 IMPLANT
KIT BASIN OR (CUSTOM PROCEDURE TRAY) ×3 IMPLANT
NS IRRIG 1000ML POUR BTL (IV SOLUTION) ×3 IMPLANT
POUCH SPECIMEN RETRIEVAL 10MM (ENDOMECHANICALS) ×6 IMPLANT
SCISSORS LAP 5X35 DISP (ENDOMECHANICALS) ×3 IMPLANT
SET CHOLANGIOGRAPH MIX (MISCELLANEOUS) IMPLANT
SET IRRIG TUBING LAPAROSCOPIC (IRRIGATION / IRRIGATOR) ×3 IMPLANT
SOLUTION ANTI FOG 6CC (MISCELLANEOUS) ×3 IMPLANT
SUT MNCRL AB 4-0 PS2 18 (SUTURE) ×3 IMPLANT
TOWEL OR 17X26 10 PK STRL BLUE (TOWEL DISPOSABLE) ×3 IMPLANT
TOWEL OR NON WOVEN STRL DISP B (DISPOSABLE) ×3 IMPLANT
TRAY LAP CHOLE (CUSTOM PROCEDURE TRAY) ×3 IMPLANT
TROCAR BLADELESS OPT 5 75 (ENDOMECHANICALS) ×3 IMPLANT
TROCAR SLEEVE XCEL 5X75 (ENDOMECHANICALS) ×6 IMPLANT
TROCAR XCEL BLUNT TIP 100MML (ENDOMECHANICALS) ×3 IMPLANT
TUBING INSUFFLATION 10FT LAP (TUBING) ×3 IMPLANT

## 2012-11-17 NOTE — Progress Notes (Signed)
[facility] PROGRESS NOTE  MAKI HEGE YNW:[id number] DOB: March 20, 1932 DOA: 11/15/2012 PCP: [name], MD  Brief narrative: [name] is an 79 y.o. female with a PMH of hypertension, hyperlipidemia, hypothyroidism, history of colon cancer, GERD, hiatal hernia, cholelithiasis, presumed CBD stones status post ERCP 11/14/2012 who was admitted post procedure for treatment of mild pancreatitis. She has been seen by the surgeons with plans to proceed with cholecystectomy today.  Assessment/Plan: Principal Problem:   Pancreatitis, acute,  status post ERCP with sphincterotomy / choledocholelithiasis with obstruction The patient was admitted and placed on bowel rest, IV fluids, anti-emetics, and medication for pain control. Gastroenterology recommended surgical consultation for cholecystectomy, which was requested on 11/16/2012. LFTs and lipase significantly improved status post sphincterotomy. Tolerated a clear liquid diet 11/16/2012, and underwent cholecystectomy this morning. Active Problems:   Calcification of aorta / atherosclerosis Found incidentally on imaging. Recommend risk factor modification.  On daily ASA (currently on hold).   Diabetes mellitus type 2 Fasting glucoses greater than 126x2. Hemoglobin A1c 6.8% corresponding to a mean plasma glucose of 148. She can likely be diet controlled and given her fastidiousness regarding her medications, it would likely be best for her primary care physician to address this with her.   Hypertension Systolic blood pressures slightly elevated despite treatment with Norvasc. Again, the patient is very fastidious regarding her current medications and it likely would be best for her primary care physician to adjust her routine medications.   Hypothyroidism Continue Synthroid. TSH mildly elevated 5.297. Free T4 is also slightly high. The patient may benefit from adjustment of her Synthroid dose, but prefers to have her primary care  physician do this.   Hypokalemia Corrected with supplementation.   Hyponatremia Mild, sodium bit lower today. Continue to monitor.   GERD Continue Protonix.   Hyperlipidemia Niaspan currently on hold since she is unable to take aspirin in anticipation of surgery. Continue statin.  Depression / Fibromyalgia Continue Pamelor.  Code Status: Full. Family Communication: No family at the bedside. Disposition Plan: Home when stable.   Medical Consultants:  Dr. Bernette Redbird, Gastroenterology  Other Consultants:  None.  Anti-infectives:  None.  HPI/Subjective: HAJER DWYER has a little abdominal soreness and dry mouth after returning from surgery.  No nausea or vomiting.  Objective: Filed Vitals:   11/16/12 2113 11/17/12 0506 11/17/12 0519 11/17/12 0551  BP: 155/78 174/85 174/85 151/77  Pulse: 107  105 106  Temp: 99.5 F (37.5 C)  98.4 F (36.9 C)   TempSrc: Oral  Oral   Resp: 20  20   Height:      Weight:      SpO2: 95%  96%     Intake/Output Summary (Last 24 hours) at 11/17/12 0743 Last data filed at 11/17/12 0655  Gross per 24 hour  Intake 1847.5 ml  Output   1675 ml  Net  172.5 ml    Exam: Gen:  NAD Cardiovascular:  Tachycardic, No M/R/G Respiratory:  Lungs CTAB Gastrointestinal:  Abdomen soft, slightly tender right upper quadrant, no guarding or rebound Extremities:  No C/E/C  Data Reviewed: Basic Metabolic Panel:  Recent Labs Lab 11/15/12 1313 11/16/12 0420 11/17/12 0355  NA 133* 133* 128*  K 3.4* 3.6 3.7  CL 95* 97 95*  CO2 26 23 23   GLUCOSE 155* 149* 153*  BUN 12 8 7   CREATININE 0.75 0.61 0.61  CALCIUM 10.4 9.6 9.8   GFR Estimated Creatinine Clearance: 57.3 ml/min (by C-G formula based on Cr of  0.61). Liver Function Tests:  Recent Labs Lab 11/15/12 1313 11/16/12 0420 11/17/12 0355  AST 423* 196* 64*  ALT 364* 255* 158*  ALKPHOS 318* 290* 263*  BILITOT 1.3* 0.9 1.0  PROT 8.2 7.7 7.7  ALBUMIN 3.8 3.6 3.5    Recent  Labs Lab 11/15/12 1313 11/16/12 0420 11/17/12 0355  LIPASE 2768* 166* 20   CBC:  Recent Labs Lab 11/15/12 1313 11/16/12 0420 11/17/12 0355  WBC 11.9* 12.8* 13.9*  NEUTROABS 9.6* 10.4* 11.0*  HGB 13.8 13.5 12.6  HCT 40.2 39.0 36.9  MCV 100.2* 100.0 100.5*  PLT 274 256 227   Thyroid function studies  Recent Labs  11/15/12 1318  TSH 5.297*    Procedures and Diagnostic Studies: Ct Abdomen Pelvis W Contrast  11/15/2012   CLINICAL DATA:  Mid abdominal pain. Nausea, vomiting.  EXAM: CT ABDOMEN AND PELVIS WITH CONTRAST  TECHNIQUE: Multidetector CT imaging of the abdomen and pelvis was performed using the standard protocol following bolus administration of intravenous contrast.  CONTRAST:  OMNIPAQUE IOHEXOL 300 MG/ML  SOLN  COMPARISON:  06/29/2011  FINDINGS: Lung bases are clear. No effusions. Heart is normal size.  Small hiatal hernia. Contrast material is noted within the esophagus which may reflect dysmotility or reflux.  New layering high-density material partially fills the gallbladder, presumably vicarious excretion of contrast. This obscures the previously seen small layering gallstones. Liver, spleen, adrenals are unremarkable. Pancreas is atrophic without focal abnormality. No biliary ductal dilatation. Small cyst in the midpole of the left kidney. Right kidney is unremarkable. No hydronephrosis.  Postsurgical changes are noted in the right lower quadrant. Again noted are calcifications and abnormal soft tissue adjacent to the anastomotic site. This is unchanged since prior study. Extensive sigmoid diverticulosis. No active diverticulitis. Small bowel is decompressed. Stomach is grossly unremarkable.  Uterus, adnexa and urinary bladder are unremarkable. No free fluid, free air or adenopathy. Aorta is calcified, non aneurysmal.  No acute bony abnormality.  IMPRESSION: Postsurgical changes in the right lower quadrant bowel. Adjacent partially calcified soft tissue is again noted  and not significantly changed, presumably postsurgical reaction.  High density material partially fills the gallbladder, presumably vicarious excretion of contrast which obscures the layering stones seen previously.  No acute findings in the abdomen or pelvis.  Sigmoid diverticulosis.   Electronically Signed   By: Charlett Nose M.D.   On: 11/15/2012 16:17   Dg Ercp With Sphincterotomy  11/14/2012   CLINICAL DATA:  Gallstones.  EXAM: ERCP  TECHNIQUE: Multiple spot images obtained with the fluoroscopic device and submitted for interpretation post-procedure.  COMPARISON:  Ultrasound 11/02/2012  FINDINGS: The common bile duct was cannulated and opacified with contrast. There may be partial filling of the gallbladder and cystic duct. Balloon was inflated within the common bile duct for stone removal. No definite filling defects within the common bile duct.  IMPRESSION: Opacification of the common bile duct. No definite stones were identified on these images.  These images were submitted for radiologic interpretation only. Please see the procedural report for the amount of contrast and the fluoroscopy time utilized.   Electronically Signed   By: Richarda Overlie M.D.   On: 11/14/2012 16:58   Dg Abd Acute W/chest  11/15/2012   CLINICAL DATA:  Epigastric pain status post ERCP.  EXAM: ACUTE ABDOMEN SERIES (ABDOMEN 2 VIEW & CHEST 1 VIEW)  COMPARISON:  CHEST x-ray 09/14/2010. CT of the abdomen and pelvis 06/29/2011.  FINDINGS: Lung volumes are normal. No consolidative airspace disease. No  pleural effusions. No pneumothorax. No pulmonary nodule or mass noted. Pulmonary vasculature and the cardiomediastinal silhouette are within normal limits. Atherosclerosis in the thoracic aorta.  Small amount of contrast material is noted throughout the colon. Gas and stool are seen scattered throughout the colon extending to the level of the distal rectum. No pathologic distension of small bowel is noted. No gross evidence of  pneumoperitoneum.  IMPRESSION: 1. Nonobstructive bowel gas pattern. 2. No pneumoperitoneum. 3. No radiographic evidence of acute cardiopulmonary disease. 4. Atherosclerosis.   Electronically Signed   By: Trudie Reed M.D.   On: 11/15/2012 13:54    Scheduled Meds: . amLODipine  5 mg Oral Daily  . calcium-vitamin D  1 tablet Oral BID WC  . cefOXitin  2 g Intravenous On Call to OR  . estradiol  0.5 mg Oral Q48H  . [START ON 11/19/2012] levothyroxine  125 mcg Oral Custom   And  . levothyroxine  137 mcg Oral Custom  . medroxyPROGESTERone  5 mg Oral Q48H  . multivitamin  1 tablet Oral Daily  . nortriptyline  75 mg Oral q morning - 10a  . pantoprazole  40 mg Oral BID AC  . simvastatin  20 mg Oral QHS  . vitamin B-12  1,000 mcg Oral Daily  . vitamin C  500 mg Oral Daily  . [START ON 11/25/2012] Vitamin D (Ergocalciferol)  50,000 Units Oral Q14 Days  . vitamin E  400 Units Oral BID  . zolpidem  5 mg Oral QHS   Continuous Infusions: . 0.9 % NaCl with KCl 20 mEq / L 50 mL/hr at 11/17/12 0457    Time spent: 25 minutes.    LOS: 2 days   Jeris Easterly  [facility] Pager [phone].   *Please note that the hospitalists switch teams on Wednesdays. Please call the flow manager at [phone] if you are having difficulty reaching the hospitalist taking care of this patient as she can update you and provide the most up-to-date pager number of provider caring for the patient. If 8PM-8AM, please contact night-coverage at [email or web address], password [facility]  11/17/2012, 7:43 AM

## 2012-11-17 NOTE — Interval H&P Note (Signed)
History and Physical Interval Note:  11/17/2012 8:07 AM  [name]  has presented today for surgery, with the diagnosis of Choleycystiits  The various methods of treatment have been discussed with the patient and family. After consideration of risks, benefits and other options for treatment, the patient has consented to  Procedure(s): LAPAROSCOPIC CHOLECYSTECTOMY WITH INTRAOPERATIVE CHOLANGIOGRAM (N/A), possible open as a surgical intervention .  The patient's history has been reviewed, patient examined, no change in status, stable for surgery.  I have reviewed the patient's chart and labs.  Questions were answered to the patient's satisfaction.     The anatomy & physiology of hepatobiliary & pancreatic function was discussed.  The pathophysiology of gallbladder dysfunction was discussed.  Natural history risks without surgery was discussed.   I feel the risks of no intervention will lead to serious problems that outweigh the operative risks; therefore, I recommended cholecystectomy to remove the pathology.  I explained laparoscopic techniques with possible need for an open approach.  Probable cholangiogram to evaluate the bilary tract was explained as well.    Risks such as bleeding, infection, abscess, leak, injury to other organs, need for further treatment, heart attack, death, and other risks were discussed.  I noted a good likelihood this will help address the problem.  Possibility that this will not correct all abdominal symptoms was explained.  Goals of post-operative recovery were discussed as well.  We will work to minimize complications.  An educational handout further explaining the pathology and treatment options was given as well.  Questions were answered.  The patient expresses understanding & wishes to proceed with surgery.  Vanita Panda, MD  Colorectal and General Surgery [facility]

## 2012-11-17 NOTE — Anesthesia Preprocedure Evaluation (Addendum)
Anesthesia Evaluation  Patient identified by MRN, date of birth, ID band Patient awake    Reviewed: Allergy & Precautions, H&P , Patient's Chart, lab work & pertinent test results, reviewed documented beta blocker date and time   History of Anesthesia Complications (+) PONV and history of anesthetic complications  Airway Mallampati: II TM Distance: >3 FB Neck ROM: full  Mouth opening: Limited Mouth Opening  Dental   Pulmonary shortness of breath,  breath sounds clear to auscultation        Cardiovascular Exercise Tolerance: Good hypertension, + Peripheral Vascular Disease + Valvular Problems/Murmurs Rhythm:regular Rate:Normal + Systolic murmurs    Neuro/Psych  Headaches, PSYCHIATRIC DISORDERS  Neuromuscular disease    GI/Hepatic hiatal hernia, GERD-  ,  Endo/Other  diabetesHypothyroidism   Renal/GU      Musculoskeletal  (+) Fibromyalgia -  Abdominal   Peds  Hematology  (+) anemia ,   Anesthesia Other Findings Goiter, tmj, djd, pancreatitis, goiter  Reproductive/Obstetrics negative OB ROS                          Anesthesia Physical Anesthesia Plan  ASA: III  Anesthesia Plan: General ETT   Post-op Pain Management:    Induction: Rapid sequence and Cricoid pressure planned  Airway Management Planned: Oral ETT  Additional Equipment:   Intra-op Plan:   Post-operative Plan:   Informed Consent: I have reviewed the patients History and Physical, chart, labs and discussed the procedure including the risks, benefits and alternatives for the proposed anesthesia with the patient or authorized representative who has indicated his/her understanding and acceptance.   Dental Advisory Given  Plan Discussed with: CRNA and Surgeon  Anesthesia Plan Comments:        Anesthesia Quick Evaluation

## 2012-11-17 NOTE — Transfer of Care (Signed)
Immediate Anesthesia Transfer of Care Note  Patient: [name]  Procedure(s) Performed: Procedure(s): LAPAROSCOPIC CHOLECYSTECTOMY (N/A)  Patient Location: PACU  Anesthesia Type:General  Level of Consciousness: awake, alert  and oriented  Airway & Oxygen Therapy: Patient Spontanous Breathing and Patient connected to face mask oxygen  Post-op Assessment: Report given to PACU RN and Post -op Vital signs reviewed and stable  Post vital signs: Reviewed and stable  Complications: No apparent anesthesia complications

## 2012-11-17 NOTE — Op Note (Signed)
11/15/2012 - 11/17/2012  10:05 AM  PATIENT:  [name]  79 y.o. female  Patient Care Team: [name], MD as PCP - General (Family Medicine) Petra Kuba, MD as Consulting Physician (Gastroenterology)  PRE-OPERATIVE DIAGNOSIS:  Choledocholithiasis  POST-OPERATIVE DIAGNOSIS:  Choledocholithiasis  PROCEDURE:  Procedure(s): LAPAROSCOPIC CHOLECYSTECTOMY  SURGEON:  Surgeon(s): Romie Levee, MD  ASSISTANT: none   ANESTHESIA:   general  EBL:  Total I/O In: 1000 [I.V.:1000] Out: -   DRAINS: none   SPECIMEN:  Source of Specimen:  Gallbladder  DISPOSITION OF SPECIMEN:  PATHOLOGY  COUNTS:  YES  PLAN OF CARE: Pt already admitted  PATIENT DISPOSITION:  PACU - hemodynamically stable.  INDICATION: this is a 79 year old female who underwent ERCP for choledocholithiasis. Sphincterotomy was performed. She did have a slight bout of pancreatitis afterwards. This is now resolved. It was decided that we should remove the gallbladder to prevent further episodes of choledocholithiasis and possible gallstone pancreatitis. The risk and benefits of this procedure were explained to the patient prior to the OR and consent was placed on chart.   OR FINDINGS: multiple postsurgical adhesions. Normal appearing gallbladder. Normal appearing anatomy.  DESCRIPTION:   The patient was identified & brought into the operating room. The patient was positioned supine with arms tucked. SCDs were active during the entire case. The patient underwent general anesthesia without any difficulty.  The abdomen was prepped and draped in a sterile fashion. A Surgical Timeout was performed and confirmed our plan.  We positioned the patient in reverse [place] & right side up.  I placed a Hassan laparoscopic port through the umbilicus using open entry technique.  Entry was clean. There were adhesions to the anterior abdominal wall that were bluntly taken down prior to insertion of the hassan.  We induced  carbon dioxide insufflation. Camera inspection revealed no injury.    I proceeded to continue with laparoscopic technique.  I placed a subxiphoid port using a 5 mm trocar. This was done under direct visualization.  I turned attention to the right upper quadrant.  To obtain access to the right upper quadrant I had to lyse several adhesions, which appeared to be mostly small bowel. This was done using mostly blunt dissection. I did use sharp dissection to take down some more dense adhesions. Once this was cleared out of the way and we could identify the gallbladder safely and the adhesions were cleared from the right lateral sidewall, 2 more 5 mm ports were placed subcostally. These were also under direct visualization.  The gallbladder fundus was elevated cephalad. I used cautery and blunt dissection to free the peritoneal coverings between the gallbladder and the liver on the posteriolateral and anteriomedial walls.   I used careful blunt and cautery dissection with a maryland dissector to help get a good critical view of the cystic artery and cystic duct. I did further dissection to free a few centimeters of the  gallbladder off the liver bed to get a good critical view of the infundibulum and cystic duct. I mobilized the cystic artery.  I skeletonized the cystic duct.  After getting a good 360 view, I decided not to perform a cholangiogram.  This was due to her previous pancreatitis as well as great visualization of the anatomy with minimal inflammation.  I placed a clip on the infundibulum.  I placed clips on the cystic duct x3.  I completed cystic duct transection.   I placed clips on the cystic artery x3 with 2 proximally.  I ligated the cystic artery using scissors. I freed the gallbladder from its remaining attachments to the liver. I ensured hemostasis on the gallbladder fossa of the liver and elsewhere.  Bovie electric cautery was used to obtain hemostasis within the gallbladder fossa. I inspected the  rest of the abdomen and copiously irrigated the right upper quadrant.  I detected no injury elsewhere.  There was some mild oozing in the right upper quadrant.  I irrigated the RUQ with normal saline.  I saw no other tract source of bleeding. A piece of Surgicel was placed in the gallbladder fossa and the right upper quadrant for added hemostasis.  I removed the gallbladder through the umbilical port site.  I closed the umbilical fascia using 0 Vicryl stitches.   I closed the skin using 4-0 Monocryl stitch.  Sterile dressings were applied. The patient was extubated & arrived in the PACU in stable condition.  I had discussed postoperative care with the patient in the holding area.   I will discuss  operative findings and postoperative goals / instructions with the patient's family.  Instructions are written in the chart as well.

## 2012-11-17 NOTE — H&P (View-Only) (Signed)
ATTENDING ADDENDUM:  I personally reviewed patient's record, examined the patient, and formulated the following assessment and plan:  Pt s/p sphincterotomy and stone removal.  Developed post ERCP pancreatitis.  Lipase resolving.  Will recheck in AM and plan on OR as long as lipase continuing to go down.

## 2012-11-17 NOTE — H&P (View-Only) (Signed)
[name] 01/02/1933  [id number].   Requesting MD: Dr. Rama Chief Complaint/Reason for Consult: cholelithiasis HPI: This is a 79yo female with multiple medical problems who for the last 7 weeks has been having epigastric abdominal pain that she thought was secondary to her hiatal hernia.  She has been having worsening reflux symptoms along with esophageal spasms during this time frame as well.  She has been seeing Dr. Magod and was noted to have choledocholithiasis.  She underwent an ERCP with stone extraction and sphincterotomy.  She was doing well, until the follow day when she developed severe LUQ abdominal pain.  She came to [facility] where she was noted to have pancreatitis, likely post ERCP related.  She has been admitted for further treatment.  Her lipase was initially 2768 yesterday, but has come down to 166 today.  We have been asked to evaluate her for cholecystectomy.  ROS: Please see HPI, otherwise all other systems have been reviewed and are negative.  Family History  Problem Relation Age of Onset  . Heart disease Mother     Enlarged heart  . Arrhythmia Mother   . Diabetes Mother   . Stroke Sister   . Heart disease Brother     MI at unknown age  . Anesthesia problems Brother   . Heart attack      MI at age 73  . Heart disease Brother     CABG    Past Medical History  Diagnosis Date  . Hypertension   . Hypothyroid   . Goiter   . GERD (gastroesophageal reflux disease)   . Fibromyalgia   . Heart murmur   . Peripheral vascular disease     varicose veins in left arm   . Shortness of breath     with exertion   . Anemia     hx of in 2003   . Hiatal hernia   . Headache(784.0)     tension headache daily per pt   . Arthritis   . Depression   . DJD (degenerative joint disease)   . Osteoarthritis   . Colon cancer     s/p resecton of colon in 2003 , hx of chemo  . Headaches, cluster   . Abnormal uterine bleeding (AUB)     on cont HRT  . Colon cancer   . TMJ  (dislocation of temporomandibular joint)   . Compression fracture jan 2012    due to fall  . PONV (postoperative nausea and vomiting)   . Choledocholithiasis with obstruction 11/16/2012  . Pancreatitis, acute 11/15/2012  . Calcification of aorta 11/16/2012  . Macular degeneration     Past Surgical History  Procedure Laterality Date  . Tmj arthroplasty  1990's  . Appendectomy    . Colon resection  2003  . Eye surgery      bilateral cataract surgery   . Total knee arthroplasty  12/20/2010    Procedure: TOTAL KNEE ARTHROPLASTY;  Surgeon: Matthew D Olin;  Location: [facility];  Service: Orthopedics;  Laterality: Right;  . Colonoscopy w/ biopsies  09/25/2002, 09/18/2001  . Upper gastrointestinal endoscopy  09/18/2001  . Porta catheter placement  10/30/2001  . Porta catheter removal  06/27/2003  . Tmj arthroplasty    . Dilation and curettage of uterus    . Hysteroscopy    . Esophagogastroduodenoscopy endoscopy    . Vertebroplasty  2012  . Back surgery  2000    lower  . Tonsillectomy  age 7 or 8  . Joint   replacement Right dec 2012    knee  . Ercp N/A 11/14/2012    Procedure: ENDOSCOPIC RETROGRADE CHOLANGIOPANCREATOGRAPHY (ERCP);  Surgeon: Marc E Magod, MD;  Location: [facility];  Service: Endoscopy;  Laterality: N/A;    Social History:  reports that she has never smoked. She has never used smokeless tobacco. She reports that she does not drink alcohol or use illicit drugs.  Allergies:  Allergies  Allergen Reactions  . Tape Rash    Blisters, nausea, and vomitting  . Latex Other (See Comments)    blister  . Lidocaine Nausea And Vomiting  . Sulfur Nausea And Vomiting    Medications Prior to Admission  Medication Sig Dispense Refill  . acetaminophen (TYLENOL) 500 MG tablet Take 500 mg by mouth every 6 (six) hours as needed.       . amLODipine (NORVASC) 5 MG tablet Take 5 mg by mouth every morning.       . aspirin 325 MG tablet Take 325 mg by mouth at bedtime.      . Calcium-Vitamin  D (CALTRATE 600 PLUS-VIT D PO) Take 1 tablet by mouth 2 (two) times daily.       . docusate sodium (COLACE) 100 MG capsule Take 100 mg by mouth 3 (three) times daily as needed for constipation.      . estradiol (ESTRACE) 0.5 MG tablet Take 0.5 tablets (0.25 mg total) by mouth daily.  90 tablet  3  . levothyroxine (SYNTHROID) 125 MCG tablet Take 125 mcg by mouth See admin instructions. Monday thru Thursday      . levothyroxine (SYNTHROID) 137 MCG tablet Take 137 mcg by mouth See admin instructions. Friday thru Sunday      . medroxyPROGESTERone (PROVERA) 5 MG tablet Take 0.5 tablets (2.5 mg total) by mouth daily. 2.5 mg tab daily  90 tablet  3  . Multiple Vitamins-Minerals (MULTI COMPLETE PO) Take by mouth daily.       . niacin (NIASPAN) 1000 MG CR tablet Take 1,000 mg by mouth at bedtime.       . nortriptyline (PAMELOR) 25 MG capsule Take 75 mg by mouth every morning.       . omeprazole (PRILOSEC) 40 MG capsule Take 40 mg by mouth 2 (two) times daily.      . simvastatin (ZOCOR) 20 MG tablet Take 20 mg by mouth at bedtime.       . Teriparatide, Recombinant, (FORTEO [place]) Inject 20 mcg into the skin daily.       . vitamin B-12 (CYANOCOBALAMIN) 1000 MCG tablet Take 1,000 mcg by mouth daily.      . vitamin C (ASCORBIC ACID) 500 MG tablet Take 500 mg by mouth daily.       . Vitamin D, Ergocalciferol, (DRISDOL) 50000 UNITS CAPS capsule Take 1 capsule (50,000 Units total) by mouth every 14 (fourteen) days.  26 capsule  1  . vitamin E 400 UNIT capsule Take 400 Units by mouth 2 (two) times daily.       . zolpidem (AMBIEN) 10 MG tablet Take 10 mg by mouth at bedtime.      . [DISCONTINUED] Multiple Vitamins-Minerals (PRESERVISION AREDS PO) Take 1 tablet by mouth 2 (two) times daily.        Blood pressure 149/83, pulse 110, temperature 98.8 F (37.1 C), temperature source Oral, resp. rate 20, height 5' 7" (1.702 m), weight 137 lb (62.143 kg), SpO2 96.00%. Physical Exam: General: pleasant, WD, WN white female  who is laying in bed in   NAD HEENT: head is normocephalic, atraumatic.  Sclera are noninjected.  PERRL.  Ears and nose without any masses or lesions.  Mouth is pink and moist Heart: regular, rate, and rhythm.  Normal s1,s2. No obvious gallops or rubs noted. Murmur noted  Palpable radial and pedal pulses bilaterally Lungs: CTAB, no wheezes, rhonchi, or rales noted.  Respiratory effort nonlabored Abd: soft, mild tenderness in RUQ, epigastrium, and LUQ, ND, +BS, no masses, hernias, or organomegaly.  No guarding or rebounding.  Small transverse RMQ scar noted from her right colectomy in 2003 MS: all 4 extremities are symmetrical with no cyanosis, clubbing, or edema. Skin: warm and dry with no masses, lesions, or rashes Psych: A&Ox3 with an appropriate affect.    Results for orders placed during the hospital encounter of 11/15/12 (from the past 48 hour(s))  CBC WITH DIFFERENTIAL     Status: Abnormal   Collection Time    11/15/12  1:13 PM      Result Value Range   WBC 11.9 (*) 4.0 - 10.5 K/uL   RBC 4.01  3.87 - 5.11 MIL/uL   Hemoglobin 13.8  12.0 - 15.0 g/dL   HCT 40.2  36.0 - 46.0 %   MCV 100.2 (*) 78.0 - 100.0 fL   MCH 34.4 (*) 26.0 - 34.0 pg   MCHC 34.3  30.0 - 36.0 g/dL   RDW 12.3  11.5 - 15.5 %   Platelets 274  150 - 400 K/uL   Neutrophils Relative % 80 (*) 43 - 77 %   Neutro Abs 9.6 (*) 1.7 - 7.7 K/uL   Lymphocytes Relative 9 (*) 12 - 46 %   Lymphs Abs 1.1  0.7 - 4.0 K/uL   Monocytes Relative 10  3 - 12 %   Monocytes Absolute 1.2 (*) 0.1 - 1.0 K/uL   Eosinophils Relative 0  0 - 5 %   Eosinophils Absolute 0.0  0.0 - 0.7 K/uL   Basophils Relative 0  0 - 1 %   Basophils Absolute 0.0  0.0 - 0.1 K/uL  LIPASE, BLOOD     Status: Abnormal   Collection Time    11/15/12  1:13 PM      Result Value Range   Lipase 2768 (*) 11 - 59 U/L  COMPREHENSIVE METABOLIC PANEL     Status: Abnormal   Collection Time    11/15/12  1:13 PM      Result Value Range   Sodium 133 (*) 135 - 145 mEq/L    Potassium 3.4 (*) 3.5 - 5.1 mEq/L   Chloride 95 (*) 96 - 112 mEq/L   CO2 26  19 - 32 mEq/L   Glucose, Bld 155 (*) 70 - 99 mg/dL   BUN 12  6 - 23 mg/dL   Creatinine, Ser 0.75  0.50 - 1.10 mg/dL   Calcium 10.4  8.4 - 10.5 mg/dL   Total Protein 8.2  6.0 - 8.3 g/dL   Albumin 3.8  3.5 - 5.2 g/dL   AST 423 (*) 0 - 37 U/L   ALT 364 (*) 0 - 35 U/L   Alkaline Phosphatase 318 (*) 39 - 117 U/L   Total Bilirubin 1.3 (*) 0.3 - 1.2 mg/dL   GFR calc non Af Amer 80 (*) >90 mL/min   GFR calc Af Amer >90  >90 mL/min   Comment: (NOTE)     The eGFR has been calculated using the CKD EPI equation.     This calculation has not been validated in   all clinical situations.     eGFR's persistently <90 mL/min signify possible Chronic Kidney     Disease.  TSH     Status: Abnormal   Collection Time    11/15/12  1:18 PM      Result Value Range   TSH 5.297 (*) 0.350 - 4.500 uIU/mL   Comment: Performed at [facility]  COMPREHENSIVE METABOLIC PANEL     Status: Abnormal   Collection Time    11/16/12  4:20 AM      Result Value Range   Sodium 133 (*) 135 - 145 mEq/L   Potassium 3.6  3.5 - 5.1 mEq/L   Chloride 97  96 - 112 mEq/L   CO2 23  19 - 32 mEq/L   Glucose, Bld 149 (*) 70 - 99 mg/dL   BUN 8  6 - 23 mg/dL   Creatinine, Ser 0.61  0.50 - 1.10 mg/dL   Calcium 9.6  8.4 - 10.5 mg/dL   Total Protein 7.7  6.0 - 8.3 g/dL   Albumin 3.6  3.5 - 5.2 g/dL   AST 196 (*) 0 - 37 U/L   ALT 255 (*) 0 - 35 U/L   Alkaline Phosphatase 290 (*) 39 - 117 U/L   Total Bilirubin 0.9  0.3 - 1.2 mg/dL   GFR calc non Af Amer 85 (*) >90 mL/min   GFR calc Af Amer >90  >90 mL/min   Comment: (NOTE)     The eGFR has been calculated using the CKD EPI equation.     This calculation has not been validated in all clinical situations.     eGFR's persistently <90 mL/min signify possible Chronic Kidney     Disease.  LIPASE, BLOOD     Status: Abnormal   Collection Time    11/16/12  4:20 AM      Result Value Range   Lipase 166  (*) 11 - 59 U/L  CBC WITH DIFFERENTIAL     Status: Abnormal   Collection Time    11/16/12  4:20 AM      Result Value Range   WBC 12.8 (*) 4.0 - 10.5 K/uL   RBC 3.90  3.87 - 5.11 MIL/uL   Hemoglobin 13.5  12.0 - 15.0 g/dL   HCT 39.0  36.0 - 46.0 %   MCV 100.0  78.0 - 100.0 fL   MCH 34.6 (*) 26.0 - 34.0 pg   MCHC 34.6  30.0 - 36.0 g/dL   RDW 12.4  11.5 - 15.5 %   Platelets 256  150 - 400 K/uL   Neutrophils Relative % 81 (*) 43 - 77 %   Neutro Abs 10.4 (*) 1.7 - 7.7 K/uL   Lymphocytes Relative 9 (*) 12 - 46 %   Lymphs Abs 1.1  0.7 - 4.0 K/uL   Monocytes Relative 10  3 - 12 %   Monocytes Absolute 1.3 (*) 0.1 - 1.0 K/uL   Eosinophils Relative 0  0 - 5 %   Eosinophils Absolute 0.0  0.0 - 0.7 K/uL   Basophils Relative 0  0 - 1 %   Basophils Absolute 0.0  0.0 - 0.1 K/uL  T4, FREE     Status: Abnormal   Collection Time    11/16/12  9:10 AM      Result Value Range   Free T4 1.92 (*) 0.80 - 1.80 ng/dL   Comment: Performed at [facility]   Ct Abdomen Pelvis W Contrast  11/15/2012     CLINICAL DATA:  Mid abdominal pain. Nausea, vomiting.  EXAM: CT ABDOMEN AND PELVIS WITH CONTRAST  TECHNIQUE: Multidetector CT imaging of the abdomen and pelvis was performed using the standard protocol following bolus administration of intravenous contrast.  CONTRAST:  100mL OMNIPAQUE IOHEXOL 300 MG/ML  SOLN  COMPARISON:  06/29/2011  FINDINGS: Lung bases are clear. No effusions. Heart is normal size.  Small hiatal hernia. Contrast material is noted within the esophagus which may reflect dysmotility or reflux.  New layering high-density material partially fills the gallbladder, presumably vicarious excretion of contrast. This obscures the previously seen small layering gallstones. Liver, spleen, adrenals are unremarkable. Pancreas is atrophic without focal abnormality. No biliary ductal dilatation. Small cyst in the midpole of the left kidney. Right kidney is unremarkable. No hydronephrosis.  Postsurgical  changes are noted in the right lower quadrant. Again noted are calcifications and abnormal soft tissue adjacent to the anastomotic site. This is unchanged since prior study. Extensive sigmoid diverticulosis. No active diverticulitis. Small bowel is decompressed. Stomach is grossly unremarkable.  Uterus, adnexa and urinary bladder are unremarkable. No free fluid, free air or adenopathy. Aorta is calcified, non aneurysmal.  No acute bony abnormality.  IMPRESSION: Postsurgical changes in the right lower quadrant bowel. Adjacent partially calcified soft tissue is again noted and not significantly changed, presumably postsurgical reaction.  High density material partially fills the gallbladder, presumably vicarious excretion of contrast which obscures the layering stones seen previously.  No acute findings in the abdomen or pelvis.  Sigmoid diverticulosis.   Electronically Signed   By: Kevin  Dover M.D.   On: 11/15/2012 16:17   Dg Ercp With Sphincterotomy  11/14/2012   CLINICAL DATA:  Gallstones.  EXAM: ERCP  TECHNIQUE: Multiple spot images obtained with the fluoroscopic device and submitted for interpretation post-procedure.  COMPARISON:  Ultrasound 11/02/2012  FINDINGS: The common bile duct was cannulated and opacified with contrast. There may be partial filling of the gallbladder and cystic duct. Balloon was inflated within the common bile duct for stone removal. No definite filling defects within the common bile duct.  IMPRESSION: Opacification of the common bile duct. No definite stones were identified on these images.  These images were submitted for radiologic interpretation only. Please see the procedural report for the amount of contrast and the fluoroscopy time utilized.   Electronically Signed   By: Adam  Henn M.D.   On: 11/14/2012 16:58   Dg Abd Acute W/chest  11/15/2012   CLINICAL DATA:  Epigastric pain status post ERCP.  EXAM: ACUTE ABDOMEN SERIES (ABDOMEN 2 VIEW & CHEST 1 VIEW)  COMPARISON:  CHEST  x-ray 09/14/2010. CT of the abdomen and pelvis 06/29/2011.  FINDINGS: Lung volumes are normal. No consolidative airspace disease. No pleural effusions. No pneumothorax. No pulmonary nodule or mass noted. Pulmonary vasculature and the cardiomediastinal silhouette are within normal limits. Atherosclerosis in the thoracic aorta.  Small amount of contrast material is noted throughout the colon. Gas and stool are seen scattered throughout the colon extending to the level of the distal rectum. No pathologic distension of small bowel is noted. No gross evidence of pneumoperitoneum.  IMPRESSION: 1. Nonobstructive bowel gas pattern. 2. No pneumoperitoneum. 3. No radiographic evidence of acute cardiopulmonary disease. 4. Atherosclerosis.   Electronically Signed   By: Daniel  Entrikin M.D.   On: 11/15/2012 13:54       Assessment/Plan 1. Post ERCP pancreatitis 2. S/p ERCP secondary to choledocholithiasis 3. Cholelithiasis Patient Active Problem List   Diagnosis Date Noted  . Hypokalemia 11/16/2012  .   Hyponatremia 11/16/2012  . GERD (gastroesophageal reflux disease) 11/16/2012  . Choledocholithiasis with obstruction 11/16/2012  . Calcification of aorta 11/16/2012  . Atherosclerosis 11/16/2012  . Hyperglycemia 11/16/2012  . Pancreatitis, acute 11/15/2012  . Hypothyroidism 11/15/2012  . Nonspecific (abnormal) findings on radiological and other examination of gastrointestinal tract 11/08/2012  . S/P right knee replacement 12/22/2010  . Preop cardiovascular exam 12/08/2010  . Hypertension 12/08/2010  . Hyperlipidemia 12/08/2010   Plan: 1. If the patient's pancreatitis continues to improve and her labs improve, we will plan on cholecystectomy possibly as soon as tomorrow.  She can have clears today as long as they don't make her pain worse.  NPO p MN for possible surgery tomorrow. 2. Recheck labs in am  Carter Kassel E 11/16/2012, 2:19 PM Pager: [phone]  

## 2012-11-17 NOTE — Anesthesia Postprocedure Evaluation (Signed)
  Anesthesia Post-op Note  Patient: [name]  Procedure(s) Performed: Procedure(s): LAPAROSCOPIC CHOLECYSTECTOMY (N/A)  Patient Location: PACU  Anesthesia Type:General  Level of Consciousness: awake, alert  and oriented  Airway and Oxygen Therapy: Patient Spontanous Breathing  Post-op Pain: none  Post-op Assessment: Post-op Vital signs reviewed, Patient's Cardiovascular Status Stable, Respiratory Function Stable, Patent Airway, No signs of Nausea or vomiting and Pain level controlled  Post-op Vital Signs: Reviewed and stable  Complications: No apparent anesthesia complications

## 2012-11-18 LAB — COMPREHENSIVE METABOLIC PANEL
ALT: 109 U/L — ABNORMAL HIGH (ref 0–35)
CO2: 23 mEq/L (ref 19–32)
Calcium: 8.7 mg/dL (ref 8.4–10.5)
Creatinine, Ser: 0.66 mg/dL (ref 0.50–1.10)
GFR calc Af Amer: >90 mL/min (ref >90)
GFR calc non Af Amer: 83 mL/min — ABNORMAL LOW (ref >90)
Glucose, Bld: 236 mg/dL — ABNORMAL HIGH (ref 70–99)
Potassium: 3.4 mEq/L — ABNORMAL LOW (ref 3.5–5.1)
Sodium: 128 mEq/L — ABNORMAL LOW (ref 135–145)
Total Protein: 6.3 g/dL (ref 6.0–8.3)

## 2012-11-18 LAB — CBC
HCT: 33.6 % — ABNORMAL LOW (ref 36.0–46.0)
Hemoglobin: 12 g/dL (ref 12.0–15.0)
Hemoglobin: 11.4 g/dL — ABNORMAL LOW (ref 12.0–15.0)
MCH: 34.4 pg — ABNORMAL HIGH (ref 26.0–34.0)
MCHC: 34.2 g/dL (ref 30.0–36.0)
MCHC: 33.9 g/dL (ref 30.0–36.0)
MCV: 100.6 fL — ABNORMAL HIGH (ref 78.0–100.0)
RBC: 3.49 MIL/uL — ABNORMAL LOW (ref 3.87–5.11)
RBC: 3.33 MIL/uL — ABNORMAL LOW (ref 3.87–5.11)
WBC: 11.5 10*3/uL — ABNORMAL HIGH (ref 4.0–10.5)

## 2012-11-18 MED ORDER — POLYETHYLENE GLYCOL 3350 17 G PO PACK
17.0000 g | PACK | Freq: Every day | ORAL | Status: DC
  Administered 2012-11-18: 17 g via ORAL
  Filled 2012-11-18 (×2): qty 1

## 2012-11-18 MED ORDER — POTASSIUM CHLORIDE IN NACL 40-0.9 MEQ/L-% IV SOLN
INTRAVENOUS | Status: DC
  Administered 2012-11-18: 11:00:00 via INTRAVENOUS
  Administered 2012-11-19: 50 mL/h via INTRAVENOUS
  Filled 2012-11-18 (×2): qty 1000

## 2012-11-18 MED ORDER — SIMETHICONE 80 MG PO CHEW
80.0000 mg | CHEWABLE_TABLET | Freq: Four times a day (QID) | ORAL | Status: DC | PRN
  Administered 2012-11-18: 80 mg via ORAL
  Filled 2012-11-18: qty 1

## 2012-11-18 NOTE — Progress Notes (Signed)
11/18/12 1745 Patient given 4oz of warm prune juice to help with bowel movement.

## 2012-11-18 NOTE — Progress Notes (Addendum)
[facility] PROGRESS NOTE  [name] ZOX:[id number] DOB: [birth year] DOA: 11/15/2012 PCP: [name], MD  Brief narrative: [name] is an 79 y.o. female with a PMH of hypertension, hyperlipidemia, hypothyroidism, history of colon cancer, GERD, hiatal hernia, cholelithiasis, presumed CBD stones status post ERCP 11/14/2012 who was admitted post procedure for treatment of mild pancreatitis. She has been seen by the surgeons with plans to proceed with cholecystectomy today.  Assessment/Plan: Principal Problem:   Pancreatitis, acute, status post ERCP with sphincterotomy / choledocholelithiasis with obstruction status post cholecystectomy with post-op ileus The patient was admitted and placed on bowel rest, IV fluids, anti-emetics, and medication for pain control. Gastroenterology recommended surgical consultation for cholecystectomy, which was requested on 11/16/2012.  LFTs and lipase significantly improved status post sphincterotomy. Tolerated a clear liquid diet 11/16/2012, and underwent a laparoscopic cholecystectomy 11/17/2012. Stable postoperatively, except for possible ileus.  Diet to be advanced as tolerated. Increase mobilization. Active Problems:   Calcification of aorta / atherosclerosis Found incidentally on imaging. Recommend risk factor modification.  On daily ASA (currently on hold given recent surgery).   Diabetes mellitus type 2 Fasting glucoses greater than 126x2. Hemoglobin A1c 6.8% corresponding to a mean plasma glucose of 148. She can likely be diet controlled and given her fastidiousness regarding her medications, it would likely be best for her primary care physician to address this with her.   Hypertension Systolic blood pressures slightly elevated despite treatment with Norvasc. Again, the patient is very fastidious regarding her current medications and it likely would be best for her primary care physician to adjust her routine medications.    Hypothyroidism Continue Synthroid. TSH mildly elevated 5.297. Free T4 is also slightly high. The patient may benefit from adjustment of her Synthroid dose, but prefers to have her primary care physician do this.   Hypokalemia Initially corrected with supplementation. Potassium 3.4 today. Will increase potassium in IV fluids to 40 mEq/liter.   Hyponatremia Mild, sodium bit lower today. Continue to monitor.   GERD Continue Protonix.   Hyperlipidemia Niaspan currently on hold since she is unable to take aspirin in anticipation of surgery. Continue statin. Resume Niaspan one aspirin is able to be restarted.  Depression / Fibromyalgia Continue Pamelor.  Code Status: Full. Family Communication: Son updated at the bedside. Disposition Plan: Discharge home in next 1-2 days.   Medical Consultants:  Dr. Bernette Redbird, Gastroenterology  Dr. Romie Levee, Surgery.  Other Consultants:  None.  Anti-infectives:  None.  HPI/Subjective: [name] tells me she had a bad night last night with a lot of gas pain and abdominal discomfort. No nausea or vomiting. She is currently sitting up in a chair and says that the pain has improved. Tolerating clear liquids. Passing some flatus.  Objective: Filed Vitals:   11/17/12 1104 11/17/12 1535 11/17/12 2121 11/18/12 0505  BP: 154/74 147/86 132/67 123/69  Pulse: 91 109 100 97  Temp: 98.7 F (37.1 C) 97.8 F (36.6 C) 99.1 F (37.3 C) 98.5 F (36.9 C)  TempSrc:  Oral Oral Oral  Resp: 14 16 16 18   Height:      Weight:      SpO2: 98% 98% 99% 97%    Intake/Output Summary (Last 24 hours) at 11/18/12 0741 Last data filed at 11/18/12 0703  Gross per 24 hour  Intake   3140 ml  Output   1875 ml  Net   1265 ml    Exam: Gen:  NAD Cardiovascular:  Tachycardic, No M/R/G Respiratory:  Lungs CTAB Gastrointestinal:  Abdomen soft, slightly tender right upper quadrant, no guarding or rebound Extremities:  No C/E/C  Data Reviewed: Basic  Metabolic Panel:  Recent Labs Lab 11/15/12 1313 11/16/12 0420 11/17/12 0355  NA 133* 133* 128*  K 3.4* 3.6 3.7  CL 95* 97 95*  CO2 26 23 23   GLUCOSE 155* 149* 153*  BUN 12 8 7   CREATININE 0.75 0.61 0.61  CALCIUM 10.4 9.6 9.8   GFR Estimated Creatinine Clearance: 57.3 ml/min (by C-G formula based on Cr of 0.61). Liver Function Tests:  Recent Labs Lab 11/15/12 1313 11/16/12 0420 11/17/12 0355  AST 423* 196* 64*  ALT 364* 255* 158*  ALKPHOS 318* 290* 263*  BILITOT 1.3* 0.9 1.0  PROT 8.2 7.7 7.7  ALBUMIN 3.8 3.6 3.5    Recent Labs Lab 11/15/12 1313 11/16/12 0420 11/17/12 0355  LIPASE 2768* 166* 20   CBC:  Recent Labs Lab 11/15/12 1313 11/16/12 0420 11/17/12 0355 11/18/12 0403  WBC 11.9* 12.8* 13.9* 11.5*  NEUTROABS 9.6* 10.4* 11.0*  --   HGB 13.8 13.5 12.6 12.0  HCT 40.2 39.0 36.9 35.1*  MCV 100.2* 100.0 100.5* 100.6*  PLT 274 256 227 192   Thyroid function studies  Recent Labs  11/15/12 1318  TSH 5.297*    Procedures and Diagnostic Studies: Ct Abdomen Pelvis W Contrast  11/15/2012   CLINICAL DATA:  Mid abdominal pain. Nausea, vomiting.  EXAM: CT ABDOMEN AND PELVIS WITH CONTRAST  TECHNIQUE: Multidetector CT imaging of the abdomen and pelvis was performed using the standard protocol following bolus administration of intravenous contrast.  CONTRAST:  OMNIPAQUE IOHEXOL 300 MG/ML  SOLN  COMPARISON:  06/29/2011  FINDINGS: Lung bases are clear. No effusions. Heart is normal size.  Small hiatal hernia. Contrast material is noted within the esophagus which may reflect dysmotility or reflux.  New layering high-density material partially fills the gallbladder, presumably vicarious excretion of contrast. This obscures the previously seen small layering gallstones. Liver, spleen, adrenals are unremarkable. Pancreas is atrophic without focal abnormality. No biliary ductal dilatation. Small cyst in the midpole of the left kidney. Right kidney is unremarkable. No  hydronephrosis.  Postsurgical changes are noted in the right lower quadrant. Again noted are calcifications and abnormal soft tissue adjacent to the anastomotic site. This is unchanged since prior study. Extensive sigmoid diverticulosis. No active diverticulitis. Small bowel is decompressed. Stomach is grossly unremarkable.  Uterus, adnexa and urinary bladder are unremarkable. No free fluid, free air or adenopathy. Aorta is calcified, non aneurysmal.  No acute bony abnormality.  IMPRESSION: Postsurgical changes in the right lower quadrant bowel. Adjacent partially calcified soft tissue is again noted and not significantly changed, presumably postsurgical reaction.  High density material partially fills the gallbladder, presumably vicarious excretion of contrast which obscures the layering stones seen previously.  No acute findings in the abdomen or pelvis.  Sigmoid diverticulosis.   Electronically Signed   By: Charlett Nose M.D.   On: 11/15/2012 16:17   Dg Ercp With Sphincterotomy  11/14/2012   CLINICAL DATA:  Gallstones.  EXAM: ERCP  TECHNIQUE: Multiple spot images obtained with the fluoroscopic device and submitted for interpretation post-procedure.  COMPARISON:  Ultrasound 11/02/2012  FINDINGS: The common bile duct was cannulated and opacified with contrast. There may be partial filling of the gallbladder and cystic duct. Balloon was inflated within the common bile duct for stone removal. No definite filling defects within the common bile duct.  IMPRESSION: Opacification of the common bile duct. No definite  stones were identified on these images.  These images were submitted for radiologic interpretation only. Please see the procedural report for the amount of contrast and the fluoroscopy time utilized.   Electronically Signed   By: Richarda Overlie M.D.   On: 11/14/2012 16:58   Dg Abd Acute W/chest  11/15/2012   CLINICAL DATA:  Epigastric pain status post ERCP.  EXAM: ACUTE ABDOMEN SERIES (ABDOMEN 2 VIEW &  CHEST 1 VIEW)  COMPARISON:  CHEST x-ray 09/14/2010. CT of the abdomen and pelvis 06/29/2011.  FINDINGS: Lung volumes are normal. No consolidative airspace disease. No pleural effusions. No pneumothorax. No pulmonary nodule or mass noted. Pulmonary vasculature and the cardiomediastinal silhouette are within normal limits. Atherosclerosis in the thoracic aorta.  Small amount of contrast material is noted throughout the colon. Gas and stool are seen scattered throughout the colon extending to the level of the distal rectum. No pathologic distension of small bowel is noted. No gross evidence of pneumoperitoneum.  IMPRESSION: 1. Nonobstructive bowel gas pattern. 2. No pneumoperitoneum. 3. No radiographic evidence of acute cardiopulmonary disease. 4. Atherosclerosis.   Electronically Signed   By: Trudie Reed M.D.   On: 11/15/2012 13:54    Scheduled Meds: . amLODipine  5 mg Oral Daily  . calcium-vitamin D  1 tablet Oral BID WC  . estradiol  0.5 mg Oral Q48H  . [START ON 11/19/2012] levothyroxine  125 mcg Oral Custom   And  . levothyroxine  137 mcg Oral Custom  . medroxyPROGESTERone  5 mg Oral Q48H  . multivitamin  1 tablet Oral Daily  . nortriptyline  75 mg Oral q morning - 10a  . pantoprazole  40 mg Oral BID AC  . simvastatin  20 mg Oral QHS  . vitamin B-12  1,000 mcg Oral Daily  . vitamin C  500 mg Oral Daily  . [START ON 11/25/2012] Vitamin D (Ergocalciferol)  50,000 Units Oral Q14 Days  . vitamin E  400 Units Oral BID  . zolpidem  5 mg Oral QHS   Continuous Infusions: . 0.9 % NaCl with KCl 20 mEq / L 50 mL/hr at 11/17/12 1033    Time spent: 25 minutes.    LOS: 3 days   Demichael Traum  [facility] Pager [phone].   *Please note that the hospitalists switch teams on Wednesdays. Please call the flow manager at [phone] if you are having difficulty reaching the hospitalist taking care of this patient as she can update you and provide the most up-to-date pager number of provider  caring for the patient. If 8PM-8AM, please contact night-coverage at [email or web address], password [facility]  11/18/2012, 7:41 AM

## 2012-11-18 NOTE — Evaluation (Signed)
Physical Therapy Evaluation Patient Details Name: [name] MRN: [id number] DOB: February 12, 1932 Today's Date: 11/18/2012 Time: 1478-2956 PT Time Calculation (min): 12 min  PT Assessment / Plan / Recommendation History of Present Illness  79 yo female s/p lap cholecystectomy 11/1  Clinical Impression  On eval, pt required Min guard assist for mobility-able to ambulate ~300 feet. Close guard for safety due to intermittent stumbling. Pt reports she has been ambulating with family member as well. No follow up PT recs at this time. Will follow during stay.     PT Assessment  Patient needs continued PT services    Follow Up Recommendations  No PT follow up    Does the patient have the potential to tolerate intense rehabilitation      Barriers to Discharge        Equipment Recommendations  None recommended by PT    Recommendations for Other Services     Frequency Min 3X/week    Precautions / Restrictions Precautions Precautions: Fall Restrictions Weight Bearing Restrictions: No   Pertinent Vitals/Pain 5/10 R flank "gas"      Mobility  Bed Mobility Bed Mobility: Not assessed Details for Bed Mobility Assistance: pt sitting in recliner Transfers Transfers: Sit to Stand;Stand to Sit Sit to Stand: 5: Supervision;From chair/3-in-1 Stand to Sit: 5: Supervision;To chair/3-in-1 Details for Transfer Assistance: VCs safety Ambulation/Gait Ambulation/Gait Assistance: 4: Min guard Ambulation Distance (Feet): 300 Feet Ambulation/Gait Assistance Details: close guard for safety Gait Pattern: Step-through pattern    Exercises     PT Diagnosis: Difficulty walking  PT Problem List: Decreased mobility;Pain PT Treatment Interventions: Gait training;Functional mobility training;Therapeutic activities;Therapeutic exercise;Patient/family education     PT Goals(Current goals can be found in the care plan section) Acute Rehab PT Goals Patient Stated Goal: home PT Goal  Formulation: With patient/family Time For Goal Achievement: 12/02/12 Potential to Achieve Goals: Good  Visit Information  Last PT Received On: 11/18/12 Assistance Needed: +1 History of Present Illness: 79 yo female s/p lap cholecystectomy 11/1       Prior Functioning  Home Living Family/patient expects to be discharged to:: Private residence Living Arrangements: Children Available Help at Discharge: Family Type of Home: Mobile home Home Access: Stairs to enter Entrance Stairs-Number of Steps: 5 Entrance Stairs-Rails: Right Home Layout: One level Home Equipment: Environmental consultant - 2 wheels;Cane - single point;Crutches;Bedside commode Prior Function Level of Independence: Independent Communication Communication: No difficulties    Cognition  Cognition Arousal/Alertness: Awake/alert Behavior During Therapy: WFL for tasks assessed/performed Overall Cognitive Status: Within Functional Limits for tasks assessed    Extremity/Trunk Assessment Upper Extremity Assessment Upper Extremity Assessment: Overall WFL for tasks assessed Lower Extremity Assessment Lower Extremity Assessment: Overall WFL for tasks assessed Cervical / Trunk Assessment Cervical / Trunk Assessment: Normal   Balance    End of Session PT - End of Session Activity Tolerance: Patient tolerated treatment well Patient left: in chair;with call bell/phone within reach;with family/visitor present  GP     Rebeca Alert, MPT Pager: [phone]

## 2012-11-18 NOTE — Progress Notes (Signed)
1 Day Post-Op Lap Chole Subjective: C/O pain in RUQ, belching some.  Feels better than last night  Objective: Vital signs in last 24 hours: Temp:  [97.8 F (36.6 C)-99.1 F (37.3 C)] 98.5 F (36.9 C) (11/02 0505) Pulse Rate:  [91-109] 97 (11/02 0505) Resp:  [14-19] 18 (11/02 0505) BP: (123-154)/(67-86) 123/69 mmHg (11/02 0505) SpO2:  [97 %-100 %] 97 % (11/02 0505)   Intake/Output from previous day: 11/01 0701 - 11/02 0700 In: 3140 [P.O.:240; I.V.:2900] Out: 1650 [Urine:1650] Intake/Output this shift: Total I/O In: -  Out: 225 [Urine:225]   General appearance: alert and cooperative  GI: normal findings: soft, appropriately tender, mildly distended  Incision: no significant drainage, no significant erythema  Lab Results:   Recent Labs  11/17/12 0355 11/18/12 0403  WBC 13.9* 11.5*  HGB 12.6 12.0  HCT 36.9 35.1*  PLT 227 192   BMET  Recent Labs  11/16/12 0420 11/17/12 0355  NA 133* 128*  K 3.6 3.7  CL 97 95*  CO2 23 23  GLUCOSE 149* 153*  BUN 8 7  CREATININE 0.61 0.61  CALCIUM 9.6 9.8   PT/INR No results found for this basename: LABPROT, INR,  in the last 72 hours ABG No results found for this basename: PHART, PCO2, PO2, HCO3,  in the last 72 hours  MEDS, Scheduled . amLODipine  5 mg Oral Daily  . calcium-vitamin D  1 tablet Oral BID WC  . estradiol  0.5 mg Oral Q48H  . [START ON 11/19/2012] levothyroxine  125 mcg Oral Custom   And  . levothyroxine  137 mcg Oral Custom  . medroxyPROGESTERone  5 mg Oral Q48H  . multivitamin  1 tablet Oral Daily  . nortriptyline  75 mg Oral q morning - 10a  . pantoprazole  40 mg Oral BID AC  . simvastatin  20 mg Oral QHS  . vitamin B-12  1,000 mcg Oral Daily  . vitamin C  500 mg Oral Daily  . [START ON 11/25/2012] Vitamin D (Ergocalciferol)  50,000 Units Oral Q14 Days  . vitamin E  400 Units Oral BID  . zolpidem  5 mg Oral QHS    Studies/Results: No results found.  Assessment: s/p  Procedure(s): LAPAROSCOPIC CHOLECYSTECTOMY Patient Active Problem List   Diagnosis Date Noted  . Protein-calorie malnutrition, severe 11/17/2012  . Hypokalemia 11/16/2012  . Hyponatremia 11/16/2012  . GERD (gastroesophageal reflux disease) 11/16/2012  . Choledocholithiasis with obstruction 11/16/2012  . Calcification of aorta 11/16/2012  . Atherosclerosis 11/16/2012  . DM II (diabetes mellitus, type II), controlled 11/16/2012  . Pancreatitis, acute 11/15/2012  . Hypothyroidism 11/15/2012  . Nonspecific (abnormal) findings on radiological and other examination of gastrointestinal tract 11/08/2012  . S/P right knee replacement 12/22/2010  . Preop cardiovascular exam 12/08/2010  . Hypertension 12/08/2010  . Hyperlipidemia 12/08/2010    S/p lap chole.  Appears to have a slight post op ileus most likely due to lysis of adhesions.  Plan: Advance diet as tolerated Ambulate TID   LOS: 3 days     .Vanita Panda, MD [facility], [place] [phone]   11/18/2012 8:21 AM

## 2012-11-19 ENCOUNTER — Encounter (HOSPITAL_COMMUNITY): Payer: Self-pay | Admitting: General Surgery

## 2012-11-19 DIAGNOSIS — K8051 Calculus of bile duct without cholangitis or cholecystitis with obstruction: Secondary | ICD-10-CM

## 2012-11-19 DIAGNOSIS — E119 Type 2 diabetes mellitus without complications: Secondary | ICD-10-CM

## 2012-11-19 DIAGNOSIS — K8010 Calculus of gallbladder with chronic cholecystitis without obstruction: Secondary | ICD-10-CM | POA: Diagnosis present

## 2012-11-19 LAB — CBC
HCT: 31.5 % — ABNORMAL LOW (ref 36.0–46.0)
Hemoglobin: 10.7 g/dL — ABNORMAL LOW (ref 12.0–15.0)
MCH: 34.2 pg — ABNORMAL HIGH (ref 26.0–34.0)
MCV: 100.6 fL — ABNORMAL HIGH (ref 78.0–100.0)
Platelets: 192 10*3/uL (ref 150–400)
RBC: 3.13 MIL/uL — ABNORMAL LOW (ref 3.87–5.11)

## 2012-11-19 LAB — BASIC METABOLIC PANEL
BUN: 7 mg/dL (ref 6–23)
CO2: 24 mEq/L (ref 19–32)
Chloride: 98 mEq/L (ref 96–112)
GFR calc non Af Amer: 85 mL/min — ABNORMAL LOW (ref >90)
Glucose, Bld: 138 mg/dL — ABNORMAL HIGH (ref 70–99)
Potassium: 3.9 mEq/L (ref 3.5–5.1)
Sodium: 129 mEq/L — ABNORMAL LOW (ref 135–145)

## 2012-11-19 MED ORDER — SACCHAROMYCES BOULARDII 250 MG PO CAPS
250.0000 mg | ORAL_CAPSULE | Freq: Two times a day (BID) | ORAL | Status: DC
  Administered 2012-11-19: 250 mg via ORAL
  Filled 2012-11-19 (×2): qty 1

## 2012-11-19 MED ORDER — OXYCODONE HCL 5 MG PO TABS: 5.0000 mg | ORAL_TABLET | Freq: Four times a day (QID) | ORAL | Status: DC | PRN

## 2012-11-19 MED ORDER — ACETAMINOPHEN 650 MG RE SUPP: 650.0000 mg | Freq: Four times a day (QID) | RECTAL | Status: DC | PRN

## 2012-11-19 MED ORDER — SODIUM CHLORIDE 0.9 % IJ SOLN
3.0000 mL | Freq: Two times a day (BID) | INTRAMUSCULAR | Status: DC
  Administered 2012-11-19: 3 mL via INTRAVENOUS

## 2012-11-19 MED ORDER — POLYETHYLENE GLYCOL 3350 17 G PO PACK
17.0000 g | PACK | Freq: Two times a day (BID) | ORAL | Status: DC
  Filled 2012-11-19 (×2): qty 1

## 2012-11-19 MED ORDER — ACETAMINOPHEN 325 MG PO TABS: 325.0000 mg | ORAL_TABLET | Freq: Four times a day (QID) | ORAL | Status: DC | PRN

## 2012-11-19 MED ORDER — SODIUM CHLORIDE 0.9 % IJ SOLN: 3.0000 mL | INTRAMUSCULAR | Status: DC | PRN

## 2012-11-19 MED ORDER — LACTATED RINGERS IV BOLUS (SEPSIS): 1000.0000 mL | Freq: Three times a day (TID) | INTRAVENOUS | Status: DC | PRN

## 2012-11-19 MED ORDER — LIP MEDEX EX OINT
1.0000 "application " | TOPICAL_OINTMENT | Freq: Two times a day (BID) | CUTANEOUS | Status: DC
  Administered 2012-11-19: 1 via TOPICAL
  Filled 2012-11-19: qty 7

## 2012-11-19 MED ORDER — HYDROCODONE-ACETAMINOPHEN 5-325 MG PO TABS: 1.0000 | ORAL_TABLET | Freq: Four times a day (QID) | ORAL | Status: DC | PRN

## 2012-11-19 MED ORDER — MAGIC MOUTHWASH
15.0000 mL | Freq: Four times a day (QID) | ORAL | Status: DC | PRN
  Filled 2012-11-19: qty 15

## 2012-11-19 MED ORDER — ALUM & MAG HYDROXIDE-SIMETH 200-200-20 MG/5ML PO SUSP: 30.0000 mL | Freq: Four times a day (QID) | ORAL | Status: DC | PRN

## 2012-11-19 NOTE — Discharge Summary (Signed)
Physician Discharge Summary  [name]:[id number] DOB: 06/29/1932 DOA: 11/15/2012  PCP: [name], MD  Admit date: 11/15/2012 Discharge date: 11/19/2012  Time spent: 25 minutes  Recommendations for Outpatient Follow-up:  1. Patient will follow up with her PCP in the next 2 weeks 2. Patient will follow up with central, surgery in the next 2 weeks 3. Patient given a prescription for Percocet one by mouth every 6 hours when necessary for pain  Discharge Diagnoses:  Principal Problem:   Pancreatitis, acute Active Problems:   Hypertension   Hypothyroidism   Hypokalemia   Hyponatremia   GERD (gastroesophageal reflux disease)   Choledocholithiasis with obstruction   Calcification of aorta   Atherosclerosis   DM II (diabetes mellitus, type II), controlled   Protein-calorie malnutrition, severe   Chronic cholecystitis with calculus s/p lap chole 11/17/2012   Discharge Condition: Improved, being discharged home  Diet recommendation: Low-sodium, carb modified  Filed Weights   11/15/12 1638  Weight: 62.143 kg (137 lb)    History of present illness:  Patient is a 79 year old white female past medical history hypertension, hypothyroidism and colon cancer who is status post an ERCP for presumed common bile duct stones and admitted post procedure for treatment of mild pancreatitis.  Hospital Course:  Principal Problem:   Pancreatitis, acute: Felt to be secondary to blocked common bile duct stone. Status post ERCP on 10/29. Patient was made n.p.o. and given medication for pain, nausea plus IV fluids. She seen by general surgery and underwent cholecystectomy on 11/2. Postop she tolerated well. By 11/3, she is having positive bowel movements, tolerating solid food by mouth, ambulating well and felt to be medically stable for discharge. She felt general surgery in 2 weeks. Active Problems:   Hypertension: Stable. Improved status post surgery and with pain control.     Hypothyroidism: Stable. Continue home dose of Synthroid.    Hypokalemia: Mild secondary to dehydration. Replaced. Potassium on day of discharge is 3.9    Hyponatremia: Mild felt to be secondary to dehydration. Given IV fluids. By day of discharge, sodium was at 129   GERD (gastroesophageal reflux disease): Continue PPI.    Choledocholithiasis with obstruction: See above.    Calcification of aorta   Atherosclerosis   DM II (diabetes mellitus, type II), controlled   Protein-calorie malnutrition, severe   Chronic cholecystitis with calculus s/p lap chole 11/17/2012   Procedures:  Status post cholecystectomy done 11/2  Consultations: Dr. Bernette Redbird, Gastroenterology  Dr. Romie Levee, Surgery.   Discharge Exam: Filed Vitals:   11/19/12 1325  BP: 110/54  Pulse: 91  Temp: 97.8 F (36.6 C)  Resp: 16    General: Alert and oriented x3, no acute distress Cardiovascular: Regular rate and rhythm, S1-S2 Respiratory: Clear to auscultation bilaterally Abdomen: Soft, minimal tenderness in the right upper quadrant, positive bowel sounds, nondistended, Extremities: No clubbing or cyanosis or edema  Discharge Instructions  Discharge Orders   Future Appointments Provider Department Dept Phone   12/03/2012 10:45 AM [facility] [place]  IMAGING [phone]   Patient should wear two piece clothing and wear no powder or deodorant. Patient should arrive 15 minutes early.   07/02/2013 9:15 AM Windell Hummingbird [facility] MEDICAL ONCOLOGY [phone]   07/02/2013 9:45 AM Myrtis Ser, NP [facility] MEDICAL ONCOLOGY [phone]   07/29/2013 9:45 AM Lauro Franklin, FNP [facility] [phone]   Future Orders Complete By Expires   Diet -  low sodium heart healthy  As directed    Increase activity slowly  As directed        Medication List         acetaminophen 500 MG tablet  Commonly known as:   TYLENOL  Take 500 mg by mouth every 6 (six) hours as needed.     amLODipine 5 MG tablet  Commonly known as:  NORVASC  Take 5 mg by mouth every morning.     aspirin 325 MG tablet  Take 325 mg by mouth at bedtime.     CALTRATE 600 PLUS-VIT D PO  Take 1 tablet by mouth 2 (two) times daily.     docusate sodium 100 MG capsule  Commonly known as:  COLACE  Take 100 mg by mouth 3 (three) times daily as needed for constipation.     estradiol 0.5 MG tablet  Commonly known as:  ESTRACE  Take 0.5 tablets (0.25 mg total) by mouth daily.     FORTEO [place]  Inject 20 mcg into the skin daily.     HYDROcodone-acetaminophen 5-325 MG per tablet  Commonly known as:  NORCO/VICODIN  Take 1 tablet by mouth every 6 (six) hours as needed for pain.     medroxyPROGESTERone 5 MG tablet  Commonly known as:  PROVERA  Take 0.5 tablets (2.5 mg total) by mouth daily. 2.5 mg tab daily     MULTI COMPLETE PO  Take by mouth daily.     niacin 1000 MG CR tablet  Commonly known as:  NIASPAN  Take 1,000 mg by mouth at bedtime.     nortriptyline 25 MG capsule  Commonly known as:  PAMELOR  Take 75 mg by mouth every morning.     omeprazole 40 MG capsule  Commonly known as:  PRILOSEC  Take 40 mg by mouth 2 (two) times daily.     simvastatin 20 MG tablet  Commonly known as:  ZOCOR  Take 20 mg by mouth at bedtime.     SYNTHROID 125 MCG tablet  Generic drug:  levothyroxine  Take 125 mcg by mouth See admin instructions. Monday thru Thursday     SYNTHROID 137 MCG tablet  Generic drug:  levothyroxine  Take 137 mcg by mouth See admin instructions. Friday thru Sunday     vitamin B-12 1000 MCG tablet  Commonly known as:  CYANOCOBALAMIN  Take 1,000 mcg by mouth daily.     vitamin C 500 MG tablet  Commonly known as:  ASCORBIC ACID  Take 500 mg by mouth daily.     Vitamin D (Ergocalciferol) 50000 UNITS Caps capsule  Commonly known as:  DRISDOL  Take 1 capsule (50,000 Units total) by mouth every 14 (fourteen)  days.     vitamin E 400 UNIT capsule  Take 400 Units by mouth 2 (two) times daily.     zolpidem 10 MG tablet  Commonly known as:  AMBIEN  Take 10 mg by mouth at bedtime.       Allergies  Allergen Reactions  . Tape Rash    Blisters, nausea, and vomitting  . Latex Other (See Comments)    blister  . Lidocaine Nausea And Vomiting  . Sulfur Nausea And Vomiting       Follow-up Information   Follow up with [name], MD In 2 weeks.   Specialty:  Family Medicine   Contact information:   [address] [place] [address] [phone]       Follow up with CCS,MD, MD  In 2 weeks.   Specialty:  General Surgery   Contact information:   [address] [place] [address] [phone]        The results of significant diagnostics from this hospitalization (including imaging, microbiology, ancillary and laboratory) are listed below for reference.    Significant Diagnostic Studies: US Abdomen Complete  11/02/2012   CLINICAL DATA:  Abdominal pain.  History of colon cancer.  EXAM: ULTRASOUND ABDOMEN COMPLETE  COMPARISON:  CT scan dated 06/29/2011  FINDINGS: Gallbladder  Multiple gallstones. Gallbladder wall is not thickened. Negative sonographic Murphy's sign.  Common bile duct  Diameter: 11.4 mm, enlarged.  No visible mass or stone.  Liver  No focal lesions. Slight intrahepatic ductal dilatation.  IVC  Normal.  Pancreas  Normal.  Spleen  Normal. 4.1 cm in length.  Right Kidney  Length: 9.8 cm. Echogenicity within normal limits. No mass or hydronephrosis visualized.  Left Kidney  Length: 9.6 cm. Echogenicity within normal limits. No mass or hydronephrosis visualized.  Abdominal aorta  Maximum diameter 2.8 cm.  IMPRESSION: Multiple gallstones, unchanged. New dilatation of the common bile duct suggesting the possibility of a distal common bile duct stone or obstructing mass.   Electronically Signed   By: Geanie Cooley M.D.   On: 11/02/2012 10:40   Ct Abdomen  Pelvis W Contrast  11/15/2012   CLINICAL DATA:  Mid abdominal pain. Nausea, vomiting.  EXAM: CT ABDOMEN AND PELVIS WITH CONTRAST  TECHNIQUE: Multidetector CT imaging of the abdomen and pelvis was performed using the standard protocol following bolus administration of intravenous contrast.  CONTRAST:  OMNIPAQUE IOHEXOL 300 MG/ML  SOLN  COMPARISON:  06/29/2011  FINDINGS: Lung bases are clear. No effusions. Heart is normal size.  Small hiatal hernia. Contrast material is noted within the esophagus which may reflect dysmotility or reflux.  New layering high-density material partially fills the gallbladder, presumably vicarious excretion of contrast. This obscures the previously seen small layering gallstones. Liver, spleen, adrenals are unremarkable. Pancreas is atrophic without focal abnormality. No biliary ductal dilatation. Small cyst in the midpole of the left kidney. Right kidney is unremarkable. No hydronephrosis.  Postsurgical changes are noted in the right lower quadrant. Again noted are calcifications and abnormal soft tissue adjacent to the anastomotic site. This is unchanged since prior study. Extensive sigmoid diverticulosis. No active diverticulitis. Small bowel is decompressed. Stomach is grossly unremarkable.  Uterus, adnexa and urinary bladder are unremarkable. No free fluid, free air or adenopathy. Aorta is calcified, non aneurysmal.  No acute bony abnormality.  IMPRESSION: Postsurgical changes in the right lower quadrant bowel. Adjacent partially calcified soft tissue is again noted and not significantly changed, presumably postsurgical reaction.  High density material partially fills the gallbladder, presumably vicarious excretion of contrast which obscures the layering stones seen previously.  No acute findings in the abdomen or pelvis.  Sigmoid diverticulosis.   Electronically Signed   By: Charlett Nose M.D.   On: 11/15/2012 16:17   Dg Ercp With Sphincterotomy  11/14/2012   CLINICAL DATA:   Gallstones.  EXAM: ERCP  TECHNIQUE: Multiple spot images obtained with the fluoroscopic device and submitted for interpretation post-procedure.  COMPARISON:  Ultrasound 11/02/2012  FINDINGS: The common bile duct was cannulated and opacified with contrast. There may be partial filling of the gallbladder and cystic duct. Balloon was inflated within the common bile duct for stone removal. No definite filling defects within the common bile duct.  IMPRESSION: Opacification of the common bile duct. No definite stones  were identified on these images.  These images were submitted for radiologic interpretation only. Please see the procedural report for the amount of contrast and the fluoroscopy time utilized.   Electronically Signed   By: Richarda Overlie M.D.   On: 11/14/2012 16:58   Dg Abd Acute W/chest  11/15/2012   CLINICAL DATA:  Epigastric pain status post ERCP.  EXAM: ACUTE ABDOMEN SERIES (ABDOMEN 2 VIEW & CHEST 1 VIEW)  COMPARISON:  CHEST x-ray 09/14/2010. CT of the abdomen and pelvis 06/29/2011.  FINDINGS: Lung volumes are normal. No consolidative airspace disease. No pleural effusions. No pneumothorax. No pulmonary nodule or mass noted. Pulmonary vasculature and the cardiomediastinal silhouette are within normal limits. Atherosclerosis in the thoracic aorta.  Small amount of contrast material is noted throughout the colon. Gas and stool are seen scattered throughout the colon extending to the level of the distal rectum. No pathologic distension of small bowel is noted. No gross evidence of pneumoperitoneum.  IMPRESSION: 1. Nonobstructive bowel gas pattern. 2. No pneumoperitoneum. 3. No radiographic evidence of acute cardiopulmonary disease. 4. Atherosclerosis.   Electronically Signed   By: Trudie Reed M.D.   On: 11/15/2012 13:54    Microbiology: Recent Results (from the past 240 hour(s))  SURGICAL PCR SCREEN     Status: None   Collection Time    11/17/12  4:40 AM      Result Value Range Status   MRSA,  PCR NEGATIVE  NEGATIVE Final   Staphylococcus aureus NEGATIVE  NEGATIVE Final   Comment:            The Xpert SA Assay (FDA     approved for NASAL specimens     in patients over 32 years of age),     is one component of     a comprehensive surveillance     program.  Test performance has     been validated by [employer] for patients greater     than or equal to 35 year old.     It is not intended     to diagnose infection nor to     guide or monitor treatment.     Labs: Basic Metabolic Panel:  Recent Labs Lab 11/15/12 1313 11/16/12 0420 11/17/12 0355 11/18/12 0825 11/19/12 0415  NA 133* 133* 128* 128* 129*  K 3.4* 3.6 3.7 3.4* 3.9  CL 95* 97 95* 96 98  CO2 26 23 23 23 24   GLUCOSE 155* 149* 153* 236* 138*  BUN 12 8 7 11 7   CREATININE 0.75 0.61 0.61 0.66 0.62  CALCIUM 10.4 9.6 9.8 8.7 9.1   Liver Function Tests:  Recent Labs Lab 11/15/12 1313 11/16/12 0420 11/17/12 0355 11/18/12 0825  AST 423* 196* 64* 54*  ALT 364* 255* 158* 109*  ALKPHOS 318* 290* 263* 203*  BILITOT 1.3* 0.9 1.0 0.6  PROT 8.2 7.7 7.7 6.3  ALBUMIN 3.8 3.6 3.5 2.6*    Recent Labs Lab 11/15/12 1313 11/16/12 0420 11/17/12 0355  LIPASE 2768* 166* 20   No results found for this basename: AMMONIA,  in the last 168 hours CBC:  Recent Labs Lab 11/15/12 1313 11/16/12 0420 11/17/12 0355 11/18/12 0403 11/18/12 0825 11/19/12 0415  WBC 11.9* 12.8* 13.9* 11.5* 11.5* 9.2  NEUTROABS 9.6* 10.4* 11.0*  --   --   --   HGB 13.8 13.5 12.6 12.0 11.4* 10.7*  HCT 40.2 39.0 36.9 35.1* 33.6* 31.5*  MCV 100.2* 100.0 100.5* 100.6* 100.9* 100.6*  PLT 274 256 227 192 212 192   Cardiac Enzymes: No results found for this basename: CKTOTAL, CKMB, CKMBINDEX, TROPONINI,  in the last 168 hours BNP: BNP (last 3 results) No results found for this basename: PROBNP,  in the last 8760 hours CBG: No results found for this basename: GLUCAP,  in the last 168 hours     Signed:  Hollice Espy  [facility] 11/19/2012, 3:43 PM

## 2012-11-19 NOTE — Progress Notes (Signed)
Physical Therapy Treatment Patient Details Name: SAFINA HUARD MRN: [id number] DOB: [birth year] Today's Date: 11/19/2012 Time: 1131-1140 PT Time Calculation (min): 9 min  PT Assessment / Plan / Recommendation  History of Present Illness 79 yo female s/p lap cholecystectomy 11/1   PT Comments   *Pt ambulated 400' independently, no LOB. OK to DC home from PT standpoint. PT goals met, DC PT. No post-acute PT needs. **  Follow Up Recommendations  No PT follow up     Does the patient have the potential to tolerate intense rehabilitation     Barriers to Discharge        Equipment Recommendations  None recommended by PT    Recommendations for Other Services    Frequency Min 3X/week   Progress towards PT Goals Progress towards PT goals: Goals met/education completed, patient discharged from PT  Plan Current plan remains appropriate    Precautions / Restrictions Restrictions Weight Bearing Restrictions: No   Pertinent Vitals/Pain *7/10 incisional pain RN notified**    Mobility  Bed Mobility Bed Mobility: Not assessed Details for Bed Mobility Assistance: pt sitting in recliner Transfers Transfers: Sit to Stand;Stand to Sit Sit to Stand: From chair/3-in-1;6: Modified independent (Device/Increase time);With upper extremity assist Stand to Sit: To chair/3-in-1;6: Modified independent (Device/Increase time);With upper extremity assist Ambulation/Gait Ambulation/Gait Assistance: 7: Independent Ambulation Distance (Feet): 400 Feet Assistive device: None Gait Pattern: Step-through pattern Gait velocity: WFL General Gait Details: neck forward flexed. 7/10 incisional pain while walking, pain meds requested.    Exercises     PT Diagnosis:    PT Problem List:   PT Treatment Interventions:     PT Goals (current goals can now be found in the care plan section) Acute Rehab PT Goals Patient Stated Goal: cooking PT Goal Formulation: With patient/family Time For Goal Achievement:  12/02/12 Potential to Achieve Goals: Good  Visit Information  Last PT Received On: 11/19/12 Assistance Needed: +1 History of Present Illness: 79 yo female s/p lap cholecystectomy 11/1    Subjective Data  Patient Stated Goal: cooking   Cognition  Cognition Arousal/Alertness: Awake/alert Behavior During Therapy: WFL for tasks assessed/performed Overall Cognitive Status: Within Functional Limits for tasks assessed    Balance     End of Session PT - End of Session Activity Tolerance: Patient tolerated treatment well Patient left: in chair;with call bell/phone within reach;with family/visitor present Nurse Communication: Mobility status   GP     Ralene Bathe Kistler 11/19/2012, 11:46 AM [phone]

## 2012-11-19 NOTE — Progress Notes (Signed)
Patient's son Mariella Saa told this Clinical research associate than he can scehdule mother's appointment in 2 weeks with [employer] [facility] surgery.Hulda Marin RN

## 2012-11-19 NOTE — Progress Notes (Signed)
[name] [id number] Mar 29, 1932  CARE TEAM:  PCP: [name], MD  Outpatient Care Team: Patient Care Team: [name], MD as PCP - General (Family Medicine) Petra Kuba, MD as Consulting Physician (Gastroenterology)  Inpatient Treatment Team: Treatment Team: Attending Provider: Hollice Espy, MD; Consulting Physician: Petra Kuba, MD; Registered Nurse: Eugene Garnet, RN; Rounding Team: Alton Revere, MD; Technician: Betsey Holiday, NT; Consulting Physician: Bishop Limbo, MD; Dietitian: Lavena Bullion, RD; Technician: Gwynneth Albright, NT; Registered Nurse: York Grice, RN; Physical Therapist: Alvester Morin, PT   Subjective:  Feeling OK +flatus but small BM yest - trying to use her daily prune juice Walking 3x Denies using pain meds - mild RUQ soreness  Objective:  Vital signs:  Filed Vitals:   11/18/12 0918 11/18/12 1425 11/18/12 2044 11/19/12 0429  BP: 132/95 126/60 119/69 131/75  Pulse:  101 96 98  Temp:  97.6 F (36.4 C) 98.5 F (36.9 C) 98.5 F (36.9 C)  TempSrc:  Oral Oral Oral  Resp:  18 18 16   Height:      Weight:      SpO2:  97% 97% 98%    Last BM Date: 11/13/12  Intake/Output   Yesterday:  11/02 0701 - 11/03 0700 In: 1855 [P.O.:720; I.V.:1135] Out: 1775 [Urine:1775] This shift:     Bowel function:  Flatus: y  BM: ?small  Drain: n/a  Physical Exam:  General: Pt awake/alert/oriented x4 in no acute distress Eyes: PERRL, normal EOM.  Sclera clear.  No icterus Neuro: CN II-XII intact w/o focal sensory/motor deficits. Lymph: No head/neck/groin lymphadenopathy Psych:  No delerium/psychosis/paranoia.  Grumpy/tired but consolable HENT: Normocephalic, Mucus membranes moist.  No thrush Neck: Supple, No tracheal deviation Chest: No chest wall pain w good excursion CV:  Pulses intact.  Regular rhythm MS: Normal AROM mjr joints.  No obvious deformity Abdomen: Soft.  Nondistended.  Mildly tender at incisions only.  No  evidence of peritonitis.  No incarcerated hernias. Ext:  SCDs BLE.  No mjr edema.  No cyanosis Skin: No petechiae / purpura   Problem List:   Principal Problem:   Pancreatitis, acute Active Problems:   Hypertension   Hypothyroidism   Hypokalemia   Hyponatremia   GERD (gastroesophageal reflux disease)   Choledocholithiasis with obstruction   Calcification of aorta   Atherosclerosis   DM II (diabetes mellitus, type II), controlled   Protein-calorie malnutrition, severe   Chronic cholecystitis with calculus s/p lap chole 11/17/2012   Assessment  [name]  79 y.o. female  2 Days Post-Op  Procedure(s): LAPAROSCOPIC CHOLECYSTECTOMY  Ileus resolving  Plan:  -adv diet  -d/c IVF -bowel regimen w Miralax inc to BID, prune juice PRN -VTE prophylaxis- SCDs, etc -mobilize as tolerated to help recovery  D/C patient from hospital from surgery standpointwhen patient meets criteria (anticipate in 1 day):  Tolerating oral intake well Ambulating in walkways Adequate pain control without IV medications Urinating  Having flatus   Ardeth Sportsman, M.D., F.A.C.S. Gastrointestinal and Minimally Invasive Surgery [facility] [address] [place], [place] [address] [phone] Main / Paging   11/19/2012   Results:   Labs: Results for orders placed during the hospital encounter of 11/15/12 (from the past 48 hour(s))  CBC     Status: Abnormal   Collection Time    11/18/12  4:03 AM      Result Value Range   WBC 11.5 (*) 4.0 -  10.5 K/uL   RBC 3.49 (*) 3.87 - 5.11 MIL/uL   Hemoglobin 12.0  12.0 - 15.0 g/dL   HCT 16.1 (*) 09.6 - 04.5 %   MCV 100.6 (*) 78.0 - 100.0 fL   MCH 34.4 (*) 26.0 - 34.0 pg   MCHC 34.2  30.0 - 36.0 g/dL   RDW 40.9  81.1 - 91.4 %   Platelets 192  150 - 400 K/uL  COMPREHENSIVE METABOLIC PANEL     Status: Abnormal   Collection Time    11/18/12  8:25 AM      Result Value Range   Sodium 128 (*) 135 - 145  mEq/L   Potassium 3.4 (*) 3.5 - 5.1 mEq/L   Chloride 96  96 - 112 mEq/L   CO2 23  19 - 32 mEq/L   Glucose, Bld 236 (*) 70 - 99 mg/dL   BUN 11  6 - 23 mg/dL   Creatinine, Ser 7.82  0.50 - 1.10 mg/dL   Calcium 8.7  8.4 - 95.6 mg/dL   Total Protein 6.3  6.0 - 8.3 g/dL   Albumin 2.6 (*) 3.5 - 5.2 g/dL   AST 54 (*) 0 - 37 U/L   ALT 109 (*) 0 - 35 U/L   Alkaline Phosphatase 203 (*) 39 - 117 U/L   Total Bilirubin 0.6  0.3 - 1.2 mg/dL   GFR calc non Af Amer 83 (*) >90 mL/min   GFR calc Af Amer >90  >90 mL/min   Comment: (NOTE)     The eGFR has been calculated using the CKD EPI equation.     This calculation has not been validated in all clinical situations.     eGFR's persistently <90 mL/min signify possible Chronic Kidney     Disease.  CBC     Status: Abnormal   Collection Time    11/18/12  8:25 AM      Result Value Range   WBC 11.5 (*) 4.0 - 10.5 K/uL   RBC 3.33 (*) 3.87 - 5.11 MIL/uL   Hemoglobin 11.4 (*) 12.0 - 15.0 g/dL   HCT 21.3 (*) 08.6 - 57.8 %   MCV 100.9 (*) 78.0 - 100.0 fL   MCH 34.2 (*) 26.0 - 34.0 pg   MCHC 33.9  30.0 - 36.0 g/dL   RDW 46.9  62.9 - 52.8 %   Platelets 212  150 - 400 K/uL  BASIC METABOLIC PANEL     Status: Abnormal   Collection Time    11/19/12  4:15 AM      Result Value Range   Sodium 129 (*) 135 - 145 mEq/L   Potassium 3.9  3.5 - 5.1 mEq/L   Chloride 98  96 - 112 mEq/L   CO2 24  19 - 32 mEq/L   Glucose, Bld 138 (*) 70 - 99 mg/dL   BUN 7  6 - 23 mg/dL   Creatinine, Ser 4.13  0.50 - 1.10 mg/dL   Calcium 9.1  8.4 - 24.4 mg/dL   GFR calc non Af Amer 85 (*) >90 mL/min   GFR calc Af Amer >90  >90 mL/min   Comment: (NOTE)     The eGFR has been calculated using the CKD EPI equation.     This calculation has not been validated in all clinical situations.     eGFR's persistently <90 mL/min signify possible Chronic Kidney     Disease.  CBC     Status: Abnormal   Collection Time  11/19/12  4:15 AM      Result Value Range   WBC 9.2  4.0 - 10.5 K/uL    RBC 3.13 (*) 3.87 - 5.11 MIL/uL   Hemoglobin 10.7 (*) 12.0 - 15.0 g/dL   HCT 96.0 (*) 45.4 - 09.8 %   MCV 100.6 (*) 78.0 - 100.0 fL   MCH 34.2 (*) 26.0 - 34.0 pg   MCHC 34.0  30.0 - 36.0 g/dL   RDW 11.9  14.7 - 82.9 %   Platelets 192  150 - 400 K/uL    Imaging / Studies: No results found.  Medications / Allergies: per chart  Antibiotics: Anti-infectives   Start     Dose/Rate Route Frequency Ordered Stop   11/17/12 0600  cefOXitin (MEFOXIN) 2 g in dextrose 5 % 50 mL IVPB     2 g 100 mL/hr over 30 Minutes Intravenous On call to O.R. 11/17/12 5621 11/17/12 3086

## 2012-11-19 NOTE — Accreditation Note (Signed)
Patient d/c instructions given, verbalized understanding.Patient d/c home at 1635,stable and denies pain.Hulda Marin RN

## 2012-12-03 ENCOUNTER — Ambulatory Visit
Admission: RE | Admit: 2012-12-03 | Discharge: 2012-12-03 | Disposition: A | Discharge status: Home / Self Care | Payer: Medicare Other | Source: Ambulatory Visit | Attending: Nurse Practitioner | Admitting: Nurse Practitioner

## 2012-12-03 DIAGNOSIS — N6310 Unspecified lump in the right breast, unspecified quadrant: Secondary | ICD-10-CM

## 2012-12-07 ENCOUNTER — Ambulatory Visit (INDEPENDENT_AMBULATORY_CARE_PROVIDER_SITE_OTHER): Payer: Medicare Other | Admitting: Surgery

## 2012-12-07 ENCOUNTER — Encounter (INDEPENDENT_AMBULATORY_CARE_PROVIDER_SITE_OTHER): Payer: Self-pay | Admitting: Surgery

## 2012-12-07 VITALS — BP 114/58 | HR 80 | Temp 98.7°F | Resp 15 | Ht 66.5 in | Wt 138.2 lb

## 2012-12-07 DIAGNOSIS — K8010 Calculus of gallbladder with chronic cholecystitis without obstruction: Secondary | ICD-10-CM

## 2012-12-07 NOTE — Patient Instructions (Signed)
Acute Pancreatitis °Acute pancreatitis is a disease in which the pancreas becomes suddenly inflamed. The pancreas is a large gland located behind your stomach. The pancreas produces enzymes that help digest food. The pancreas also releases the hormones glucagon and insulin that help regulate blood sugar. Damage to the pancreas occurs when the digestive enzymes from the pancreas are activated and begin attacking the pancreas before being released into the intestine. Most acute attacks last a couple of days and can cause serious complications. Some people become dehydrated and develop low blood pressure. In severe cases, bleeding into the pancreas can lead to shock and can be life-threatening. The lungs, heart, and kidneys may fail. °CAUSES  °Pancreatitis can happen to anyone. In some cases, the cause is unknown. Most cases are caused by: °· Alcohol abuse. °· Gallstones. °Other less common causes are: °· Certain medicines. °· Exposure to certain chemicals. °· Infection. °· Damage caused by an accident (trauma). °· Abdominal surgery. °SYMPTOMS  °· Pain in the upper abdomen that may radiate to the back. °· Tenderness and swelling of the abdomen. °· Nausea and vomiting. °DIAGNOSIS  °Your caregiver will perform a physical exam. Blood and stool tests may be done to confirm the diagnosis. Imaging tests may also be done, such as X-rays, CT scans, or an ultrasound of the abdomen. °TREATMENT  °Treatment usually requires a stay in the hospital. Treatment may include: °· Pain medicine. °· Fluid replacement through an intravenous line (IV). °· Placing a tube in the stomach to remove stomach contents and control vomiting. °· Not eating for 3 or 4 days. This gives your pancreas a rest, because enzymes are not being produced that can cause further damage. °· Antibiotic medicines if your condition is caused by an infection. °· Surgery of the pancreas or gallbladder. °HOME CARE INSTRUCTIONS  °· Follow the diet advised by your  caregiver. This may involve avoiding alcohol and decreasing the amount of fat in your diet. °· Eat smaller, more frequent meals. This reduces the amount of digestive juices the pancreas produces. °· Drink enough fluids to keep your urine clear or pale yellow. °· Only take over-the-counter or prescription medicines as directed by your caregiver. °· Avoid drinking alcohol if it caused your condition. °· Do not smoke. °· Get plenty of rest. °· Check your blood sugar at home as directed by your caregiver. °· Keep all follow-up appointments as directed by your caregiver. °SEEK MEDICAL CARE IF:  °· You do not recover as quickly as expected. °· You develop new or worsening symptoms. °· You have persistent pain, weakness, or nausea. °· You recover and then have another episode of pain. °SEEK IMMEDIATE MEDICAL CARE IF:  °· You are unable to eat or keep fluids down. °· Your pain becomes severe. °· You have a fever or persistent symptoms for more than 2 to 3 days. °· You have a fever and your symptoms suddenly get worse. °· Your skin or the white part of your eyes turn yellow (jaundice). °· You develop vomiting. °· You feel dizzy, or you faint. °· Your blood sugar is high (over 300 mg/dL). °MAKE SURE YOU:  °· Understand these instructions. °· Will watch your condition. °· Will get help right away if you are not doing well or get worse. °Document Released: 01/03/2005 Document Revised: 07/05/2011 Document Reviewed: 04/14/2011 °ExitCare® Patient Information ©2014 [employer]. ° °

## 2012-12-07 NOTE — Progress Notes (Signed)
Juluis Pitch 79 y.o.  Body mass index is 21.97 kg/(m^2).  Patient Active Problem List   Diagnosis Date Noted  . Chronic cholecystitis with calculus s/p lap chole 11/17/2012 11/19/2012  . Protein-calorie malnutrition, severe 11/17/2012  . Hypokalemia 11/16/2012  . Hyponatremia 11/16/2012  . GERD (gastroesophageal reflux disease) 11/16/2012  . Choledocholithiasis with obstruction 11/16/2012  . Calcification of aorta 11/16/2012  . Atherosclerosis 11/16/2012  . DM II (diabetes mellitus, type II), controlled 11/16/2012  . Pancreatitis, acute 11/15/2012  . Hypothyroidism 11/15/2012  . Nonspecific (abnormal) findings on radiological and other examination of gastrointestinal tract 11/08/2012  . S/P right knee replacement 12/22/2010  . Preop cardiovascular exam 12/08/2010  . Hypertension 12/08/2010  . Hyperlipidemia 12/08/2010    Allergies  Allergen Reactions  . Tape Rash    Blisters, nausea, and vomitting  . Latex Other (See Comments)    blister  . Lidocaine Nausea And Vomiting  . Sulfur Nausea And Vomiting    Past Surgical History  Procedure Laterality Date  . Tmj arthroplasty  1990's  . Appendectomy    . Colon resection  2003  . Eye surgery      bilateral cataract surgery   . Total knee arthroplasty  12/20/2010    Procedure: TOTAL KNEE ARTHROPLASTY;  Surgeon: Shelda Pal;  Location: [facility];  Service: Orthopedics;  Laterality: Right;  . Colonoscopy w/ biopsies  09/25/2002, 09/18/2001  . Upper gastrointestinal endoscopy  09/18/2001  . Porta catheter placement  10/30/2001  . Porta catheter removal  06/27/2003  . Tmj arthroplasty    . Dilation and curettage of uterus    . Hysteroscopy    . Esophagogastroduodenoscopy endoscopy    . Vertebroplasty  2012  . Back surgery  2000    lower  . Tonsillectomy  age 25 or 8  . Joint replacement Right dec 2012    knee  . Ercp N/A 11/14/2012    Procedure: ENDOSCOPIC RETROGRADE CHOLANGIOPANCREATOGRAPHY (ERCP);  Surgeon: Petra Kuba, MD;   Location: [facility];  Service: Endoscopy;  Laterality: N/A;  . Cholecystectomy N/A 11/17/2012    Procedure: LAPAROSCOPIC CHOLECYSTECTOMY;  Surgeon: Romie Levee, MD;  Location: [facility];  Service: General;  Laterality: N/A;   [name], MD No diagnosis found.  Incisions have healed very well.  Not having any complaints referable to her cholecystectomy.  I recommended that she restart vitamins and drink a protein supplement as she is fairly thin.   Return PRN.  Matt B. Daphine Deutscher, MD, [facility] [phone] beeper [phone]  12/07/2012 4:46 PM

## 2012-12-12 ENCOUNTER — Telehealth: Payer: Self-pay | Admitting: *Deleted

## 2012-12-12 NOTE — Telephone Encounter (Signed)
Fax From: [facility] for Provera  Last Refilled: AEX 07/26/12 #90/3 refills was sent to pharmacy.  "Patient says she takes 1 qd, need this to be called in or faxed in so insurance will"  According to chart under medication tab patient is supposed  2.5 mg which is half a tab, patient says she takes 1   Please advise  (chart in your door)

## 2012-12-17 ENCOUNTER — Other Ambulatory Visit: Payer: Self-pay | Admitting: Orthopedic Surgery

## 2012-12-17 MED ORDER — MEDROXYPROGESTERONE ACETATE 5 MG PO TABS: 2.5000 mg | ORAL_TABLET | Freq: Every day | ORAL | Status: DC

## 2012-12-17 NOTE — Telephone Encounter (Signed)
Spoke with pt to clarify provera dosage. Pt says she has been taking a whole tablet of the provera, as that is what she thought she was supposed to do. Advised that she can take half a tablet from now on. Pt is out of the medication, and requests refill be sent to [facility] in [place].

## 2012-12-24 NOTE — Telephone Encounter (Signed)
Rx sent 12/17/12 #90/2 refills by Ms. Patty

## 2013-01-14 ENCOUNTER — Other Ambulatory Visit: Payer: Self-pay | Admitting: Endocrinology

## 2013-01-14 DIAGNOSIS — E049 Nontoxic goiter, unspecified: Secondary | ICD-10-CM

## 2013-01-22 ENCOUNTER — Ambulatory Visit: Payer: Medicare Other | Admitting: Cardiovascular Disease

## 2013-01-25 ENCOUNTER — Ambulatory Visit: Payer: Medicare Other | Admitting: Cardiovascular Disease

## 2013-01-25 ENCOUNTER — Ambulatory Visit (INDEPENDENT_AMBULATORY_CARE_PROVIDER_SITE_OTHER): Payer: Medicare Other | Admitting: Cardiovascular Disease

## 2013-01-25 ENCOUNTER — Encounter: Payer: Self-pay | Admitting: Cardiovascular Disease

## 2013-01-25 VITALS — BP 134/83 | HR 86 | Ht 66.0 in | Wt 135.0 lb

## 2013-01-25 DIAGNOSIS — I10 Essential (primary) hypertension: Secondary | ICD-10-CM

## 2013-01-25 DIAGNOSIS — R002 Palpitations: Secondary | ICD-10-CM

## 2013-01-25 DIAGNOSIS — E785 Hyperlipidemia, unspecified: Secondary | ICD-10-CM

## 2013-01-25 NOTE — Progress Notes (Signed)
Patient ID: [name], female   DOB: 11-Mar-1932, 80 y.o.   MRN: [id number]      SUBJECTIVE: The patient is a 80 year old woman with a history of hypertension, hypothyroidism, and hyperlipidemia who was most recently evaluated by cardiology in November 2012. She underwent a nuclear stress test at that time which was deemed low risk with no evidence of ischemia or scar, and normal left ventricular systolic function and wall motion. She presents today for evaluation of palpitations. She tells me she experienced them on one occasion this past Sunday while making Christmas candy. She said when she walked across the room she became very short of breath. She denies chest pain. She feels that her palpitations are more frequent when her thyroid hormone levels are abnormal. She says she did not experience the palpitations for several months prior to this most recent episode. Her most recent ECG from 11/15/2012 showed normal sinus rhythm with no premature contractions noted and no diagnostic ST segment or T wave abnormalities.    Allergies  Allergen Reactions  . Tape Rash    Blisters, nausea, and vomitting  . Latex Other (See Comments)    blister  . Lidocaine Nausea And Vomiting  . Sulfur Nausea And Vomiting    Current Outpatient Prescriptions  Medication Sig Dispense Refill  . acetaminophen (TYLENOL) 500 MG tablet Take 500 mg by mouth every 6 (six) hours as needed.       Marland Kitchen amLODipine (NORVASC) 5 MG tablet Take 5 mg by mouth every morning.       Marland Kitchen aspirin 325 MG tablet Take 325 mg by mouth at bedtime.      . Calcium-Vitamin D (CALTRATE 600 PLUS-VIT D PO) Take 1 tablet by mouth 2 (two) times daily.       Marland Kitchen docusate sodium (COLACE) 100 MG capsule Take 100 mg by mouth 3 (three) times daily as needed for constipation.      Marland Kitchen levothyroxine (SYNTHROID) 137 MCG tablet Take 150 mcg by mouth See admin instructions. Friday thru Sunday      . medroxyPROGESTERone (PROVERA) 5 MG tablet Take 0.5 tablets  (2.5 mg total) by mouth daily. 2.5 mg tab daily  90 tablet  2  . Multiple Vitamins-Minerals (MULTI COMPLETE PO) Take by mouth daily.       . niacin (NIASPAN) 1000 MG CR tablet Take 1,000 mg by mouth at bedtime.       . nortriptyline (PAMELOR) 25 MG capsule Take 75 mg by mouth every morning.       Marland Kitchen omeprazole (PRILOSEC) 40 MG capsule Take 20 mg by mouth 2 (two) times daily.       . simvastatin (ZOCOR) 20 MG tablet Take 20 mg by mouth at bedtime.       . Teriparatide, Recombinant, (FORTEO [place]) Inject 20 mcg into the skin daily.       . vitamin B-12 (CYANOCOBALAMIN) 1000 MCG tablet Take 1,000 mcg by mouth daily.      . vitamin C (ASCORBIC ACID) 500 MG tablet Take 500 mg by mouth daily.       . Vitamin D, Ergocalciferol, (DRISDOL) 50000 UNITS CAPS capsule Take 1 capsule (50,000 Units total) by mouth every 14 (fourteen) days.  26 capsule  1  . vitamin E 400 UNIT capsule Take 400 Units by mouth 2 (two) times daily.       Marland Kitchen zolpidem (AMBIEN) 10 MG tablet Take 10 mg by mouth at bedtime.      Marland Kitchen estradiol (ESTRACE)  0.5 MG tablet Take 0.5 tablets (0.25 mg total) by mouth daily.  90 tablet  3   No current facility-administered medications for this visit.    Past Medical History  Diagnosis Date  . Hypertension   . Hypothyroid   . Goiter   . GERD (gastroesophageal reflux disease)   . Fibromyalgia   . Heart murmur   . Peripheral vascular disease     varicose veins in left arm   . Shortness of breath     with exertion   . Anemia     hx of in 2003   . Hiatal hernia   . Headache(784.0)     tension headache daily per pt   . Arthritis   . Depression   . DJD (degenerative joint disease)   . Osteoarthritis   . Colon cancer     s/p resecton of colon in 2003 , hx of chemo  . Headaches, cluster   . Abnormal uterine bleeding (AUB)     on cont HRT  . Colon cancer   . TMJ (dislocation of temporomandibular joint)   . Compression fracture jan 2012    due to fall  . PONV (postoperative nausea and  vomiting)   . Choledocholithiasis with obstruction 11/16/2012  . Pancreatitis, acute 11/15/2012  . Calcification of aorta 11/16/2012  . Macular degeneration   . DM II (diabetes mellitus, type II), controlled 11/16/2012  . Blood transfusion without reported diagnosis     Past Surgical History  Procedure Laterality Date  . Tmj arthroplasty  1990's  . Appendectomy    . Colon resection  2003  . Eye surgery      bilateral cataract surgery   . Total knee arthroplasty  12/20/2010    Procedure: TOTAL KNEE ARTHROPLASTY;  Surgeon: Shelda PalMatthew D Olin;  Location: [facility];  Service: Orthopedics;  Laterality: Right;  . Colonoscopy w/ biopsies  09/25/2002, 09/18/2001  . Upper gastrointestinal endoscopy  09/18/2001  . Porta catheter placement  10/30/2001  . Porta catheter removal  06/27/2003  . Tmj arthroplasty    . Dilation and curettage of uterus    . Hysteroscopy    . Esophagogastroduodenoscopy endoscopy    . Vertebroplasty  2012  . Back surgery  2000    lower  . Tonsillectomy  age 89 or 8  . Joint replacement Right dec 2012    knee  . Ercp N/A 11/14/2012    Procedure: ENDOSCOPIC RETROGRADE CHOLANGIOPANCREATOGRAPHY (ERCP);  Surgeon: Petra KubaMarc E Magod, MD;  [facility];  Service: Endoscopy;  Laterality: N/A;  . Cholecystectomy N/A 11/17/2012    Procedure: LAPAROSCOPIC CHOLECYSTECTOMY;  Surgeon: Romie LeveeAlicia Thomas, MD;  Location: [facility];  Service: General;  Laterality: N/A;    History   Social History  . Marital Status: Single    Spouse Name: N/A    Number of Children: 1  . Years of Education: N/A   Occupational History  .      retired   Social History Main Topics  . Smoking status: Never Smoker   . Smokeless tobacco: Never Used  . Alcohol Use: No  . Drug Use: No  . Sexual Activity: No   Other Topics Concern  . Not on file   Social History Narrative  . No narrative on file     Filed Vitals:   01/25/13 1425  BP: 134/83  Pulse: 86  Height: 5\' 6"  (1.676 m)  Weight: 135 lb  (61.236 kg)    PHYSICAL EXAM General: NAD Neck: No JVD,  no thyromegaly or thyroid nodule.  Lungs: Clear to auscultation bilaterally with normal respiratory effort. CV: Nondisplaced PMI.  Heart regular S1/S2, no S3/S4, soft I/VI holosystolic murmur along left sternal border.  No peripheral edema.  No carotid bruit.  Normal pedal pulses.  Abdomen: Soft, nontender, no hepatosplenomegaly, no distention.  Neurologic: Alert and oriented x 3.  Psych: Normal affect. Extremities: No clubbing or cyanosis.   ECG: reviewed and available in electronic records.      ASSESSMENT AND PLAN: 1. Palpitations: In order to more accurately define what her palpitations represent (e.g. PAC's, PVC's, tachyarrhythmia), I will proceed with a one-month event monitor. For the time being, I will not prescribe any medications. 2. HTN: controlled on present therapy. 3. Hyperlipidemia: on simvastatin 20 mg daily.  Dispo: f/u 2 months.   Kate Sable, M.D., F.A.C.C.

## 2013-01-25 NOTE — Patient Instructions (Addendum)
Your physician recommends that you schedule a follow-up appointment in: 5 weeks Your physician recommends that you continue on your current medications as directed. Please refer to the Current Medication list given to you today.  Your physician has recommended that you wear an event monitor for 4 weeks . Event monitors are medical devices that record the heart's electrical activity. Doctors most often [place] these monitors to diagnose arrhythmias. Arrhythmias are problems with the speed or rhythm of the heartbeat. The monitor is a small, portable device. You can wear one while you do your normal daily activities. This is usually used to diagnose what is causing palpitations/syncope (passing out).

## 2013-01-30 ENCOUNTER — Telehealth: Payer: Self-pay | Admitting: *Deleted

## 2013-01-30 DIAGNOSIS — R002 Palpitations: Secondary | ICD-10-CM

## 2013-01-30 NOTE — Telephone Encounter (Signed)
Called Mitch for [employer]. Mitch called [employer] himself and called our office back stating that that [employer] has shipped it out and should be at pt's house either today or tomorrow.  Called pt's son and made him aware.

## 2013-01-30 NOTE — Telephone Encounter (Signed)
PT son is calling being very rude about not receiving monitor yet. States that she is to wear it for four weeks and f/u in five weeks. Someone called them from [employer] stating that they are waiting on the insurance company to be able to send out monitor. He wants to know what are problem is and why we have not handled it yet. States that "they" as in the patient has a contract with [place] and we have a contract with [employer] so why have we not taken care of it.  I advised patient that I did not see any correspondence from insurance company in the system and I would send a message tot the nurse to look into.

## 2013-03-04 ENCOUNTER — Ambulatory Visit: Payer: Medicare Other | Admitting: Cardiovascular Disease

## 2013-03-06 ENCOUNTER — Other Ambulatory Visit: Payer: Self-pay | Admitting: *Deleted

## 2013-03-06 DIAGNOSIS — R002 Palpitations: Secondary | ICD-10-CM

## 2013-03-06 NOTE — Addendum Note (Signed)
Addended by: Barbarann Ehlers A on: 03/06/2013 01:18 PM   Modules accepted: Orders

## 2013-03-08 ENCOUNTER — Ambulatory Visit (INDEPENDENT_AMBULATORY_CARE_PROVIDER_SITE_OTHER): Payer: Medicare Other | Admitting: Cardiovascular Disease

## 2013-03-08 ENCOUNTER — Encounter: Payer: Self-pay | Admitting: Cardiovascular Disease

## 2013-03-08 VITALS — BP 147/68 | HR 87 | Ht 66.0 in | Wt 136.0 lb

## 2013-03-08 DIAGNOSIS — I10 Essential (primary) hypertension: Secondary | ICD-10-CM

## 2013-03-08 DIAGNOSIS — R002 Palpitations: Secondary | ICD-10-CM

## 2013-03-08 DIAGNOSIS — E785 Hyperlipidemia, unspecified: Secondary | ICD-10-CM

## 2013-03-08 NOTE — Progress Notes (Signed)
Patient ID: [name], female   DOB: [birth year], 80 y.o.   MRN: [id number]      SUBJECTIVE: The patient is here to followup for palpitations. Her one-month event monitor revealed normal sinus rhythm with no documented arrhythmias. She has not experienced any more palpitations since her Synthroid was increased to 150 mcg daily.    Allergies  Allergen Reactions  . Tape Rash    Blisters, nausea, and vomitting  . Latex Other (See Comments)    blister  . Lidocaine Nausea And Vomiting  . Sulfur Nausea And Vomiting    Current Outpatient Prescriptions  Medication Sig Dispense Refill  . acetaminophen (TYLENOL) 500 MG tablet Take 500 mg by mouth every 6 (six) hours as needed.       Marland Kitchen amLODipine (NORVASC) 5 MG tablet Take 5 mg by mouth every morning.       Marland Kitchen aspirin 325 MG tablet Take 325 mg by mouth at bedtime.      . Calcium-Vitamin D (CALTRATE 600 PLUS-VIT D PO) Take 1 tablet by mouth 2 (two) times daily.       Marland Kitchen docusate sodium (COLACE) 100 MG capsule Take 100 mg by mouth 3 (three) times daily as needed for constipation.      Marland Kitchen estradiol (ESTRACE) 0.5 MG tablet Take 0.5 tablets (0.25 mg total) by mouth daily.  90 tablet  3  . levothyroxine (SYNTHROID) 137 MCG tablet Take 150 mcg by mouth See admin instructions. Friday thru Sunday      . medroxyPROGESTERone (PROVERA) 5 MG tablet Take 0.5 tablets (2.5 mg total) by mouth daily. 2.5 mg tab daily  90 tablet  2  . Multiple Vitamins-Minerals (MULTI COMPLETE PO) Take by mouth daily.       . niacin (NIASPAN) 1000 MG CR tablet Take 1,000 mg by mouth at bedtime.       . nortriptyline (PAMELOR) 25 MG capsule Take 75 mg by mouth every morning.       Marland Kitchen omeprazole (PRILOSEC) 40 MG capsule Take 20 mg by mouth 2 (two) times daily.       . simvastatin (ZOCOR) 20 MG tablet Take 20 mg by mouth at bedtime.       . Teriparatide, Recombinant, (FORTEO [place]) Inject 20 mcg into the skin daily.       . vitamin B-12 (CYANOCOBALAMIN) 1000 MCG tablet Take 1,000  mcg by mouth daily.      . vitamin C (ASCORBIC ACID) 500 MG tablet Take 500 mg by mouth daily.       . Vitamin D, Ergocalciferol, (DRISDOL) 50000 UNITS CAPS capsule Take 1 capsule (50,000 Units total) by mouth every 14 (fourteen) days.  26 capsule  1  . vitamin E 400 UNIT capsule Take 400 Units by mouth 2 (two) times daily.       Marland Kitchen zolpidem (AMBIEN) 10 MG tablet Take 10 mg by mouth at bedtime.       No current facility-administered medications for this visit.    Past Medical History  Diagnosis Date  . Hypertension   . Hypothyroid   . Goiter   . GERD (gastroesophageal reflux disease)   . Fibromyalgia   . Heart murmur   . Peripheral vascular disease     varicose veins in left arm   . Shortness of breath     with exertion   . Anemia     hx of in 2003   . Hiatal hernia   . Headache(784.0)     tension  headache daily per pt   . Arthritis   . Depression   . DJD (degenerative joint disease)   . Osteoarthritis   . Colon cancer     s/p resecton of colon in 2003 , hx of chemo  . Headaches, cluster   . Abnormal uterine bleeding (AUB)     on cont HRT  . Colon cancer   . TMJ (dislocation of temporomandibular joint)   . Compression fracture jan 2012    due to fall  . PONV (postoperative nausea and vomiting)   . Choledocholithiasis with obstruction 11/16/2012  . Pancreatitis, acute 11/15/2012  . Calcification of aorta 11/16/2012  . Macular degeneration   . DM II (diabetes mellitus, type II), controlled 11/16/2012  . Blood transfusion without reported diagnosis     Past Surgical History  Procedure Laterality Date  . Tmj arthroplasty  1990's  . Appendectomy    . Colon resection  2003  . Eye surgery      bilateral cataract surgery   . Total knee arthroplasty  12/20/2010    Procedure: TOTAL KNEE ARTHROPLASTY;  Surgeon: Mauri Pole;  Location: [facility];  Service: Orthopedics;  Laterality: Right;  . Colonoscopy w/ biopsies  09/25/2002, 09/18/2001  . Upper gastrointestinal endoscopy   09/18/2001  . Porta catheter placement  10/30/2001  . Porta catheter removal  06/27/2003  . Tmj arthroplasty    . Dilation and curettage of uterus    . Hysteroscopy    . Esophagogastroduodenoscopy endoscopy    . Vertebroplasty  2012  . Back surgery  2000    lower  . Tonsillectomy  age 9 or 53  . Joint replacement Right dec 2012    knee  . Ercp N/A 11/14/2012    Procedure: ENDOSCOPIC RETROGRADE CHOLANGIOPANCREATOGRAPHY (ERCP);  Surgeon: Jeryl Columbia, MD;  Location: [facility];  Service: Endoscopy;  Laterality: N/A;  . Cholecystectomy N/A 11/17/2012    Procedure: LAPAROSCOPIC CHOLECYSTECTOMY;  Surgeon: Leighton Ruff, MD;  Location: [facility];  Service: General;  Laterality: N/A;    History   Social History  . Marital Status: Single    Spouse Name: N/A    Number of Children: 1  . Years of Education: N/A   Occupational History  .      retired   Social History Main Topics  . Smoking status: Never Smoker   . Smokeless tobacco: Never Used  . Alcohol Use: No  . Drug Use: No  . Sexual Activity: No   Other Topics Concern  . Not on file   Social History Narrative  . No narrative on file     Filed Vitals:   03/08/13 1124  BP: 147/68  Pulse: 87  Height: 5\' 6"  (1.676 m)  Weight: 136 lb (61.689 kg)    PHYSICAL EXAM General: NAD  Neck: No JVD, no thyromegaly or thyroid nodule.  Lungs: Clear to auscultation bilaterally with normal respiratory effort.  CV: Nondisplaced PMI. Heart regular S1/S2, no S3/S4, soft I/VI holosystolic murmur along left sternal border. No peripheral edema. No carotid bruit. Normal pedal pulses.  Abdomen: Soft, nontender, no hepatosplenomegaly, no distention.  Neurologic: Alert and oriented x 3.  Psych: Normal affect.  Extremities: No clubbing or cyanosis.    ECG: reviewed and available in electronic records.      ASSESSMENT AND PLAN: 1. Palpitations:  Her one-month event monitor revealed normal sinus rhythm with no documented arrhythmias. Her  symptoms appear to have resolved with the increase in dose of her Synthroid. 2.  HTN: mildly elevated today, but previously well controlled on present therapy. Would recommend continued monitoring. 3. Hyperlipidemia: on simvastatin 20 mg daily.   Dispo: f/u prn.    Kate Sable, M.D., F.A.C.C.

## 2013-03-08 NOTE — Patient Instructions (Signed)
Your physician recommends that you schedule a follow-up appointment only as needed!    Your physician recommends that you continue on your current medications as directed. Please refer to the Current Medication list given to you today.    Thanks for choosing [facility] !

## 2013-07-01 ENCOUNTER — Ambulatory Visit
Admission: RE | Admit: 2013-07-01 | Discharge: 2013-07-01 | Disposition: A | Discharge status: Home / Self Care | Payer: Medicare Other | Source: Ambulatory Visit | Attending: Endocrinology | Admitting: Endocrinology

## 2013-07-01 ENCOUNTER — Other Ambulatory Visit: Payer: Self-pay | Admitting: Hematology and Oncology

## 2013-07-01 DIAGNOSIS — E049 Nontoxic goiter, unspecified: Secondary | ICD-10-CM

## 2013-07-01 DIAGNOSIS — C189 Malignant neoplasm of colon, unspecified: Secondary | ICD-10-CM

## 2013-07-01 DIAGNOSIS — D539 Nutritional anemia, unspecified: Secondary | ICD-10-CM

## 2013-07-01 DIAGNOSIS — E039 Hypothyroidism, unspecified: Secondary | ICD-10-CM

## 2013-07-02 ENCOUNTER — Telehealth: Payer: Self-pay | Admitting: Hematology and Oncology

## 2013-07-02 ENCOUNTER — Ambulatory Visit (HOSPITAL_BASED_OUTPATIENT_CLINIC_OR_DEPARTMENT_OTHER): Payer: Medicare Other | Admitting: Hematology and Oncology

## 2013-07-02 ENCOUNTER — Ambulatory Visit (HOSPITAL_COMMUNITY)
Admission: RE | Admit: 2013-07-02 | Discharge: 2013-07-02 | Disposition: A | Discharge status: Home / Self Care | Payer: Medicare Other | Source: Ambulatory Visit | Attending: Hematology and Oncology | Admitting: Hematology and Oncology

## 2013-07-02 ENCOUNTER — Encounter: Payer: Self-pay | Admitting: Hematology and Oncology

## 2013-07-02 ENCOUNTER — Other Ambulatory Visit (HOSPITAL_BASED_OUTPATIENT_CLINIC_OR_DEPARTMENT_OTHER): Payer: Medicare Other

## 2013-07-02 VITALS — BP 144/81 | HR 87 | Temp 97.7°F | Resp 18 | Ht 66.0 in | Wt 134.8 lb

## 2013-07-02 DIAGNOSIS — E039 Hypothyroidism, unspecified: Secondary | ICD-10-CM

## 2013-07-02 DIAGNOSIS — Z87891 Personal history of nicotine dependence: Secondary | ICD-10-CM | POA: Insufficient documentation

## 2013-07-02 DIAGNOSIS — Z85038 Personal history of other malignant neoplasm of large intestine: Secondary | ICD-10-CM

## 2013-07-02 DIAGNOSIS — R0989 Other specified symptoms and signs involving the circulatory and respiratory systems: Secondary | ICD-10-CM | POA: Insufficient documentation

## 2013-07-02 DIAGNOSIS — R05 Cough: Secondary | ICD-10-CM

## 2013-07-02 DIAGNOSIS — R059 Cough, unspecified: Secondary | ICD-10-CM | POA: Insufficient documentation

## 2013-07-02 DIAGNOSIS — M7989 Other specified soft tissue disorders: Secondary | ICD-10-CM | POA: Insufficient documentation

## 2013-07-02 DIAGNOSIS — R0609 Other forms of dyspnea: Secondary | ICD-10-CM

## 2013-07-02 DIAGNOSIS — D7589 Other specified diseases of blood and blood-forming organs: Secondary | ICD-10-CM | POA: Insufficient documentation

## 2013-07-02 DIAGNOSIS — C189 Malignant neoplasm of colon, unspecified: Secondary | ICD-10-CM

## 2013-07-02 DIAGNOSIS — D539 Nutritional anemia, unspecified: Secondary | ICD-10-CM

## 2013-07-02 LAB — COMPREHENSIVE METABOLIC PANEL (CC13)
ALBUMIN: 3.8 g/dL (ref 3.5–5.0)
ALT: 22 U/L (ref 0–55)
AST: 23 U/L (ref 5–34)
Alkaline Phosphatase: 82 U/L (ref 40–150)
Anion Gap: 8 mEq/L (ref 3–11)
BUN: 18.1 mg/dL (ref 7.0–26.0)
CALCIUM: 10.3 mg/dL (ref 8.4–10.4)
CHLORIDE: 103 meq/L (ref 98–109)
CO2: 29 meq/L (ref 22–29)
Creatinine: 0.9 mg/dL (ref 0.6–1.1)
GLUCOSE: 108 mg/dL (ref 70–140)
POTASSIUM: 4.2 meq/L (ref 3.5–5.1)
SODIUM: 140 meq/L (ref 136–145)
TOTAL PROTEIN: 7.4 g/dL (ref 6.4–8.3)
Total Bilirubin: 0.36 mg/dL (ref 0.20–1.20)

## 2013-07-02 LAB — CBC & DIFF AND RETIC
BASO%: 0.2 % (ref 0.0–2.0)
BASOS ABS: 0 10*3/uL (ref 0.0–0.1)
EOS ABS: 0.4 10*3/uL (ref 0.0–0.5)
EOS%: 3.8 % (ref 0.0–7.0)
HCT: 37.9 % (ref 34.8–46.6)
HEMOGLOBIN: 12.5 g/dL (ref 11.6–15.9)
Immature Retic Fract: 4 % (ref 1.60–10.00)
LYMPH%: 17.5 % (ref 14.0–49.7)
MCH: 34.8 pg — ABNORMAL HIGH (ref 25.1–34.0)
MCHC: 33 g/dL (ref 31.5–36.0)
MCV: 105.6 fL — AB (ref 79.5–101.0)
MONO#: 1.1 10*3/uL — ABNORMAL HIGH (ref 0.1–0.9)
MONO%: 11.7 % (ref 0.0–14.0)
NEUT#: 6.2 10*3/uL (ref 1.5–6.5)
NEUT%: 66.8 % (ref 38.4–76.8)
PLATELETS: 215 10*3/uL (ref 145–400)
RBC: 3.59 10*6/uL — ABNORMAL LOW (ref 3.70–5.45)
RDW: 13 % (ref 11.2–14.5)
Retic %: 1.37 % (ref 0.70–2.10)
Retic Ct Abs: 49.18 10*3/uL (ref 33.70–90.70)
WBC: 9.2 10*3/uL (ref 3.9–10.3)
lymph#: 1.6 10*3/uL (ref 0.9–3.3)
nRBC: 0 % (ref 0–0)

## 2013-07-02 LAB — IRON AND TIBC CHCC
%SAT: 26 % (ref 21–57)
Iron: 75 ug/dL (ref 41–142)
TIBC: 284 ug/dL (ref 236–444)
UIBC: 209 ug/dL (ref 120–384)

## 2013-07-02 LAB — VITAMIN B12: VITAMIN B 12: 827 pg/mL (ref 211–911)

## 2013-07-02 LAB — BRAIN NATRIURETIC PEPTIDE: Brain Natriuretic Peptide: 13.2 pg/mL (ref 0.0–100.0)

## 2013-07-02 LAB — FERRITIN CHCC: Ferritin: 181 ng/ml (ref 9–269)

## 2013-07-02 LAB — MORPHOLOGY: PLT EST: ADEQUATE

## 2013-07-02 LAB — CEA: CEA: 3.3 ng/mL (ref 0.0–5.0)

## 2013-07-02 LAB — TSH CHCC: TSH: 0.509 m(IU)/L (ref 0.308–3.960)

## 2013-07-02 NOTE — Progress Notes (Signed)
[facility] progress notes  Patient Care Team: Lanette Hampshire, MD as PCP - General (Family Medicine) Jeryl Columbia, MD as Consulting Physician (Gastroenterology) Herminio Commons, MD as Attending Physician (Cardiology)  CHIEF COMPLAINTS/PURPOSE OF VISIT:  History of stage III colon cancer  HISTORY OF PRESENTING ILLNESS:  [name] 80 y.o. female was transferred to my care after her prior physician has left.  I reviewed the patient's records extensive and collaborated the history with the patient. Summary of her history is as follows: She was diagnosed with stage IIIB pT3 N1 M0 adenocarcinoma of the right colon after presentation was of the anemia. She had resection in September 2003.  She received adjuvant chemotherapy with 5-FU, irinotecan, leucovorin which finished in April 2004 by Dr. Burney Gauze.  Last surveillance colonoscopy with Dr. Watt Climes was reportedly negative in September 2012.   The patient denies any recent signs or symptoms of bleeding such as spontaneous epistaxis, hematuria or hematochezia. Denies any bowel habit changes. Recently, should have a thyroid medication adjusted. She also had recent cough.  MEDICAL HISTORY:  Past Medical History  Diagnosis Date  . Hypertension   . Hypothyroid   . Goiter   . GERD (gastroesophageal reflux disease)   . Fibromyalgia   . Heart murmur   . Peripheral vascular disease     varicose veins in left arm   . Shortness of breath     with exertion   . Anemia     hx of in 2003   . Hiatal hernia   . Headache(784.0)     tension headache daily per pt   . Arthritis   . Depression   . DJD (degenerative joint disease)   . Osteoarthritis   . Colon cancer     s/p resecton of colon in 2003 , hx of chemo  . Headaches, cluster   . Abnormal uterine bleeding (AUB)     on cont HRT  . Colon cancer   . TMJ (dislocation of temporomandibular joint)   . Compression fracture jan 2012    due to fall  . PONV  (postoperative nausea and vomiting)   . Choledocholithiasis with obstruction 11/16/2012  . Pancreatitis, acute 11/15/2012  . Calcification of aorta 11/16/2012  . Macular degeneration   . DM II (diabetes mellitus, type II), controlled 11/16/2012  . Blood transfusion without reported diagnosis     SURGICAL HISTORY: Past Surgical History  Procedure Laterality Date  . Tmj arthroplasty  1990's  . Appendectomy    . Colon resection  2003  . Eye surgery      bilateral cataract surgery   . Total knee arthroplasty  12/20/2010    Procedure: TOTAL KNEE ARTHROPLASTY;  Surgeon: Mauri Pole;  Location: [facility];  Service: Orthopedics;  Laterality: Right;  . Colonoscopy w/ biopsies  09/25/2002, 09/18/2001  . Upper gastrointestinal endoscopy  09/18/2001  . Porta catheter placement  10/30/2001  . Porta catheter removal  06/27/2003  . Tmj arthroplasty    . Dilation and curettage of uterus    . Hysteroscopy    . Esophagogastroduodenoscopy endoscopy    . Vertebroplasty  2012  . Back surgery  2000    lower  . Tonsillectomy  age 5 or 34  . Joint replacement Right dec 2012    knee  . Ercp N/A 11/14/2012    Procedure: ENDOSCOPIC RETROGRADE CHOLANGIOPANCREATOGRAPHY (ERCP);  Surgeon: Jeryl Columbia, MD;  Location: [facility];  Service: Endoscopy;  Laterality: N/A;  .  Cholecystectomy N/A 11/17/2012    Procedure: LAPAROSCOPIC CHOLECYSTECTOMY;  Surgeon: Leighton Ruff, MD;  Location: [facility];  Service: General;  Laterality: N/A;    SOCIAL HISTORY: History   Social History  . Marital Status: Single    Spouse Name: N/A    Number of Children: 1  . Years of Education: N/A   Occupational History  .      retired   Social History Main Topics  . Smoking status: Never Smoker   . Smokeless tobacco: Never Used  . Alcohol Use: No  . Drug Use: No  . Sexual Activity: No   Other Topics Concern  . Not on file   Social History Narrative  . No narrative on file    FAMILY HISTORY: Family History  Problem  Relation Age of Onset  . Heart disease Mother     Enlarged heart  . Arrhythmia Mother   . Diabetes Mother   . Stroke Sister   . Heart disease Brother     MI at unknown age  . Anesthesia problems Brother   . Heart attack      MI at age 55  . Heart disease Brother     CABG  . Heart disease Father   . Cancer Son     non-hodgkins lymphoma    ALLERGIES:  is allergic to tape; latex; lidocaine; and sulfur.  MEDICATIONS:  Current Outpatient Prescriptions  Medication Sig Dispense Refill  . acetaminophen (TYLENOL) 500 MG tablet Take 1,000 mg by mouth 2 (two) times daily.       Marland Kitchen amLODipine (NORVASC) 5 MG tablet Take 5 mg by mouth every morning.       Marland Kitchen aspirin 325 MG tablet Take 325 mg by mouth at bedtime.      . Calcium-Vitamin D (CALTRATE 600 PLUS-VIT D PO) Take 1 tablet by mouth 2 (two) times daily.       Marland Kitchen docusate sodium (COLACE) 100 MG capsule Take 100 mg by mouth 3 (three) times daily as needed for constipation.      Marland Kitchen estradiol (ESTRACE) 0.5 MG tablet Take 0.5 tablets (0.25 mg total) by mouth daily.  90 tablet  3  . levothyroxine (SYNTHROID, LEVOTHROID) 200 MCG tablet Take 200 mcg by mouth daily before breakfast.      . medroxyPROGESTERone (PROVERA) 5 MG tablet Take 0.5 tablets (2.5 mg total) by mouth daily. 2.5 mg tab daily  90 tablet  2  . Multiple Vitamins-Minerals (MULTI COMPLETE PO) Take by mouth daily.       . niacin (NIASPAN) 1000 MG CR tablet Take 1,000 mg by mouth at bedtime.       . nortriptyline (PAMELOR) 25 MG capsule Take 75 mg by mouth every morning.       Marland Kitchen omeprazole (PRILOSEC) 40 MG capsule Take 40 mg by mouth 2 (two) times daily.       . simvastatin (ZOCOR) 20 MG tablet Take 20 mg by mouth at bedtime.       . Teriparatide, Recombinant, (FORTEO [place]) Inject 20 mcg into the skin daily.       . vitamin B-12 (CYANOCOBALAMIN) 1000 MCG tablet Take 1,000 mcg by mouth daily.      . vitamin C (ASCORBIC ACID) 500 MG tablet Take 500 mg by mouth daily.       . Vitamin D,  Ergocalciferol, (DRISDOL) 50000 UNITS CAPS capsule Take 1 capsule (50,000 Units total) by mouth every 14 (fourteen) days.  26 capsule  1  . vitamin  E 400 UNIT capsule Take 400 Units by mouth 2 (two) times daily.       Marland Kitchen zolpidem (AMBIEN) 10 MG tablet Take 10 mg by mouth at bedtime.       No current facility-administered medications for this visit.    REVIEW OF SYSTEMS:   Constitutional: Denies fevers, chills or abnormal night sweats Eyes: Denies blurriness of vision, double vision or watery eyes Ears, nose, mouth, throat, and face: Denies mucositis or sore throat Cardiovascular: Denies palpitation, chest discomfort or lower extremity swelling Gastrointestinal:  Denies nausea, heartburn or change in bowel habits Skin: Denies abnormal skin rashes Lymphatics: Denies new lymphadenopathy or easy bruising Neurological:Denies numbness, tingling or new weaknesses Behavioral/Psych: Mood is stable, no new changes  All other systems were reviewed with the patient and are negative.  PHYSICAL EXAMINATION: ECOG PERFORMANCE STATUS: 0 - Asymptomatic  Filed Vitals:   07/02/13 0930  BP: 144/81  Pulse: 87  Temp: 97.7 F (36.5 C)  Resp: 18   Filed Weights   07/02/13 0930  Weight: 134 lb 12.8 oz (61.145 kg)    GENERAL:alert, no distress and comfortable SKIN: skin color, texture, turgor are normal, no rashes or significant lesions EYES: normal, conjunctiva are pink and non-injected, sclera clear OROPHARYNX:no exudate, normal lips, buccal mucosa, and tongue  NECK: supple, no thyroid abnormalities LYMPH:  no palpable lymphadenopathy in the cervical, axillary or inguinal LUNGS: Mild crackles on auscultation at bilateral bases with normal breathing effort HEART: regular rate & rhythm and no murmurs without lower extremity edema ABDOMEN:abdomen soft, non-tender and normal bowel sounds Musculoskeletal:no cyanosis of digits and no clubbing  PSYCH: alert & oriented x 3 with fluent speech NEURO: no  focal motor/sensory deficits  LABORATORY DATA:  I have reviewed the data as listed Lab Results  Component Value Date   WBC 9.2 07/02/2013   HGB 12.5 07/02/2013   HCT 37.9 07/02/2013   MCV 105.6* 07/02/2013   PLT 215 07/02/2013    Recent Labs  11/17/12 0355 11/18/12 0825 11/19/12 0415 07/02/13 0915  NA 128* 128* 129* 140  K 3.7 3.4* 3.9 4.2  CL 95* 96 98  --   CO2 23 23 24 29   GLUCOSE 153* 236* 138* 108  BUN 7 11 7  18.1  CREATININE 0.61 0.66 0.62 0.9  CALCIUM 9.8 8.7 9.1 10.3  GFRNONAA 85* 83* 85*  --   GFRAA >90 >90 >90  --   PROT 7.7 6.3  --  7.4  ALBUMIN 3.5 2.6*  --  3.8  AST 64* 54*  --  23  ALT 158* 109*  --  22  ALKPHOS 263* 203*  --  82  BILITOT 1.0 0.6  --  0.36    RADIOGRAPHIC STUDIES: I have personally reviewed the radiological images as listed and agreed with the findings in the report. Dg Chest 2 View  07/02/2013   CLINICAL DATA:  Cough, cracking at lung bases, history smoking, colon cancer, hypertension, fibromyalgia, diabetes  EXAM: CHEST  2 VIEW  COMPARISON:  11/15/2012  FINDINGS: Normal heart size, mediastinal contours, and pulmonary vascularity.  Minimal atherosclerotic calcification aortic arch.  Lungs clear.  No pleural effusion or pneumothorax.  Bones demineralized with compression fracture and prior vertebroplasty at approximately T12.  IMPRESSION: No acute abnormalities.   Electronically Signed   By: Lavonia Dana M.D.   On: 07/02/2013 12:41   ASSESSMENT & PLAN:  History of colon cancer, stage III His disease free for over 11 years. The patient wants to continue  followup here and now see her once a year with history, physical examination and blood work.  Cough She has some abnormal chest examination. She also has a mild cough. I have ordered chest x-ray and at the time of dictation this is reported as normal. The patient is informed of the test results.  Macrocytosis without anemia I suspect this is related to recent changes in her thyroid  medications. Serum vitamin B12, hepatic panel and TSH were within normal limits. The patient is informed for test results. There is no indication to proceed with bone marrow biopsy or further investigation at this point as she is not symptomatic.    Orders Placed This Encounter  Procedures  . DG Chest 2 View    Standing Status: Future     Number of Occurrences: 1     Standing Expiration Date: 07/02/2014    Order Specific Question:  Reason for exam:    Answer:  cough, crackles at lung bases    Order Specific Question:  Preferred imaging location?    Answer:  [facility]  . Brain natriuretic peptide    Standing Status: Future     Number of Occurrences: 1     Standing Expiration Date: 07/02/2014  . CBC with Differential    Standing Status: Future     Number of Occurrences:      Standing Expiration Date: 07/02/2014  . Comprehensive metabolic panel    Standing Status: Future     Number of Occurrences:      Standing Expiration Date: 07/02/2014  . CEA    Standing Status: Future     Number of Occurrences:      Standing Expiration Date: 07/02/2014    All questions were answered. The patient knows to call the clinic with any problems, questions or concerns. I spent 25 minutes counseling the patient face to face. The total time spent in the appointment was 30 minutes and more than 50% was on counseling.     [facility], [place], MD 07/02/2013 7:07 PM

## 2013-07-02 NOTE — Telephone Encounter (Signed)
, °

## 2013-07-02 NOTE — Assessment & Plan Note (Signed)
His disease free for over 11 years. The patient wants to continue followup here and now see her once a year with history, physical examination and blood work.

## 2013-07-02 NOTE — Assessment & Plan Note (Signed)
I suspect this is related to recent changes in her thyroid medications. Serum vitamin B12, hepatic panel and TSH were within normal limits. The patient is informed for test results. There is no indication to proceed with bone marrow biopsy or further investigation at this point as she is not symptomatic.

## 2013-07-02 NOTE — Assessment & Plan Note (Signed)
She has some abnormal chest examination. She also has a mild cough. I have ordered chest x-ray and at the time of dictation this is reported as normal. The patient is informed of the test results.

## 2013-07-29 ENCOUNTER — Encounter: Payer: Self-pay | Admitting: Nurse Practitioner

## 2013-07-29 ENCOUNTER — Ambulatory Visit (INDEPENDENT_AMBULATORY_CARE_PROVIDER_SITE_OTHER): Payer: Medicare Other | Admitting: Nurse Practitioner

## 2013-07-29 VITALS — BP 140/72 | HR 80 | Resp 20 | Ht 66.0 in | Wt 134.0 lb

## 2013-07-29 DIAGNOSIS — R300 Dysuria: Secondary | ICD-10-CM

## 2013-07-29 DIAGNOSIS — Z01419 Encounter for gynecological examination (general) (routine) without abnormal findings: Secondary | ICD-10-CM

## 2013-07-29 LAB — POCT URINALYSIS DIPSTICK
BILIRUBIN UA: NEGATIVE
Blood, UA: NEGATIVE
Glucose, UA: NEGATIVE
Ketones, UA: NEGATIVE
NITRITE UA: NEGATIVE
Protein, UA: NEGATIVE
Urobilinogen, UA: NEGATIVE
pH, UA: 6

## 2013-07-29 MED ORDER — MEDROXYPROGESTERONE ACETATE 5 MG PO TABS: 2.5000 mg | ORAL_TABLET | Freq: Every day | ORAL | Status: DC

## 2013-07-29 MED ORDER — ESTRADIOL 0.5 MG PO TABS: 0.2500 mg | ORAL_TABLET | Freq: Every day | ORAL | Status: DC

## 2013-07-29 NOTE — Progress Notes (Signed)
Patient ID: [name], female   DOB: 06/04/1932, 80 y.o.   MRN: [id number] 80 y.o. G1P1 Divorced Caucasian Fe here for annual exam.  recent UTI and treated with antibiotics in February 15.  Some slight dysuria now.  Really wants to continue HRT - feels better on med's.  Son has been a big help to her.  Patient's last menstrual period was 01/17/1994.          Sexually active: No.  The current method of family planning is post menopausal status.    Exercising: Yes.    Home exercise routine includes walking 30 minutes daily at [facility], and stretching exercises every morning.. Smoker:  no  Health Maintenance: Pap: 07/13/2009 negative  MMG: 12/03/12, Bi-Rads 2: benign findings Colonoscopy: 10/11/10, polyp, repeat in 5 years BMD: 09/2011 T Score: Spine -0.5; left femur neck -2.8; total -2.3 completed 2 years of Forteo in June 2015.  Will get a repeat BMD in September 2015 per pt.  TDaP: PCP maintains tetanus  Labs: PCP maintains labs. Including Vit D.    Urine:  1+ leuk's, others negative   reports that she has never smoked. She has never used smokeless tobacco. She reports that she does not drink alcohol or use illicit drugs.  Past Medical History  Diagnosis Date  . Hypertension   . Hypothyroid   . Goiter   . GERD (gastroesophageal reflux disease)   . Fibromyalgia   . Heart murmur   . Peripheral vascular disease     varicose veins in left arm   . Shortness of breath     with exertion   . Anemia     hx of in 2003   . Hiatal hernia   . Headache(784.0)     tension headache daily per pt   . Arthritis   . Depression   . DJD (degenerative joint disease)   . Osteoarthritis   . Colon cancer     s/p resecton of colon in 2003 , hx of chemo  . Headaches, cluster   . Abnormal uterine bleeding (AUB)     on cont HRT  . Colon cancer   . TMJ (dislocation of temporomandibular joint)   . Compression fracture jan 2012    due to fall  . PONV (postoperative nausea and vomiting)   .  Choledocholithiasis with obstruction 11/16/2012  . Pancreatitis, acute 11/15/2012  . Calcification of aorta 11/16/2012  . Macular degeneration   . DM II (diabetes mellitus, type II), controlled 11/16/2012  . Blood transfusion without reported diagnosis     Past Surgical History  Procedure Laterality Date  . Tmj arthroplasty  1990's  . Appendectomy    . Colon resection  2003  . Eye surgery      bilateral cataract surgery   . Total knee arthroplasty  12/20/2010    Procedure: TOTAL KNEE ARTHROPLASTY;  Surgeon: Mauri Pole;  Location: [facility];  Service: Orthopedics;  Laterality: Right;  . Colonoscopy w/ biopsies  09/25/2002, 09/18/2001  . Upper gastrointestinal endoscopy  09/18/2001  . Porta catheter placement  10/30/2001  . Porta catheter removal  06/27/2003  . Tmj arthroplasty    . Dilation and curettage of uterus    . Hysteroscopy    . Esophagogastroduodenoscopy endoscopy    . Vertebroplasty  2012  . Back surgery  2000    lower  . Tonsillectomy  age 58 or 22  . Joint replacement Right dec 2012    knee  . Ercp N/A 11/14/2012  Procedure: ENDOSCOPIC RETROGRADE CHOLANGIOPANCREATOGRAPHY (ERCP);  Surgeon: Jeryl Columbia, MD;  Location: [facility];  Service: Endoscopy;  Laterality: N/A;  . Cholecystectomy N/A 11/17/2012    Procedure: LAPAROSCOPIC CHOLECYSTECTOMY;  Surgeon: Leighton Ruff, MD;  Location: [facility];  Service: General;  Laterality: N/A;    Current Outpatient Prescriptions  Medication Sig Dispense Refill  . acetaminophen (TYLENOL) 500 MG tablet Take 1,000 mg by mouth 2 (two) times daily.       Marland Kitchen amLODipine (NORVASC) 5 MG tablet Take 5 mg by mouth every morning.       Marland Kitchen aspirin 325 MG tablet Take 325 mg by mouth at bedtime.      . Calcium-Vitamin D (CALTRATE 600 PLUS-VIT D PO) Take 1 tablet by mouth 2 (two) times daily.       Marland Kitchen docusate sodium (COLACE) 100 MG capsule Take 100 mg by mouth 3 (three) times daily as needed for constipation.      Marland Kitchen estradiol (ESTRACE) 0.5 MG tablet  Take 0.5 tablets (0.25 mg total) by mouth daily.  90 tablet  3  . levothyroxine (SYNTHROID, LEVOTHROID) 200 MCG tablet Take 200 mcg by mouth daily before breakfast.      . medroxyPROGESTERone (PROVERA) 5 MG tablet Take 0.5 tablets (2.5 mg total) by mouth daily. 2.5 mg tab daily  90 tablet  2  . Multiple Vitamins-Minerals (MULTI COMPLETE PO) Take by mouth daily.       . niacin (NIASPAN) 1000 MG CR tablet Take 1,000 mg by mouth at bedtime.       . nortriptyline (PAMELOR) 25 MG capsule Take 75 mg by mouth every morning.       Marland Kitchen omeprazole (PRILOSEC) 40 MG capsule Take 40 mg by mouth 2 (two) times daily.       . simvastatin (ZOCOR) 20 MG tablet Take 20 mg by mouth at bedtime.       . vitamin B-12 (CYANOCOBALAMIN) 1000 MCG tablet Take 1,000 mcg by mouth daily.      . vitamin C (ASCORBIC ACID) 500 MG tablet Take 500 mg by mouth daily.       . Vitamin D, Ergocalciferol, (DRISDOL) 50000 UNITS CAPS capsule Take 1 capsule (50,000 Units total) by mouth every 14 (fourteen) days.  26 capsule  1  . vitamin E 400 UNIT capsule Take 400 Units by mouth 2 (two) times daily.       Marland Kitchen zolpidem (AMBIEN) 10 MG tablet Take 10 mg by mouth at bedtime.       No current facility-administered medications for this visit.    Family History  Problem Relation Age of Onset  . Heart disease Mother     Enlarged heart  . Arrhythmia Mother   . Diabetes Mother   . Stroke Sister   . Heart disease Brother     MI at unknown age  . Anesthesia problems Brother   . Heart attack      MI at age 44  . Heart disease Brother     CABG  . Heart disease Father   . Cancer Son     non-hodgkins lymphoma    ROS:  Pertinent items are noted in HPI.  Otherwise, a comprehensive ROS was negative.  Exam:   BP 140/72  Pulse 80  Resp 20  Ht 5\' 6"  (1.676 m)  Wt 134 lb (60.782 kg)  BMI 21.64 kg/m2  LMP 01/17/1994 Height: 5\' 6"  (167.6 cm)  Ht Readings from Last 3 Encounters:  07/29/13 5\' 6"  (1.676 m)  07/02/13 5\' 6"  (1.676 m)  03/08/13  5\' 6"  (1.676 m)    General appearance: alert, cooperative and appears stated age Head: Normocephalic, without obvious abnormality, atraumatic Neck: no adenopathy, supple, symmetrical, trachea midline and thyroid normal to inspection and palpation Lungs: clear to auscultation bilaterally Breasts: normal appearance, no masses or tenderness Heart: regular rate and rhythm Abdomen: soft, non-tender; no masses,  no organomegaly Extremities: extremities normal, atraumatic, no cyanosis or edema Skin: Skin color, texture, turgor normal. No rashes or lesions Lymph nodes: Cervical, supraclavicular, and axillary nodes normal. No abnormal inguinal nodes palpated Neurologic: Grossly normal   Pelvic: External genitalia:  no lesions              Urethra:  normal appearing urethra with no masses, tenderness or lesions              Bartholin's and Skene's: normal                 Vagina: normal appearing vagina with normal color and discharge, no lesions              Cervix: anteverted              Pap taken: No. Bimanual Exam:  Uterus:  normal size, contour, position, consistency, mobility, non-tender              Adnexa: no mass, fullness, tenderness               Rectovaginal: Confirms               Anus:  normal sphincter tone, no lesions  A:  Well Woman with normal exam  Postmenopausal on HRT  R/O UTI  History of osteoporosis off Forteo X 2 years  S/P colon cancer 2003  S/P lap Chole 11/2012     P:   Reviewed health and wellness pertinent to exam  Pap smear not taken today  Mammogram is due 11/15  Call with urine culture results - no treatment started ` BMD is to be done in September and PCP will make other recommendations about treatment options  return annually or prn  Refill on HRT for a year - on the lowest dose at Estradiol 0.5 at 1/2 tab and Provera 5 mg 1/2 tab daily  Counseled with risk of DVT, CVA, cancer, etc.  Counseled with exercise as tolerated, well balance diet, calcium,  Vit D and avoidance of falls.  An After Visit Summary was printed and given to the patient.

## 2013-07-29 NOTE — Patient Instructions (Signed)

## 2013-07-30 LAB — URINE CULTURE
Colony Count: NO GROWTH
ORGANISM ID, BACTERIA: NO GROWTH

## 2013-08-01 NOTE — Progress Notes (Signed)
Note reviewed, agree with plan.  Cecylia Brazill, MD  

## 2013-10-29 ENCOUNTER — Other Ambulatory Visit: Payer: Self-pay

## 2013-10-29 DIAGNOSIS — Z1231 Encounter for screening mammogram for malignant neoplasm of breast: Secondary | ICD-10-CM

## 2013-11-18 ENCOUNTER — Encounter: Payer: Self-pay | Admitting: Nurse Practitioner

## 2013-12-04 ENCOUNTER — Ambulatory Visit
Admission: RE | Admit: 2013-12-04 | Discharge: 2013-12-04 | Disposition: A | Discharge status: Home / Self Care | Payer: Medicare Other | Source: Ambulatory Visit

## 2013-12-04 DIAGNOSIS — Z1231 Encounter for screening mammogram for malignant neoplasm of breast: Secondary | ICD-10-CM

## 2014-01-01 ENCOUNTER — Other Ambulatory Visit: Payer: Self-pay | Admitting: Endocrinology

## 2014-01-01 DIAGNOSIS — E049 Nontoxic goiter, unspecified: Secondary | ICD-10-CM

## 2014-06-25 ENCOUNTER — Ambulatory Visit
Admission: RE | Admit: 2014-06-25 | Discharge: 2014-06-25 | Disposition: A | Discharge status: Home / Self Care | Payer: Medicare Other | Source: Ambulatory Visit | Attending: Endocrinology | Admitting: Endocrinology

## 2014-06-25 ENCOUNTER — Other Ambulatory Visit: Payer: Medicare Other

## 2014-06-25 DIAGNOSIS — E049 Nontoxic goiter, unspecified: Secondary | ICD-10-CM

## 2014-07-08 ENCOUNTER — Other Ambulatory Visit: Payer: Self-pay | Admitting: Hematology and Oncology

## 2014-07-08 ENCOUNTER — Ambulatory Visit (HOSPITAL_BASED_OUTPATIENT_CLINIC_OR_DEPARTMENT_OTHER): Payer: Medicare Other | Admitting: Hematology and Oncology

## 2014-07-08 ENCOUNTER — Telehealth: Payer: Self-pay | Admitting: Hematology and Oncology

## 2014-07-08 ENCOUNTER — Encounter: Payer: Self-pay | Admitting: Hematology and Oncology

## 2014-07-08 ENCOUNTER — Other Ambulatory Visit (HOSPITAL_BASED_OUTPATIENT_CLINIC_OR_DEPARTMENT_OTHER): Payer: Medicare Other

## 2014-07-08 VITALS — BP 155/85 | HR 84 | Temp 98.1°F | Resp 18 | Ht 66.0 in | Wt 135.7 lb

## 2014-07-08 DIAGNOSIS — Z85038 Personal history of other malignant neoplasm of large intestine: Secondary | ICD-10-CM

## 2014-07-08 DIAGNOSIS — D7589 Other specified diseases of blood and blood-forming organs: Secondary | ICD-10-CM

## 2014-07-08 DIAGNOSIS — I10 Essential (primary) hypertension: Secondary | ICD-10-CM | POA: Diagnosis not present

## 2014-07-08 LAB — CBC & DIFF AND RETIC
BASO%: 0.1 % (ref 0.0–2.0)
Basophils Absolute: 0 10*3/uL (ref 0.0–0.1)
EOS%: 3.7 % (ref 0.0–7.0)
Eosinophils Absolute: 0.3 10*3/uL (ref 0.0–0.5)
HEMATOCRIT: 41.5 % (ref 34.8–46.6)
HGB: 13.7 g/dL (ref 11.6–15.9)
Immature Retic Fract: 4.9 % (ref 1.60–10.00)
LYMPH%: 18.3 % (ref 14.0–49.7)
MCH: 34.9 pg — AB (ref 25.1–34.0)
MCHC: 33 g/dL (ref 31.5–36.0)
MCV: 105.9 fL — ABNORMAL HIGH (ref 79.5–101.0)
MONO#: 0.8 10*3/uL (ref 0.1–0.9)
MONO%: 11.1 % (ref 0.0–14.0)
NEUT#: 4.6 10*3/uL (ref 1.5–6.5)
NEUT%: 66.8 % (ref 38.4–76.8)
Platelets: 214 10*3/uL (ref 145–400)
RBC: 3.92 10*6/uL (ref 3.70–5.45)
RDW: 12.7 % (ref 11.2–14.5)
RETIC CT ABS: 41.16 10*3/uL (ref 33.70–90.70)
Retic %: 1.05 % (ref 0.70–2.10)
WBC: 7 10*3/uL (ref 3.9–10.3)
lymph#: 1.3 10*3/uL (ref 0.9–3.3)

## 2014-07-08 LAB — COMPREHENSIVE METABOLIC PANEL (CC13)
ALK PHOS: 71 U/L (ref 40–150)
ALT: 17 U/L (ref 0–55)
AST: 17 U/L (ref 5–34)
Albumin: 3.8 g/dL (ref 3.5–5.0)
Anion Gap: 6 mEq/L (ref 3–11)
BUN: 17.2 mg/dL (ref 7.0–26.0)
CALCIUM: 9.9 mg/dL (ref 8.4–10.4)
CO2: 32 mEq/L — ABNORMAL HIGH (ref 22–29)
Chloride: 103 mEq/L (ref 98–109)
Creatinine: 1 mg/dL (ref 0.6–1.1)
EGFR: 56 mL/min/{1.73_m2} — AB (ref >90)
GLUCOSE: 105 mg/dL (ref 70–140)
Potassium: 4.6 mEq/L (ref 3.5–5.1)
Sodium: 140 mEq/L (ref 136–145)
Total Bilirubin: 0.43 mg/dL (ref 0.20–1.20)
Total Protein: 7.2 g/dL (ref 6.4–8.3)

## 2014-07-08 LAB — LACTATE DEHYDROGENASE (CC13): LDH: 161 U/L (ref 125–245)

## 2014-07-08 NOTE — Assessment & Plan Note (Addendum)
She has been disease free for over 12 years. The patient wants to continue followup here and now see her once a year with history, physical examination and blood work. She will continue close follow-up with GI for surveillance colonoscopy as indicated.

## 2014-07-08 NOTE — Telephone Encounter (Signed)
per pof to sch pt appt-gave pt copy of avs °

## 2014-07-08 NOTE — Progress Notes (Signed)
[facility] OFFICE PROGRESS NOTE  Patient Care Team: Marjean Donna, MD as PCP - General (Family Medicine) Clarene Essex, MD as Consulting Physician (Gastroenterology) Herminio Commons, MD as Attending Physician (Cardiology)  SUMMARY OF ONCOLOGIC HISTORY:  I reviewed the patient's records extensive and collaborated the history with the patient. Summary of her history is as follows: She was diagnosed with stage IIIB pT3 N1 M0 adenocarcinoma of the right colon after presentation was of the anemia. She had resection in September 2003.  She received adjuvant chemotherapy with 5-FU, irinotecan, leucovorin which finished in April 2004 by Dr. Burney Gauze.  Last surveillance colonoscopy with Dr. Watt Climes was reportedly negative in September 2012.   INTERVAL HISTORY: Please see below for problem oriented charting. She feels well. Denies any changes in bowel habits. The patient denies any recent signs or symptoms of bleeding such as spontaneous epistaxis, hematuria or hematochezia. Denies recent infection.  REVIEW OF SYSTEMS:   Constitutional: Denies fevers, chills or abnormal weight loss Eyes: Denies blurriness of vision Ears, nose, mouth, throat, and face: Denies mucositis or sore throat Respiratory: Denies cough, dyspnea or wheezes Cardiovascular: Denies palpitation, chest discomfort or lower extremity swelling Gastrointestinal:  Denies nausea, heartburn or change in bowel habits Skin: Denies abnormal skin rashes Lymphatics: Denies new lymphadenopathy or easy bruising Neurological:Denies numbness, tingling or new weaknesses Behavioral/Psych: Mood is stable, no new changes  All other systems were reviewed with the patient and are negative.  I have reviewed the past medical history, past surgical history, social history and family history with the patient and they are unchanged from previous note.  ALLERGIES:  is allergic to tape; latex; lidocaine; and sulfur.  MEDICATIONS:   Current Outpatient Prescriptions  Medication Sig Dispense Refill  . acetaminophen (TYLENOL) 500 MG tablet Take 1,000 mg by mouth 2 (two) times daily.     Marland Kitchen amLODipine (NORVASC) 5 MG tablet Take 5 mg by mouth every morning.     Marland Kitchen aspirin 325 MG tablet Take 325 mg by mouth at bedtime.    . Calcium-Vitamin D (CALTRATE 600 PLUS-VIT D PO) Take 1 tablet by mouth 2 (two) times daily.     Marland Kitchen docusate sodium (COLACE) 100 MG capsule Take 100 mg by mouth 3 (three) times daily as needed for constipation.    Marland Kitchen estradiol (ESTRACE) 0.5 MG tablet Take 0.5 tablets (0.25 mg total) by mouth daily. 90 tablet 3  . levothyroxine (SYNTHROID, LEVOTHROID) 200 MCG tablet Take 200 mcg by mouth daily before breakfast.    . medroxyPROGESTERone (PROVERA) 5 MG tablet Take 0.5 tablets (2.5 mg total) by mouth daily. 2.5 mg tab daily 90 tablet 2  . Multiple Vitamins-Minerals (CENTRUM SILVER ULTRA WOMENS PO) Take by mouth.    . niacin (NIASPAN) 1000 MG CR tablet Take 1,000 mg by mouth at bedtime.     . nortriptyline (PAMELOR) 25 MG capsule Take 75 mg by mouth every morning.     Marland Kitchen omeprazole (PRILOSEC) 20 MG capsule Take 20 mg by mouth 2 (two) times daily.    . simvastatin (ZOCOR) 20 MG tablet Take 20 mg by mouth at bedtime.     . vitamin B-12 (CYANOCOBALAMIN) 1000 MCG tablet Take 1,000 mcg by mouth daily.    . vitamin C (ASCORBIC ACID) 500 MG tablet Take 500 mg by mouth daily.     . Vitamin D, Ergocalciferol, (DRISDOL) 50000 UNITS CAPS capsule Take 1 capsule (50,000 Units total) by mouth every 14 (fourteen) days. 26 capsule 1  . vitamin  E 400 UNIT capsule Take 400 Units by mouth 2 (two) times daily.     Marland Kitchen zolpidem (AMBIEN) 10 MG tablet Take 10 mg by mouth at bedtime.     No current facility-administered medications for this visit.    PHYSICAL EXAMINATION: ECOG PERFORMANCE STATUS: 0 - Asymptomatic  Filed Vitals:   07/08/14 1011  BP: 155/85  Pulse: 84  Temp: 98.1 F (36.7 C)  Resp: 18   Filed Weights   07/08/14 1011   Weight: 135 lb 11.2 oz (61.553 kg)    GENERAL:alert, no distress and comfortable SKIN: skin color, texture, turgor are normal, no rashes or significant lesions EYES: normal, Conjunctiva are pink and non-injected, sclera clear OROPHARYNX:no exudate, no erythema and lips, buccal mucosa, and tongue normal  NECK: supple, thyroid normal size, non-tender, without nodularity LYMPH:  no palpable lymphadenopathy in the cervical, axillary or inguinal LUNGS: clear to auscultation and percussion with normal breathing effort HEART: regular rate & rhythm and no murmurs and no lower extremity edema ABDOMEN:abdomen soft, non-tender and normal bowel sounds Musculoskeletal:no cyanosis of digits and no clubbing  NEURO: alert & oriented x 3 with fluent speech, no focal motor/sensory deficits  LABORATORY DATA:  I have reviewed the data as listed    Component Value Date/Time   NA 140 07/08/2014 0959   NA 129* 11/19/2012 0415   NA 146* 06/29/2011 1028   K 4.6 07/08/2014 0959   K 3.9 11/19/2012 0415   K 5.1* 06/29/2011 1028   CL 98 11/19/2012 0415   CL 101 07/02/2012 0935   CL 100 06/29/2011 1028   CO2 32* 07/08/2014 0959   CO2 24 11/19/2012 0415   CO2 32 06/29/2011 1028   GLUCOSE 105 07/08/2014 0959   GLUCOSE 138* 11/19/2012 0415   GLUCOSE 137* 07/02/2012 0935   GLUCOSE 136* 06/29/2011 1028   BUN 17.2 07/08/2014 0959   BUN 7 11/19/2012 0415   BUN 18 06/29/2011 1028   CREATININE 1.0 07/08/2014 0959   CREATININE 0.62 11/19/2012 0415   CREATININE 1.1 06/29/2011 1028   CALCIUM 9.9 07/08/2014 0959   CALCIUM 9.1 11/19/2012 0415   CALCIUM 9.1 06/29/2011 1028   PROT 7.2 07/08/2014 0959   PROT 6.3 11/18/2012 0825   PROT 8.0 06/29/2011 1028   ALBUMIN 3.8 07/08/2014 0959   ALBUMIN 2.6* 11/18/2012 0825   AST 17 07/08/2014 0959   AST 54* 11/18/2012 0825   AST 29 06/29/2011 1028   ALT 17 07/08/2014 0959   ALT 109* 11/18/2012 0825   ALT 24 06/29/2011 1028   ALKPHOS 71 07/08/2014 0959   ALKPHOS  203* 11/18/2012 0825   ALKPHOS 62 06/29/2011 1028   BILITOT 0.43 07/08/2014 0959   BILITOT 0.6 11/18/2012 0825   BILITOT 0.70 06/29/2011 1028   GFRNONAA 85* 11/19/2012 0415   GFRAA >90 11/19/2012 0415    No results found for: SPEP, UPEP  Lab Results  Component Value Date   WBC 7.0 07/08/2014   NEUTROABS 4.6 07/08/2014   HGB 13.7 07/08/2014   HCT 41.5 07/08/2014   MCV 105.9* 07/08/2014   PLT 214 07/08/2014      Chemistry      Component Value Date/Time   NA 140 07/08/2014 0959   NA 129* 11/19/2012 0415   NA 146* 06/29/2011 1028   K 4.6 07/08/2014 0959   K 3.9 11/19/2012 0415   K 5.1* 06/29/2011 1028   CL 98 11/19/2012 0415   CL 101 07/02/2012 0935   CL 100 06/29/2011 1028  CO2 32* 07/08/2014 0959   CO2 24 11/19/2012 0415   CO2 32 06/29/2011 1028   BUN 17.2 07/08/2014 0959   BUN 7 11/19/2012 0415   BUN 18 06/29/2011 1028   CREATININE 1.0 07/08/2014 0959   CREATININE 0.62 11/19/2012 0415   CREATININE 1.1 06/29/2011 1028      Component Value Date/Time   CALCIUM 9.9 07/08/2014 0959   CALCIUM 9.1 11/19/2012 0415   CALCIUM 9.1 06/29/2011 1028   ALKPHOS 71 07/08/2014 0959   ALKPHOS 203* 11/18/2012 0825   ALKPHOS 62 06/29/2011 1028   AST 17 07/08/2014 0959   AST 54* 11/18/2012 0825   AST 29 06/29/2011 1028   ALT 17 07/08/2014 0959   ALT 109* 11/18/2012 0825   ALT 24 06/29/2011 1028   BILITOT 0.43 07/08/2014 0959   BILITOT 0.6 11/18/2012 0825   BILITOT 0.70 06/29/2011 1028     ASSESSMENT & PLAN:  History of colon cancer, stage III She has been disease free for over 12 years. The patient wants to continue followup here and now see her once a year with history, physical examination and blood work. She will continue close follow-up with GI for surveillance colonoscopy as indicated.  Essential hypertension she will continue current medical management. I recommend close follow-up with primary care doctor for medication adjustment.   Macrocytosis without  anemia I suspect this is related to recent changes in her thyroid medications. Serum vitamin B12, hepatic panel and TSH were within normal limits.  There is no indication to proceed with bone marrow biopsy or further investigation at this point as she is not symptomatic.     Orders Placed This Encounter  Procedures  . CBC & Diff and Retic    Standing Status: Future     Number of Occurrences:      Standing Expiration Date: 08/12/2015  . Comprehensive metabolic panel    Standing Status: Future     Number of Occurrences:      Standing Expiration Date: 08/12/2015  . Lactate dehydrogenase    Standing Status: Future     Number of Occurrences:      Standing Expiration Date: 08/12/2015  . CEA    Standing Status: Future     Number of Occurrences:      Standing Expiration Date: 08/12/2015   All questions were answered. The patient knows to call the clinic with any problems, questions or concerns. No barriers to learning was detected. I spent 15 minutes counseling the patient face to face. The total time spent in the appointment was 20 minutes and more than 50% was on counseling and review of test results     [facility], Mayuri Staples, MD 07/08/2014 11:03 AM

## 2014-07-08 NOTE — Assessment & Plan Note (Signed)
I suspect this is related to recent changes in her thyroid medications. Serum vitamin B12, hepatic panel and TSH were within normal limits.  There is no indication to proceed with bone marrow biopsy or further investigation at this point as she is not symptomatic.

## 2014-07-08 NOTE — Assessment & Plan Note (Signed)
she will continue current medical management. I recommend close follow-up with primary care doctor for medication adjustment.  

## 2014-07-09 LAB — CEA: CEA: 3.4 ng/mL (ref 0.0–5.0)

## 2014-07-31 ENCOUNTER — Ambulatory Visit: Payer: Medicare Other | Admitting: Nurse Practitioner

## 2014-08-13 ENCOUNTER — Ambulatory Visit (INDEPENDENT_AMBULATORY_CARE_PROVIDER_SITE_OTHER): Payer: Medicare Other | Admitting: Nurse Practitioner

## 2014-08-13 ENCOUNTER — Encounter: Payer: Self-pay | Admitting: Nurse Practitioner

## 2014-08-13 VITALS — BP 122/70 | HR 84 | Resp 14 | Ht 65.75 in | Wt 133.0 lb

## 2014-08-13 DIAGNOSIS — Z01419 Encounter for gynecological examination (general) (routine) without abnormal findings: Secondary | ICD-10-CM | POA: Diagnosis not present

## 2014-08-13 DIAGNOSIS — E559 Vitamin D deficiency, unspecified: Secondary | ICD-10-CM | POA: Diagnosis not present

## 2014-08-13 DIAGNOSIS — I10 Essential (primary) hypertension: Secondary | ICD-10-CM

## 2014-08-13 DIAGNOSIS — E039 Hypothyroidism, unspecified: Secondary | ICD-10-CM

## 2014-08-13 MED ORDER — MEDROXYPROGESTERONE ACETATE 5 MG PO TABS: 2.5000 mg | ORAL_TABLET | Freq: Every day | ORAL | Status: DC

## 2014-08-13 MED ORDER — VITAMIN D (ERGOCALCIFEROL) 1.25 MG (50000 UNIT) PO CAPS
50000.0000 [IU] | ORAL_CAPSULE | ORAL | Status: DC
Start: 2014-08-13 — End: 2015-08-15

## 2014-08-13 MED ORDER — ESTRADIOL 0.5 MG PO TABS: 0.2500 mg | ORAL_TABLET | Freq: Every day | ORAL | Status: DC

## 2014-08-13 NOTE — Patient Instructions (Signed)

## 2014-08-13 NOTE — Progress Notes (Signed)
Patient ID: [name], female   DOB: Jan 14, 1933, 81 y.o.   MRN: [id number] 81 y.o. G1P1 Single  Caucasian Fe here for annual exam.  She is doing well and exercises 5-6 days a week.  She had a thyroid scan this year and nodules are a little smaller.  She remains on HRT at 1/2 dose daily - states she feels better emotionally on HRT.  Her friend who was in the nursing home passed away this past spring.  Patient's last menstrual period was 01/17/1994.          Sexually active: No.  The current method of family planning is post menopausal status.    Exercising: Yes.    walking Smoker:  no  Health Maintenance: Pap: 07/13/2009 negative  MMG: 12/04/13, Bi-Rads 1:  Negative  Colonoscopy: 10/11/10, polyp, repeat in 5 years BMD: 09/2011 T Score: Spine -0.5; left femur neck -2.8; total -2.3 completed 2 years of Forteo in June 2015. Will get a repeat BMD in September 2016 per pt.  TDaP: PCP maintains tetanus  Labs: PCP maintains labs.     reports that she has never smoked. She has never used smokeless tobacco. She reports that she does not drink alcohol or use illicit drugs.  Past Medical History  Diagnosis Date  . Hypertension   . Hypothyroid   . Goiter   . GERD (gastroesophageal reflux disease)   . Fibromyalgia   . Heart murmur   . Peripheral vascular disease     varicose veins in left arm   . Shortness of breath     with exertion   . Anemia     hx of in 2003   . Hiatal hernia   . Headache(784.0)     tension headache daily per pt   . Arthritis   . Depression   . DJD (degenerative joint disease)   . Osteoarthritis   . Colon cancer     s/p resecton of colon in 2003 , hx of chemo  . Headaches, cluster   . Abnormal uterine bleeding (AUB)     on cont HRT  . Colon cancer   . TMJ (dislocation of temporomandibular joint)   . Compression fracture jan 2012    due to fall  . PONV (postoperative nausea and vomiting)   . Choledocholithiasis with obstruction 11/16/2012   . Pancreatitis, acute 11/15/2012  . Calcification of aorta 11/16/2012  . Macular degeneration   . DM II (diabetes mellitus, type II), controlled 11/16/2012  . Blood transfusion without reported diagnosis     Past Surgical History  Procedure Laterality Date  . Tmj arthroplasty  1990's  . Appendectomy    . Colon resection  2003  . Eye surgery      bilateral cataract surgery   . Total knee arthroplasty  12/20/2010    Procedure: TOTAL KNEE ARTHROPLASTY;  Surgeon: Mauri Pole;  Location: [facility];  Service: Orthopedics;  Laterality: Right;  . Colonoscopy w/ biopsies  09/25/2002, 09/18/2001  . Upper gastrointestinal endoscopy  09/18/2001  . Porta catheter placement  10/30/2001  . Porta catheter removal  06/27/2003  . Tmj arthroplasty    . Dilation and curettage of uterus    . Hysteroscopy    . Esophagogastroduodenoscopy endoscopy    . Vertebroplasty  2012  . Back surgery  2000    lower  . Tonsillectomy  age 89 or 8  . Joint replacement Right dec 2012    knee  . Ercp N/A 11/14/2012  Procedure: ENDOSCOPIC RETROGRADE CHOLANGIOPANCREATOGRAPHY (ERCP);  Surgeon: Jeryl Columbia, MD;  Location: [facility];  Service: Endoscopy;  Laterality: N/A;  . Cholecystectomy N/A 11/17/2012    Procedure: LAPAROSCOPIC CHOLECYSTECTOMY;  Surgeon: Leighton Ruff, MD;  Location: [facility];  Service: General;  Laterality: N/A;    Current Outpatient Prescriptions  Medication Sig Dispense Refill  . acetaminophen (TYLENOL) 500 MG tablet Take 1,000 mg by mouth 2 (two) times daily.     Marland Kitchen amLODipine (NORVASC) 5 MG tablet Take 5 mg by mouth every morning.     Marland Kitchen aspirin 325 MG tablet Take 325 mg by mouth at bedtime.    . Calcium-Vitamin D (CALTRATE 600 PLUS-VIT D PO) Take 1 tablet by mouth 2 (two) times daily.     Marland Kitchen docusate sodium (COLACE) 100 MG capsule Take 100 mg by mouth 3 (three) times daily as needed for constipation.    Marland Kitchen estradiol (ESTRACE) 0.5 MG tablet Take 0.5 tablets (0.25 mg total) by mouth daily. 90 tablet 3   . levothyroxine (SYNTHROID, LEVOTHROID) 200 MCG tablet Take 200 mcg by mouth daily before breakfast.    . medroxyPROGESTERone (PROVERA) 5 MG tablet Take 0.5 tablets (2.5 mg total) by mouth daily. 2.5 mg tab daily 90 tablet 3  . Multiple Vitamins-Minerals (CENTRUM SILVER ULTRA WOMENS PO) Take by mouth.    . niacin (NIASPAN) 1000 MG CR tablet Take 1,000 mg by mouth at bedtime.     . nortriptyline (PAMELOR) 25 MG capsule Take 75 mg by mouth every morning.     Marland Kitchen omeprazole (PRILOSEC) 20 MG capsule Take 20 mg by mouth 2 (two) times daily.    . simvastatin (ZOCOR) 20 MG tablet Take 20 mg by mouth at bedtime.     . vitamin B-12 (CYANOCOBALAMIN) 1000 MCG tablet Take 1,000 mcg by mouth daily.    . vitamin C (ASCORBIC ACID) 500 MG tablet Take 500 mg by mouth daily.     . Vitamin D, Ergocalciferol, (DRISDOL) 50000 UNITS CAPS capsule Take 1 capsule (50,000 Units total) by mouth every 14 (fourteen) days. 26 capsule 1  . vitamin E 400 UNIT capsule Take 400 Units by mouth 2 (two) times daily.     Marland Kitchen zolpidem (AMBIEN) 10 MG tablet Take 10 mg by mouth at bedtime.     No current facility-administered medications for this visit.    Family History  Problem Relation Age of Onset  . Heart disease Mother     Enlarged heart  . Arrhythmia Mother   . Diabetes Mother   . Stroke Sister   . Heart disease Brother     MI at unknown age  . Anesthesia problems Brother   . Heart attack      MI at age 34  . Heart disease Brother     CABG  . Heart disease Father   . Cancer Son     non-hodgkins lymphoma    ROS:  Pertinent items are noted in HPI.  Otherwise, a comprehensive ROS was negative.  Exam:   BP 122/70 mmHg  Pulse 84  Resp 14  Ht 5' 5.75" (1.67 m)  Wt 133 lb (60.328 kg)  BMI 21.63 kg/m2  LMP 01/17/1994 Height: 5' 5.75" (167 cm) Ht Readings from Last 3 Encounters:  08/13/14 5' 5.75" (1.67 m)  07/08/14 5\' 6"  (1.676 m)  07/29/13 5\' 6"  (1.676 m)    General appearance: alert, cooperative and appears  stated age Head: Normocephalic, without obvious abnormality, atraumatic Neck: no adenopathy, supple, symmetrical, trachea midline  and thyroid enlarged and nodular Lungs: clear to auscultation bilaterally Breasts: normal appearance, no masses or tenderness Heart: regular rate and rhythm Abdomen: soft, non-tender; no masses,  no organomegaly Extremities: extremities normal, atraumatic, no cyanosis or edema Skin: Skin color, texture, turgor normal. No rashes or lesions Lymph nodes: Cervical, supraclavicular, and axillary nodes normal. No abnormal inguinal nodes palpated Neurologic: Grossly normal   Pelvic: External genitalia:  no lesions              Urethra:  normal appearing urethra with no masses, tenderness or lesions              Bartholin's and Skene's: normal                 Vagina: normal appearing vagina with normal color and discharge, no lesions              Cervix: anteverted              Pap taken: No. Bimanual Exam:  Uterus:  normal size, contour, position, consistency, mobility, non-tender              Adnexa: no mass, fullness, tenderness               Rectovaginal: Confirms               Anus:  normal sphincter tone, no lesions  Chaperone present:  no  A:  Well Woman with normal exam  Postmenopausal on HRT History of osteoporosis off Forteo X 2 years for recheck BMD this fall S/P colon cancer 2003 S/P lap Chole 11/2012  History of Vit D deficiency   P:   Reviewed health and wellness pertinent to exam  Pap smear as above  Mammogram is due 11/16  Refill Vit D and follow with labs  Refill on Estrace and Provera - both is taken at 1/2 tablet.  Counseled again about risk of DVT,CVA, cancer,etc - emotionally she feels better on HRT but is willing this fall to taper again to every other day then off by end of year.  Counseled on breast self exam, mammography screening, use and side effects of HRT, adequate intake of calcium and  vitamin D, diet and exercise return annually or prn  An After Visit Summary was printed and given to the patient.

## 2014-08-14 LAB — VITAMIN D 25 HYDROXY (VIT D DEFICIENCY, FRACTURES): Vit D, 25-Hydroxy: 37 ng/mL (ref 30–100)

## 2014-08-16 NOTE — Progress Notes (Signed)
Encounter reviewed by Dr. Aundria Rud. I support discontinuation of hormonal therapy.

## 2014-11-05 ENCOUNTER — Other Ambulatory Visit: Payer: Self-pay

## 2014-11-05 DIAGNOSIS — Z1231 Encounter for screening mammogram for malignant neoplasm of breast: Secondary | ICD-10-CM

## 2014-11-19 ENCOUNTER — Other Ambulatory Visit (HOSPITAL_COMMUNITY): Payer: Self-pay | Admitting: Family Medicine

## 2014-11-19 ENCOUNTER — Ambulatory Visit (HOSPITAL_COMMUNITY)
Admission: RE | Admit: 2014-11-19 | Discharge: 2014-11-19 | Disposition: A | Discharge status: Home / Self Care | Payer: Medicare Other | Source: Ambulatory Visit | Attending: Family Medicine | Admitting: Family Medicine

## 2014-11-19 DIAGNOSIS — R000 Tachycardia, unspecified: Secondary | ICD-10-CM

## 2014-12-10 ENCOUNTER — Ambulatory Visit
Admission: RE | Admit: 2014-12-10 | Discharge: 2014-12-10 | Disposition: A | Discharge status: Home / Self Care | Payer: Medicare Other | Source: Ambulatory Visit

## 2014-12-10 DIAGNOSIS — Z1231 Encounter for screening mammogram for malignant neoplasm of breast: Secondary | ICD-10-CM

## 2015-04-08 ENCOUNTER — Telehealth: Payer: Self-pay | Admitting: Nurse Practitioner

## 2015-04-08 NOTE — Telephone Encounter (Signed)
Spoke with patient. Patient states that she came off of her HRT in September 2016. Since then she has been having 2-3 hot flashes per day. Is requesting recommendations for OTC medications to reduce her hot flashes. Advised I will speak with Kem Boroughs, FNP and return call with further recommendations. She is agreeable.  Kem Boroughs, FNP is it okay for this patient to use [employer] or [place]?

## 2015-04-08 NOTE — Telephone Encounter (Signed)
Patient wants to speak with the nurse. She states she is having hot flashes and wants to know what she can take over the counter.

## 2015-04-08 NOTE — Telephone Encounter (Signed)
Agree with recommendations for OTC [employer]

## 2015-04-08 NOTE — Telephone Encounter (Signed)
Spoke with patient. Advised of message as seen below from Kem Boroughs, [place]. She is agreeable and verbalizes understanding.  Routing to provider for final review. Patient agreeable to disposition. Will close encounter.

## 2015-05-30 ENCOUNTER — Inpatient Hospital Stay (HOSPITAL_COMMUNITY): Payer: Medicare Other | Admitting: Anesthesiology

## 2015-05-30 ENCOUNTER — Emergency Department (HOSPITAL_COMMUNITY): Payer: Medicare Other

## 2015-05-30 ENCOUNTER — Inpatient Hospital Stay (HOSPITAL_COMMUNITY): Payer: Medicare Other

## 2015-05-30 ENCOUNTER — Encounter (HOSPITAL_COMMUNITY)
Admission: EM | Disposition: A | Discharge status: Skilled Nursing Facility | Payer: Self-pay | Source: Home / Self Care | Attending: Internal Medicine

## 2015-05-30 ENCOUNTER — Encounter (HOSPITAL_COMMUNITY): Payer: Self-pay | Admitting: Emergency Medicine

## 2015-05-30 ENCOUNTER — Inpatient Hospital Stay (HOSPITAL_COMMUNITY)
Admission: EM | Admit: 2015-05-30 | Discharge: 2015-06-01 | DRG: 470 | Disposition: A | Discharge status: Skilled Nursing Facility | Payer: Medicare Other | Attending: Internal Medicine | Admitting: Internal Medicine

## 2015-05-30 DIAGNOSIS — Z96651 Presence of right artificial knee joint: Secondary | ICD-10-CM | POA: Diagnosis present

## 2015-05-30 DIAGNOSIS — I10 Essential (primary) hypertension: Secondary | ICD-10-CM | POA: Diagnosis present

## 2015-05-30 DIAGNOSIS — S72001A Fracture of unspecified part of neck of right femur, initial encounter for closed fracture: Secondary | ICD-10-CM

## 2015-05-30 DIAGNOSIS — Z9841 Cataract extraction status, right eye: Secondary | ICD-10-CM | POA: Diagnosis not present

## 2015-05-30 DIAGNOSIS — W19XXXA Unspecified fall, initial encounter: Secondary | ICD-10-CM | POA: Diagnosis present

## 2015-05-30 DIAGNOSIS — H3530 Unspecified macular degeneration: Secondary | ICD-10-CM | POA: Diagnosis present

## 2015-05-30 DIAGNOSIS — D62 Acute posthemorrhagic anemia: Secondary | ICD-10-CM | POA: Diagnosis not present

## 2015-05-30 DIAGNOSIS — S72031A Displaced midcervical fracture of right femur, initial encounter for closed fracture: Principal | ICD-10-CM | POA: Diagnosis present

## 2015-05-30 DIAGNOSIS — Z01818 Encounter for other preprocedural examination: Secondary | ICD-10-CM

## 2015-05-30 DIAGNOSIS — S7291XA Unspecified fracture of right femur, initial encounter for closed fracture: Secondary | ICD-10-CM | POA: Diagnosis present

## 2015-05-30 DIAGNOSIS — R079 Chest pain, unspecified: Secondary | ICD-10-CM | POA: Diagnosis present

## 2015-05-30 DIAGNOSIS — D72829 Elevated white blood cell count, unspecified: Secondary | ICD-10-CM | POA: Diagnosis not present

## 2015-05-30 DIAGNOSIS — R0902 Hypoxemia: Secondary | ICD-10-CM | POA: Diagnosis present

## 2015-05-30 DIAGNOSIS — Z7982 Long term (current) use of aspirin: Secondary | ICD-10-CM | POA: Diagnosis not present

## 2015-05-30 DIAGNOSIS — I739 Peripheral vascular disease, unspecified: Secondary | ICD-10-CM | POA: Diagnosis present

## 2015-05-30 DIAGNOSIS — E039 Hypothyroidism, unspecified: Secondary | ICD-10-CM | POA: Diagnosis present

## 2015-05-30 DIAGNOSIS — E119 Type 2 diabetes mellitus without complications: Secondary | ICD-10-CM | POA: Diagnosis present

## 2015-05-30 DIAGNOSIS — Z9842 Cataract extraction status, left eye: Secondary | ICD-10-CM

## 2015-05-30 DIAGNOSIS — M26609 Unspecified temporomandibular joint disorder, unspecified side: Secondary | ICD-10-CM | POA: Diagnosis present

## 2015-05-30 DIAGNOSIS — E785 Hyperlipidemia, unspecified: Secondary | ICD-10-CM | POA: Diagnosis present

## 2015-05-30 DIAGNOSIS — Z9221 Personal history of antineoplastic chemotherapy: Secondary | ICD-10-CM | POA: Diagnosis not present

## 2015-05-30 DIAGNOSIS — W010XXA Fall on same level from slipping, tripping and stumbling without subsequent striking against object, initial encounter: Secondary | ICD-10-CM | POA: Diagnosis present

## 2015-05-30 DIAGNOSIS — M797 Fibromyalgia: Secondary | ICD-10-CM | POA: Diagnosis present

## 2015-05-30 DIAGNOSIS — Y92009 Unspecified place in unspecified non-institutional (private) residence as the place of occurrence of the external cause: Secondary | ICD-10-CM

## 2015-05-30 DIAGNOSIS — R0789 Other chest pain: Secondary | ICD-10-CM

## 2015-05-30 DIAGNOSIS — M1990 Unspecified osteoarthritis, unspecified site: Secondary | ICD-10-CM | POA: Diagnosis present

## 2015-05-30 DIAGNOSIS — K219 Gastro-esophageal reflux disease without esophagitis: Secondary | ICD-10-CM | POA: Diagnosis present

## 2015-05-30 DIAGNOSIS — Z85038 Personal history of other malignant neoplasm of large intestine: Secondary | ICD-10-CM | POA: Diagnosis present

## 2015-05-30 DIAGNOSIS — M79604 Pain in right leg: Secondary | ICD-10-CM | POA: Diagnosis present

## 2015-05-30 DIAGNOSIS — E038 Other specified hypothyroidism: Secondary | ICD-10-CM

## 2015-05-30 DIAGNOSIS — Z419 Encounter for procedure for purposes other than remedying health state, unspecified: Secondary | ICD-10-CM

## 2015-05-30 DIAGNOSIS — Z09 Encounter for follow-up examination after completed treatment for conditions other than malignant neoplasm: Secondary | ICD-10-CM

## 2015-05-30 HISTORY — PX: TOTAL HIP ARTHROPLASTY: SHX124

## 2015-05-30 LAB — CBC WITH DIFFERENTIAL/PLATELET
BASOS ABS: 0 10*3/uL (ref 0.0–0.1)
BASOS PCT: 0 %
EOS ABS: 0.1 10*3/uL (ref 0.0–0.7)
EOS PCT: 1 %
HCT: 42.4 % (ref 36.0–46.0)
Hemoglobin: 13.5 g/dL (ref 12.0–15.0)
Lymphocytes Relative: 13 %
Lymphs Abs: 1.3 10*3/uL (ref 0.7–4.0)
MCH: 32.7 pg (ref 26.0–34.0)
MCHC: 31.8 g/dL (ref 30.0–36.0)
MCV: 102.7 fL — AB (ref 78.0–100.0)
MONO ABS: 0.8 10*3/uL (ref 0.1–1.0)
Monocytes Relative: 8 %
Neutro Abs: 8 10*3/uL — ABNORMAL HIGH (ref 1.7–7.7)
Neutrophils Relative %: 78 %
PLATELETS: 238 10*3/uL (ref 150–400)
RBC: 4.13 MIL/uL (ref 3.87–5.11)
RDW: 12.3 % (ref 11.5–15.5)
WBC: 10.3 10*3/uL (ref 4.0–10.5)

## 2015-05-30 LAB — COMPREHENSIVE METABOLIC PANEL
ALBUMIN: 3.5 g/dL (ref 3.5–5.0)
ALT: 25 U/L (ref 14–54)
AST: 33 U/L (ref 15–41)
Alkaline Phosphatase: 58 U/L (ref 38–126)
Anion gap: 9 (ref 5–15)
BUN: 16 mg/dL (ref 6–20)
CHLORIDE: 103 mmol/L (ref 101–111)
CO2: 24 mmol/L (ref 22–32)
Calcium: 9.2 mg/dL (ref 8.9–10.3)
Creatinine, Ser: 0.87 mg/dL (ref 0.44–1.00)
GFR calc Af Amer: >60 mL/min (ref >60)
GFR calc non Af Amer: >60 mL/min (ref >60)
Glucose, Bld: 110 mg/dL — ABNORMAL HIGH (ref 65–99)
POTASSIUM: 4.5 mmol/L (ref 3.5–5.1)
SODIUM: 136 mmol/L (ref 135–145)
Total Bilirubin: 0.5 mg/dL (ref 0.3–1.2)
Total Protein: 6.7 g/dL (ref 6.5–8.1)

## 2015-05-30 LAB — TYPE AND SCREEN
ABO/RH(D): A NEG
ANTIBODY SCREEN: NEGATIVE

## 2015-05-30 LAB — SURGICAL PCR SCREEN
MRSA, PCR: NEGATIVE
STAPHYLOCOCCUS AUREUS: NEGATIVE

## 2015-05-30 LAB — URINALYSIS, ROUTINE W REFLEX MICROSCOPIC
BILIRUBIN URINE: NEGATIVE
Glucose, UA: NEGATIVE mg/dL
Hgb urine dipstick: NEGATIVE
Ketones, ur: 15 mg/dL — AB
Nitrite: NEGATIVE
PH: 7.5 (ref 5.0–8.0)
Protein, ur: NEGATIVE mg/dL
Specific Gravity, Urine: 1.015 (ref 1.005–1.030)

## 2015-05-30 LAB — GLUCOSE, CAPILLARY
GLUCOSE-CAPILLARY: 133 mg/dL — AB (ref 65–99)
GLUCOSE-CAPILLARY: 148 mg/dL — AB (ref 65–99)

## 2015-05-30 LAB — URINE MICROSCOPIC-ADD ON

## 2015-05-30 LAB — CALCIUM: Calcium: 9 mg/dL (ref 8.9–10.3)

## 2015-05-30 LAB — I-STAT TROPONIN, ED: TROPONIN I, POC: 0 ng/mL (ref 0.00–0.08)

## 2015-05-30 LAB — TROPONIN I

## 2015-05-30 LAB — ALBUMIN: ALBUMIN: 3.7 g/dL (ref 3.5–5.0)

## 2015-05-30 SURGERY — ARTHROPLASTY, HIP, TOTAL, ANTERIOR APPROACH
Anesthesia: General | Site: Hip | Laterality: Right

## 2015-05-30 MED ORDER — BISACODYL 10 MG RE SUPP
10.0000 mg | Freq: Every day | RECTAL | Status: DC | PRN
  Administered 2015-06-01: 10 mg via RECTAL
  Filled 2015-05-30: qty 1

## 2015-05-30 MED ORDER — SODIUM CHLORIDE 0.9 % IV SOLN
INTRAVENOUS | Status: DC | PRN
  Administered 2015-05-30: 200 mL via INTRAMUSCULAR

## 2015-05-30 MED ORDER — HYDROMORPHONE HCL 1 MG/ML IJ SOLN
0.5000 mg | INTRAMUSCULAR | Status: DC | PRN
  Administered 2015-05-30 (×2): 0.5 mg via INTRAVENOUS

## 2015-05-30 MED ORDER — HYDROMORPHONE HCL 1 MG/ML IJ SOLN
INTRAMUSCULAR | Status: AC
  Filled 2015-05-30: qty 1

## 2015-05-30 MED ORDER — ONDANSETRON HCL 4 MG/2ML IJ SOLN: 4.0000 mg | Freq: Four times a day (QID) | INTRAMUSCULAR | Status: DC | PRN

## 2015-05-30 MED ORDER — HYDROCODONE-ACETAMINOPHEN 5-325 MG PO TABS
1.0000 | ORAL_TABLET | Freq: Four times a day (QID) | ORAL | Status: DC | PRN
  Administered 2015-05-31: 1 via ORAL
  Filled 2015-05-30: qty 1

## 2015-05-30 MED ORDER — PROCHLORPERAZINE EDISYLATE 5 MG/ML IJ SOLN
10.0000 mg | Freq: Four times a day (QID) | INTRAMUSCULAR | Status: DC | PRN
  Filled 2015-05-30: qty 2

## 2015-05-30 MED ORDER — NITROGLYCERIN 0.4 MG SL SUBL
0.4000 mg | SUBLINGUAL_TABLET | SUBLINGUAL | Status: DC | PRN
  Administered 2015-05-30: 0.4 mg via SUBLINGUAL
  Filled 2015-05-30: qty 1

## 2015-05-30 MED ORDER — FLEET ENEMA 7-19 GM/118ML RE ENEM: 1.0000 | ENEMA | Freq: Once | RECTAL | Status: DC | PRN

## 2015-05-30 MED ORDER — ONDANSETRON HCL 4 MG/2ML IJ SOLN
INTRAMUSCULAR | Status: DC | PRN
  Administered 2015-05-30: 4 mg via INTRAVENOUS

## 2015-05-30 MED ORDER — ONDANSETRON HCL 4 MG/2ML IJ SOLN
4.0000 mg | Freq: Once | INTRAMUSCULAR | Status: AC | PRN
  Administered 2015-05-30: 4 mg via INTRAVENOUS

## 2015-05-30 MED ORDER — POLYETHYLENE GLYCOL 3350 17 G PO PACK: 17.0000 g | PACK | Freq: Every day | ORAL | Status: DC | PRN

## 2015-05-30 MED ORDER — BUPIVACAINE-EPINEPHRINE (PF) 0.5% -1:200000 IJ SOLN
INTRAMUSCULAR | Status: AC
  Filled 2015-05-30: qty 30

## 2015-05-30 MED ORDER — ACETAMINOPHEN 325 MG PO TABS: 650.0000 mg | ORAL_TABLET | Freq: Four times a day (QID) | ORAL | Status: DC | PRN

## 2015-05-30 MED ORDER — CEFAZOLIN SODIUM-DEXTROSE 2-4 GM/100ML-% IV SOLN
2.0000 g | Freq: Four times a day (QID) | INTRAVENOUS | Status: AC
  Administered 2015-05-31 (×3): 2 g via INTRAVENOUS
  Filled 2015-05-30 (×2): qty 100

## 2015-05-30 MED ORDER — DOCUSATE SODIUM 100 MG PO CAPS: 100.0000 mg | ORAL_CAPSULE | Freq: Two times a day (BID) | ORAL | Status: DC

## 2015-05-30 MED ORDER — PROPOFOL 10 MG/ML IV BOLUS
INTRAVENOUS | Status: DC | PRN
  Administered 2015-05-30: 120 mg via INTRAVENOUS

## 2015-05-30 MED ORDER — MORPHINE SULFATE (PF) 2 MG/ML IV SOLN
0.5000 mg | INTRAVENOUS | Status: DC | PRN
  Administered 2015-05-30: 0.5 mg via INTRAVENOUS
  Filled 2015-05-30: qty 1

## 2015-05-30 MED ORDER — HYDROCODONE-ACETAMINOPHEN 5-325 MG PO TABS: 1.0000 | ORAL_TABLET | Freq: Four times a day (QID) | ORAL | Status: DC | PRN

## 2015-05-30 MED ORDER — MENTHOL 3 MG MT LOZG: 1.0000 | LOZENGE | OROMUCOSAL | Status: DC | PRN

## 2015-05-30 MED ORDER — TRANEXAMIC ACID 1000 MG/10ML IV SOLN
1000.0000 mg | INTRAVENOUS | Status: AC
  Administered 2015-05-30: 1000 mg via INTRAVENOUS
  Filled 2015-05-30: qty 10

## 2015-05-30 MED ORDER — SODIUM CHLORIDE 0.9 % IV SOLN
INTRAVENOUS | Status: DC
  Administered 2015-05-30: 18:00:00 via INTRAVENOUS

## 2015-05-30 MED ORDER — CHLORHEXIDINE GLUCONATE 4 % EX LIQD
60.0000 mL | Freq: Once | CUTANEOUS | Status: DC
  Filled 2015-05-30: qty 60

## 2015-05-30 MED ORDER — LIDOCAINE HCL (CARDIAC) 20 MG/ML IV SOLN
INTRAVENOUS | Status: DC | PRN
  Administered 2015-05-30: 60 mg via INTRAVENOUS

## 2015-05-30 MED ORDER — METOCLOPRAMIDE HCL 5 MG/ML IJ SOLN
INTRAMUSCULAR | Status: AC
Start: 2015-05-30 — End: 2015-05-31
  Filled 2015-05-30: qty 2

## 2015-05-30 MED ORDER — GLYCOPYRROLATE 0.2 MG/ML IJ SOLN
INTRAMUSCULAR | Status: DC | PRN
  Administered 2015-05-30: 0.4 mg via INTRAVENOUS

## 2015-05-30 MED ORDER — PHENOL 1.4 % MT LIQD
1.0000 | OROMUCOSAL | Status: DC | PRN
Start: 2015-05-30 — End: 2015-06-01

## 2015-05-30 MED ORDER — NEOSTIGMINE METHYLSULFATE 10 MG/10ML IV SOLN
INTRAVENOUS | Status: DC | PRN
  Administered 2015-05-30: 3 mg via INTRAVENOUS

## 2015-05-30 MED ORDER — ACETAMINOPHEN 650 MG RE SUPP: 650.0000 mg | Freq: Four times a day (QID) | RECTAL | Status: DC | PRN

## 2015-05-30 MED ORDER — SODIUM CHLORIDE 0.9 % IJ SOLN
INTRAMUSCULAR | Status: DC | PRN
  Administered 2015-05-30: 30 mL via INTRAVENOUS

## 2015-05-30 MED ORDER — ZOLPIDEM TARTRATE 5 MG PO TABS
5.0000 mg | ORAL_TABLET | Freq: Every day | ORAL | Status: DC
  Administered 2015-05-31: 5 mg via ORAL
  Filled 2015-05-30: qty 1

## 2015-05-30 MED ORDER — TRANEXAMIC ACID 1000 MG/10ML IV SOLN
1000.0000 mg | INTRAVENOUS | Status: DC
  Filled 2015-05-30: qty 10

## 2015-05-30 MED ORDER — FERROUS SULFATE 325 (65 FE) MG PO TABS: 325.0000 mg | ORAL_TABLET | Freq: Three times a day (TID) | ORAL | Status: DC

## 2015-05-30 MED ORDER — ONDANSETRON HCL 4 MG PO TABS: 4.0000 mg | ORAL_TABLET | Freq: Four times a day (QID) | ORAL | Status: DC | PRN

## 2015-05-30 MED ORDER — ONDANSETRON HCL 4 MG/2ML IJ SOLN
INTRAMUSCULAR | Status: AC
  Filled 2015-05-30: qty 2

## 2015-05-30 MED ORDER — LEVOTHYROXINE SODIUM 100 MCG PO TABS
200.0000 ug | ORAL_TABLET | Freq: Every day | ORAL | Status: DC
  Administered 2015-05-31 – 2015-06-01 (×2): 200 ug via ORAL
  Filled 2015-05-30 (×2): qty 2

## 2015-05-30 MED ORDER — POVIDONE-IODINE 10 % EX SWAB: 2.0000 "application " | Freq: Once | CUTANEOUS | Status: DC

## 2015-05-30 MED ORDER — METOCLOPRAMIDE HCL 5 MG/ML IJ SOLN
5.0000 mg | Freq: Three times a day (TID) | INTRAMUSCULAR | Status: DC | PRN
  Administered 2015-05-30: 10 mg via INTRAVENOUS

## 2015-05-30 MED ORDER — DOCUSATE SODIUM 100 MG PO CAPS
100.0000 mg | ORAL_CAPSULE | Freq: Two times a day (BID) | ORAL | Status: DC
  Administered 2015-05-31 – 2015-06-01 (×2): 100 mg via ORAL
  Filled 2015-05-30 (×3): qty 1

## 2015-05-30 MED ORDER — LACTATED RINGERS IV SOLN
INTRAVENOUS | Status: DC | PRN
  Administered 2015-05-30 (×3): via INTRAVENOUS

## 2015-05-30 MED ORDER — KETOROLAC TROMETHAMINE 30 MG/ML IJ SOLN
INTRAMUSCULAR | Status: DC | PRN
  Administered 2015-05-30: 30 mg via INTRAMUSCULAR

## 2015-05-30 MED ORDER — CEFAZOLIN SODIUM-DEXTROSE 2-4 GM/100ML-% IV SOLN
2.0000 g | INTRAVENOUS | Status: AC
  Administered 2015-05-30: 2 g via INTRAVENOUS
  Filled 2015-05-30: qty 100

## 2015-05-30 MED ORDER — PROCHLORPERAZINE EDISYLATE 5 MG/ML IJ SOLN: 10.0000 mg | Freq: Once | INTRAMUSCULAR | Status: DC

## 2015-05-30 MED ORDER — ASPIRIN EC 81 MG PO TBEC
81.0000 mg | DELAYED_RELEASE_TABLET | Freq: Two times a day (BID) | ORAL | Status: DC
Start: 2015-05-31 — End: 2015-06-01
  Administered 2015-05-31 – 2015-06-01 (×3): 81 mg via ORAL
  Filled 2015-05-30 (×3): qty 1

## 2015-05-30 MED ORDER — KETOROLAC TROMETHAMINE 30 MG/ML IJ SOLN
INTRAMUSCULAR | Status: AC
  Filled 2015-05-30: qty 1

## 2015-05-30 MED ORDER — PROMETHAZINE HCL 25 MG/ML IJ SOLN
6.2500 mg | Freq: Once | INTRAMUSCULAR | Status: AC
  Administered 2015-05-30: 6.25 mg via INTRAVENOUS
  Filled 2015-05-30: qty 1

## 2015-05-30 MED ORDER — SODIUM CHLORIDE 0.9 % IJ SOLN
INTRAMUSCULAR | Status: DC | PRN
  Administered 2015-05-30: 20:00:00 via VAGINAL

## 2015-05-30 MED ORDER — NEBIVOLOL HCL 5 MG PO TABS
10.0000 mg | ORAL_TABLET | Freq: Every day | ORAL | Status: DC
  Administered 2015-05-30 – 2015-06-01 (×3): 10 mg via ORAL
  Filled 2015-05-30 (×3): qty 2

## 2015-05-30 MED ORDER — ONDANSETRON HCL 4 MG/2ML IJ SOLN
4.0000 mg | Freq: Four times a day (QID) | INTRAMUSCULAR | Status: DC | PRN
  Administered 2015-05-30: 4 mg via INTRAVENOUS
  Filled 2015-05-30: qty 2

## 2015-05-30 MED ORDER — PHENYLEPHRINE HCL 10 MG/ML IJ SOLN
INTRAMUSCULAR | Status: DC | PRN
  Administered 2015-05-30 (×4): 80 ug via INTRAVENOUS

## 2015-05-30 MED ORDER — NORTRIPTYLINE HCL 25 MG PO CAPS
75.0000 mg | ORAL_CAPSULE | Freq: Every morning | ORAL | Status: DC
  Administered 2015-05-31 – 2015-06-01 (×2): 75 mg via ORAL
  Filled 2015-05-30 (×2): qty 3

## 2015-05-30 MED ORDER — FENTANYL CITRATE (PF) 100 MCG/2ML IJ SOLN
50.0000 ug | Freq: Once | INTRAMUSCULAR | Status: AC
  Administered 2015-05-30: 50 ug via INTRAVENOUS
  Filled 2015-05-30: qty 2

## 2015-05-30 MED ORDER — ROCURONIUM BROMIDE 100 MG/10ML IV SOLN
INTRAVENOUS | Status: DC | PRN
  Administered 2015-05-30: 20 mg via INTRAVENOUS
  Administered 2015-05-30: 30 mg via INTRAVENOUS

## 2015-05-30 MED ORDER — SIMVASTATIN 20 MG PO TABS
20.0000 mg | ORAL_TABLET | Freq: Every day | ORAL | Status: DC
  Administered 2015-05-31: 20 mg via ORAL
  Filled 2015-05-30: qty 1

## 2015-05-30 MED ORDER — MAGNESIUM CITRATE PO SOLN: 1.0000 | Freq: Once | ORAL | Status: DC | PRN

## 2015-05-30 MED ORDER — PANTOPRAZOLE SODIUM 40 MG PO TBEC
40.0000 mg | DELAYED_RELEASE_TABLET | Freq: Every day | ORAL | Status: DC
  Administered 2015-05-31 – 2015-06-01 (×2): 40 mg via ORAL
  Filled 2015-05-30 (×2): qty 1

## 2015-05-30 MED ORDER — FENTANYL CITRATE (PF) 100 MCG/2ML IJ SOLN
INTRAMUSCULAR | Status: DC | PRN
  Administered 2015-05-30 (×3): 50 ug via INTRAVENOUS

## 2015-05-30 MED ORDER — SENNA 8.6 MG PO TABS
1.0000 | ORAL_TABLET | Freq: Two times a day (BID) | ORAL | Status: DC
  Administered 2015-05-31 – 2015-06-01 (×2): 8.6 mg via ORAL
  Filled 2015-05-30 (×3): qty 1

## 2015-05-30 MED ORDER — FENTANYL CITRATE (PF) 250 MCG/5ML IJ SOLN
INTRAMUSCULAR | Status: AC
  Filled 2015-05-30: qty 5

## 2015-05-30 MED ORDER — METOCLOPRAMIDE HCL 5 MG PO TABS
5.0000 mg | ORAL_TABLET | Freq: Three times a day (TID) | ORAL | Status: DC | PRN
  Administered 2015-05-31: 5 mg via ORAL
  Filled 2015-05-30: qty 1

## 2015-05-30 SURGICAL SUPPLY — 54 items
ALCOHOL ISOPROPYL (RUBBING) (MISCELLANEOUS) ×3 IMPLANT
BLADE SURG ROTATE 9660 (MISCELLANEOUS) ×3 IMPLANT
CAPT HIP TOTAL 2 ×3 IMPLANT
CHLORAPREP W/TINT 26ML (MISCELLANEOUS) ×3 IMPLANT
COVER SURGICAL LIGHT HANDLE (MISCELLANEOUS) ×3 IMPLANT
DERMABOND ADVANCED (GAUZE/BANDAGES/DRESSINGS) ×2
DERMABOND ADVANCED .7 DNX12 (GAUZE/BANDAGES/DRESSINGS) ×1 IMPLANT
DRAPE C-ARM 42X72 X-RAY (DRAPES) ×3 IMPLANT
DRAPE IMP U-DRAPE 54X76 (DRAPES) ×6 IMPLANT
DRAPE STERI IOBAN 125X83 (DRAPES) ×3 IMPLANT
DRAPE U-SHAPE 47X51 STRL (DRAPES) ×9 IMPLANT
DRSG AQUACEL AG ADV 3.5X10 (GAUZE/BANDAGES/DRESSINGS) ×3 IMPLANT
DRSG AQUACEL AG ADV 3.5X14 (GAUZE/BANDAGES/DRESSINGS) IMPLANT
ELECT BLADE 4.0 EZ CLEAN MEGAD (MISCELLANEOUS) ×3
ELECT REM PT RETURN 9FT ADLT (ELECTROSURGICAL) ×3
ELECTRODE BLDE 4.0 EZ CLN MEGD (MISCELLANEOUS) ×1 IMPLANT
ELECTRODE REM PT RTRN 9FT ADLT (ELECTROSURGICAL) ×1 IMPLANT
GLOVE BIOGEL PI IND STRL 8.5 (GLOVE) ×2 IMPLANT
GLOVE BIOGEL PI INDICATOR 8.5 (GLOVE) ×4
GLOVE BIOGEL PI ORTHO PRO 7.5 (GLOVE) ×6
GLOVE PI ORTHO PRO STRL 7.5 (GLOVE) ×3 IMPLANT
GLOVE SURG SS PI 8.0 STRL IVOR (GLOVE) ×3 IMPLANT
GOWN STRL REUS W/ TWL LRG LVL3 (GOWN DISPOSABLE) ×2 IMPLANT
GOWN STRL REUS W/TWL 2XL LVL3 (GOWN DISPOSABLE) ×3 IMPLANT
GOWN STRL REUS W/TWL LRG LVL3 (GOWN DISPOSABLE) ×4
HANDPIECE INTERPULSE COAX TIP (DISPOSABLE) ×2
HOOD PEEL AWAY FACE SHEILD DIS (HOOD) ×9 IMPLANT
KIT BASIN OR (CUSTOM PROCEDURE TRAY) ×3 IMPLANT
KIT ROOM TURNOVER OR (KITS) ×3 IMPLANT
MANIFOLD NEPTUNE II (INSTRUMENTS) ×3 IMPLANT
MARKER SKIN DUAL TIP RULER LAB (MISCELLANEOUS) ×6 IMPLANT
NEEDLE 18GX1X1/2 (RX/OR ONLY) (NEEDLE) ×3 IMPLANT
NEEDLE SPNL 18GX3.5 QUINCKE PK (NEEDLE) ×3 IMPLANT
NS IRRIG 1000ML POUR BTL (IV SOLUTION) ×3 IMPLANT
PACK TOTAL JOINT (CUSTOM PROCEDURE TRAY) ×3 IMPLANT
PACK UNIVERSAL I (CUSTOM PROCEDURE TRAY) ×3 IMPLANT
PAD ARMBOARD 7.5X6 YLW CONV (MISCELLANEOUS) ×6 IMPLANT
SAW OSC TIP CART 19.5X105X1.3 (SAW) ×3 IMPLANT
SEALER BIPOLAR AQUA 6.0 (INSTRUMENTS) IMPLANT
SET HNDPC FAN SPRY TIP SCT (DISPOSABLE) ×1 IMPLANT
SOLUTION BETADINE 4OZ (MISCELLANEOUS) ×3 IMPLANT
SUCTION FRAZIER HANDLE 10FR (MISCELLANEOUS) ×2
SUCTION TUBE FRAZIER 10FR DISP (MISCELLANEOUS) ×1 IMPLANT
SUT ETHIBOND NAB CT1 #1 30IN (SUTURE) ×6 IMPLANT
SUT MNCRL AB 3-0 PS2 18 (SUTURE) ×3 IMPLANT
SUT MON AB 2-0 CT1 36 (SUTURE) ×3 IMPLANT
SUT VIC AB 1 CT1 27 (SUTURE) ×2
SUT VIC AB 1 CT1 27XBRD ANBCTR (SUTURE) ×1 IMPLANT
SUT VIC AB 2-0 CT1 27 (SUTURE) ×2
SUT VIC AB 2-0 CT1 TAPERPNT 27 (SUTURE) ×1 IMPLANT
SUT VLOC 180 0 24IN GS25 (SUTURE) ×3 IMPLANT
SYR 50ML LL SCALE MARK (SYRINGE) ×3 IMPLANT
TOWEL OR 17X24 6PK STRL BLUE (TOWEL DISPOSABLE) ×3 IMPLANT
TOWEL OR 17X26 10 PK STRL BLUE (TOWEL DISPOSABLE) ×3 IMPLANT

## 2015-05-30 NOTE — ED Notes (Signed)
Pt arrives ems-Had fall at home this morning after tripping over sons shoes and landed onto right hip. Pt c/o of right hip and right knee pain. pts right leg is shorten and externally rotated. Pt received 6mg  of morphine and 4mg  of Zofran in route. Pt reports pain 2/10 at this time.

## 2015-05-30 NOTE — Anesthesia Postprocedure Evaluation (Signed)
Anesthesia Post Note  Patient: [name]  Procedure(s) Performed: Procedure(s) (LRB): TOTAL HIP ARTHROPLASTY ANTERIOR APPROACH (Right)  Anesthesia Type: General Level of consciousness: awake, oriented and patient cooperative Pain management: pain level controlled Vital Signs Assessment: post-procedure vital signs reviewed and stable Respiratory status: respiratory function stable and spontaneous breathing Cardiovascular status: stable Anesthetic complications: no    Last Vitals:  Filed Vitals:   05/30/15 1804 05/30/15 2152  BP: 164/84 100/66  Pulse: 90 98  Temp: 36.5 C 36.4 C  Resp: 18 24    Last Pain:  Filed Vitals:   05/30/15 2155  PainSc: 3                  Anhelica Fowers EDWARD

## 2015-05-30 NOTE — Anesthesia Preprocedure Evaluation (Signed)
Anesthesia Evaluation  Patient identified by MRN, date of birth, ID band  Reviewed: Allergy & Precautions, NPO status , Patient's Chart, lab work & pertinent test results  History of Anesthesia Complications (+) PONV  Airway Mallampati: I  TM Distance: >3 FB Neck ROM: Full    Dental   Pulmonary shortness of breath,    Pulmonary exam normal        Cardiovascular hypertension, + Peripheral Vascular Disease  Normal cardiovascular exam+ Valvular Problems/Murmurs      Neuro/Psych  Headaches, Depression  Neuromuscular disease    GI/Hepatic hiatal hernia, GERD  ,  Endo/Other  diabetes, Well ControlledHypothyroidism   Renal/GU      Musculoskeletal  (+) Arthritis , Fibromyalgia -  Abdominal   Peds  Hematology   Anesthesia Other Findings   Reproductive/Obstetrics                             Anesthesia Physical Anesthesia Plan  ASA: II and emergent  Anesthesia Plan: General   Post-op Pain Management:    Induction: Intravenous  Airway Management Planned: Oral ETT  Additional Equipment:   Intra-op Plan:   Post-operative Plan: Extubation in OR  Informed Consent: I have reviewed the patients History and Physical, chart, labs and discussed the procedure including the risks, benefits and alternatives for the proposed anesthesia with the patient or authorized representative who has indicated his/her understanding and acceptance.     Plan Discussed with: CRNA, Anesthesiologist and Surgeon  Anesthesia Plan Comments:         Anesthesia Quick Evaluation

## 2015-05-30 NOTE — H&P (Signed)
History and Physical    KAETLIN [name] [id number] DOB: [birth year] DOA: 05/30/2015   PCP: Lanette Hampshire, MD   Patient coming from/Resides with: Private residence with spouse  Chief Complaint: Fall, femur fracture  HPI: [name] is a 82 y.o. WF PMHx diet-controlled diabetes, hypertension, hypothyroidism, GERD, colon cancer stage III status post resection and chemotherapy in 2003, and dyslipidemia. Patient presented to the hospital after sustaining a mechanical fall at home. She tripped over her husband's bedroom shoes. She landed on her right side was having pain in her right hip and knee.Marland Kitchen She was noted to have right leg shortening and external rotation. EMS was called to the home and she was transported to the hospital for further evaluation and treatment. After arrival to the ER patient was taken for several x-rays. She reports that while popping transferred from stretcher to x-ray table she was "dropped" on one of the tables and had some significant chest pressure in mid chest that continued into her right chest that was reproducible with palpation. Patient has not been having any chest pain, dyspnea on exertion, orthopnea or paroxysmal nocturnal dyspnea prior to today's chest pain symptoms.  ED Course:  Afebrile-BP 161/93-pulse 79-respirations 16-RA saturations 96% 2 View Pelvis x-ray: Active, angulated right mid cervical femur fracture PCXR: No acute disease Two-view femur x-ray: Mid cervical right femur fracture impacted ferrous angulation deformity Lab data: Sodium 136, potassium 4.5, BUN 16, creatinine 0.7, glucose 110, troponin 0.00, daily BCs 10,300 with neutrophils 78% and absolute neutrophils 8%, Hgb 13.5, MCV 103, platelets 238,000 Nitroglycerin 0.4 mg SL 1 Sublimaze 50 g IV 1  Review of Systems:  In addition to the HPI above,  No Fever-chills, myalgias or other constitutional symptoms No Headache, changes with Vision or hearing, new weakness, tingling,  numbness in any extremity, No problems swallowing food or Liquids, indigestion/reflux No Chest pain prior to admission,no Cough or Shortness of Breath, palpitations, orthopnea or DOE No Abdominal pain, N/V; no melena or hematochezia, no dark tarry stools No dysuria, hematuria or flank pain No new skin rashes, lesions, masses or bruises, No recent weight gain or loss No polyuria, polydypsia or polyphagia,   Past Medical History  Diagnosis Date  . Hypertension   . Hypothyroid   . Goiter   . GERD (gastroesophageal reflux disease)   . Fibromyalgia   . Heart murmur   . Peripheral vascular disease ([place])     varicose veins in left arm   . Shortness of breath     with exertion   . Anemia     hx of in 2003   . Hiatal hernia   . Headache(784.0)     tension headache daily per pt   . Arthritis   . Depression   . DJD (degenerative joint disease)   . Osteoarthritis   . Colon cancer [facility])     s/p resecton of colon in 2003 , hx of chemo  . Headaches, cluster   . Abnormal uterine bleeding (AUB)     on cont HRT  . Colon cancer ([place])   . TMJ (dislocation of temporomandibular joint)   . Compression fracture jan 2012    due to fall  . PONV (postoperative nausea and vomiting)   . Choledocholithiasis with obstruction 11/16/2012  . Pancreatitis, acute 11/15/2012  . Calcification of aorta ([facility]) 11/16/2012  . Macular degeneration   . DM II (diabetes mellitus, type II), controlled 11/16/2012  . Blood transfusion without reported diagnosis     Past Surgical  History  Procedure Laterality Date  . Tmj arthroplasty  1990's  . Appendectomy    . Colon resection  2003  . Eye surgery      bilateral cataract surgery   . Total knee arthroplasty  12/20/2010    Procedure: TOTAL KNEE ARTHROPLASTY;  Surgeon: Mauri Pole;  Location: [facility];  Service: Orthopedics;  Laterality: Right;  . Colonoscopy w/ biopsies  09/25/2002, 09/18/2001  . Upper gastrointestinal endoscopy  09/18/2001  . Porta catheter  placement  10/30/2001  . Porta catheter removal  06/27/2003  . Tmj arthroplasty    . Dilation and curettage of uterus    . Hysteroscopy    . Esophagogastroduodenoscopy endoscopy    . Vertebroplasty  2012  . Back surgery  2000    lower  . Tonsillectomy  age 15 or 83  . Joint replacement Right dec 2012    knee  . Ercp N/A 11/14/2012    Procedure: ENDOSCOPIC RETROGRADE CHOLANGIOPANCREATOGRAPHY (ERCP);  Surgeon: Jeryl Columbia, MD;  Location: [facility];  Service: Endoscopy;  Laterality: N/A;  . Cholecystectomy N/A 11/17/2012    Procedure: LAPAROSCOPIC CHOLECYSTECTOMY;  Surgeon: Leighton Ruff, MD;  Location: [facility];  Service: General;  Laterality: N/A;     reports that she has never smoked. She has never used smokeless tobacco. She reports that she does not drink alcohol or use illicit drugs.  Mobility: Without assistive devices Work history: Retired in 2003 after cancer diagnosis   Allergies  Allergen Reactions  . Tape Rash    Blisters, nausea, and vomitting  . Latex Other (See Comments)    blister  . Lidocaine Nausea And Vomiting  . Sulfur Nausea And Vomiting    Family History  Problem Relation Age of Onset  . Heart disease Mother     Enlarged heart  . Arrhythmia Mother   . Diabetes Mother   . Stroke Sister   . Heart disease Brother     MI at unknown age  . Anesthesia problems Brother   . Heart attack      MI at age 5  . Heart disease Brother     CABG  . Heart disease Father   . Cancer Son     non-hodgkins lymphoma     Prior to Admission medications   Medication Sig Start Date End Date Taking? Authorizing Provider  acetaminophen (TYLENOL) 500 MG tablet Take 1,000 mg by mouth 2 (two) times daily.    Yes Historical Provider, MD  amoxicillin (AMOXIL) 500 MG capsule Take 2,000 mg by mouth once.   Yes Historical Provider, MD  aspirin 325 MG tablet Take 325 mg by mouth at bedtime.   Yes Historical Provider, MD  Calcium-Vitamin D (CALTRATE 600 PLUS-VIT D PO) Take 1  tablet by mouth 2 (two) times daily.    Yes Historical Provider, MD  docusate sodium (COLACE) 100 MG capsule Take 100 mg by mouth 3 (three) times daily as needed for constipation.   Yes Historical Provider, MD  lactobacillus acidophilus (BACID) TABS tablet Take 1 tablet by mouth 2 (two) times daily.   Yes Historical Provider, MD  levothyroxine (SYNTHROID, LEVOTHROID) 200 MCG tablet Take 200 mcg by mouth daily before breakfast.   Yes Historical Provider, MD  Multiple Vitamins-Minerals (CENTRUM SILVER ULTRA WOMENS PO) Take by mouth.   Yes Historical Provider, MD  Multiple Vitamins-Minerals (PRESERVISION AREDS PO) Take 2 capsules by mouth daily.   Yes Historical Provider, MD  nebivolol (BYSTOLIC) 10 MG tablet Take 10 mg by mouth  daily.   Yes Historical Provider, MD  nortriptyline (PAMELOR) 25 MG capsule Take 75 mg by mouth every morning.    Yes Historical Provider, MD  omeprazole (PRILOSEC) 20 MG capsule Take 20 mg by mouth 2 (two) times daily. 07/06/14  Yes Historical Provider, MD  simvastatin (ZOCOR) 20 MG tablet Take 20 mg by mouth at bedtime.    Yes Historical Provider, MD  vitamin B-12 (CYANOCOBALAMIN) 1000 MCG tablet Take 1,000 mcg by mouth daily.   Yes Historical Provider, MD  vitamin C (ASCORBIC ACID) 500 MG tablet Take 500 mg by mouth daily.    Yes Historical Provider, MD  Vitamin D, Ergocalciferol, (DRISDOL) 50000 UNITS CAPS capsule Take 1 capsule (50,000 Units total) by mouth every 14 (fourteen) days. 08/13/14  Yes Kem Boroughs, FNP  vitamin E 400 UNIT capsule Take 400 Units by mouth 2 (two) times daily.    Yes Historical Provider, MD  zolpidem (AMBIEN) 10 MG tablet Take 10 mg by mouth at bedtime. 06/15/11  Yes Historical Provider, MD    Physical Exam: Filed Vitals:   05/30/15 1434 05/30/15 1445 05/30/15 1547 05/30/15 1804  BP:  155/83 145/70 164/84  Pulse: 87 88 86 90  Temp:   97.3 F (36.3 C) 97.7 F (36.5 C)  TempSrc:   Axillary Oral  Resp: 21 17 18 18   Height:      Weight:       SpO2: 100% 96% 99% 100%      Constitutional: NAD, calm, Somewhat uncomfortable setting of ongoing right femur fracture pain Eyes: PERRL, lids and conjunctivae normal ENMT: Mucous membranes are moist. Posterior pharynx clear of any exudate or lesions.Normal dentition.  Neck: normal, supple, no masses, no thyromegaly Respiratory: clear to auscultation bilaterally, no wheezing, no crackles. Normal respiratory effort. No accessory muscle use.  Cardiovascular: Regular rate and rhythm, no murmurs / rubs / gallops. No extremity edema. 2+ pedal pulses. No carotid bruits.  Abdomen: no tenderness, no masses palpated. No hepatosplenomegaly. Bowel sounds positive.  Musculoskeletal: no clubbing / cyanosis. No joint deformity upper extremities. Patient does have internal rotation and shortening of the right lower extremity with extensive swelling without contusion or ecchymosis over the right thigh region Good ROM, no contractures. Normal muscle tone.  Skin: no rashes, lesions, ulcers. No induration Neurologic: CN 2-12 grossly intact. Sensation intact, DTR normal. Strength 5/5 x all 4 extremities.  Psychiatric: Normal judgment and insight. Alert and oriented x 3. Normal mood.    Labs on Admission: I have personally reviewed following labs and imaging studies  CBC:  Recent Labs Lab 05/30/15 1258  WBC 10.3  NEUTROABS 8.0*  HGB 13.5  HCT 42.4  MCV 102.7*  PLT 99991111   Basic Metabolic Panel:  Recent Labs Lab 05/30/15 1258 05/30/15 1555  NA 136  --   K 4.5  --   CL 103  --   CO2 24  --   GLUCOSE 110*  --   BUN 16  --   CREATININE 0.87  --   CALCIUM 9.2 9.0   GFR: Estimated Creatinine Clearance: 48.3 mL/min (by C-G formula based on Cr of 0.87). Liver Function Tests:  Recent Labs Lab 05/30/15 1258 05/30/15 1555  AST 33  --   ALT 25  --   ALKPHOS 58  --   BILITOT 0.5  --   PROT 6.7  --   ALBUMIN 3.5 3.7   No results for input(s): LIPASE, AMYLASE in the last 168 hours. No  results for input(s): AMMONIA  in the last 168 hours. Coagulation Profile: No results for input(s): INR, PROTIME in the last 168 hours. Cardiac Enzymes: No results for input(s): CKTOTAL, CKMB, CKMBINDEX, TROPONINI in the last 168 hours. BNP (last 3 results) No results for input(s): PROBNP in the last 8760 hours. HbA1C: No results for input(s): HGBA1C in the last 72 hours. CBG:  Recent Labs Lab 05/30/15 1808  GLUCAP 133*   Lipid Profile: No results for input(s): CHOL, HDL, LDLCALC, TRIG, CHOLHDL, LDLDIRECT in the last 72 hours. Thyroid Function Tests: No results for input(s): TSH, T4TOTAL, FREET4, T3FREE, THYROIDAB in the last 72 hours. Anemia Panel: No results for input(s): VITAMINB12, FOLATE, FERRITIN, TIBC, IRON, RETICCTPCT in the last 72 hours. Urine analysis:    Component Value Date/Time   COLORURINE YELLOW 05/30/2015 1751   APPEARANCEUR CLEAR 05/30/2015 1751   LABSPEC 1.015 05/30/2015 1751   PHURINE 7.5 05/30/2015 1751   GLUCOSEU NEGATIVE 05/30/2015 1751   HGBUR NEGATIVE 05/30/2015 1751   BILIRUBINUR NEGATIVE 05/30/2015 1751   BILIRUBINUR neg 07/29/2013 0952   KETONESUR 15* 05/30/2015 1751   PROTEINUR NEGATIVE 05/30/2015 1751   PROTEINUR neg 07/29/2013 0952   UROBILINOGEN negative 07/29/2013 0952   UROBILINOGEN 0.2 12/24/2010 0729   NITRITE NEGATIVE 05/30/2015 1751   NITRITE neg 07/29/2013 0952   LEUKOCYTESUR SMALL* 05/30/2015 1751   Sepsis Labs: @LABRCNTIP (procalcitonin:4,lacticidven:4) )No results found for this or any previous visit (from the past 240 hour(s)).   Radiological Exams on Admission: Dg Chest 1 View  05/30/2015  CLINICAL DATA:  Per patient she fell over her sons shoes today. Patient is having severe right hip pain that radiates to her right knee. No chest complaints at this time. EXAM: CHEST  1 VIEW COMPARISON:  07/02/2013 FINDINGS: Lungs are clear. Heart size and mediastinal contours are within normal limits. Atheromatous aorta. No effusion.  No  pneumothorax . A previous lower thoracic vertebral augmentation. Cholecystectomy clips. IMPRESSION: No acute cardiopulmonary disease. Electronically Signed   By: Lucrezia Europe M.D.   On: 05/30/2015 13:52   Dg Pelvis 1-2 Views  05/30/2015  CLINICAL DATA:  Per patient she fell over her sons shoes today. Patient is having severe right hip pain that radiates to her right knee. No chest complaints at this time. EXAM: PELVIS - 1-2 VIEW COMPARISON:  CT 11/15/2012 FINDINGS: Mid cervical fracture of the right femur, impacted with mild varus angulation deformity. Bony pelvis intact. IMPRESSION: 1. Impacted angulated right midcervical femur fracture. Electronically Signed   By: Lucrezia Europe M.D.   On: 05/30/2015 13:51   Dg Femur, Min 2 Views Right  05/30/2015  CLINICAL DATA:  Per patient she fell over her sons shoes today. Patient is having severe right hip pain that radiates to her right knee. No chest complaints at this time. EXAM: RIGHT FEMUR 2 VIEWS COMPARISON:  03/17/2015 FINDINGS: Transverse midcervical right femur fracture, impacted with varus angulation deformity. No dislocation. Distal femur intact. Components of knee arthroplasty project in expected location. IMPRESSION: 1. Midcervical right femur fracture. Electronically Signed   By: Lucrezia Europe M.D.   On: 05/30/2015 13:53    EKG: (Independently reviewed) sinus rhythm with ventricular rate 81 bpm, QTC 432 ms, nonspecific repolarization abnormality/ST segment changes in inferolateral leads each are similar to an essentially unchanged from previous EKG in 2014.  Assessment/Plan Principal Problem:   Closed right femoral fracture /  Fall at home -Patient presents with mechanical fall with subsequent traumatic right hip fracture -Dr. Delfino Lovett  to see patient in consultation and likely will  proceed with surgical intervention once medically cleared -Hip/femur fracture order set initiated therefore please see signed orders for details -Low-dose morphine initially  noting patient developed nausea after administration of pain medications; she has not had further chest pain according to the RN but as precaution with nausea and vomiting will check EKG  Active Problems:   Chest pain syndrome -Has not been having any typical chest pain symptoms prior to arrival and only began having chest pain after fall after being repositioned in the radiology department; chest pain also reproducible with palpation over anterior chest wall -As precaution cycle troponin and check echocardiogram -Initial EKG and troponin negative and reassuring and patient currently without complaints of chest pain, she is moved or her chest wall is palpated    Essential hypertension -Current blood pressure modestly controlled but this in setting of ongoing significant traumatic pain -Continue preadmission Bystolic    Hypothyroidism -Continue Synthroid    Diabetes mellitus type 2, diet-controlled  -Hemoglobin A1c -Follow CBGs/provide SSI    Hyperlipidemia -Continue Zocor    GERD (gastroesophageal reflux disease) -Continue PPI    History of colon cancer, stage III -Has been stable and has follow-up appointment scheduled with outpatient oncologist       DVT prophylaxis: SCDs and suspect will proceed with surgical intervention later today  Code Status: Full code  Family Communication: Husband at bedside Disposition Plan: Anticipate discharge to previous home and environment pending PT/OT evaluation in the postoperative setting Consults called: Orthopedic surgery/[place] Admission status: Inpatient/telemetry    Allie Bossier ANP-BC [facility] Pager [phone]   If 7PM-7AM, please contact night-coverage [email or web address] Password [facility]  05/30/2015, 7:15 PM  Examined patient and discussed assessment and plan with ANP Erin Hearing and agree with above.. Care during the described time interval was provided by me .  I have reviewed this patient's available data,  including medical history, events of note, physical examination, and all test results as part of my evaluation. I have personally reviewed and interpreted all radiology studies.

## 2015-05-30 NOTE — Transfer of Care (Signed)
Immediate Anesthesia Transfer of Care Note  Patient: [name]  Procedure(s) Performed: Procedure(s): TOTAL HIP ARTHROPLASTY ANTERIOR APPROACH (Right)  Patient Location: PACU  Anesthesia Type:General  Level of Consciousness: awake and alert   Airway & Oxygen Therapy: Patient Spontanous Breathing and Patient connected to nasal cannula oxygen  Post-op Assessment: Report given to RN and Post -op Vital signs reviewed and stable  Post vital signs: Reviewed and stable  Last Vitals:  Filed Vitals:   05/30/15 1804 05/30/15 2152  BP: 164/84 100/66  Pulse: 90 98  Temp: 36.5 C   Resp: 18 24    Last Pain:  Filed Vitals:   05/30/15 2154  PainSc: 3          Complications: No apparent anesthesia complications

## 2015-05-30 NOTE — Anesthesia Procedure Notes (Signed)
Procedure Name: Intubation Date/Time: 05/30/2015 7:39 PM Performed by: Manuela Schwartz B Pre-anesthesia Checklist: Patient identified, Emergency Drugs available, Suction available, Patient being monitored and Timeout performed Patient Re-evaluated:Patient Re-evaluated prior to inductionOxygen Delivery Method: Circle system utilized Preoxygenation: Pre-oxygenation with 100% oxygen Intubation Type: IV induction Ventilation: Mask ventilation without difficulty Laryngoscope Size: Mac and 3 Grade View: Grade III Tube type: Oral Tube size: 7.5 mm Number of attempts: 1 Airway Equipment and Method: Bougie stylet Placement Confirmation: ETT inserted through vocal cords under direct vision,  positive ETCO2 and breath sounds checked- equal and bilateral Secured at: 21 cm Tube secured with: Tape Dental Injury: Teeth and Oropharynx as per pre-operative assessment

## 2015-05-30 NOTE — ED Provider Notes (Signed)
CSN: [id number]     Arrival date & time 05/30/15  1220 History   First MD Initiated Contact with Patient 05/30/15 1238     Chief Complaint  Patient presents with  . Fall  . Hip Pain     (Consider location/radiation/quality/duration/timing/severity/associated sxs/prior Treatment) HPI Patient states she turned and tripped over shoes lying in the floor. She landed on the floor on her right side. She complains of right shoulder right hip and right knee pain. She's been unable to walk since the fall. She denies any head or neck injury. No loss of consciousness. She's had a right knee replaced by Dr. Alvan Dame. Transported by EMS. Noted to have right lower extremity shortening. Given 6 mg of morphine en route with significant improvement of her pain. Denies being on any anticoagulants. Past Medical History  Diagnosis Date  . Hypertension   . Hypothyroid   . Goiter   . GERD (gastroesophageal reflux disease)   . Fibromyalgia   . Heart murmur   . Peripheral vascular disease ([place])     varicose veins in left arm   . Shortness of breath     with exertion   . Anemia     hx of in 2003   . Hiatal hernia   . Headache(784.0)     tension headache daily per pt   . Arthritis   . Depression   . DJD (degenerative joint disease)   . Osteoarthritis   . Colon cancer [facility])     s/p resecton of colon in 2003 , hx of chemo  . Headaches, cluster   . Abnormal uterine bleeding (AUB)     on cont HRT  . Colon cancer ([place])   . TMJ (dislocation of temporomandibular joint)   . Compression fracture jan 2012    due to fall  . PONV (postoperative nausea and vomiting)   . Choledocholithiasis with obstruction 11/16/2012  . Pancreatitis, acute 11/15/2012  . Calcification of aorta (HCC) 11/16/2012  . Macular degeneration   . DM II (diabetes mellitus, type II), controlled 11/16/2012  . Blood transfusion without reported diagnosis    Past Surgical History  Procedure Laterality Date  . Tmj arthroplasty  1990's  .  Appendectomy    . Colon resection  2003  . Eye surgery      bilateral cataract surgery   . Total knee arthroplasty  12/20/2010    Procedure: TOTAL KNEE ARTHROPLASTY;  Surgeon: Mauri Pole;  Location: [facility];  Service: Orthopedics;  Laterality: Right;  . Colonoscopy w/ biopsies  09/25/2002, 09/18/2001  . Upper gastrointestinal endoscopy  09/18/2001  . Porta catheter placement  10/30/2001  . Porta catheter removal  06/27/2003  . Tmj arthroplasty    . Dilation and curettage of uterus    . Hysteroscopy    . Esophagogastroduodenoscopy endoscopy    . Vertebroplasty  2012  . Back surgery  2000    lower  . Tonsillectomy  age 11 or 74  . Joint replacement Right dec 2012    knee  . Ercp N/A 11/14/2012    Procedure: ENDOSCOPIC RETROGRADE CHOLANGIOPANCREATOGRAPHY (ERCP);  Surgeon: Jeryl Columbia, MD;  Location: [facility];  Service: Endoscopy;  Laterality: N/A;  . Cholecystectomy N/A 11/17/2012    Procedure: LAPAROSCOPIC CHOLECYSTECTOMY;  Surgeon: Leighton Ruff, MD;  Location: [facility];  Service: General;  Laterality: N/A;   Family History  Problem Relation Age of Onset  . Heart disease Mother     Enlarged heart  . Arrhythmia Mother   .  Diabetes Mother   . Stroke Sister   . Heart disease Brother     MI at unknown age  . Anesthesia problems Brother   . Heart attack      MI at age 76  . Heart disease Brother     CABG  . Heart disease Father   . Cancer Son     non-hodgkins lymphoma   Social History  Substance Use Topics  . Smoking status: Never Smoker   . Smokeless tobacco: Never Used  . Alcohol Use: No   OB History    Gravida Para Term Preterm AB TAB SAB Ectopic Multiple Living   1 1        1      Review of Systems  Constitutional: Negative for fever and chills.  Respiratory: Negative for shortness of breath.   Cardiovascular: Negative for chest pain.  Gastrointestinal: Negative for nausea, vomiting and abdominal pain.  Musculoskeletal: Positive for arthralgias. Negative for neck  pain.  Skin: Negative for wound.  Neurological: Negative for dizziness, syncope, weakness, light-headedness, numbness and headaches.  All other systems reviewed and are negative.     Allergies  Tape; Latex; Lidocaine; and Sulfur  Home Medications   Prior to Admission medications   Medication Sig Start Date End Date Taking? Authorizing Provider  acetaminophen (TYLENOL) 500 MG tablet Take 1,000 mg by mouth 2 (two) times daily.    Yes Historical Provider, MD  amoxicillin (AMOXIL) 500 MG capsule Take 2,000 mg by mouth once.   Yes Historical Provider, MD  aspirin 325 MG tablet Take 325 mg by mouth at bedtime.   Yes Historical Provider, MD  Calcium-Vitamin D (CALTRATE 600 PLUS-VIT D PO) Take 1 tablet by mouth 2 (two) times daily.    Yes Historical Provider, MD  docusate sodium (COLACE) 100 MG capsule Take 100 mg by mouth 3 (three) times daily as needed for constipation.   Yes Historical Provider, MD  lactobacillus acidophilus (BACID) TABS tablet Take 1 tablet by mouth 2 (two) times daily.   Yes Historical Provider, MD  levothyroxine (SYNTHROID, LEVOTHROID) 200 MCG tablet Take 200 mcg by mouth daily before breakfast.   Yes Historical Provider, MD  Multiple Vitamins-Minerals (CENTRUM SILVER ULTRA WOMENS PO) Take by mouth.   Yes Historical Provider, MD  Multiple Vitamins-Minerals (PRESERVISION AREDS PO) Take 2 capsules by mouth daily.   Yes Historical Provider, MD  nebivolol (BYSTOLIC) 10 MG tablet Take 10 mg by mouth daily.   Yes Historical Provider, MD  nortriptyline (PAMELOR) 25 MG capsule Take 75 mg by mouth every morning.    Yes Historical Provider, MD  omeprazole (PRILOSEC) 20 MG capsule Take 20 mg by mouth 2 (two) times daily. 07/06/14  Yes Historical Provider, MD  simvastatin (ZOCOR) 20 MG tablet Take 20 mg by mouth at bedtime.    Yes Historical Provider, MD  vitamin B-12 (CYANOCOBALAMIN) 1000 MCG tablet Take 1,000 mcg by mouth daily.   Yes Historical Provider, MD  vitamin C (ASCORBIC  ACID) 500 MG tablet Take 500 mg by mouth daily.    Yes Historical Provider, MD  Vitamin D, Ergocalciferol, (DRISDOL) 50000 UNITS CAPS capsule Take 1 capsule (50,000 Units total) by mouth every 14 (fourteen) days. 08/13/14  Yes Kem Boroughs, FNP  vitamin E 400 UNIT capsule Take 400 Units by mouth 2 (two) times daily.    Yes Historical Provider, MD  zolpidem (AMBIEN) 10 MG tablet Take 10 mg by mouth at bedtime. 06/15/11  Yes Historical Provider, MD   BP 119/62  mmHg  Pulse 91  Temp(Src) 98.9 F (37.2 C) (Oral)  Resp 16  Ht 5\' 6"  (1.676 m)  Wt 134 lb (60.782 kg)  BMI 21.64 kg/m2  SpO2 93%  LMP 01/17/1994 Physical Exam  Constitutional: She is oriented to person, place, and time. She appears well-developed and well-nourished. No distress.  HENT:  Head: Normocephalic and atraumatic.  Mouth/Throat: Oropharynx is clear and moist.  Eyes: EOM are normal. Pupils are equal, round, and reactive to light.  Neck: Normal range of motion. Neck supple.  No posterior midline cervical tenderness to palpation.  Cardiovascular: Normal rate and regular rhythm.   Pulmonary/Chest: Effort normal and breath sounds normal. No respiratory distress. She has no wheezes. She has no rales. She exhibits no tenderness.  Abdominal: Soft. Bowel sounds are normal. She exhibits no distension and no mass. There is no tenderness. There is no rebound and no guarding.  Musculoskeletal: She exhibits tenderness. She exhibits no edema.  Patient unable to range the right hip due to pain. Tender to palpation in the inguinal fold. There is obvious shortening of the right lower extremity compared to the impaired to the left. Difficult to assess right near the knee due to pain. No tenderness to palpation or obvious swelling or deformity. Posterior tibial and dorsalis pedis pulses are 2+ bilaterally. Patient does have some mild tenderness to palpation over the lateral surface of the deltoid. She has full range of motion without pain. There  is no deformity.  Neurological: She is alert and oriented to person, place, and time.  Moves all extremities without deficit. Difficult to assess the right lower extremity fully due to pain with movement of the hip. Sensation is intact.  Skin: Skin is warm and dry. No rash noted. No erythema.  Psychiatric: She has a normal mood and affect. Her behavior is normal.  Nursing note and vitals reviewed.   ED Course  Procedures (including critical care time) Labs Review Labs Reviewed  CBC WITH DIFFERENTIAL/PLATELET - Abnormal; Notable for the following:    MCV 102.7 (*)    Neutro Abs 8.0 (*)    All other components within normal limits  COMPREHENSIVE METABOLIC PANEL - Abnormal; Notable for the following:    Glucose, Bld 110 (*)    All other components within normal limits  URINALYSIS, ROUTINE W REFLEX MICROSCOPIC (NOT AT [facility]) - Abnormal; Notable for the following:    Ketones, ur 15 (*)    Leukocytes, UA SMALL (*)    All other components within normal limits  URINE MICROSCOPIC-ADD ON - Abnormal; Notable for the following:    Squamous Epithelial / LPF 0-5 (*)    Bacteria, UA RARE (*)    Casts HYALINE CASTS (*)    All other components within normal limits  GLUCOSE, CAPILLARY - Abnormal; Notable for the following:    Glucose-Capillary 133 (*)    All other components within normal limits  GLUCOSE, CAPILLARY - Abnormal; Notable for the following:    Glucose-Capillary 148 (*)    All other components within normal limits  BASIC METABOLIC PANEL - Abnormal; Notable for the following:    Glucose, Bld 182 (*)    Calcium 8.7 (*)    All other components within normal limits  CBC - Abnormal; Notable for the following:    WBC 12.4 (*)    RBC 3.24 (*)    Hemoglobin 10.5 (*)    HCT 33.0 (*)    MCV 101.9 (*)    All other components within normal limits  BASIC METABOLIC PANEL - Abnormal; Notable for the following:    Glucose, Bld 135 (*)    Calcium 8.5 (*)    GFR calc non Af Amer 57 (*)    All  other components within normal limits  GLUCOSE, CAPILLARY - Abnormal; Notable for the following:    Glucose-Capillary 104 (*)    All other components within normal limits  SURGICAL PCR SCREEN  TROPONIN I  TROPONIN I  CALCIUM  ALBUMIN  TROPONIN I  VITAMIN D 25 HYDROXY (VIT D DEFICIENCY, FRACTURES)  CBC  CBC  BASIC METABOLIC PANEL  I-STAT TROPOININ, ED  TYPE AND SCREEN  ABO/RH    Imaging Review Dg Chest 1 View  05/30/2015  CLINICAL DATA:  Per patient she fell over her sons shoes today. Patient is having severe right hip pain that radiates to her right knee. No chest complaints at this time. EXAM: CHEST  1 VIEW COMPARISON:  07/02/2013 FINDINGS: Lungs are clear. Heart size and mediastinal contours are within normal limits. Atheromatous aorta. No effusion.  No pneumothorax . A previous lower thoracic vertebral augmentation. Cholecystectomy clips. IMPRESSION: No acute cardiopulmonary disease. Electronically Signed   By: Lucrezia Europe M.D.   On: 05/30/2015 13:52   Dg Pelvis 1-2 Views  05/30/2015  CLINICAL DATA:  Per patient she fell over her sons shoes today. Patient is having severe right hip pain that radiates to her right knee. No chest complaints at this time. EXAM: PELVIS - 1-2 VIEW COMPARISON:  CT 11/15/2012 FINDINGS: Mid cervical fracture of the right femur, impacted with mild varus angulation deformity. Bony pelvis intact. IMPRESSION: 1. Impacted angulated right midcervical femur fracture. Electronically Signed   By: Lucrezia Europe M.D.   On: 05/30/2015 13:51   Pelvis Portable  05/30/2015  CLINICAL DATA:  82 year old female status post right hip arthroplasty. EXAM: PORTABLE PELVIS 1-2 VIEWS COMPARISON:  Radiograph dated 05/30/2015 FINDINGS: There has been interval placement of a right hip arthroplasty. There is anatomic alignment of the acetabular cup and femoral component of the prosthesis. No fracture identified. Postsurgical changes with pockets of gas in the soft tissues of the right hip.  IMPRESSION: Right hip arthroplasty in anatomic alignment. Electronically Signed   By: Anner Crete M.D.   On: 05/30/2015 23:00   Dg Hip Operative Unilat With Pelvis Right  05/30/2015  CLINICAL DATA:  Right hip replacement. EXAM: OPERATIVE RIGHT HIP (WITH PELVIS IF PERFORMED) 1 VIEWS TECHNIQUE: Fluoroscopic spot image(s) were submitted for interpretation post-operatively. COMPARISON:  05/30/2015 FINDINGS: Patient has undergone right hip arthroplasty. Femoral head component is well seated in the acetabular component on the frontal view provided. No new fractures are identified. IMPRESSION: Status post total right hip arthroplasty. No adverse features identified. Electronically Signed   By: Nolon Nations M.D.   On: 05/30/2015 21:54   Dg Femur, Min 2 Views Right  05/30/2015  CLINICAL DATA:  Per patient she fell over her sons shoes today. Patient is having severe right hip pain that radiates to her right knee. No chest complaints at this time. EXAM: RIGHT FEMUR 2 VIEWS COMPARISON:  03/17/2015 FINDINGS: Transverse midcervical right femur fracture, impacted with varus angulation deformity. No dislocation. Distal femur intact. Components of knee arthroplasty project in expected location. IMPRESSION: 1. Midcervical right femur fracture. Electronically Signed   By: Lucrezia Europe M.D.   On: 05/30/2015 13:53   I have personally reviewed and evaluated these images and lab results as part of my medical decision-making.   EKG Interpretation   Date/Time:  Saturday May 30 2015 13:43:10 EDT Ventricular Rate:  81 PR Interval:  153 QRS Duration: 105 QT Interval:  372 QTC Calculation: 432 R Axis:   22 Text Interpretation:  Sinus rhythm Borderline repolarization abnormality  Confirmed by Lita Mains  MD, Teasha Murrillo ([id number]) on 05/30/2015 1:52:32 PM      MDM   Final diagnoses:  Closed right hip fracture, initial encounter ([place])  Atypical chest pain     Patient presents again experiencing central chest pain with  no radiation after having x-rays performed. She denies any shortness of breath or pain with deep breathing. States the pain is improving. No previous MI or known CAD. Discussed with orthopedics. Will operate after cleared by hospitalist. Hospitalist to admit.  Julianne Rice, MD 05/31/15 [phone]

## 2015-05-30 NOTE — ED Notes (Signed)
Family at bedside. 

## 2015-05-30 NOTE — ED Notes (Signed)
Pt placed into gown and on to monitor upon arrival to room. Pt monitored by blood pressure and pulse ox. Pts family member at bedside.

## 2015-05-30 NOTE — Consult Note (Signed)
ORTHOPAEDIC CONSULTATION  REQUESTING PHYSICIAN: Allie Bossier, MD  PCP:  Lanette Hampshire, MD  Chief Complaint: right hip pain  HPI: [name] is a 82 y.o. female who complains of right hip pain after a tripping and falling earlier today. Unable to WB. Admitted to hospitalist. Ortho consult for right femoral neck fracture. H/o R TKA by Dr. Alvan Dame.  Past Medical History  Diagnosis Date  . Hypertension   . Hypothyroid   . Goiter   . GERD (gastroesophageal reflux disease)   . Fibromyalgia   . Heart murmur   . Peripheral vascular disease ([place])     varicose veins in left arm   . Shortness of breath     with exertion   . Anemia     hx of in 2003   . Hiatal hernia   . Headache(784.0)     tension headache daily per pt   . Arthritis   . Depression   . DJD (degenerative joint disease)   . Osteoarthritis   . Colon cancer [facility])     s/p resecton of colon in 2003 , hx of chemo  . Headaches, cluster   . Abnormal uterine bleeding (AUB)     on cont HRT  . Colon cancer ([place])   . TMJ (dislocation of temporomandibular joint)   . Compression fracture jan 2012    due to fall  . PONV (postoperative nausea and vomiting)   . Choledocholithiasis with obstruction 11/16/2012  . Pancreatitis, acute 11/15/2012  . Calcification of aorta (HCC) 11/16/2012  . Macular degeneration   . DM II (diabetes mellitus, type II), controlled 11/16/2012  . Blood transfusion without reported diagnosis    Past Surgical History  Procedure Laterality Date  . Tmj arthroplasty  1990's  . Appendectomy    . Colon resection  2003  . Eye surgery      bilateral cataract surgery   . Total knee arthroplasty  12/20/2010    Procedure: TOTAL KNEE ARTHROPLASTY;  Surgeon: Mauri Pole;  [facility];  Service: Orthopedics;  Laterality: Right;  . Colonoscopy w/ biopsies  09/25/2002, 09/18/2001  . Upper gastrointestinal endoscopy  09/18/2001  . Porta catheter placement  10/30/2001  . Porta catheter removal   06/27/2003  . Tmj arthroplasty    . Dilation and curettage of uterus    . Hysteroscopy    . Esophagogastroduodenoscopy endoscopy    . Vertebroplasty  2012  . Back surgery  2000    lower  . Tonsillectomy  age 62 or 35  . Joint replacement Right dec 2012    knee  . Ercp N/A 11/14/2012    Procedure: ENDOSCOPIC RETROGRADE CHOLANGIOPANCREATOGRAPHY (ERCP);  Surgeon: Jeryl Columbia, MD;  Location: [facility];  Service: Endoscopy;  Laterality: N/A;  . Cholecystectomy N/A 11/17/2012    Procedure: LAPAROSCOPIC CHOLECYSTECTOMY;  Surgeon: Leighton Ruff, MD;  [facility];  Service: General;  Laterality: N/A;   Social History   Social History  . Marital Status: Single    Spouse Name: N/A  . Number of Children: 1  . Years of Education: N/A   Occupational History  .      retired   Social History Main Topics  . Smoking status: Never Smoker   . Smokeless tobacco: Never Used  . Alcohol Use: No  . Drug Use: No  . Sexual Activity: No   Other Topics Concern  . None   Social History Narrative   Family History  Problem Relation Age of  Onset  . Heart disease Mother     Enlarged heart  . Arrhythmia Mother   . Diabetes Mother   . Stroke Sister   . Heart disease Brother     MI at unknown age  . Anesthesia problems Brother   . Heart attack      MI at age 20  . Heart disease Brother     CABG  . Heart disease Father   . Cancer Son     non-hodgkins lymphoma   Allergies  Allergen Reactions  . Tape Rash    Blisters, nausea, and vomitting  . Latex Other (See Comments)    blister  . Lidocaine Nausea And Vomiting  . Sulfur Nausea And Vomiting   Prior to Admission medications   Medication Sig Start Date End Date Taking? Authorizing Provider  acetaminophen (TYLENOL) 500 MG tablet Take 1,000 mg by mouth 2 (two) times daily.    Yes Historical Provider, MD  amoxicillin (AMOXIL) 500 MG capsule Take 2,000 mg by mouth once.   Yes Historical Provider, MD  aspirin 325 MG tablet Take 325 mg  by mouth at bedtime.   Yes Historical Provider, MD  Calcium-Vitamin D (CALTRATE 600 PLUS-VIT D PO) Take 1 tablet by mouth 2 (two) times daily.    Yes Historical Provider, MD  docusate sodium (COLACE) 100 MG capsule Take 100 mg by mouth 3 (three) times daily as needed for constipation.   Yes Historical Provider, MD  lactobacillus acidophilus (BACID) TABS tablet Take 1 tablet by mouth 2 (two) times daily.   Yes Historical Provider, MD  levothyroxine (SYNTHROID, LEVOTHROID) 200 MCG tablet Take 200 mcg by mouth daily before breakfast.   Yes Historical Provider, MD  Multiple Vitamins-Minerals (CENTRUM SILVER ULTRA WOMENS PO) Take by mouth.   Yes Historical Provider, MD  Multiple Vitamins-Minerals (PRESERVISION AREDS PO) Take 2 capsules by mouth daily.   Yes Historical Provider, MD  nebivolol (BYSTOLIC) 10 MG tablet Take 10 mg by mouth daily.   Yes Historical Provider, MD  nortriptyline (PAMELOR) 25 MG capsule Take 75 mg by mouth every morning.    Yes Historical Provider, MD  omeprazole (PRILOSEC) 20 MG capsule Take 20 mg by mouth 2 (two) times daily. 07/06/14  Yes Historical Provider, MD  simvastatin (ZOCOR) 20 MG tablet Take 20 mg by mouth at bedtime.    Yes Historical Provider, MD  vitamin B-12 (CYANOCOBALAMIN) 1000 MCG tablet Take 1,000 mcg by mouth daily.   Yes Historical Provider, MD  vitamin C (ASCORBIC ACID) 500 MG tablet Take 500 mg by mouth daily.    Yes Historical Provider, MD  Vitamin D, Ergocalciferol, (DRISDOL) 50000 UNITS CAPS capsule Take 1 capsule (50,000 Units total) by mouth every 14 (fourteen) days. 08/13/14  Yes Kem Boroughs, FNP  vitamin E 400 UNIT capsule Take 400 Units by mouth 2 (two) times daily.    Yes Historical Provider, MD  zolpidem (AMBIEN) 10 MG tablet Take 10 mg by mouth at bedtime. 06/15/11  Yes Historical Provider, MD   Dg Chest 1 View  05/30/2015  CLINICAL DATA:  Per patient she fell over her sons shoes today. Patient is having severe right hip pain that radiates to her  right knee. No chest complaints at this time. EXAM: CHEST  1 VIEW COMPARISON:  07/02/2013 FINDINGS: Lungs are clear. Heart size and mediastinal contours are within normal limits. Atheromatous aorta. No effusion.  No pneumothorax . A previous lower thoracic vertebral augmentation. Cholecystectomy clips. IMPRESSION: No acute cardiopulmonary disease. Electronically Signed  By: Lucrezia Europe M.D.   On: 05/30/2015 13:52   Dg Pelvis 1-2 Views  05/30/2015  CLINICAL DATA:  Per patient she fell over her sons shoes today. Patient is having severe right hip pain that radiates to her right knee. No chest complaints at this time. EXAM: PELVIS - 1-2 VIEW COMPARISON:  CT 11/15/2012 FINDINGS: Mid cervical fracture of the right femur, impacted with mild varus angulation deformity. Bony pelvis intact. IMPRESSION: 1. Impacted angulated right midcervical femur fracture. Electronically Signed   By: Lucrezia Europe M.D.   On: 05/30/2015 13:51   Dg Femur, Min 2 Views Right  05/30/2015  CLINICAL DATA:  Per patient she fell over her sons shoes today. Patient is having severe right hip pain that radiates to her right knee. No chest complaints at this time. EXAM: RIGHT FEMUR 2 VIEWS COMPARISON:  03/17/2015 FINDINGS: Transverse midcervical right femur fracture, impacted with varus angulation deformity. No dislocation. Distal femur intact. Components of knee arthroplasty project in expected location. IMPRESSION: 1. Midcervical right femur fracture. Electronically Signed   By: Lucrezia Europe M.D.   On: 05/30/2015 13:53    Positive ROS: All other systems have been reviewed and were otherwise negative with the exception of those mentioned in the HPI and as above.  Physical Exam: General: Alert, no acute distress Cardiovascular: No pedal edema Respiratory: No cyanosis, no use of accessory musculature GI: No organomegaly, abdomen is soft and non-tender Skin: No lesions in the area of chief complaint Neurologic: Sensation intact  distally Psychiatric: Patient is competent for consent with normal mood and affect Lymphatic: No axillary or cervical lymphadenopathy  MUSCULOSKELETAL: Skin intact. Shortened and externally rotated. Pain with logroll. (+) TA/GS/EHL. 2+ DP. SILT.  Assessment: Displaced right femoral neck fracture.  Plan: Recommend right hip hemi vs THA to restore mobility. NPO. Plan for surgery tonight.  The risks, benefits, and alternatives were discussed with the patient. There are risks associated with the surgery including, but not limited to, problems with anesthesia (death), infection, instability (giving out of the joint), dislocation, differences in leg length/angulation/rotation, fracture of bones, loosening or failure of implants, hematoma (blood accumulation) which may require surgical drainage, blood clots, pulmonary embolism, nerve injury (foot drop and lateral thigh numbness), and blood vessel injury. We discussed that the 1 year risk of mortality with this injury is roughly 30%. The patient understands these risks and elects to proceed.   Syenna Nazir, Horald Pollen, MD Cell [phone]    05/30/2015 5:31 PM

## 2015-05-30 NOTE — ED Notes (Signed)
Pt remains monitored by blood pressure, pulse ox, and 12 lead. Pt is noted to have family at bedside.

## 2015-05-30 NOTE — Op Note (Signed)
OPERATIVE REPORT  SURGEON: Rod Can, MD   ASSISTANT: Merla Riches, PA-C.  PREOPERATIVE DIAGNOSIS: Displaced Right femoral neck fracture.   POSTOPERATIVE DIAGNOSIS: Displaced Right femoral neck fracture.   PROCEDURE: Right total hip arthroplasty, anterior approach.   IMPLANTS: DePuy Tri Lock stem, size 5, std offset. DePuy Pinnacle Cup, size 50 mm. DePuy Altrx liner, size 50 by 32 mm, +4 neutral. DePuy Biolox ceramic head ball, size 32 + 1 mm. 6.5 mm cancellous screw x1,  ANESTHESIA:  General  ESTIMATED BLOOD LOSS: 450 mL.  ANTIBIOTICS: 2 g ancef.  DRAINS: None.  COMPLICATIONS: None.   CONDITION: PACU - hemodynamically stable.Marland Kitchen   BRIEF CLINICAL NOTE: [name] is a 82 y.o. female with a displaced Right femoral neck fracture. She was admitted by the hospitalist and underwent perioperative risk stratification and medical optimization. The risks, benefits, and alternatives to the procedure were explained, and the patient elected to proceed.  PROCEDURE IN DETAIL: Surgical site was marked by myself. Spinal anesthesia was obtained in the pre-op holding area. Once inside the operative room, a foley catheter was inserted. The patient was then positioned on the Hana table. All bony prominences were well padded. The hip was prepped and draped in the normal sterile surgical fashion. A time-out was called verifying side and site of surgery. The patient received IV antibiotics within 60 minutes of beginning the procedure.  The direct anterior approach to the hip was performed through the Hueter interval. Lateral femoral circumflex vessels were treated with the Auqumantys. The anterior capsule was exposed and an inverted T capsulotomy was made.Fracture hematoma was evacuated. A comminuted transcervical femoral neck fracture was identified. The femoral neck cut was made to the level of the templated cut. A corkscrew was placed into the head and the head was removed. The  head was passed to the back table and was measured.  Acetabular exposure was achieved, and the pulvinar and labrum were excised. Sequental reaming of the acetabulum was then performed up to a size 49 mm reamer. A 50 mm cup was then opened and impacted into place at approximately 40 degrees of abduction and 20 degrees of anteversion. I chose to augment the already excellent fixation with a single screw. The final polyethylene liner was impacted into place and inferior acetabular osteophytes were removed.   I then gained femoral exposure taking care to protect the abductors and greater trochanter. This was performed using standard external rotation, extension, and adduction. The capsule was peeled off the inner aspect of the greater trochanter, taking care to preserve the short external rotators. A cookie cutter was used to enter the femoral canal, and then the femoral canal finder was placed. Sequential broaching was performed up to a size 5. Calcar planer was used on the femoral neck remnant. I placed a std offset neck and a trial head ball. The hip was reduced. Leg lengths and offset were checked fluoroscopically. The hip was dislocated and trial components were removed. The final implants were placed, and the hip was reduced.  Fluoroscopy was used to confirm component position and leg lengths. At 90 degrees of external rotation and full extension, the hip was stable to an anterior directed force.  The wound was copiously irrigated with a dilute betadine solution followed by normal saline. Marcaine solution was injected into the periarticular soft tissue. The wound was closed in layers using #1 Vicryl and V-Loc for the fascia, 2-0 Vicryl for the subcutaneous fat, 2-0 Monocryl for the deep dermal layer, 3-0 running  Monocryl subcuticular stitch, and Dermabond for the skin. Once the glue was fully dried, an Aquacell Ag dressing was applied. The patient was transported to the recovery room in  stable condition. Sponge, needle, and instrument counts were correct at the end of the case x2. The patient tolerated the procedure well and there were no known complications.  Please note that a surgical assistant was a medical necessity for this procedure to perform it in a safe and expeditious manner. Assistant was necessary to provide appropriate retraction of vital neurovascular structures, to prevent femoral fracture, and to allow for anatomic placement of the prosthesis.

## 2015-05-31 ENCOUNTER — Inpatient Hospital Stay (HOSPITAL_COMMUNITY): Payer: Medicare Other

## 2015-05-31 DIAGNOSIS — R079 Chest pain, unspecified: Secondary | ICD-10-CM

## 2015-05-31 LAB — BASIC METABOLIC PANEL
ANION GAP: 8 (ref 5–15)
Anion gap: 11 (ref 5–15)
BUN: 13 mg/dL (ref 6–20)
BUN: 12 mg/dL (ref 6–20)
CALCIUM: 8.7 mg/dL — AB (ref 8.9–10.3)
CHLORIDE: 102 mmol/L (ref 101–111)
CO2: 26 mmol/L (ref 22–32)
CO2: 23 mmol/L (ref 22–32)
Calcium: 8.5 mg/dL — ABNORMAL LOW (ref 8.9–10.3)
Chloride: 102 mmol/L (ref 101–111)
Creatinine, Ser: 0.83 mg/dL (ref 0.44–1.00)
Creatinine, Ser: 0.92 mg/dL (ref 0.44–1.00)
GFR calc non Af Amer: 57 mL/min — ABNORMAL LOW (ref >60)
GLUCOSE: 182 mg/dL — AB (ref 65–99)
Glucose, Bld: 135 mg/dL — ABNORMAL HIGH (ref 65–99)
POTASSIUM: 4.5 mmol/L (ref 3.5–5.1)
POTASSIUM: 4.4 mmol/L (ref 3.5–5.1)
SODIUM: 136 mmol/L (ref 135–145)
Sodium: 136 mmol/L (ref 135–145)

## 2015-05-31 LAB — TROPONIN I
Troponin I: <0.03 ng/mL (ref <0.031)
Troponin I: <0.03 ng/mL (ref <0.031)

## 2015-05-31 LAB — CBC
HEMATOCRIT: 33 % — AB (ref 36.0–46.0)
HEMOGLOBIN: 10.5 g/dL — AB (ref 12.0–15.0)
MCH: 32.4 pg (ref 26.0–34.0)
MCHC: 31.8 g/dL (ref 30.0–36.0)
MCV: 101.9 fL — ABNORMAL HIGH (ref 78.0–100.0)
Platelets: 180 10*3/uL (ref 150–400)
RBC: 3.24 MIL/uL — AB (ref 3.87–5.11)
RDW: 12.4 % (ref 11.5–15.5)
WBC: 12.4 10*3/uL — ABNORMAL HIGH (ref 4.0–10.5)

## 2015-05-31 LAB — ECHOCARDIOGRAM COMPLETE
Height: 66 in
Weight: 2144 oz

## 2015-05-31 LAB — ABO/RH: ABO/RH(D): A NEG

## 2015-05-31 LAB — GLUCOSE, CAPILLARY: GLUCOSE-CAPILLARY: 104 mg/dL — AB (ref 65–99)

## 2015-05-31 NOTE — Progress Notes (Signed)
Patient ID: [name], female   DOB: August 06, 1932, 82 y.o.   MRN: [id number]    PROGRESS NOTE   [name]  [id number] DOB: 1932/01/18 DOA: 05/30/2015  PCP: Lanette Hampshire, MD   Brief Narrative:  82 y.o. WF with diet-controlled diabetes, hypertension, hypothyroidism, GERD, colon cancer stage III status post resection and chemotherapy in 2003. Patient presented to the hospital after sustaining a mechanical fall at home.  Assessment & Plan:   Principal Problem:   Fall, mechanical with subsequent closed right hip fracture ([place]) - s/p total right hip arthroplasty, post op day #1 - pt reports feeling better but still with pain in the right hip area - up with therapy  - non weight bearing on the RLE - provide analgesia as needed - aspirin 81 mg BID PO for DVT prophylaxis   Active Problems:   Post op blood loss anemia - mild - monitor counts - CBC in AM    Leukocytosis - reactive, post op - no signs of an infectious etiology - CBC in AM    Essential hypertension - reasonable inpatient control     Hypothyroidism - continue synthroid     Diabetes mellitus type 2, diet-controlled ([place])   History of colon cancer, stage III  DVT prophylaxis: Aspirin 81 mg PO BID  Code Status: Full  Family Communication: Patient at bedside  Disposition Plan: Pending PT eval   Consultants:   Ortho   Procedures:   Right hip arthroplasty 5/13  Antimicrobials:   None  Subjective: Pt reports pain in the right hip area  Objective: Filed Vitals:   05/30/15 2300 05/30/15 2313 05/31/15 0357 05/31/15 0900  BP:  118/58 116/65 118/61  Pulse: 82 85 80 90  Temp:  97.7 F (36.5 C) 98.2 F (36.8 C) 98.6 F (37 C)  TempSrc:  Oral Oral Oral  Resp: 14 16 16 13   Height:      Weight:      SpO2: 100% 96% 100% 86%    Intake/Output Summary (Last 24 hours) at 05/31/15 1037 Last data filed at 05/31/15 0354  Gross per 24 hour  Intake 3276.25 ml  Output   1430 ml  Net  1846.25 ml   Filed Weights   05/30/15 1234  Weight: 60.782 kg (134 lb)    Examination:  General exam: Appears calm and comfortable  Respiratory system: Clear to auscultation. Respiratory effort normal. Diminished breath sounds at bases  Cardiovascular system: S1 & S2 heard, RRR. No JVD, rubs, gallops or clicks. No pedal edema. Gastrointestinal system: Abdomen is nondistended, soft and nontender.  Central nervous system: Alert and oriented. No focal neurological deficits. Extremities: Symmetric 5 x 5 power. TTP at the right hip area   Data Reviewed: I have personally reviewed following labs and imaging studies  CBC:  Recent Labs Lab 05/30/15 1258 05/31/15 0807  WBC 10.3 12.4*  NEUTROABS 8.0*  --   HGB 13.5 10.5*  HCT 42.4 33.0*  MCV 102.7* 101.9*  PLT 238 99991111   Basic Metabolic Panel:  Recent Labs Lab 05/30/15 1258 05/30/15 1555 05/31/15 0140 05/31/15 0807  NA 136  --  136 136  K 4.5  --  4.5 4.4  CL 103  --  102 102  CO2 24  --  26 23  GLUCOSE 110*  --  182* 135*  BUN 16  --  13 12  CREATININE 0.87  --  0.83 0.92  CALCIUM 9.2 9.0 8.7* 8.5*   Liver Function Tests:  Recent Labs Lab 05/30/15 1258 05/30/15 1555  AST 33  --   ALT 25  --   ALKPHOS 58  --   BILITOT 0.5  --   PROT 6.7  --   ALBUMIN 3.5 3.7   Cardiac Enzymes:  Recent Labs Lab 05/30/15 2158 05/31/15 0140 05/31/15 0807  TROPONINI <0.03 <0.03 <0.03   CBG:  Recent Labs Lab 05/30/15 1808 05/30/15 2157  GLUCAP 133* 148*   Urine analysis:    Component Value Date/Time   COLORURINE YELLOW 05/30/2015 1751   APPEARANCEUR CLEAR 05/30/2015 1751   LABSPEC 1.015 05/30/2015 1751   PHURINE 7.5 05/30/2015 1751   GLUCOSEU NEGATIVE 05/30/2015 1751   HGBUR NEGATIVE 05/30/2015 1751   BILIRUBINUR NEGATIVE 05/30/2015 1751   BILIRUBINUR neg 07/29/2013 0952   KETONESUR 15* 05/30/2015 1751   PROTEINUR NEGATIVE 05/30/2015 1751   PROTEINUR neg 07/29/2013 0952   UROBILINOGEN negative 07/29/2013  0952   UROBILINOGEN 0.2 12/24/2010 0729   NITRITE NEGATIVE 05/30/2015 1751   NITRITE neg 07/29/2013 0952   LEUKOCYTESUR SMALL* 05/30/2015 1751   Recent Results (from the past 240 hour(s))  Surgical pcr screen     Status: None   Collection Time: 05/30/15  6:33 PM  Result Value Ref Range Status   MRSA, PCR NEGATIVE NEGATIVE Final   Staphylococcus aureus NEGATIVE NEGATIVE Final    Radiology Studies: Dg Chest 1 View 05/30/2015 No acute cardiopulmonary disease.   Dg Pelvis 1-2 Views 05/30/2015  Impacted angulated right midcervical femur fracture.  Pelvis Portable 05/30/2015  Right hip arthroplasty in anatomic alignment.  Dg Hip Operative Unilat With Pelvis Right 05/30/2015 Status post total right hip arthroplasty. No adverse features identified.  Dg Femur, Min 2 Views Right 05/30/2015 Midcervical right femur fracture.   Scheduled Meds: . aspirin EC  81 mg Oral BID PC  . docusate sodium  100 mg Oral BID  . levothyroxine  200 mcg Oral QAC breakfast  . metoCLOPramide      . nebivolol  10 mg Oral Daily  . nortriptyline  75 mg Oral q morning - 10a  . pantoprazole  40 mg Oral Daily  . senna  1 tablet Oral BID  . simvastatin  20 mg Oral QHS  . zolpidem  5 mg Oral QHS   Continuous Infusions: . sodium chloride 75 mL/hr at 05/30/15 1733    LOS: 1 day   Time spent: 20 minutes   Faye Ramsay, MD [facility] Pager [phone]  If 7PM-7AM, please contact night-coverage [email or web address] Password [facility] 05/31/2015, 10:37 AM

## 2015-05-31 NOTE — Progress Notes (Signed)
Patient ID: [name], female   DOB: 19-Jun-1932, 82 y.o.   MRN: [id number]    Subjective: 1 Day Post-Op Procedure(s) (LRB): TOTAL HIP ARTHROPLASTY ANTERIOR APPROACH (Right) Patient reports pain as 1 on 0-10 scale.   Denies CP or SOB.  Pt has a foley. Positive flatus. Pt concerned about dry lips Objective: Vital signs in last 24 hours: Temp:  [97.3 F (36.3 C)-98.6 F (37 C)] 98.2 F (36.8 C) (05/14 0357) Pulse Rate:  [73-99] 80 (05/14 0357) Resp:  [11-25] 16 (05/14 0357) BP: (100-168)/(58-93) 116/65 mmHg (05/14 0357) SpO2:  [92 %-100 %] 100 % (05/14 0357) FiO2 (%):  [0 %] 0 % (05/13 1804) Weight:  [60.782 kg (134 lb)] 60.782 kg (134 lb) (05/13 1234)  Intake/Output from previous day: 05/13 0701 - 05/14 0700 In: 3276.3 [I.V.:3176.3; IV Piggyback:100] Out: 1430 [Urine:950; Emesis/NG output:30; Blood:450] Intake/Output this shift:    Labs:  Recent Labs  05/30/15 1258  HGB 13.5    Recent Labs  05/30/15 1258  WBC 10.3  RBC 4.13  HCT 42.4  PLT 238    Recent Labs  05/30/15 1258 05/30/15 1555 05/31/15 0140  NA 136  --  136  K 4.5  --  4.5  CL 103  --  102  CO2 24  --  26  BUN 16  --  13  CREATININE 0.87  --  0.83  GLUCOSE 110*  --  182*  CALCIUM 9.2 9.0 8.7*   No results for input(s): LABPT, INR in the last 72 hours.  Physical Exam: Neurologically intact ABD soft Sensation intact distally Dorsiflexion/Plantar flexion intact Incision: no drainage Compartment soft  Assessment/Plan: 1 Day Post-Op Procedure(s) (LRB): TOTAL HIP ARTHROPLASTY ANTERIOR APPROACH (Right) Advance diet Up with therapy - non weight bearing right LE Talked to nurse - she will assist the pt with ordering breakfast, water and getting petroleum for her lips  Alexis Mizuno, Darla Lesches for Dr. Melina Schools [facility] [phone] 05/31/2015, 8:47 AM

## 2015-05-31 NOTE — Evaluation (Signed)
Physical Therapy Evaluation Patient Details Name: [name] MRN: [id number] DOB: 1933/01/17 Today's Date: 05/31/2015   History of Present Illness    82 y.o. WF with diet-controlled diabetes, hypertension, hypothyroidism, GERD, colon cancer stage III status post resection and chemotherapy in 2003. Patient presented to the hospital after sustaining a mechanical fall at home.  Underwent right THA anterior approach 5/13, NWB on limb   Clinical Impression  Pt presents with moderate to severe functional limitations due to mild pain, decr WB, decr ROM/activity precautions, decr strength, resulting in dependency on ambulatory device and human assist for basic mobility.  Able to transfer to chair but not progress gait this session.  Due to environmental barriers pt will likely require ST SNF for postacute rehab needs to address deficits and regain maximum independence.  Will initiate PT in acute setting and if progresses faster than expected, will update d/c recommendations.  See below for details of exam findings and refer to Care Plan for goals of care.   Follow Up Recommendations SNF (pending progress)    Equipment Recommendations  None recommended by PT (pt has RW?)    Recommendations for Other Services       Precautions / Restrictions Precautions Precautions: Anterior Hip Precaution Booklet Issued: No Precaution Comments: able to maintain THA and NWB prec for exam Restrictions Weight Bearing Restrictions: Yes RLE Weight Bearing: Non weight bearing      Mobility  Bed Mobility Overal bed mobility: Needs Assistance Bed Mobility: Supine to Sit     Supine to sit: HOB elevated;Mod assist     General bed mobility comments: to Left EOB pt needs assist to operated leg and use of bed pad to scoot hips to edge and sit up; pt sits with left hip preference thought denies pain  Transfers Overall transfer level: Needs assistance Equipment used: Rolling walker (2 wheeled) Transfers:  Sit to/from [facility] Sit to Stand: Mod assist;From elevated surface Stand pivot transfers: Min assist       General transfer comment: needs incr assist to lift off lower surfaces, cues for optimal hand placement, and is able to recalla nd maintain NWB though worries about how to do it consistently.  Placed shoe but may transfer better without as shoe has no-slip sole.  90 pivotal steps to [facility], then stood to replace with recliner.  Pt grimaces but denies significant pain with mobility  Ambulation/Gait                Stairs            Wheelchair Mobility    Modified Rankin (Stroke Patients Only)       Balance Overall balance assessment: No apparent balance deficits (not formally assessed)                                           Pertinent Vitals/Pain Pain Assessment: No/denies pain    Home Living Family/patient expects to be discharged to:: Private residence Living Arrangements: Children Available Help at Discharge: Family;Available 24 hours/day Type of Home: Mobile home Home Access: Stairs to enter Entrance Stairs-Rails: Psychiatric nurse of Steps: 6 Home Layout: One level Home Equipment: Walker - 2 wheels Additional Comments: was able to use RW in home after TKA     Prior Function Level of Independence: Independent  Hand Dominance        Extremity/Trunk Assessment   Upper Extremity Assessment: Overall WFL for tasks assessed           Lower Extremity Assessment: RLE deficits/detail RLE Deficits / Details: s/p anterior THA    Cervical / Trunk Assessment: Normal  Communication   Communication: No difficulties  Cognition Arousal/Alertness: Awake/alert Behavior During Therapy: WFL for tasks assessed/performed Overall Cognitive Status: Within Functional Limits for tasks assessed                      General Comments General comments (skin integrity, edema, etc.):  incision covered dsg clean; applied ice bad to hip after session    Exercises        Assessment/Plan    PT Assessment Patient needs continued PT services  PT Diagnosis Acute pain;Difficulty walking   PT Problem List Pain;Decreased knowledge of use of DME;Decreased mobility;Decreased strength;Decreased range of motion  PT Treatment Interventions DME instruction;Gait training;Stair training;Functional mobility training;Therapeutic exercise;Therapeutic activities;Modalities;Patient/family education   PT Goals (Current goals can be found in the Care Plan section) Acute Rehab PT Goals Patient Stated Goal: get home, return to walking PT Goal Formulation: With patient Time For Goal Achievement: 06/14/15 Potential to Achieve Goals: Good    Frequency Min 5X/week   Barriers to discharge Inaccessible home environment 6 steps to enter; mobile home    Co-evaluation               End of Session Equipment Utilized During Treatment: Gait belt   Patient left: in chair;with call bell/phone within reach;with family/visitor present Nurse Communication: Mobility status (asked to medicate patient as precaution for mobility)         Time: 1410-1450 PT Time Calculation (min) (ACUTE ONLY): 40 min   Charges:   PT Evaluation $PT Eval Moderate Complexity: 1 Procedure PT Treatments $Gait Training: 8-22 mins $Therapeutic Activity: 8-22 mins   PT G Codes:        Herbie Drape 05/31/2015, 3:04 PM

## 2015-06-01 ENCOUNTER — Encounter (HOSPITAL_COMMUNITY): Payer: Self-pay | Admitting: Orthopedic Surgery

## 2015-06-01 ENCOUNTER — Inpatient Hospital Stay (HOSPITAL_COMMUNITY): Payer: Medicare Other

## 2015-06-01 LAB — CBC
HEMATOCRIT: 26.3 % — AB (ref 36.0–46.0)
Hemoglobin: 8.8 g/dL — ABNORMAL LOW (ref 12.0–15.0)
MCH: 33.8 pg (ref 26.0–34.0)
MCHC: 33.5 g/dL (ref 30.0–36.0)
MCV: 101.2 fL — ABNORMAL HIGH (ref 78.0–100.0)
Platelets: 130 10*3/uL — ABNORMAL LOW (ref 150–400)
RBC: 2.6 MIL/uL — ABNORMAL LOW (ref 3.87–5.11)
RDW: 12.6 % (ref 11.5–15.5)
WBC: 8.3 10*3/uL (ref 4.0–10.5)

## 2015-06-01 LAB — BASIC METABOLIC PANEL
ANION GAP: 6 (ref 5–15)
BUN: 10 mg/dL (ref 6–20)
CALCIUM: 8.2 mg/dL — AB (ref 8.9–10.3)
CO2: 26 mmol/L (ref 22–32)
CREATININE: 0.76 mg/dL (ref 0.44–1.00)
Chloride: 102 mmol/L (ref 101–111)
Glucose, Bld: 135 mg/dL — ABNORMAL HIGH (ref 65–99)
Potassium: 3.7 mmol/L (ref 3.5–5.1)
SODIUM: 134 mmol/L — AB (ref 135–145)

## 2015-06-01 LAB — VITAMIN D 25 HYDROXY (VIT D DEFICIENCY, FRACTURES): Vit D, 25-Hydroxy: 29 ng/mL — ABNORMAL LOW (ref 30.0–100.0)

## 2015-06-01 LAB — GLUCOSE, CAPILLARY
Glucose-Capillary: 128 mg/dL — ABNORMAL HIGH (ref 65–99)
Glucose-Capillary: 150 mg/dL — ABNORMAL HIGH (ref 65–99)

## 2015-06-01 MED ORDER — HYDROCODONE-ACETAMINOPHEN 5-325 MG PO TABS: 1.0000 | ORAL_TABLET | ORAL | Status: DC | PRN

## 2015-06-01 MED ORDER — ASPIRIN 81 MG PO TBEC: 81.0000 mg | DELAYED_RELEASE_TABLET | Freq: Two times a day (BID) | ORAL | Status: DC

## 2015-06-01 MED ORDER — ZOLPIDEM TARTRATE 10 MG PO TABS: 10.0000 mg | ORAL_TABLET | Freq: Every day | ORAL | Status: DC

## 2015-06-01 NOTE — Discharge Planning (Signed)
RN confirmed with [facility] that since XRAY results were clear and pt also ok with DC, Dr. Is ok with discharging today (so long as skilled bed is confirmed).

## 2015-06-01 NOTE — Clinical Social Work Placement (Signed)
   CLINICAL SOCIAL WORK PLACEMENT  NOTE  Date:  06/01/2015  Patient Details  Name: GEORGEANNE FROME MRN: [id number] Date of Birth: 1932/03/03  Clinical Social Work is seeking post-discharge placement for this patient at the [place] level of care (*CSW will initial, date and re-position this form in  chart as items are completed):  Yes   Patient/family provided with [place] Work Department's list of facilities offering this level of care within the geographic area requested by the patient (or if unable, by the patient's family).  Yes   Patient/family informed of their freedom to choose among providers that offer the needed level of care, that participate in Medicare, Medicaid or managed care program needed by the patient, have an available bed and are willing to accept the patient.  Yes   Patient/family informed of [place]'s ownership interest in [facility] and [facility], as well as of the fact that they are under no obligation to receive care at these facilities.  PASRR submitted to EDS on 06/01/15     PASRR number received on 06/01/15     Existing PASRR number confirmed on       FL2 transmitted to all facilities in geographic area requested by pt/family on 06/01/15     FL2 transmitted to all facilities within larger geographic area on       Patient informed that his/her managed care company has contracts with or will negotiate with certain facilities, including the following:        Yes   Patient/family informed of bed offers received.  Patient chooses bed at       Physician recommends and patient chooses bed at      Patient to be transferred to   on  .  Patient to be transferred to facility by       Patient family notified on   of transfer.  Name of family member notified:        PHYSICIAN       Additional Comment:    _______________________________________________ Dulcy Fanny, LCSW 06/01/2015, 11:41 AM

## 2015-06-01 NOTE — Clinical Social Work Placement (Signed)
   CLINICAL SOCIAL WORK PLACEMENT  NOTE  Date:  06/01/2015  Patient Details  Name: [name] MRN: [id number] Date of Birth: [birth year]  Clinical Social Work is seeking post-discharge placement for this patient at the [place] level of care (*CSW will initial, date and re-position this form in  chart as items are completed):  Yes   Patient/family provided with [place] Work Department's list of facilities offering this level of care within the geographic area requested by the patient (or if unable, by the patient's family).  Yes   Patient/family informed of their freedom to choose among providers that offer the needed level of care, that participate in Medicare, Medicaid or managed care program needed by the patient, have an available bed and are willing to accept the patient.  Yes   Patient/family informed of [place]'s ownership interest in [facility] and [facility], as well as of the fact that they are under no obligation to receive care at these facilities.  PASRR submitted to EDS on 06/01/15     PASRR number received on 06/01/15     Existing PASRR number confirmed on       FL2 transmitted to all facilities in geographic area requested by pt/family on 06/01/15     FL2 transmitted to all facilities within larger geographic area on       Patient informed that his/her managed care company has contracts with or will negotiate with certain facilities, including the following:        Yes   Patient/family informed of bed offers received.  Patient chooses bed at [facility]     Physician recommends and patient chooses bed at      Patient to be transferred to [facility] on 06/01/15.  Patient to be transferred to facility by [facility]     Patient family notified on 06/01/15 of transfer.  Name of family member notified:  Son [name]     PHYSICIAN Please prepare priority discharge summary, including medications      Additional Comment:    _______________________________________________ Dulcy Fanny, LCSW 06/01/2015, 1:50 PM

## 2015-06-01 NOTE — NC FL2 (Signed)
[place] LEVEL OF CARE SCREENING TOOL     IDENTIFICATION  Patient Name: [name] Birthdate: Nov 10, 1932 Sex: female Admission Date (Current Location): 05/30/2015  [facility] and [place] Number:  [employer] and Address:  [facility] [employer], [place] [address] [place], [name] [address]      Provider Number: [id number]  Attending Physician Name and Address:  Theodis Blaze, MD  Relative Name and Phone Number:       Current Level of Care: Hospital Recommended Level of Care: [place] Prior Approval Number:    Date Approved/Denied:   PASRR Number: [id number]  Discharge Plan: SNF    Current Diagnoses: Patient Active Problem List   Diagnosis Date Noted  . Closed right hip fracture ([place]) 05/30/2015  . Chest pain syndrome 05/30/2015  . Fall at home 05/30/2015  . Closed right femoral fracture ([place]) 05/30/2015  . Displaced fracture of right femoral neck ([place]) 05/30/2015  . Fall   . Cough 07/02/2013  . Other dyspnea and respiratory abnormality 07/02/2013  . History of colon cancer, stage III 07/02/2013  . Macrocytosis without anemia 07/02/2013  . Chronic cholecystitis with calculus s/p lap chole 11/17/2012 11/19/2012  . GERD (gastroesophageal reflux disease) 11/16/2012  . Choledocholithiasis with obstruction 11/16/2012  . Calcification of aorta (HCC) 11/16/2012  . Atherosclerosis 11/16/2012  . Diabetes mellitus type 2, diet-controlled ([place]) 11/16/2012  . Hypothyroidism 11/15/2012  . Nonspecific (abnormal) findings on radiological and other examination of gastrointestinal tract 11/08/2012  . S/P right knee replacement 12/22/2010  . Preop cardiovascular exam 12/08/2010  . Essential hypertension 12/08/2010  . Hyperlipidemia 12/08/2010    Orientation RESPIRATION BLADDER Height & Weight     Self, Time, Situation, Place  Normal Continent Weight: 134 lb (60.782 kg) Height:  5\' 6"  (167.6 cm)  BEHAVIORAL  SYMPTOMS/MOOD NEUROLOGICAL BOWEL NUTRITION STATUS      Continent    AMBULATORY STATUS COMMUNICATION OF NEEDS Skin   Limited Assist Verbally Surgical wounds                       Personal Care Assistance Level of Assistance  Dressing, Bathing Bathing Assistance: Limited assistance   Dressing Assistance: Limited assistance     Functional Limitations Info             SPECIAL CARE FACTORS FREQUENCY                       Contractures Contractures Info: Present    Additional Factors Info  Allergies   Allergies Info: latex lidocain, sulfur           Current Medications (06/01/2015):  This is the current hospital active medication list Current Facility-Administered Medications  Medication Dose Route Frequency Provider Last Rate Last Dose  . acetaminophen (TYLENOL) tablet 650 mg  650 mg Oral Q6H PRN Rod Can, MD       Or  . acetaminophen (TYLENOL) suppository 650 mg  650 mg Rectal Q6H PRN Rod Can, MD      . aspirin EC tablet 81 mg  81 mg Oral BID PC Rod Can, MD   81 mg at 06/01/15 0939  . bisacodyl (DULCOLAX) suppository 10 mg  10 mg Rectal Daily PRN Samella Parr, NP   10 mg at 06/01/15 0939  . docusate sodium (COLACE) capsule 100 mg  100 mg Oral BID Rod Can, MD   100 mg at 06/01/15 0939  . HYDROcodone-acetaminophen (NORCO/VICODIN)  5-325 MG per tablet 1-2 tablet  1-2 tablet Oral Q6H PRN Rod Can, MD   1 tablet at 05/31/15 1445  . levothyroxine (SYNTHROID, LEVOTHROID) tablet 200 mcg  200 mcg Oral QAC breakfast Samella Parr, NP   200 mcg at 06/01/15 [id number]  . magnesium citrate solution 1 Bottle  1 Bottle Oral Once PRN Rod Can, MD      . menthol-cetylpyridinium (CEPACOL) lozenge 3 mg  1 lozenge Oral PRN Rod Can, MD       Or  . phenol (CHLORASEPTIC) mouth spray 1 spray  1 spray Mouth/Throat PRN Rod Can, MD      . metoCLOPramide (REGLAN) tablet 5-10 mg  5-10 mg Oral Q8H PRN Rod Can, MD   5 mg at 05/31/15  1445   Or  . metoCLOPramide (REGLAN) injection 5-10 mg  5-10 mg Intravenous Q8H PRN Rod Can, MD   10 mg at 05/30/15 2236  . morphine 2 MG/ML injection 0.5 mg  0.5 mg Intravenous Q2H PRN Samella Parr, NP   0.5 mg at 05/30/15 1756  . nebivolol (BYSTOLIC) tablet 10 mg  10 mg Oral Daily Samella Parr, NP   10 mg at 06/01/15 0939  . nitroGLYCERIN (NITROSTAT) SL tablet 0.4 mg  0.4 mg Sublingual Q5 min PRN Julianne Rice, MD   0.4 mg at 05/30/15 1401  . nortriptyline (PAMELOR) capsule 75 mg  75 mg Oral q morning - 10a Samella Parr, NP   75 mg at 06/01/15 [id number]  . ondansetron (ZOFRAN) tablet 4 mg  4 mg Oral Q6H PRN Rod Can, MD       Or  . ondansetron [facility]) injection 4 mg  4 mg Intravenous Q6H PRN Rod Can, MD      . pantoprazole (PROTONIX) EC tablet 40 mg  40 mg Oral Daily Samella Parr, NP   40 mg at 06/01/15 0939  . polyethylene glycol (MIRALAX / GLYCOLAX) packet 17 g  17 g Oral Daily PRN Samella Parr, NP      . senna (SENOKOT) tablet 8.6 mg  1 tablet Oral BID Rod Can, MD   8.6 mg at 06/01/15 0939  . simvastatin (ZOCOR) tablet 20 mg  20 mg Oral QHS Samella Parr, NP   20 mg at 05/31/15 2100  . sodium phosphate (FLEET) 7-19 GM/118ML enema 1 enema  1 enema Rectal Once PRN Samella Parr, NP      . zolpidem (AMBIEN) tablet 5 mg  5 mg Oral QHS Samella Parr, NP   5 mg at 05/31/15 2209     Discharge Medications: Please see discharge summary for a list of discharge medications.  Relevant Imaging Results:  Relevant Lab Results:   Additional Information SSN: [id number]  Dulcy Fanny, LCSW

## 2015-06-01 NOTE — Evaluation (Signed)
Occupational Therapy Evaluation and Discharge Patient Details Name: [name] MRN: [id number] DOB: 09/17/1932 Today's Date: 06/01/2015    History of Present Illness 82 yo admitted after fall with Rt femora neck fx s/p anterior THA. PMHx: diet-controlled diabetes, hypertension, hypothyroidism, GERD, colon cancer stage III status post resection and chemotherapy in 2003, and dyslipidemia, Rt TKA   Clinical Impression   This 82 yo female admitted and underwent above presents to acute OT with deficits below (see OT problem list) affecting her PLOF of Mod I to independent with basic ADLs. She will continue to benefit from follow up OT at SNF--we will defer remainder of OT to that facility.    Follow Up Recommendations  SNF    Equipment Recommendations   (TBD at next venue)       Precautions / Restrictions Precautions Precautions: Fall Restrictions Weight Bearing Restrictions: Yes RLE Weight Bearing: Weight bearing as tolerated      Mobility Bed Mobility Overal bed mobility: Needs Assistance Bed Mobility: Supine to Sit     Supine to sit: Min guard     General bed mobility comments: Pt up on BSC upon entering room  Transfers Overall transfer level: Needs assistance Equipment used: Rolling walker (2 wheeled) Transfers: Sit to/from Stand Sit to Stand: Min assist         General transfer comment: cues for hand placement and sequence with assist for anterior translation and stabilization due to initial posterior bias  x 2 trials     Balance Overall balance assessment: Needs assistance Sitting-balance support: No upper extremity supported;Feet supported Sitting balance-Leahy Scale: Good     Standing balance support: Bilateral upper extremity supported;During functional activity Standing balance-Leahy Scale: Poor Standing balance comment: Reliant on RW                            ADL Overall ADL's : Needs assistance/impaired Eating/Feeding:  Independent;Sitting   Grooming: Min guard;Wash/dry hands;Standing   Upper Body Bathing: Set up;Sitting   Lower Body Bathing: Minimal assistance (min guard A sit<>stand)   Upper Body Dressing : Set up;Sitting   Lower Body Dressing: Moderate assistance (min guard A sit<>stand)   Toilet Transfer: Minimal assistance;Ambulation;RW;BSC   Toileting- Water quality scientist and Hygiene: Min guard;Sit to/from stand                         Pertinent Vitals/Pain Pain Assessment: 0-10 Pain Score: 3  Pain Location: right hip Pain Descriptors / Indicators: Sore;Aching Pain Intervention(s): Monitored during session;Repositioned     Hand Dominance Right   Extremity/Trunk Assessment Upper Extremity Assessment Upper Extremity Assessment: Overall WFL for tasks assessed           Communication Communication Communication: No difficulties   Cognition Arousal/Alertness: Awake/alert Behavior During Therapy: WFL for tasks assessed/performed Overall Cognitive Status: Within Functional Limits for tasks assessed                                Home Living Family/patient expects to be discharged to:: Private residence Living Arrangements: Children Available Help at Discharge: Family;Available PRN/intermittently Type of Home: Mobile home Home Access: Stairs to enter Entrance Stairs-Number of Steps: 6 Entrance Stairs-Rails: Right;Left Home Layout: One level     Bathroom Shower/Tub: Tub/shower unit (pt states she sponge baths)         Home Equipment: Walker - 2 wheels  Additional Comments: was able to use RW in home after TKA       Prior Functioning/Environment Level of Independence: Independent             OT Diagnosis: Generalized weakness;Acute pain   OT Problem List: Decreased strength;Decreased range of motion;Impaired balance (sitting and/or standing);Pain;Decreased knowledge of use of DME or AE      OT Goals(Current goals can be found in the  care plan section) Acute Rehab OT Goals Patient Stated Goal: to rehab then home   OT Frequency:                End of Session Equipment Utilized During Treatment: Gait belt;Rolling walker  Activity Tolerance: Patient tolerated treatment well Patient left: in chair;with call bell/phone within reach;with chair alarm set   Time: [id number] OT Time Calculation (min): 22 min Charges:  OT General Charges $OT Visit: 1 Procedure OT Evaluation $OT Eval Moderate Complexity: 1 Procedure  Almon Register [id number] 06/01/2015, 12:19 PM

## 2015-06-01 NOTE — Discharge Planning (Signed)
[facility] called to attempt to give report.  Told RN not available at this time and would call hosp/RN when ready.

## 2015-06-01 NOTE — Discharge Planning (Signed)
Pt IV removed.  Pt DC papers printed and included in packet to facility.  Scripts also in packet.  Report called to [facility], s/w Birdena Jubilee, RN.  RN assessment and VS revealed stability for DC to facility.  EMS contacted to transport. Pt aware.

## 2015-06-01 NOTE — Clinical Social Work Note (Addendum)
Clinical Social Work Assessment  Patient Details  Name: [name] MRN: [id number] Date of Birth: 17-Mar-1932  Date of referral:  06/01/15               Reason for consult:  Facility Placement                Permission sought to share information with:  Facility Sport and exercise psychologist, Family Supports Permission granted to share information::  Yes, Verbal Permission Granted  Name::      [name] son)  Agency::   ([place] and [employer] are preferences.  Agreeable to [facility] search)  Relationship::     Contact Information:     Housing/Transportation Living arrangements for the past 2 months:  [place] of Information:  Patient Patient Interpreter Needed:  None Criminal Activity/Legal Involvement Pertinent to Current Situation/Hospitalization:  No - Comment as needed Significant Relationships:  Adult Children Lives with:    Do you feel safe going back to the place where you live?    Need for family participation in patient care:  Yes (Comment) (patient requests)  Care giving concerns:  Son has no placement concerns, but did express concerns re: patient not having breakfast tray yesterday and having to wait until 1pm to eat.  CSW apologized for this mishap and directed son to the Charge RN/Unit Director to assist with concern.     Social Worker assessment / plan:  CSW met with patient who is aware that she will need STR at SNF once medically discharged.  Patient states she is from [place] and would like to stay as close to home as possible.  CSW spoke to son, [name] as well per patient's request to inform of recommendation.  Son was already aware as he was present at time of PT evaluation.  Son is also agreeable to SNF placement preferring [facility].  Employment status:  Retired Nurse, adult PT Recommendations:  [place] / Referral to community resources:   [place]  Patient/Family's Response to care:  Patient and son are both agreeable to STR at [facility]. Patient/Family's Understanding of and Emotional Response to Diagnosis, Current Treatment, and Prognosis:  Patient expressed frustration with having to receive STR at SNF instead of home.  Patient wishes to return home, but realizes that she does not have the support or the strength to do so.  Patient is agreeable to SNF.  CSW provided support for patient's frustration.    Emotional Assessment Appearance:  Appears stated age Attitude/Demeanor/Rapport:    Affect (typically observed):  Accepting, Adaptable Orientation:  Oriented to Self, Oriented to Place, Oriented to  Time, Oriented to Situation Alcohol / Substance use:    Psych involvement (Current and /or in the community):  No (Comment)  Discharge Needs  Concerns to be addressed:  No discharge needs identified Readmission within the last 30 days:  No Current discharge risk:  None Barriers to Discharge:  Continued Medical Work up   [employer], LCSW 06/01/2015, 11:36 AM

## 2015-06-01 NOTE — Progress Notes (Signed)
Physical Therapy Treatment Patient Details Name: [name] MRN: [id number] DOB: 1932/01/24 Today's Date: 06/01/2015    History of Present Illness 82 yo admitted after fall with Rt femora neck fx s/p anterior THA. PMHx: diet-controlled diabetes, hypertension, hypothyroidism, GERD, colon cancer stage III status post resection and chemotherapy in 2003, and dyslipidemia, Rt TKA    PT Comments    Pt eager and excited to be able to ambulate today. Pt educated for HEP, transfers and gait with encouragement to increase mobility throughout the day with nursing and increase movement of RLE. Pt noted to drop to 86% on RA with gait with return to [id number] with application of 2L O2 end of session. Will continue to follow to maximize mobility and independence.   Follow Up Recommendations  SNF;Supervision for mobility/OOB     Equipment Recommendations       Recommendations for Other Services       Precautions / Restrictions Precautions Precautions: Fall Restrictions RLE Weight Bearing: Weight bearing as tolerated    Mobility  Bed Mobility Overal bed mobility: Needs Assistance Bed Mobility: Supine to Sit     Supine to sit: Min guard     General bed mobility comments: cues for sequence with increased time, HOB 30degrees and use of rail   Transfers Overall transfer level: Needs assistance   Transfers: Sit to/from Stand Sit to Stand: Min assist         General transfer comment: cues for hand placement and sequence with assist for anterior translation and stabilization due to initial posterior bias  x 2 trials   Ambulation/Gait Ambulation/Gait assistance: Min guard Ambulation Distance (Feet): 100 Feet Assistive device: Rolling walker (2 wheeled) Gait Pattern/deviations: Step-through pattern;Decreased stride length;Trunk flexed   Gait velocity interpretation: Below normal speed for age/gender General Gait Details: max cues to look up, position self appropriately in RW and for  stability, Pt noted to fatigue and have more difficulty directing RW last 30'. Pt noted to have decreased O2 sats end of session dropping to 86% on RA and placed on 2L with sats 94%   Stairs            Wheelchair Mobility    Modified Rankin (Stroke Patients Only)       Balance Overall balance assessment: Needs assistance   Sitting balance-Leahy Scale: Good       Standing balance-Leahy Scale: Poor                      Cognition Arousal/Alertness: Awake/alert Behavior During Therapy: Flat affect Overall Cognitive Status: Within Functional Limits for tasks assessed                      Exercises General Exercises - Lower Extremity Heel Slides: AROM;Right;10 reps;Supine Hip ABduction/ADduction: AAROM;Right;10 reps;Supine    General Comments        Pertinent Vitals/Pain Pain Assessment: 0-10 Pain Score: 4  Pain Location: Rt hip with movement Pain Intervention(s): Limited activity within patient's tolerance;Monitored during session;Repositioned    Home Living                      Prior Function            PT Goals (current goals can now be found in the care plan section) Progress towards PT goals: Progressing toward goals    Frequency       PT Plan Current plan remains appropriate    Co-evaluation  End of Session Equipment Utilized During Treatment: Gait belt Activity Tolerance: Patient tolerated treatment well Patient left: in chair;with call bell/phone within reach;with chair alarm set     Time: 1005-1028 PT Time Calculation (min) (ACUTE ONLY): 23 min  Charges:  $Gait Training: 8-22 mins $Therapeutic Exercise: 8-22 mins                    G Codes:      Melford Aase 2015/06/02, 10:38 AM Elwyn Reach, [place]

## 2015-06-01 NOTE — Progress Notes (Signed)
OT Cancellation Note  Patient Details Name: [name] MRN: [id number] DOB: 05-Aug-1932   Cancelled Treatment:    Reason Eval/Treat Not Completed: Patient at procedure or test/ unavailable Pt currently down for a chest xray--will re-attempt eval as schedule allows.  Almon Register [id number] 06/01/2015, 11:31 AM

## 2015-06-01 NOTE — Progress Notes (Signed)
   Subjective:  Patient reports pain as mild to moderate.  No events. Awaiting PT eval  Objective:   VITALS:   Filed Vitals:   05/31/15 0900 05/31/15 1345 05/31/15 2138 06/01/15 0626  BP: 118/61 93/48 119/62 127/63  Pulse: 90 86 91 86  Temp: 98.6 F (37 C) 99 F (37.2 C) 98.9 F (37.2 C) 98.4 F (36.9 C)  TempSrc: Oral Oral Oral Oral  Resp: 13 12 16 16   Height:      Weight:      SpO2: 86% 94% 93% 96%    ABD soft Intact pulses distally Dorsiflexion/Plantar flexion intact Incision: dressing C/D/I Compartment soft   Lab Results  Component Value Date   WBC 8.3 06/01/2015   HGB 8.8* 06/01/2015   HCT 26.3* 06/01/2015   MCV 101.2* 06/01/2015   PLT 130* 06/01/2015   BMET    Component Value Date/Time   NA 136 05/31/2015 0807   NA 140 07/08/2014 0959   NA 146* 06/29/2011 1028   K 4.4 05/31/2015 0807   K 4.6 07/08/2014 0959   K 5.1* 06/29/2011 1028   CL 102 05/31/2015 0807   CL 101 07/02/2012 0935   CL 100 06/29/2011 1028   CO2 23 05/31/2015 0807   CO2 32* 07/08/2014 0959   CO2 32 06/29/2011 1028   GLUCOSE 135* 05/31/2015 0807   GLUCOSE 105 07/08/2014 0959   GLUCOSE 137* 07/02/2012 0935   GLUCOSE 136* 06/29/2011 1028   BUN 12 05/31/2015 0807   BUN 17.2 07/08/2014 0959   BUN 18 06/29/2011 1028   CREATININE 0.92 05/31/2015 0807   CREATININE 1.0 07/08/2014 0959   CREATININE 1.1 06/29/2011 1028   CALCIUM 8.5* 05/31/2015 0807   CALCIUM 9.9 07/08/2014 0959   CALCIUM 9.1 06/29/2011 1028   GFRNONAA 57* 05/31/2015 0807   GFRAA >60 05/31/2015 [id number]     Assessment/Plan: 2 Days Post-Op   Principal Problem:   Closed right hip fracture (HCC) Active Problems:   Essential hypertension   Hyperlipidemia   Hypothyroidism   GERD (gastroesophageal reflux disease)   Diabetes mellitus type 2, diet-controlled (HCC)   History of colon cancer, stage III   Chest pain syndrome   Fall at home   Closed right femoral fracture [facility])   Fall   Displaced fracture of right  femoral neck ([place])   WBAT with walker DVT ppx: ASA 81 mg PO BID, TEDs, SCDs PT/OT PO pain control D/C plan pending PT eval   Elek Holderness, Horald Pollen 06/01/2015, 7:51 AM   Rod Can, MD Cell [phone]

## 2015-06-01 NOTE — Discharge Summary (Addendum)
Physician Discharge Summary  [name] [id number] DOB: 11-25-1932 DOA: 05/30/2015  PCP: Lanette Hampshire, MD  Admit date: 05/30/2015 Discharge date: 06/01/2015  Recommendations for Outpatient Follow-up:  1. Pt will need to follow up with PCP in 1 weeks post discharge or can see provider at the facility upon arrival  2. Please obtain BMP in 1 week to evaluate electrolytes and kidney function 3. Please also check CBC in 1-2 days to evaluate Hg and Hct levels, plt level  4. If Hg < 8, may need supportive blood transfusion 5. Please note that post op hypoxia noted only with exertion, CXR looks clear 6. Pt saturating > 92% on RA with rest and exertional hypoxia corrects with 2 L [place] oxygen  7. May be able to come off oxygen once blood counts stabilize 8. Pt denies dyspnea here and also denies chest pain, if the symptoms appear, may need further evaluation with CT chest angio  9. Pt discharged on Aspirin 81 mg PO BID for DVT prophylaxis per ortho team 10. Pt needs to see Dr. Lyla Glassing in 2 weeks   Discharge Diagnoses:  Principal Problem:   Closed right hip fracture [facility]) Active Problems:   Essential hypertension   Hyperlipidemia   Hypothyroidism   GERD (gastroesophageal reflux disease)   Diabetes mellitus type 2, diet-controlled ([place])  Discharge Condition: Stable  Diet recommendation: Heart healthy diet discussed in details    Brief Narrative:  82 y.o. WF with diet-controlled diabetes, hypertension, hypothyroidism, GERD, colon cancer stage III status post resection and chemotherapy in 2003. Patient presented to the hospital after sustaining a mechanical fall at home.  Assessment & Plan:  Principal Problem:  Fall, mechanical with subsequent closed right hip fracture ([place]) - s/p total right hip arthroplasty, post op day #2 - pt reports feeling better but still with soreness in the right hip area  - up with therapy  - non weight bearing on the RLE - provide analgesia  as needed - aspirin 81 mg BID PO for DVT prophylaxis   Active Problems:  Post op blood loss anemia with thrombocytopenia  - repeat CBC in 1-2 days and transfuse for Hg < 8     Exertional hypoxia - corrects with 2 L [place] - CXR clear - other VS stable, no tachycardia and no tachypnea - no fevers and no leukocytosis  - monitor for now but if pt becomes symptomatic, consider CT chest to rule out PE   Leukocytosis - reactive, post op - no signs of an infectious etiology - resolved    Essential hypertension - reasonable inpatient control    Hypothyroidism - continue synthroid    Diabetes mellitus type 2, diet-controlled ([name])  History of colon cancer, stage III  DVT prophylaxis: Aspirin 81 mg PO BID  Code Status: Full  Family Communication: Patient at bedside  Disposition Plan: SNF today   Consultants:   Ortho  Procedures:   Right hip arthroplasty 5/13  Antimicrobials:   None  Discharge Exam: Filed Vitals:   05/31/15 2138 06/01/15 0626  BP: 119/62 127/63  Pulse: 91 86  Temp: 98.9 F (37.2 C) 98.4 F (36.9 C)  Resp: 16 16   Filed Vitals:   05/31/15 0900 05/31/15 1345 05/31/15 2138 06/01/15 0626  BP: 118/61 93/48 119/62 127/63  Pulse: 90 86 91 86  Temp: 98.6 F (37 C) 99 F (37.2 C) 98.9 F (37.2 C) 98.4 F (36.9 C)  TempSrc: Oral Oral Oral Oral  Resp: 13 12 16 16   Height:  Weight:      SpO2: 86% 94% 93% 96%    General: Pt is alert, follows commands appropriately, not in acute distress Cardiovascular: Regular rate and rhythm, no rubs, no gallops Respiratory: Clear to auscultation bilaterally, no wheezing, no crackles, no rhonchi Abdominal: Soft, non tender, non distended, bowel sounds +, no guarding Extremities: no edema, no cyanosis,   Discharge Instructions     Medication List    STOP taking these medications        amoxicillin 500 MG capsule  Commonly known as:  AMOXIL     aspirin 325 MG tablet  Replaced by:  aspirin 81 MG  EC tablet      TAKE these medications        acetaminophen 500 MG tablet  Commonly known as:  TYLENOL  Take 1,000 mg by mouth 2 (two) times daily.     aspirin 81 MG EC tablet  Take 1 tablet (81 mg total) by mouth 2 (two) times daily after a meal.     CALTRATE 600 PLUS-VIT D PO  Take 1 tablet by mouth 2 (two) times daily.     CENTRUM SILVER ULTRA WOMENS PO  Take by mouth.     PRESERVISION AREDS PO  Take 2 capsules by mouth daily.     docusate sodium 100 MG capsule  Commonly known as:  COLACE  Take 100 mg by mouth 3 (three) times daily as needed for constipation.     HYDROcodone-acetaminophen 5-325 MG tablet  Commonly known as:  NORCO/VICODIN  Take 1-2 tablets by mouth every 4 (four) hours as needed for moderate pain.     lactobacillus acidophilus Tabs tablet  Take 1 tablet by mouth 2 (two) times daily.     levothyroxine 200 MCG tablet  Commonly known as:  SYNTHROID, LEVOTHROID  Take 200 mcg by mouth daily before breakfast.     nebivolol 10 MG tablet  Commonly known as:  BYSTOLIC  Take 10 mg by mouth daily.     nortriptyline 25 MG capsule  Commonly known as:  PAMELOR  Take 75 mg by mouth every morning.     omeprazole 20 MG capsule  Commonly known as:  PRILOSEC  Take 20 mg by mouth 2 (two) times daily.     simvastatin 20 MG tablet  Commonly known as:  ZOCOR  Take 20 mg by mouth at bedtime.     vitamin B-12 1000 MCG tablet  Commonly known as:  CYANOCOBALAMIN  Take 1,000 mcg by mouth daily.     vitamin C 500 MG tablet  Commonly known as:  ASCORBIC ACID  Take 500 mg by mouth daily.     Vitamin D (Ergocalciferol) 50000 units Caps capsule  Commonly known as:  DRISDOL  Take 1 capsule (50,000 Units total) by mouth every 14 (fourteen) days.     vitamin E 400 UNIT capsule  Take 400 Units by mouth 2 (two) times daily.     zolpidem 10 MG tablet  Commonly known as:  AMBIEN  Take 1 tablet (10 mg total) by mouth at bedtime.           Follow-up Information     Follow up with Swinteck, Horald Pollen, MD In 2 weeks.   Specialty:  Orthopedic Surgery   Why:  For wound re-check   Contact information:   [address] [place] [address] [phone]       Follow up with Lanette Hampshire, MD.   Specialty:  Family Medicine  Contact information:   [place] [address] [phone]        The results of significant diagnostics from this hospitalization (including imaging, microbiology, ancillary and laboratory) are listed below for reference.     Microbiology: Recent Results (from the past 240 hour(s))  Surgical pcr screen     Status: None   Collection Time: 05/30/15  6:33 PM  Result Value Ref Range Status   MRSA, PCR NEGATIVE NEGATIVE Final   Staphylococcus aureus NEGATIVE NEGATIVE Final    Comment:        The Xpert SA Assay (FDA approved for NASAL specimens in patients over 22 years of age), is one component of a comprehensive surveillance program.  Test performance has been validated by [facility] for patients greater than or equal to 71 year old. It is not intended to diagnose infection nor to guide or monitor treatment.      Labs: Basic Metabolic Panel:  Recent Labs Lab 05/30/15 1258 05/30/15 1555 05/31/15 0140 05/31/15 0807 06/01/15 0659  NA 136  --  136 136 134*  K 4.5  --  4.5 4.4 3.7  CL 103  --  102 102 102  CO2 24  --  26 23 26   GLUCOSE 110*  --  182* 135* 135*  BUN 16  --  13 12 10   CREATININE 0.87  --  0.83 0.92 0.76  CALCIUM 9.2 9.0 8.7* 8.5* 8.2*   Liver Function Tests:  Recent Labs Lab 05/30/15 1258 05/30/15 1555  AST 33  --   ALT 25  --   ALKPHOS 58  --   BILITOT 0.5  --   PROT 6.7  --   ALBUMIN 3.5 3.7   CBC:  Recent Labs Lab 05/30/15 1258 05/31/15 0807 06/01/15 0659  WBC 10.3 12.4* 8.3  NEUTROABS 8.0*  --   --   HGB 13.5 10.5* 8.8*  HCT 42.4 33.0* 26.3*  MCV 102.7* 101.9* 101.2*  PLT 238 180 130*   Cardiac Enzymes:  Recent Labs Lab  05/30/15 2158 05/31/15 0140 05/31/15 0807  TROPONINI <0.03 <0.03 <0.03    CBG:  Recent Labs Lab 05/30/15 1808 05/30/15 2157 05/31/15 2154 06/01/15 0632  GLUCAP 133* 148* 104* 128*   SIGNED: Time coordinating discharge: 30 minutes  Faye Ramsay, MD  [facility] 06/01/2015, 10:32 AM Pager [phone]  If 7PM-7AM, please contact night-coverage [email or web address] Password [email or web address]

## 2015-06-01 NOTE — Discharge Instructions (Signed)
°Dr. Brian Swinteck °Joint Replacement Specialist °[facility] °[address] °[place],  [address] °[phone] ° ° °TOTAL HIP REPLACEMENT POSTOPERATIVE DIRECTIONS ° ° ° °Hip Rehabilitation, Guidelines Following Surgery  ° °WEIGHT BEARING °Weight bearing as tolerated with assist device (walker, cane, etc) as directed, use it as long as suggested by your surgeon or therapist, typically at least 4-6 weeks. ° °The results of a hip operation are greatly improved after range of motion and muscle strengthening exercises. Follow all safety measures which are given to protect your hip. If any of these exercises cause increased pain or swelling in your joint, decrease the amount until you are comfortable again. Then slowly increase the exercises. Call your caregiver if you have problems or questions.  ° °HOME CARE INSTRUCTIONS  °Most of the following instructions are designed to prevent the dislocation of your new hip.  °Remove items at home which could result in a fall. This includes throw rugs or furniture in walking pathways.  °Continue medications as instructed at time of discharge. °· You may have some home medications which will be placed on hold until you complete the course of blood thinner medication. °· You may start showering once you are discharged home. Do not remove your dressing. °Do not put on socks or shoes without following the instructions of your caregivers.   °Sit on chairs with arms. Use the chair arms to help push yourself up when arising.  °Arrange for the use of a toilet seat elevator so you are not sitting low.  °· Walk with walker as instructed.  °You may resume a sexual relationship in one month or when given the OK by your caregiver.  °Use walker as long as suggested by your caregivers.  °You may put full weight on your legs and walk as much as is comfortable. °Avoid periods of inactivity such as sitting longer than an hour when not asleep. This helps prevent  blood clots.  °You may return to work once you are cleared by your surgeon.  °Do not drive a car for 6 weeks or until released by your surgeon.  °Do not drive while taking narcotics.  °Wear elastic stockings for two weeks following surgery during the day but you may remove then at night.  °Make sure you keep all of your appointments after your operation with all of your doctors and caregivers. You should call the office at the above phone number and make an appointment for approximately two weeks after the date of your surgery. °Please pick up a stool softener and laxative for home use as long as you are requiring pain medications. °· ICE to the affected hip every three hours for 30 minutes at a time and then as needed for pain and swelling. Continue to use ice on the hip for pain and swelling from surgery. You may notice swelling that will progress down to the foot and ankle.  This is normal after surgery.  Elevate the leg when you are not up walking on it.   °It is important for you to complete the blood thinner medication as prescribed by your doctor. °· Continue to use the breathing machine which will help keep your temperature down.  It is common for your temperature to cycle up and down following surgery, especially at night when you are not up moving around and exerting yourself.  The breathing machine keeps your lungs expanded and your temperature down. ° °RANGE OF MOTION AND STRENGTHENING EXERCISES  °These exercises are   designed to help you keep full movement of your hip joint. Follow your caregiver's or physical therapist's instructions. Perform all exercises about fifteen times, three times per day or as directed. Exercise both hips, even if you have had only one joint replacement. These exercises can be done on a training (exercise) mat, on the floor, on a table or on a bed. Use whatever works the best and is most comfortable for you. Use music or television while you are exercising so that the exercises  are a pleasant break in your day. This will make your life better with the exercises acting as a break in routine you can look forward to.  Lying on your back, slowly slide your foot toward your buttocks, raising your knee up off the floor. Then slowly slide your foot back down until your leg is straight again.  Lying on your back spread your legs as far apart as you can without causing discomfort.  Lying on your side, raise your upper leg and foot straight up from the floor as far as is comfortable. Slowly lower the leg and repeat.  Lying on your back, tighten up the muscle in the front of your thigh (quadriceps muscles). You can do this by keeping your leg straight and trying to raise your heel off the floor. This helps strengthen the largest muscle supporting your knee.  Lying on your back, tighten up the muscles of your buttocks both with the legs straight and with the knee bent at a comfortable angle while keeping your heel on the floor.   SKILLED REHAB INSTRUCTIONS: If the patient is transferred to a skilled rehab facility following release from the hospital, a list of the current medications will be sent to the facility for the patient to continue.  When discharged from the skilled rehab facility, please have the facility set up the patient's [place] prior to being released. Also, the skilled facility will be responsible for providing the patient with their medications at time of release from the facility to include their pain medication and their blood thinner medication. If the patient is still at the rehab facility at time of the two week follow up appointment, the skilled rehab facility will also need to assist the patient in arranging follow up appointment in our office and any transportation needs.  MAKE SURE YOU:  Understand these instructions.  Will watch your condition.  Will get help right away if you are not doing well or get worse.  Pick up stool softner and  laxative for home use following surgery while on pain medications. Do not remove your dressing. The dressing is waterproof--it is OK to take showers. Continue to use ice for pain and swelling after surgery. Do not use any lotions or creams on the incision until instructed by your surgeon. Total Hip Protocol.   Hip Fracture A hip fracture is a fracture of the upper part of your thigh bone (femur).  CAUSES A hip fracture is caused by a direct blow to the side of your hip. This is usually the result of a fall but can occur in other circumstances, such as an automobile accident. RISK FACTORS There is an increased risk of hip fractures in people with:  An unsteady walking pattern (gait) and those with conditions that contribute to poor balance, such as Parkinson's disease or dementia.  Osteopenia and osteoporosis.  Cancer that spreads to the leg bones.  Certain metabolic diseases. SYMPTOMS  Symptoms of hip fracture include:  Pain over the injured hip.  Inability to put weight on the leg in which the fracture occurred (although, some patients are able to walk after a hip fracture).  Toes and foot of the affected leg point outward when you lie down. DIAGNOSIS A physical exam can determine if a hip fracture is likely to have occurred. X-ray exams are needed to confirm the fracture and to look for other injuries. The X-ray exam can help to determine the type of hip fracture. Rarely, the fracture is not visible on an X-ray image and a CT scan or MRI will have to be done. TREATMENT  The treatment for a fracture is usually surgery. This means using a screw, nail, or rod to hold the bones in place.  HOME CARE INSTRUCTIONS Take all medicines as directed by your health care provider. SEEK MEDICAL CARE IF: Pain continues, even after taking pain medicine. MAKE SURE YOU:  Understand these instructions.   Will watch your condition.  Will get help right away if you are not doing well or get  worse.   This information is not intended to replace advice given to you by your health care provider. Make sure you discuss any questions you have with your health care provider.   Document Released: 01/03/2005 Document Revised: 01/08/2013 Document Reviewed: 08/15/2012 Elsevier Interactive Patient Education [employer].

## 2015-06-01 NOTE — Care Management Note (Signed)
Case Management Note  Patient Details  Name: [name] MRN: [id number] Date of Birth: [birth year]  Subjective/Objective:               S/p right total hip arthroplasty     Action/Plan: PT/OT recommending SNF, referral made to CSW, [place] working on SNF placement for short term rehab. Will continue to follow.  Expected Discharge Date:                  Expected Discharge Plan:  Skilled Nursing Facility  In-House Referral:  Clinical Social Work  Discharge planning Services  CM Consult  Post Acute Care Choice:    Choice offered to:     DME Arranged:    DME Agency:     HH Arranged:    [place] Agency:     Status of Service:  In process, will continue to follow  Medicare Important Message Given:    Date Medicare IM Given:    Medicare IM give by:    Date Additional Medicare IM Given:    Additional Medicare Important Message give by:     If discussed at Center of Stay Meetings, dates discussed:    Additional Comments:  Nila Nephew, RN 06/01/2015, 12:29 PM

## 2015-06-01 NOTE — Clinical Social Work Note (Signed)
Patient will discharge today per MD order. Patient will discharge to [facility] SNF RN to call report prior to transportation to: [phone]  Transportation: [place] sent discharge summary to SNF for review.    Nonnie Done, LCSW [phone]  [id number], 2S 15-16 and Psychiatric Service Line  Licensed Clinical Social Worker

## 2015-07-07 ENCOUNTER — Telehealth: Payer: Self-pay | Admitting: Hematology and Oncology

## 2015-07-07 ENCOUNTER — Ambulatory Visit (HOSPITAL_BASED_OUTPATIENT_CLINIC_OR_DEPARTMENT_OTHER): Payer: Medicare Other | Admitting: Hematology and Oncology

## 2015-07-07 ENCOUNTER — Other Ambulatory Visit (HOSPITAL_BASED_OUTPATIENT_CLINIC_OR_DEPARTMENT_OTHER): Payer: Medicare Other

## 2015-07-07 ENCOUNTER — Encounter: Payer: Self-pay | Admitting: Hematology and Oncology

## 2015-07-07 VITALS — BP 125/83 | HR 76 | Temp 97.9°F | Resp 18 | Ht 66.0 in | Wt 130.9 lb

## 2015-07-07 DIAGNOSIS — Z85038 Personal history of other malignant neoplasm of large intestine: Secondary | ICD-10-CM | POA: Diagnosis not present

## 2015-07-07 DIAGNOSIS — D7589 Other specified diseases of blood and blood-forming organs: Secondary | ICD-10-CM

## 2015-07-07 LAB — CBC & DIFF AND RETIC
BASO%: 0.2 % (ref 0.0–2.0)
Basophils Absolute: 0 10*3/uL (ref 0.0–0.1)
EOS ABS: 0.1 10*3/uL (ref 0.0–0.5)
EOS%: 1.7 % (ref 0.0–7.0)
HEMATOCRIT: 36.6 % (ref 34.8–46.6)
HEMOGLOBIN: 11.6 g/dL (ref 11.6–15.9)
Immature Retic Fract: 9.7 % (ref 1.60–10.00)
LYMPH%: 17 % (ref 14.0–49.7)
MCH: 33 pg (ref 25.1–34.0)
MCHC: 31.7 g/dL (ref 31.5–36.0)
MCV: 104.3 fL — AB (ref 79.5–101.0)
MONO#: 1 10*3/uL — AB (ref 0.1–0.9)
MONO%: 11.8 % (ref 0.0–14.0)
NEUT%: 69.3 % (ref 38.4–76.8)
NEUTROS ABS: 5.9 10*3/uL (ref 1.5–6.5)
Platelets: 330 10*3/uL (ref 145–400)
RBC: 3.51 10*6/uL — ABNORMAL LOW (ref 3.70–5.45)
RDW: 13.6 % (ref 11.2–14.5)
RETIC %: 1.68 % (ref 0.70–2.10)
Retic Ct Abs: 58.97 10*3/uL (ref 33.70–90.70)
WBC: 8.5 10*3/uL (ref 3.9–10.3)
lymph#: 1.4 10*3/uL (ref 0.9–3.3)

## 2015-07-07 LAB — COMPREHENSIVE METABOLIC PANEL
ALBUMIN: 3.5 g/dL (ref 3.5–5.0)
ALK PHOS: 82 U/L (ref 40–150)
ALT: 15 U/L (ref 0–55)
ANION GAP: 6 meq/L (ref 3–11)
AST: 18 U/L (ref 5–34)
BILIRUBIN TOTAL: 0.33 mg/dL (ref 0.20–1.20)
BUN: 18.1 mg/dL (ref 7.0–26.0)
CALCIUM: 9.6 mg/dL (ref 8.4–10.4)
CO2: 27 mEq/L (ref 22–29)
Chloride: 104 mEq/L (ref 98–109)
Creatinine: 1 mg/dL (ref 0.6–1.1)
EGFR: 56 mL/min/{1.73_m2} — AB (ref >90)
Glucose: 96 mg/dl (ref 70–140)
Potassium: 5.1 mEq/L (ref 3.5–5.1)
Sodium: 137 mEq/L (ref 136–145)
TOTAL PROTEIN: 7.4 g/dL (ref 6.4–8.3)

## 2015-07-07 LAB — LACTATE DEHYDROGENASE: LDH: 150 U/L (ref 125–245)

## 2015-07-07 NOTE — Assessment & Plan Note (Signed)
She has been disease free for over 12 years. The patient wants to continue followup here and now see her once a year with history, physical examination and blood work. She will continue close follow-up with GI for surveillance colonoscopy as indicated. 

## 2015-07-07 NOTE — Progress Notes (Signed)
[facility] OFF[facility] PROGRESS NOTE  Patient Care Team: Marjean Donna, MD as PCP - General (Family Medicine) Clarene Essex, MD as Consulting Physician (Gastroenterology) Herminio Commons, MD as Attending Physician (Cardiology)  SUMMARY OF ONCOLOGIC HISTORY:  I reviewed the patient's records extensive and collaborated the history with the patient. Summary of her history is as follows: She was diagnosed with stage IIIB pT3 N1 M0 adenocarcinoma of the right colon after presentation was of the anemia. She had resection in September 2003.  She received adjuvant chemotherapy with 5-FU, irinotecan, leucovorin which finished in April 2004 by Dr. Burney Gauze.  Last surveillance colonoscopy with Dr. Watt Climes was reportedly negative in September 2012.   INTERVAL HISTORY: Please see below for problem oriented charting. She had recent surgery due to hip fracture. She was anemic at the time. She felt that she has fully recovered from treatment. Denies recent bowel habit changes. Denies recent weight loss. The patient denies any recent signs or symptoms of bleeding such as spontaneous epistaxis, hematuria or hematochezia.  REVIEW OF SYSTEMS:   Constitutional: Denies fevers, chills or abnormal weight loss Eyes: Denies blurriness of vision Ears, nose, mouth, throat, and face: Denies mucositis or sore throat Respiratory: Denies cough, dyspnea or wheezes Cardiovascular: Denies palpitation, chest discomfort or lower extremity swelling Gastrointestinal:  Denies nausea, heartburn or change in bowel habits Skin: Denies abnormal skin rashes Lymphatics: Denies new lymphadenopathy or easy bruising Neurological:Denies numbness, tingling or new weaknesses Behavioral/Psych: Mood is stable, no new changes  All other systems were reviewed with the patient and are negative.  I have reviewed the past medical history, past surgical history, social history and family history with the patient and they are  unchanged from previous note.  ALLERGIES:  is allergic to tape; latex; lidocaine; and sulfur.  MEDICATIONS:  Current Outpatient Prescriptions  Medication Sig Dispense Refill  . acetaminophen (TYLENOL) 500 MG tablet Take 1,000 mg by mouth 2 (two) times daily.     Marland Kitchen aspirin EC 81 MG EC tablet Take 1 tablet (81 mg total) by mouth 2 (two) times daily after a meal. 60 tablet 1  . Calcium-Vitamin D (CALTRATE 600 PLUS-VIT D PO) Take 1 tablet by mouth 2 (two) times daily.     Marland Kitchen docusate sodium (COLACE) 100 MG capsule Take 100 mg by mouth 3 (three) times daily as needed for constipation.    Marland Kitchen HYDROcodone-acetaminophen (NORCO/VICODIN) 5-325 MG tablet Take 1-2 tablets by mouth every 4 (four) hours as needed for moderate pain. 60 tablet 0  . lactobacillus acidophilus (BACID) TABS tablet Take 1 tablet by mouth 2 (two) times daily.    Marland Kitchen levothyroxine (SYNTHROID, LEVOTHROID) 200 MCG tablet Take 200 mcg by mouth daily before breakfast.    . Multiple Vitamins-Minerals (CENTRUM SILVER ULTRA WOMENS PO) Take by mouth.    . Multiple Vitamins-Minerals (PRESERVISION AREDS PO) Take 2 capsules by mouth daily.    . nebivolol (BYSTOLIC) 10 MG tablet Take 10 mg by mouth daily.    . nortriptyline (PAMELOR) 25 MG capsule Take 75 mg by mouth every morning.     Marland Kitchen omeprazole (PRILOSEC) 20 MG capsule Take 20 mg by mouth 2 (two) times daily.    . simvastatin (ZOCOR) 20 MG tablet Take 20 mg by mouth at bedtime.     . vitamin B-12 (CYANOCOBALAMIN) 1000 MCG tablet Take 1,000 mcg by mouth daily.    . vitamin C (ASCORBIC ACID) 500 MG tablet Take 500 mg by mouth daily.     Marland Kitchen  Vitamin D, Ergocalciferol, (DRISDOL) 50000 UNITS CAPS capsule Take 1 capsule (50,000 Units total) by mouth every 14 (fourteen) days. 26 capsule 1  . vitamin E 400 UNIT capsule Take 400 Units by mouth 2 (two) times daily.     Marland Kitchen zolpidem (AMBIEN) 10 MG tablet Take 1 tablet (10 mg total) by mouth at bedtime. 30 tablet 0   No current facility-administered  medications for this visit.    PHYSICAL EXAMINATION: ECOG PERFORMANCE STATUS: 0 - Asymptomatic  Filed Vitals:   07/07/15 1053  BP: 125/83  Pulse: 76  Temp: 97.9 F (36.6 C)  Resp: 18   Filed Weights   07/07/15 1053  Weight: 130 lb 14.4 oz (59.376 kg)    GENERAL:alert, no distress and comfortable.  SKIN: skin color, texture, turgor are normal, no rashes or significant lesions EYES: normal, Conjunctiva are pink and non-injected, sclera clear OROPHARYNX:no exudate, no erythema and lips, buccal mucosa, and tongue normal  NECK: supple, thyroid normal size, non-tender, without nodularity LYMPH:  no palpable lymphadenopathy in the cervical, axillary or inguinal LUNGS: clear to auscultation and percussion with normal breathing effort HEART: regular rate & rhythm and no murmurs and no lower extremity edema ABDOMEN:abdomen soft, non-tender and normal bowel sounds Musculoskeletal:no cyanosis of digits and no clubbing  NEURO: alert & oriented x 3 with fluent speech, no focal motor/sensory deficits  LABORATORY DATA:  I have reviewed the data as listed    Component Value Date/Time   NA 137 07/07/2015 1040   NA 134* 06/01/2015 0659   NA 146* 06/29/2011 1028   K 5.1 07/07/2015 1040   K 3.7 06/01/2015 0659   K 5.1* 06/29/2011 1028   CL 102 06/01/2015 0659   CL 101 07/02/2012 0935   CL 100 06/29/2011 1028   CO2 27 07/07/2015 1040   CO2 26 06/01/2015 0659   CO2 32 06/29/2011 1028   GLUCOSE 96 07/07/2015 1040   GLUCOSE 135* 06/01/2015 0659   GLUCOSE 137* 07/02/2012 0935   GLUCOSE 136* 06/29/2011 1028   BUN 18.1 07/07/2015 1040   BUN 10 06/01/2015 0659   BUN 18 06/29/2011 1028   CREATININE 1.0 07/07/2015 1040   CREATININE 0.76 06/01/2015 0659   CREATININE 1.1 06/29/2011 1028   CALCIUM 9.6 07/07/2015 1040   CALCIUM 8.2* 06/01/2015 0659   CALCIUM 9.1 06/29/2011 1028   PROT 7.4 07/07/2015 1040   PROT 6.7 05/30/2015 1258   PROT 8.0 06/29/2011 1028   ALBUMIN 3.5 07/07/2015 1040    ALBUMIN 3.7 05/30/2015 1555   ALBUMIN 3.9 06/29/2011 1028   AST 18 07/07/2015 1040   AST 33 05/30/2015 1258   AST 29 06/29/2011 1028   ALT 15 07/07/2015 1040   ALT 25 05/30/2015 1258   ALT 24 06/29/2011 1028   ALKPHOS 82 07/07/2015 1040   ALKPHOS 58 05/30/2015 1258   ALKPHOS 62 06/29/2011 1028   BILITOT 0.33 07/07/2015 1040   BILITOT 0.5 05/30/2015 1258   BILITOT 0.70 06/29/2011 1028   GFRNONAA >60 06/01/2015 0659   GFRAA >60 06/01/2015 0659    No results found for: SPEP, UPEP  Lab Results  Component Value Date   WBC 8.5 07/07/2015   NEUTROABS 5.9 07/07/2015   HGB 11.6 07/07/2015   HCT 36.6 07/07/2015   MCV 104.3* 07/07/2015   PLT 330 07/07/2015      Chemistry      Component Value Date/Time   NA 137 07/07/2015 1040   NA 134* 06/01/2015 0659   NA 146* 06/29/2011 1028  K 5.1 07/07/2015 1040   K 3.7 06/01/2015 0659   K 5.1* 06/29/2011 1028   CL 102 06/01/2015 0659   CL 101 07/02/2012 0935   CL 100 06/29/2011 1028   CO2 27 07/07/2015 1040   CO2 26 06/01/2015 0659   CO2 32 06/29/2011 1028   BUN 18.1 07/07/2015 1040   BUN 10 06/01/2015 0659   BUN 18 06/29/2011 1028   CREATININE 1.0 07/07/2015 1040   CREATININE 0.76 06/01/2015 0659   CREATININE 1.1 06/29/2011 1028      Component Value Date/Time   CALCIUM 9.6 07/07/2015 1040   CALCIUM 8.2* 06/01/2015 0659   CALCIUM 9.1 06/29/2011 1028   ALKPHOS 82 07/07/2015 1040   ALKPHOS 58 05/30/2015 1258   ALKPHOS 62 06/29/2011 1028   AST 18 07/07/2015 1040   AST 33 05/30/2015 1258   AST 29 06/29/2011 1028   ALT 15 07/07/2015 1040   ALT 25 05/30/2015 1258   ALT 24 06/29/2011 1028   BILITOT 0.33 07/07/2015 1040   BILITOT 0.5 05/30/2015 1258   BILITOT 0.70 06/29/2011 1028     ASSESSMENT & PLAN:  History of colon cancer, stage III She has been disease free for over 12 years. The patient wants to continue followup here and now see her once a year with history, physical examination and blood work. She will continue  close follow-up with GI for surveillance colonoscopy as indicated.  Macrocytosis without anemia I suspect this is related to recent changes in her thyroid medications. Serum vitamin B12, hepatic panel and TSH were within normal limits.  There is no indication to proceed with bone marrow biopsy or further investigation at this point as she is not symptomatic.   Orders Placed This Encounter  Procedures  . CBC & Diff and Retic    Standing Status: Future     Number of Occurrences:      Standing Expiration Date: 08/10/2016  . CEA    Standing Status: Future     Number of Occurrences:      Standing Expiration Date: 08/10/2016   All questions were answered. The patient knows to call the clinic with any problems, questions or concerns. No barriers to learning was detected. I spent 15 minutes counseling the patient face to face. The total time spent in the appointment was 20 minutes and more than 50% was on counseling and review of test results     [facility], Rahshawn Remo, MD 07/07/2015 1:13 PM

## 2015-07-07 NOTE — Assessment & Plan Note (Signed)
I suspect this is related to recent changes in her thyroid medications. Serum vitamin B12, hepatic panel and TSH were within normal limits.  There is no indication to proceed with bone marrow biopsy or further investigation at this point as she is not symptomatic.

## 2015-07-07 NOTE — Telephone Encounter (Signed)
per of to sch pt appt-gave pt copy of avs °

## 2015-07-08 LAB — CEA (PARALLEL TESTING): CEA: 2.1 ng/mL

## 2015-08-15 ENCOUNTER — Other Ambulatory Visit: Payer: Self-pay | Admitting: Nurse Practitioner

## 2015-08-17 NOTE — Telephone Encounter (Signed)
Medication refill request: Vitamin D Last AEX:  08/13/14 PG Next AEX: 08/19/15 PG Last MMG (if hormonal medication request): 12/10/14 [id number] Refill authorized: 08/12/14 #[id number] 1R. Please advise. Thank you.

## 2015-08-19 ENCOUNTER — Encounter: Payer: Self-pay | Admitting: Nurse Practitioner

## 2015-08-19 ENCOUNTER — Ambulatory Visit (INDEPENDENT_AMBULATORY_CARE_PROVIDER_SITE_OTHER): Payer: Medicare Other | Admitting: Nurse Practitioner

## 2015-08-19 VITALS — BP 128/66 | HR 60 | Ht 66.0 in | Wt 133.0 lb

## 2015-08-19 DIAGNOSIS — E039 Hypothyroidism, unspecified: Secondary | ICD-10-CM

## 2015-08-19 DIAGNOSIS — E559 Vitamin D deficiency, unspecified: Secondary | ICD-10-CM

## 2015-08-19 DIAGNOSIS — I10 Essential (primary) hypertension: Secondary | ICD-10-CM

## 2015-08-19 DIAGNOSIS — Z01419 Encounter for gynecological examination (general) (routine) without abnormal findings: Secondary | ICD-10-CM | POA: Diagnosis not present

## 2015-08-19 MED ORDER — VITAMIN D (ERGOCALCIFEROL) 1.25 MG (50000 UNIT) PO CAPS: ORAL_CAPSULE | ORAL | 3 refills | Status: AC

## 2015-08-19 NOTE — Patient Instructions (Signed)

## 2015-08-19 NOTE — Progress Notes (Signed)
Patient ID: [name], female   DOB: [birth year], 82 y.o.   MRN: [id number]  82 y.o. G1P1001 Single Caucasian Fe here for annual exam.  She has come off HRT and doing well.  Had a recent fall and fracture of right hip and femur with replacement and ORIF 05/30/2015.  She went to a nursing home and did well with mobility and is now back home. She is using a walker for being out of the house but inside does not need.  She is doing well otherwise.  Son still lives with her and he does a good job at caring for her.  Patient's last menstrual period was 01/17/1994.          Sexually active: No.  The current method of family planning is none.    Exercising: Yes.    walking, working up to 40 minutes 6 days per week following hip fracture Smoker:  no  Health Maintenance: Pap: 07/13/2009 negative  MMG: 12/10/14, Bi-Rads 1:  Negative  Colonoscopy: 10/11/10, polyp, repeat in 5 years BMD: 09/2011 T Score: Spine -0.5; left femur neck -2.8; total -2.3 completed 2 years of Forteo in June 2015. Will get a repeat BMD in September 2016 per pt. TDaP: PCP maintains tetanus  Shingles: Never - secondary to cost Pneumonia: completed series of 2, does not think she has had Prevnar 13 Hep C and HIV: Not indicated due to age Labs: multiple labs in EPIC following hip fracture and oncology follow up; Vit D = 29, 05/30/15   reports that she has never smoked. She has never used smokeless tobacco. She reports that she does not drink alcohol or use drugs.  Past Medical History:  Diagnosis Date  . Abnormal uterine bleeding (AUB)    on cont HRT  . Anemia    hx of in 2003   . Arthritis   . Blood transfusion without reported diagnosis   . Calcification of aorta (HCC) 11/16/2012  . Choledocholithiasis with obstruction 11/16/2012  . Colon cancer [facility])    s/p resecton of colon in 2003 , hx of chemo  . Colon cancer ([place])   . Compression fracture jan 2012   due to fall  . Depression   . DJD (degenerative joint  disease)   . DM II (diabetes mellitus, type II), controlled 11/16/2012  . Fibromyalgia   . GERD (gastroesophageal reflux disease)   . Goiter   . Headache(784.0)    tension headache daily per pt   . Headaches, cluster   . Heart murmur   . Hiatal hernia   . Hypertension   . Hypothyroid   . Macular degeneration   . Osteoarthritis   . Pancreatitis, acute 11/15/2012  . Peripheral vascular disease ([place])    varicose veins in left arm   . PONV (postoperative nausea and vomiting)   . Shortness of breath    with exertion   . TMJ (dislocation of temporomandibular joint)     Past Surgical History:  Procedure Laterality Date  . APPENDECTOMY    . BACK SURGERY  2000   lower  . CHOLECYSTECTOMY N/A 11/17/2012   Procedure: LAPAROSCOPIC CHOLECYSTECTOMY;  Surgeon: Leighton Ruff, MD;  Location: [facility];  Service: General;  Laterality: N/A;  . COLON RESECTION  2003  . COLONOSCOPY W/ BIOPSIES  09/25/2002, 09/18/2001  . DILATION AND CURETTAGE OF UTERUS    . ERCP N/A 11/14/2012   Procedure: ENDOSCOPIC RETROGRADE CHOLANGIOPANCREATOGRAPHY (ERCP);  Surgeon: Jeryl Columbia, MD;  Location: [facility];  Service:  Endoscopy;  Laterality: N/A;  . ESOPHAGOGASTRODUODENOSCOPY ENDOSCOPY    . EYE SURGERY     bilateral cataract surgery   . HYSTEROSCOPY    . JOINT REPLACEMENT Right dec 2012   knee  . porta catheter placement  10/30/2001  . porta catheter removal  06/27/2003  . TMJ ARTHROPLASTY  1990's  . TMJ ARTHROPLASTY    . TONSILLECTOMY  age 60 or 66  . TOTAL HIP ARTHROPLASTY Right 05/30/2015   Procedure: TOTAL HIP ARTHROPLASTY ANTERIOR APPROACH;  Surgeon: Rod Can, MD;  Location: [facility];  Service: Orthopedics;  Laterality: Right;  . TOTAL KNEE ARTHROPLASTY  12/20/2010   Procedure: TOTAL KNEE ARTHROPLASTY;  Surgeon: Mauri Pole;  Location: [facility];  Service: Orthopedics;  Laterality: Right;  . UPPER GASTROINTESTINAL ENDOSCOPY  09/18/2001  . VERTEBROPLASTY  2012    Current Outpatient Prescriptions   Medication Sig Dispense Refill  . acetaminophen (TYLENOL) 500 MG tablet Take 1,000 mg by mouth 2 (two) times daily.     Marland Kitchen aspirin EC 81 MG EC tablet Take 1 tablet (81 mg total) by mouth 2 (two) times daily after a meal. 60 tablet 1  . Calcium-Vitamin D (CALTRATE 600 PLUS-VIT D PO) Take 1 tablet by mouth 2 (two) times daily.     Marland Kitchen docusate sodium (COLACE) 100 MG capsule Take 100 mg by mouth 3 (three) times daily as needed for constipation.    Marland Kitchen HYDROcodone-acetaminophen (NORCO/VICODIN) 5-325 MG tablet Take 1-2 tablets by mouth every 4 (four) hours as needed for moderate pain. 60 tablet 0  . lactobacillus acidophilus (BACID) TABS tablet Take 1 tablet by mouth 2 (two) times daily.    Marland Kitchen levothyroxine (SYNTHROID, LEVOTHROID) 200 MCG tablet Take 200 mcg by mouth daily before breakfast.    . Multiple Vitamins-Minerals (CENTRUM SILVER ULTRA WOMENS PO) Take by mouth.    . Multiple Vitamins-Minerals (PRESERVISION AREDS PO) Take 2 capsules by mouth daily.    . nebivolol (BYSTOLIC) 10 MG tablet Take 10 mg by mouth daily.    . nortriptyline (PAMELOR) 25 MG capsule Take 75 mg by mouth every morning.     Marland Kitchen omeprazole (PRILOSEC) 20 MG capsule Take 20 mg by mouth 2 (two) times daily.    . simvastatin (ZOCOR) 20 MG tablet Take 20 mg by mouth at bedtime.     . vitamin B-12 (CYANOCOBALAMIN) 1000 MCG tablet Take 1,000 mcg by mouth daily.    . vitamin C (ASCORBIC ACID) 500 MG tablet Take 500 mg by mouth daily.     . Vitamin D, Ergocalciferol, (DRISDOL) 50000 units CAPS capsule take ONE CAPSULE BY MOUTH every 14 DAYS 30 capsule 0  . vitamin E 400 UNIT capsule Take 400 Units by mouth 2 (two) times daily.     Marland Kitchen zolpidem (AMBIEN) 10 MG tablet Take 1 tablet (10 mg total) by mouth at bedtime. 30 tablet 0   No current facility-administered medications for this visit.     Family History  Problem Relation Age of Onset  . Heart disease Mother     Enlarged heart  . Arrhythmia Mother   . Diabetes Mother   . Stroke  Sister   . Heart disease Brother     MI at unknown age  . Anesthesia problems Brother   . Heart attack      MI at age 42  . Heart disease Brother     CABG  . Heart disease Father   . Cancer Son     non-hodgkins lymphoma  ROS:  Pertinent items are noted in HPI.  Otherwise, a comprehensive ROS was negative.  Exam:   LMP 01/17/1994    Ht Readings from Last 3 Encounters:  07/07/15 5\' 6"  (1.676 m)  05/30/15 5\' 6"  (1.676 m)  08/13/14 5' 5.75" (1.67 m)    General appearance: alert, cooperative and appears stated age Head: Normocephalic, without obvious abnormality, atraumatic Neck: no adenopathy, supple, symmetrical, trachea midline and thyroid normal to inspection and palpation Lungs: clear to auscultation bilaterally Breasts: normal appearance, no masses or tenderness Heart: regular rate and rhythm Abdomen: soft, non-tender; no masses,  no organomegaly Extremities: extremities normal, atraumatic, no cyanosis or edema Skin: Skin color, texture, turgor normal. No rashes or lesions Lymph nodes: Cervical, supraclavicular, and axillary nodes normal. No abnormal inguinal nodes palpated Neurologic: Grossly normal   Pelvic: External genitalia:  no lesions              Urethra:  normal appearing urethra with no masses, tenderness or lesions              Bartholin's and Skene's: normal                 Vagina: normal appearing vagina with normal color and discharge, no lesions              Cervix: anteverted              Pap taken: No. Bimanual Exam:  Uterus:  normal size, contour, position, consistency, mobility, non-tender              Adnexa: no mass, fullness, tenderness               Rectovaginal: Confirms               Anus:  normal sphincter tone, no lesions  Chaperone present: yes  A:  Well Woman with normal exam  Postmenopausal off HRT~  fall 2016 History of osteoporosis off Forteo X 3 years  S/P colon cancer 2003 S/P lap Chole  11/2012  S/P right hip replacement and ORIF to right femur secondary to fall 05/30/15             History of Vit D deficiency   P:   Reviewed health and wellness pertinent to exam  Pap smear as above  Mammogram is due 11/2015  Will check Vit D and follow, refilled med's  Counseled on breast self exam, mammography screening, osteoporosis, adequate intake of calcium and vitamin D, diet and exercise, Kegel's exercises return annually or prn  Call pt with BMD results when we get a copy - she does not know results  An After Visit Summary was printed and given to the patient.

## 2015-08-20 LAB — VITAMIN D 25 HYDROXY (VIT D DEFICIENCY, FRACTURES): Vit D, 25-Hydroxy: 42 ng/mL (ref 30–100)

## 2015-08-20 NOTE — Progress Notes (Signed)
Encounter reviewed Jill Jertson, MD   

## 2015-09-15 ENCOUNTER — Other Ambulatory Visit: Payer: Self-pay | Admitting: Endocrinology

## 2015-09-15 DIAGNOSIS — E049 Nontoxic goiter, unspecified: Secondary | ICD-10-CM

## 2015-10-05 ENCOUNTER — Telehealth: Payer: Self-pay | Admitting: Nurse Practitioner

## 2015-10-05 NOTE — Telephone Encounter (Signed)
Spoke with patient. Patient states that she since coming off her estrogen and progesterone in fall 2016 she has been having 5-6 hot flashes per day. Reports she has tried OTC medications without relief. Patient is unable to provide names of medications she has tried. "I took it for 4 months without much difference." Patient is requesting that Kem Boroughs, FNP restart her HRT. Advised patient due to her age and risk of stroke with HRT unable to restart HRT. Offered patient an appointment to be seen with Kem Boroughs, FNP to discuss, but patient declines. "I was just seen there recently." Advised patient hot flashes can be triggered by other health conditions and labs may be needed. Patient requests that I speak with Kem Boroughs, FNP regarding recommendations and return call.

## 2015-10-05 NOTE — Telephone Encounter (Signed)
She is at a great risk for DVT, CVA just with her recent fall and hip fracture.  Agree that she should not restart HRT.  But other health issues maybe the cause of vaso symptoms.  Since she is postmenopausal not from ovarian function just now stopping.  She could have problems with other hormones such as thyroid and glucose. She needs to see PCP for another TSH and blood sugar.  The other cause of increase of vaso symptoms could be medication - her pain med's even though she may not take them a lot -could cause her symptoms.

## 2015-10-05 NOTE — Telephone Encounter (Signed)
Patient called requesting to speak with the nurse about "hot flashes."

## 2015-10-06 NOTE — Telephone Encounter (Signed)
Spoke with patient. Advised of message as seen below from [name], [place]. Patient states she has an appointment to see her PCP in October. Advised due to symptoms she is experiencing it is our recommendation that she be seen earlier for evaluation. Patient is agreeable and will contact her PCP.  Routing to provider for final review. Patient agreeable to disposition. Will close encounter.

## 2015-11-09 ENCOUNTER — Other Ambulatory Visit: Payer: Self-pay | Admitting: Family Medicine

## 2015-11-09 DIAGNOSIS — Z1231 Encounter for screening mammogram for malignant neoplasm of breast: Secondary | ICD-10-CM

## 2015-12-14 ENCOUNTER — Ambulatory Visit
Admission: RE | Admit: 2015-12-14 | Discharge: 2015-12-14 | Disposition: A | Discharge status: Home / Self Care | Payer: Medicare Other | Source: Ambulatory Visit | Attending: Family Medicine | Admitting: Family Medicine

## 2015-12-14 DIAGNOSIS — Z1231 Encounter for screening mammogram for malignant neoplasm of breast: Secondary | ICD-10-CM

## 2015-12-23 ENCOUNTER — Ambulatory Visit
Admission: RE | Admit: 2015-12-23 | Discharge: 2015-12-23 | Disposition: A | Discharge status: Home / Self Care | Payer: Medicare Other | Source: Ambulatory Visit | Attending: Endocrinology | Admitting: Endocrinology

## 2015-12-23 DIAGNOSIS — E049 Nontoxic goiter, unspecified: Secondary | ICD-10-CM

## 2016-03-16 ENCOUNTER — Ambulatory Visit: Payer: Medicare Other | Attending: Orthopedic Surgery | Admitting: Physical Therapy

## 2016-03-16 ENCOUNTER — Encounter: Payer: Self-pay | Admitting: Physical Therapy

## 2016-03-16 DIAGNOSIS — M25551 Pain in right hip: Secondary | ICD-10-CM

## 2016-03-16 DIAGNOSIS — M6281 Muscle weakness (generalized): Secondary | ICD-10-CM

## 2016-03-16 NOTE — Therapy (Signed)
[facility] [place], [place], [address] Phone: [phone]   Fax:  [phone]  Physical Therapy Evaluation  Patient Details  Name: [name] MRN: [id number] Date of Birth: 24-Jun-1932 Referring Provider: Rod Can MD.  Encounter Date: 03/16/2016      PT End of Session - 03/16/16 1711    Visit Number 1   Number of Visits 12   Date for PT Re-Evaluation 05/15/16   PT Start Time 0947   PT Stop Time 1034   PT Time Calculation (min) 47 min   Activity Tolerance Patient tolerated treatment well   Behavior During Therapy [facility] for tasks assessed/performed      Past Medical History:  Diagnosis Date  . Abnormal uterine bleeding (AUB)    on cont HRT  . Anemia    hx of in 2003   . Arthritis   . Blood transfusion without reported diagnosis   . Calcification of aorta (HCC) 11/16/2012  . Choledocholithiasis with obstruction 11/16/2012  . Colon cancer [facility]    s/p resecton of colon in 2003 , hx of chemo  . Colon cancer ([place])   . Compression fracture jan 2012   due to fall  . Depression   . DJD (degenerative joint disease)   . DM II (diabetes mellitus, type II), controlled 11/16/2012  . Fibromyalgia   . GERD (gastroesophageal reflux disease)   . Goiter   . Headache(784.0)    tension headache daily per pt   . Headaches, cluster   . Heart murmur   . Hiatal hernia   . Hypertension   . Hypothyroid   . Macular degeneration   . Osteoarthritis   . Pancreatitis, acute 11/15/2012  . Peripheral vascular disease ([place])    varicose veins in left arm   . PONV (postoperative nausea and vomiting)   . Shortness of breath    with exertion   . TMJ (dislocation of temporomandibular joint)     Past Surgical History:  Procedure Laterality Date  . APPENDECTOMY    . BACK SURGERY  2000   lower  . CHOLECYSTECTOMY N/A 11/17/2012   Procedure: LAPAROSCOPIC CHOLECYSTECTOMY;  Surgeon: Leighton Ruff, MD;  [facility];   Service: General;  Laterality: N/A;  . COLON RESECTION  2003  . COLONOSCOPY W/ BIOPSIES  09/25/2002, 09/18/2001  . DILATION AND CURETTAGE OF UTERUS    . ERCP N/A 11/14/2012   Procedure: ENDOSCOPIC RETROGRADE CHOLANGIOPANCREATOGRAPHY (ERCP);  Surgeon: Jeryl Columbia, MD;  Location[facility];  Service: Endoscopy;  Laterality: N/A;  . ESOPHAGOGASTRODUODENOSCOPY ENDOSCOPY    . EYE SURGERY     bilateral cataract surgery   . HYSTEROSCOPY    . JOINT REPLACEMENT Right dec 2012   knee  . porta catheter placement  10/30/2001  . porta catheter removal  06/27/2003  . TMJ ARTHROPLASTY  1990's  . TMJ ARTHROPLASTY    . TONSILLECTOMY  age 39 or 16  . TOTAL HIP ARTHROPLASTY Right 05/30/2015   Procedure: TOTAL HIP ARTHROPLASTY ANTERIOR APPROACH;  Surgeon: Rod Can, MD;  Location: [place];  Service: Orthopedics;  Laterality: Right;  . TOTAL KNEE ARTHROPLASTY  12/20/2010   Procedure: TOTAL KNEE ARTHROPLASTY;  Surgeon: Mauri Pole;  [facility];  Service: Orthopedics;  Laterality: Right;  . UPPER GASTROINTESTINAL ENDOSCOPY  09/18/2001  . VERTEBROPLASTY  2012    There were no vitals filed for this visit.       Subjective Assessment - 03/16/16 1737    Subjective The  patient underwent a right total hip replacement in may of 2017.  She sttaes she has done well but in Mid-February (2018) she began to experience right hip pain rated at 9-10/10.  She received an injection recently and it helped a great deal.  Her pain is now reduced to a 4/10.  Walking has increased her pain.     Pertinent History Back and right knee surgery.   Limitations Walking   How long can you walk comfortably? Short distances.   Patient Stated Goals "I want to start back to walking."   Currently in Pain? Yes   Pain Score 4    Pain Location Hip   Pain Orientation Right   Pain Descriptors / Indicators Aching   Pain Type Acute pain   Pain Onset 1 to 4 weeks ago   Pain Frequency Constant   Aggravating Factors  See above.    Pain Relieving Factors See above.            [facility] PT Assessment - 03/16/16 0001      Assessment   Medical Diagnosis Right hip trochanteric bursitis.   Referring Provider Rod Can MD.   Onset Date/Surgical Date --  05/29/16.     Precautions   Precautions --  Right anterior total hip replacement.   Precaution Comments --  No ultrasound.     Restrictions   Weight Bearing Restrictions No     Balance Screen   Has the patient fallen in the past 6 months No   Has the patient had a decrease in activity level because of a fear of falling?  No   Is the patient reluctant to leave their home because of a fear of falling?  No     Home Ecologist residence     Prior Function   Level of Independence Independent     Posture/Postural Control   Posture/Postural Control Postural limitations   Postural Limitations Decreased lumbar lordosis     ROM / Strength   AROM / PROM / Strength AROM;Strength     AROM   Overall AROM Comments --  Right hip ROM is WFL.     Strength   Overall Strength Comments Right hip abuction= 4/5.     Palpation   Palpation comment Tender around and posterior to her right greater trochanter.  She states her hip is not nearly as tender as it was prior to the injection.     Ambulation/Gait   Gait Comments Safe ambulation with decreased step/stride length.                   [facility] Adult PT Treatment/Exercise - 03/16/16 0001      Modalities   Modalities Electrical Stimulation;Moist Heat     Moist Heat Therapy   Number Minutes Moist Heat 15 Minutes   Moist Heat Location --  RT HIP.     Acupuncturist Location --  RT HIP.   Electrical Stimulation Action IFC   Electrical Stimulation Parameters 80-150 HZ x 15 minutes.   Electrical Stimulation Goals Pain                  PT Short Term Goals - 03/16/16 1800      PT SHORT TERM GOAL #1   Title STG's=LTG's.            PT Long Term Goals - 03/16/16 1800      PT LONG TERM GOAL #1  Title Independent with a HEP.   Time 8   Period Weeks   Status New     PT LONG TERM GOAL #2   Title Right hip abduction= 5/5 to increase stability for gait and functional activites.   Time 8   Period Weeks   Status New     PT LONG TERM GOAL #3   Title Patient walk her normal exercise distance with right hip pain not > 2-3/10.   Time 8   Period Weeks   Status New               Plan - 03/16/16 1752    Clinical Impression Statement The patient c/o right hip pain that was severe prior to a recent injection.  her hip is tender around and posterior to her right greater trochanter.  She has a minimal loss of right hip abduction strength.  She enjoys walking for exercise and her hip pain is prohibiting her from doing so.  Patient will benefit from skilled physical therapy to meet goals.   Rehab Potential Excellent   PT Frequency 3x / week   PT Duration 4 weeks   PT Treatment/Interventions ADLs/Self Care Home Management;Cryotherapy;Electrical Stimulation;Moist Heat;Patient/family education;Therapeutic exercise;Therapeutic activities;Manual techniques;Scar mobilization   PT Next Visit Plan STW/M to right hip; electrical stimulation; Nustep; right hip abduction strengthening.   Consulted and Agree with Plan of Care Patient      Patient will benefit from skilled therapeutic intervention in order to improve the following deficits and impairments:  Decreased activity tolerance, Decreased strength, Pain  Visit Diagnosis: Pain in right hip - Plan: PT plan of care cert/re-cert  Muscle weakness (generalized) - Plan: PT plan of care cert/re-cert      G-Codes - [id number] 1802    Functional Assessment Tool Used (Outpatient Only) FOTO....57% limitation.   Functional Limitation Mobility: Walking and moving around   Mobility: Walking and Moving Around Current Status [phone]) At least 40 percent but less than 60  percent impaired, limited or restricted   Mobility: Walking and Moving Around Goal Status [phone]) At least 20 percent but less than 40 percent impaired, limited or restricted       Problem List Patient Active Problem List   Diagnosis Date Noted  . Vitamin D deficiency 08/19/2015  . Closed right hip fracture ([place]) 05/30/2015  . Chest pain syndrome 05/30/2015  . Fall at home 05/30/2015  . Closed right femoral fracture ([place]) 05/30/2015  . Displaced fracture of right femoral neck ([place]) 05/30/2015  . Fall   . Cough 07/02/2013  . Other dyspnea and respiratory abnormality 07/02/2013  . History of colon cancer, stage III 07/02/2013  . Macrocytosis without anemia 07/02/2013  . Chronic cholecystitis with calculus s/p lap chole 11/17/2012 11/19/2012  . GERD (gastroesophageal reflux disease) 11/16/2012  . Choledocholithiasis with obstruction 11/16/2012  . Calcification of aorta (HCC) 11/16/2012  . Atherosclerosis 11/16/2012  . Hypothyroidism 11/15/2012  . Nonspecific (abnormal) findings on radiological and other examination of gastrointestinal tract 11/08/2012  . S/P right knee replacement 12/22/2010  . Preop cardiovascular exam 12/08/2010  . Essential hypertension 12/08/2010  . Hyperlipidemia 12/08/2010    Atasha Colebank, [place] MPT 03/16/2016, 6:06 PM  [facility] [address] [place], [place], [address] Phone: [phone]   Fax:  [phone]  Name: [name] MRN: [id number] Date of Birth: [birth year]

## 2016-03-17 ENCOUNTER — Ambulatory Visit: Payer: Medicare Other | Attending: Orthopedic Surgery | Admitting: Physical Therapy

## 2016-03-17 DIAGNOSIS — M6281 Muscle weakness (generalized): Secondary | ICD-10-CM | POA: Insufficient documentation

## 2016-03-17 DIAGNOSIS — M25551 Pain in right hip: Secondary | ICD-10-CM | POA: Diagnosis not present

## 2016-03-17 DIAGNOSIS — R262 Difficulty in walking, not elsewhere classified: Secondary | ICD-10-CM | POA: Diagnosis present

## 2016-03-17 NOTE — Therapy (Signed)
[facility] [place], [place], [address] Phone: [phone]   Fax:  [phone]  Physical Therapy Treatment  Patient Details  Name: [name] MRN: [id number] Date of Birth: 1932/10/13 Referring Provider: Rod Can MD.  Encounter Date: 03/17/2016      PT End of Session - 03/17/16 1606    Visit Number 2   Number of Visits 12   Date for PT Re-Evaluation 05/15/16   PT Start Time 0315   PT Stop Time 0407   PT Time Calculation (min) 52 min   Activity Tolerance Patient tolerated treatment well   Behavior During Therapy [facility] for tasks assessed/performed      Past Medical History:  Diagnosis Date  . Abnormal uterine bleeding (AUB)    on cont HRT  . Anemia    hx of in 2003   . Arthritis   . Blood transfusion without reported diagnosis   . Calcification of aorta (HCC) 11/16/2012  . Choledocholithiasis with obstruction 11/16/2012  . Colon cancer [facility])    s/p resecton of colon in 2003 , hx of chemo  . Colon cancer ([place])   . Compression fracture jan 2012   due to fall  . Depression   . DJD (degenerative joint disease)   . DM II (diabetes mellitus, type II), controlled 11/16/2012  . Fibromyalgia   . GERD (gastroesophageal reflux disease)   . Goiter   . Headache(784.0)    tension headache daily per pt   . Headaches, cluster   . Heart murmur   . Hiatal hernia   . Hypertension   . Hypothyroid   . Macular degeneration   . Osteoarthritis   . Pancreatitis, acute 11/15/2012  . Peripheral vascular disease ([place])    varicose veins in left arm   . PONV (postoperative nausea and vomiting)   . Shortness of breath    with exertion   . TMJ (dislocation of temporomandibular joint)     Past Surgical History:  Procedure Laterality Date  . APPENDECTOMY    . BACK SURGERY  2000   lower  . CHOLECYSTECTOMY N/A 11/17/2012   Procedure: LAPAROSCOPIC CHOLECYSTECTOMY;  Surgeon: Leighton Ruff, MD;  [facility];   Service: General;  Laterality: N/A;  . COLON RESECTION  2003  . COLONOSCOPY W/ BIOPSIES  09/25/2002, 09/18/2001  . DILATION AND CURETTAGE OF UTERUS    . ERCP N/A 11/14/2012   Procedure: ENDOSCOPIC RETROGRADE CHOLANGIOPANCREATOGRAPHY (ERCP);  Surgeon: Jeryl Columbia, MD;  Location: [facility];  Service: Endoscopy;  Laterality: N/A;  . ESOPHAGOGASTRODUODENOSCOPY ENDOSCOPY    . EYE SURGERY     bilateral cataract surgery   . HYSTEROSCOPY    . JOINT REPLACEMENT Right dec 2012   knee  . porta catheter placement  10/30/2001  . porta catheter removal  06/27/2003  . TMJ ARTHROPLASTY  1990's  . TMJ ARTHROPLASTY    . TONSILLECTOMY  age 33 or 69  . TOTAL HIP ARTHROPLASTY Right 05/30/2015   Procedure: TOTAL HIP ARTHROPLASTY ANTERIOR APPROACH;  Surgeon: Rod Can, MD;  Location: [place];  Service: Orthopedics;  Laterality: Right;  . TOTAL KNEE ARTHROPLASTY  12/20/2010   Procedure: TOTAL KNEE ARTHROPLASTY;  Surgeon: Mauri Pole;  [facility];  Service: Orthopedics;  Laterality: Right;  . UPPER GASTROINTESTINAL ENDOSCOPY  09/18/2001  . VERTEBROPLASTY  2012    There were no vitals filed for this visit.      Subjective Assessment - 03/17/16 1559    Subjective Less pain  in my hip today.   Pain Score 3    Pain Location Hip   Pain Orientation Right   Pain Descriptors / Indicators Aching   Pain Type Acute pain   Pain Onset 1 to 4 weeks ago                         [facility] Adult PT Treatment/Exercise - 03/17/16 0001      Electrical Stimulation   Electrical Stimulation Location --  Right hip.   Electrical Stimulation Action IFC   Electrical Stimulation Parameters 80-150 HZ at 100% scan x20 minutes.   Electrical Stimulation Goals Pain     Manual Therapy   Manual Therapy Soft tissue mobilization   Manual therapy comments Left sdly position with folded pillow between knees for comfort:  STW/M affected right hip region x 23 minutes.                  PT Short Term  Goals - 05-Apr-2016 1800      PT SHORT TERM GOAL #1   Title STG's=LTG's.           PT Long Term Goals - Apr 05, 2016 1800      PT LONG TERM GOAL #1   Title Independent with a HEP.   Time 8   Period Weeks   Status New     PT LONG TERM GOAL #2   Title Right hip abduction= 5/5 to increase stability for gait and functional activites.   Time 8   Period Weeks   Status New     PT LONG TERM GOAL #3   Title Patient walk her normal exercise distance with right hip pain not > 2-3/10.   Time 8   Period Weeks   Status New               Plan - April 05, 2016 1752    Clinical Impression Statement The patient c/o right hip pain that was severe prior to a recent injection.  her hip is tender around and posterior to her right greater trochanter.  She has a minimal loss of right hip abduction strength.  She enjoys walking for exercise and her hip pain is prohibiting her from doing so.  Patient will benefit from skilled physical therapy to meet goals.   Rehab Potential Excellent   PT Frequency 3x / week   PT Duration 4 weeks   PT Treatment/Interventions ADLs/Self Care Home Management;Cryotherapy;Electrical Stimulation;Moist Heat;Patient/family education;Therapeutic exercise;Therapeutic activities;Manual techniques;Scar mobilization   PT Next Visit Plan STW/M to right hip; electrical stimulation; Nustep; right hip abduction strengthening.   Consulted and Agree with Plan of Care Patient      Patient will benefit from skilled therapeutic intervention in order to improve the following deficits and impairments:  Decreased activity tolerance, Decreased strength, Pain  Visit Diagnosis: Pain in right hip  Muscle weakness (generalized)       G-Codes - Apr 05, 2016 1802    Functional Assessment Tool Used (Outpatient Only) FOTO....57% limitation.   Functional Limitation Mobility: Walking and moving around   Mobility: Walking and Moving Around Current Status [phone]) At least 40 percent but less than 60  percent impaired, limited or restricted   Mobility: Walking and Moving Around Goal Status [phone]) At least 20 percent but less than 40 percent impaired, limited or restricted      Problem List Patient Active Problem List   Diagnosis Date Noted  . Vitamin D deficiency 08/19/2015  . Closed right hip  fracture ([place]) 05/30/2015  . Chest pain syndrome 05/30/2015  . Fall at home 05/30/2015  . Closed right femoral fracture ([place]) 05/30/2015  . Displaced fracture of right femoral neck ([place]) 05/30/2015  . Fall   . Cough 07/02/2013  . Other dyspnea and respiratory abnormality 07/02/2013  . History of colon cancer, stage III 07/02/2013  . Macrocytosis without anemia 07/02/2013  . Chronic cholecystitis with calculus s/p lap chole 11/17/2012 11/19/2012  . GERD (gastroesophageal reflux disease) 11/16/2012  . Choledocholithiasis with obstruction 11/16/2012  . Calcification of aorta (HCC) 11/16/2012  . Atherosclerosis 11/16/2012  . Hypothyroidism 11/15/2012  . Nonspecific (abnormal) findings on radiological and other examination of gastrointestinal tract 11/08/2012  . S/P right knee replacement 12/22/2010  . Preop cardiovascular exam 12/08/2010  . Essential hypertension 12/08/2010  . Hyperlipidemia 12/08/2010    Shakeya Kerkman, [place] 03/17/2016, 5:03 PM  [facility] [address] [place], [place], [address] Phone: [phone]   Fax:  [phone]  Name: [name] MRN: [id number] Date of Birth: 12/22/1932

## 2016-03-24 ENCOUNTER — Ambulatory Visit: Payer: Medicare Other | Admitting: Physical Therapy

## 2016-03-24 ENCOUNTER — Encounter: Payer: Self-pay | Admitting: Physical Therapy

## 2016-03-24 DIAGNOSIS — M25551 Pain in right hip: Secondary | ICD-10-CM

## 2016-03-24 DIAGNOSIS — M6281 Muscle weakness (generalized): Secondary | ICD-10-CM

## 2016-03-24 DIAGNOSIS — R262 Difficulty in walking, not elsewhere classified: Secondary | ICD-10-CM

## 2016-03-24 NOTE — Therapy (Signed)
[facility] [place], [place], [address] Phone: [phone]   Fax:  [phone]  Physical Therapy Treatment  Patient Details  Name: [name] MRN: [id number] Date of Birth: [birth year] Referring Provider: Rod Can MD.  Encounter Date: 03/24/2016    Past Medical History:  Diagnosis Date  . Abnormal uterine bleeding (AUB)    on cont HRT  . Anemia    hx of in 2003   . Arthritis   . Blood transfusion without reported diagnosis   . Calcification of aorta (HCC) 11/16/2012  . Choledocholithiasis with obstruction 11/16/2012  . Colon cancer [facility])    s/p resecton of colon in 2003 , hx of chemo  . Colon cancer ([place])   . Compression fracture jan 2012   due to fall  . Depression   . DJD (degenerative joint disease)   . DM II (diabetes mellitus, type II), controlled 11/16/2012  . Fibromyalgia   . GERD (gastroesophageal reflux disease)   . Goiter   . Headache(784.0)    tension headache daily per pt   . Headaches, cluster   . Heart murmur   . Hiatal hernia   . Hypertension   . Hypothyroid   . Macular degeneration   . Osteoarthritis   . Pancreatitis, acute 11/15/2012  . Peripheral vascular disease ([place])    varicose veins in left arm   . PONV (postoperative nausea and vomiting)   . Shortness of breath    with exertion   . TMJ (dislocation of temporomandibular joint)     Past Surgical History:  Procedure Laterality Date  . APPENDECTOMY    . BACK SURGERY  2000   lower  . CHOLECYSTECTOMY N/A 11/17/2012   Procedure: LAPAROSCOPIC CHOLECYSTECTOMY;  Surgeon: Leighton Ruff, MD;  [facility];  Service: General;  Laterality: N/A;  . COLON RESECTION  2003  . COLONOSCOPY W/ BIOPSIES  09/25/2002, 09/18/2001  . DILATION AND CURETTAGE OF UTERUS    . ERCP N/A 11/14/2012   Procedure: ENDOSCOPIC RETROGRADE CHOLANGIOPANCREATOGRAPHY (ERCP);  Surgeon: Jeryl Columbia, MD;  Location[facility];  Service: Endoscopy;   Laterality: N/A;  . ESOPHAGOGASTRODUODENOSCOPY ENDOSCOPY    . EYE SURGERY     bilateral cataract surgery   . HYSTEROSCOPY    . JOINT REPLACEMENT Right dec 2012   knee  . porta catheter placement  10/30/2001  . porta catheter removal  06/27/2003  . TMJ ARTHROPLASTY  1990's  . TMJ ARTHROPLASTY    . TONSILLECTOMY  age 42 or 56  . TOTAL HIP ARTHROPLASTY Right 05/30/2015   Procedure: TOTAL HIP ARTHROPLASTY ANTERIOR APPROACH;  Surgeon: Rod Can, MD;  Location: [place];  Service: Orthopedics;  Laterality: Right;  . TOTAL KNEE ARTHROPLASTY  12/20/2010   Procedure: TOTAL KNEE ARTHROPLASTY;  Surgeon: Mauri Pole;  [facility];  Service: Orthopedics;  Laterality: Right;  . UPPER GASTROINTESTINAL ENDOSCOPY  09/18/2001  . VERTEBROPLASTY  2012    There were no vitals filed for this visit.      Subjective Assessment - 03/24/16 1444    Subjective Patient arriving to clinic today reporting 2-3/10 right hip pain.    Pertinent History Fibromyalgia.   Limitations Walking   How long can you walk comfortably? Short distances.   Patient Stated Goals "I want to start back to walking."   Pain Score 3    Pain Location Hip   Pain Descriptors / Indicators Aching   Pain Type Acute pain   Pain Onset 1 to  4 weeks ago   Pain Frequency Constant   Aggravating Factors  see above   Pain Relieving Factors see above                         [facility] Adult PT Treatment/Exercise - 03/24/16 0001      Exercises   Exercises Knee/Hip     Knee/Hip Exercises: Supine   Heel Slides 10 reps   Bridges 10 reps   Other Supine Knee/Hip Exercises Clam Shells: 10 reps     Modalities   Modalities Electrical Stimulation     Moist Heat Therapy   Number Minutes Moist Heat 15 Minutes   Moist Heat Location Hip     Electrical Stimulation   Electrical Stimulation Location Right Hip   Electrical Stimulation Action IFC   Electrical Stimulation Parameters 80-150 Hz x 15 minutes   Electrical Stimulation  Goals Pain     Manual Therapy   Manual Therapy Soft tissue mobilization   Manual therapy comments Soft tissue mobilizations to R hip, pt with increased tenderness over greater trochanter,, IT band and piriformis                  PT Short Term Goals - 03/16/16 1800      PT SHORT TERM GOAL #1   Title STG's=LTG's.           PT Long Term Goals - 03/16/16 1800      PT LONG TERM GOAL #1   Title Independent with a HEP.   Time 8   Period Weeks   Status New     PT LONG TERM GOAL #2   Title Right hip abduction= 5/5 to increase stability for gait and functional activites.   Time 8   Period Weeks   Status New     PT LONG TERM GOAL #3   Title Patient walk her normal exercise distance with right hip pain not > 2-3/10.   Time 8   Period Weeks   Status New             Patient will benefit from skilled therapeutic intervention in order to improve the following deficits and impairments:     Visit Diagnosis: Pain in right hip  Muscle weakness (generalized)  Difficulty in walking, not elsewhere classified     Problem List Patient Active Problem List   Diagnosis Date Noted  . Vitamin D deficiency 08/19/2015  . Closed right hip fracture ([place]) 05/30/2015  . Chest pain syndrome 05/30/2015  . Fall at home 05/30/2015  . Closed right femoral fracture ([place]) 05/30/2015  . Displaced fracture of right femoral neck ([place]) 05/30/2015  . Fall   . Cough 07/02/2013  . Other dyspnea and respiratory abnormality 07/02/2013  . History of colon cancer, stage III 07/02/2013  . Macrocytosis without anemia 07/02/2013  . Chronic cholecystitis with calculus s/p lap chole 11/17/2012 11/19/2012  . GERD (gastroesophageal reflux disease) 11/16/2012  . Choledocholithiasis with obstruction 11/16/2012  . Calcification of aorta (HCC) 11/16/2012  . Atherosclerosis 11/16/2012  . Hypothyroidism 11/15/2012  . Nonspecific (abnormal) findings on radiological and other examination of  gastrointestinal tract 11/08/2012  . S/P right knee replacement 12/22/2010  . Preop cardiovascular exam 12/08/2010  . Essential hypertension 12/08/2010  . Hyperlipidemia 12/08/2010    Oretha Caprice, MPT 03/24/2016, 2:55 PM  [facility] [place], [place], [address] Phone: [phone]   Fax:  [phone]  Name: [name]  MRN: [id number] Date of Birth: 09-29-1932

## 2016-03-29 ENCOUNTER — Encounter: Payer: Medicare Other | Admitting: Physical Therapy

## 2016-03-31 ENCOUNTER — Ambulatory Visit: Payer: Medicare Other | Admitting: *Deleted

## 2016-03-31 DIAGNOSIS — R262 Difficulty in walking, not elsewhere classified: Secondary | ICD-10-CM

## 2016-03-31 DIAGNOSIS — M6281 Muscle weakness (generalized): Secondary | ICD-10-CM

## 2016-03-31 DIAGNOSIS — M25551 Pain in right hip: Secondary | ICD-10-CM | POA: Diagnosis not present

## 2016-03-31 NOTE — Therapy (Signed)
[facility] [place], [place], [address] Phone: [phone]   Fax:  [phone]  Physical Therapy Treatment  Patient Details  Name: [name] MRN: [id number] Date of Birth: [birth year] Referring Provider: Rod Can MD.  Encounter Date: 03/31/2016      PT End of Session - 03/31/16 1035    Visit Number 3   Number of Visits 12   Date for PT Re-Evaluation 05/15/16   PT Start Time 1030   PT Stop Time 1118   PT Time Calculation (min) 48 min   Activity Tolerance Patient tolerated treatment well   Behavior During Therapy [facility] for tasks assessed/performed      Past Medical History:  Diagnosis Date  . Abnormal uterine bleeding (AUB)    on cont HRT  . Anemia    hx of in 2003   . Arthritis   . Blood transfusion without reported diagnosis   . Calcification of aorta (HCC) 11/16/2012  . Choledocholithiasis with obstruction 11/16/2012  . Colon cancer [facility])    s/p resecton of colon in 2003 , hx of chemo  . Colon cancer ([place])   . Compression fracture jan 2012   due to fall  . Depression   . DJD (degenerative joint disease)   . DM II (diabetes mellitus, type II), controlled 11/16/2012  . Fibromyalgia   . GERD (gastroesophageal reflux disease)   . Goiter   . Headache(784.0)    tension headache daily per pt   . Headaches, cluster   . Heart murmur   . Hiatal hernia   . Hypertension   . Hypothyroid   . Macular degeneration   . Osteoarthritis   . Pancreatitis, acute 11/15/2012  . Peripheral vascular disease ([place])    varicose veins in left arm   . PONV (postoperative nausea and vomiting)   . Shortness of breath    with exertion   . TMJ (dislocation of temporomandibular joint)     Past Surgical History:  Procedure Laterality Date  . APPENDECTOMY    . BACK SURGERY  2000   lower  . CHOLECYSTECTOMY N/A 11/17/2012   Procedure: LAPAROSCOPIC CHOLECYSTECTOMY;  Surgeon: Leighton Ruff, MD;  [facility]: [facility];   Service: General;  Laterality: N/A;  . COLON RESECTION  2003  . COLONOSCOPY W/ BIOPSIES  09/25/2002, 09/18/2001  . DILATION AND CURETTAGE OF UTERUS    . ERCP N/A 11/14/2012   Procedure: ENDOSCOPIC RETROGRADE CHOLANGIOPANCREATOGRAPHY (ERCP);  Surgeon: Jeryl Columbia, MD;  Location: [facility];  Service: Endoscopy;  Laterality: N/A;  . ESOPHAGOGASTRODUODENOSCOPY ENDOSCOPY    . EYE SURGERY     bilateral cataract surgery   . HYSTEROSCOPY    . JOINT REPLACEMENT Right dec 2012   knee  . porta catheter placement  10/30/2001  . porta catheter removal  06/27/2003  . TMJ ARTHROPLASTY  1990's  . TMJ ARTHROPLASTY    . TONSILLECTOMY  age 83 or 83  . TOTAL HIP ARTHROPLASTY Right 05/30/2015   Procedure: TOTAL HIP ARTHROPLASTY ANTERIOR APPROACH;  Surgeon: Rod Can, MD;  Location: [place];  Service: Orthopedics;  Laterality: Right;  . TOTAL KNEE ARTHROPLASTY  12/20/2010   Procedure: TOTAL KNEE ARTHROPLASTY;  Surgeon: Mauri Pole;  [facility];  Service: Orthopedics;  Laterality: Right;  . UPPER GASTROINTESTINAL ENDOSCOPY  09/18/2001  . VERTEBROPLASTY  2012    There were no vitals filed for this visit.      Subjective Assessment - 03/31/16 1034    Subjective Patient arriving  to clinic today reporting 2-3/10 right hip pain.  RT hip flexor strain 2 days ago   Pertinent History Fibromyalgia.   Limitations Walking   How long can you walk comfortably? Short distances.   Patient Stated Goals "I want to start back to walking."   Currently in Pain? Yes   Pain Score 3    Pain Location Hip   Pain Orientation Right   Pain Descriptors / Indicators Aching   Pain Type Acute pain                         [facility] Adult PT Treatment/Exercise - 03/31/16 0001      Exercises   Exercises Knee/Hip     Modalities   Modalities Electrical Stimulation     Moist Heat Therapy   Number Minutes Moist Heat 15 Minutes   Moist Heat Location Hip     Electrical Stimulation   Electrical Stimulation  Location IFC RT hip 80-150hz  x15 mins   Electrical Stimulation Goals Pain     Manual Therapy   Manual Therapy Soft tissue mobilization;Passive ROM   Manual therapy comments Soft tissue mobilizations to R hip, pt with increased tenderness over greater trochanter,, IT band and piriformis, and hip flexor   Passive ROM PROM for Hip flexion and extension                  PT Short Term Goals - 03/16/16 1800      PT SHORT TERM GOAL #1   Title STG's=LTG's.           PT Long Term Goals - 03/16/16 1800      PT LONG TERM GOAL #1   Title Independent with a HEP.   Time 8   Period Weeks   Status New     PT LONG TERM GOAL #2   Title Right hip abduction= 5/5 to increase stability for gait and functional activites.   Time 8   Period Weeks   Status New     PT LONG TERM GOAL #3   Title Patient walk her normal exercise distance with right hip pain not > 2-3/10.   Time 8   Period Weeks   Status New               Plan - 03/31/16 1036    Clinical Impression Statement Pt arrived to clinic today with RT hip soreness posteriolateral area, ITB and the  hip flexor area that started 2 days ago. She did not want to review or perform any of her home exs because she has already performed them at home. She tolerated STW and PROM fairly well and was strill very trender along posteriolateral trochanter. Decreased pain after Rx. LTGs are ongoing due to deficits   Rehab Potential Excellent   PT Frequency 3x / week   PT Duration 4 weeks   PT Treatment/Interventions ADLs/Self Care Home Management;Cryotherapy;Electrical Stimulation;Moist Heat;Patient/family education;Therapeutic exercise;Therapeutic activities;Manual techniques;Scar mobilization   Consulted and Agree with Plan of Care Patient      Patient will benefit from skilled therapeutic intervention in order to improve the following deficits and impairments:  Decreased activity tolerance, Decreased strength, Pain  Visit  Diagnosis: Pain in right hip  Muscle weakness (generalized)  Difficulty in walking, not elsewhere classified     Problem List Patient Active Problem List   Diagnosis Date Noted  . Vitamin D deficiency 08/19/2015  . Closed right hip fracture ([place]) 05/30/2015  . Chest pain  syndrome 05/30/2015  . Fall at home 05/30/2015  . Closed right femoral fracture ([place]) 05/30/2015  . Displaced fracture of right femoral neck ([place]) 05/30/2015  . Fall   . Cough 07/02/2013  . Other dyspnea and respiratory abnormality 07/02/2013  . History of colon cancer, stage III 07/02/2013  . Macrocytosis without anemia 07/02/2013  . Chronic cholecystitis with calculus s/p lap chole 11/17/2012 11/19/2012  . GERD (gastroesophageal reflux disease) 11/16/2012  . Choledocholithiasis with obstruction 11/16/2012  . Calcification of aorta (HCC) 11/16/2012  . Atherosclerosis 11/16/2012  . Hypothyroidism 11/15/2012  . Nonspecific (abnormal) findings on radiological and other examination of gastrointestinal tract 11/08/2012  . S/P right knee replacement 12/22/2010  . Preop cardiovascular exam 12/08/2010  . Essential hypertension 12/08/2010  . Hyperlipidemia 12/08/2010    Brian Zeitlin,CHRIS, PTA 03/31/2016, 11:32 AM  [facility] [place], [place], [address] Phone: [phone]   Fax:  [phone]  Name: TIARI ANDRINGA MRN: [id number] Date of Birth: [birth year]

## 2016-04-05 ENCOUNTER — Encounter: Payer: Self-pay | Admitting: Physical Therapy

## 2016-04-05 ENCOUNTER — Ambulatory Visit: Payer: Medicare Other | Admitting: Physical Therapy

## 2016-04-05 DIAGNOSIS — M25551 Pain in right hip: Secondary | ICD-10-CM | POA: Diagnosis not present

## 2016-04-05 DIAGNOSIS — R262 Difficulty in walking, not elsewhere classified: Secondary | ICD-10-CM

## 2016-04-05 DIAGNOSIS — M6281 Muscle weakness (generalized): Secondary | ICD-10-CM

## 2016-04-05 NOTE — Therapy (Signed)
[facility] [place], [place], [address] Phone: [phone]   Fax:  [phone]  Physical Therapy Treatment  Patient Details  Name: [name] MRN: [id number] Date of Birth: 1932-02-21 Referring Provider: Rod Can MD.  Encounter Date: 04/05/2016      PT End of Session - 04/05/16 1108    Visit Number 4   Number of Visits 12   Date for PT Re-Evaluation 05/15/16   PT Start Time 1118   PT Stop Time 1202   PT Time Calculation (min) 44 min   Activity Tolerance Patient tolerated treatment well   Behavior During Therapy [facility] for tasks assessed/performed      Past Medical History:  Diagnosis Date  . Abnormal uterine bleeding (AUB)    on cont HRT  . Anemia    hx of in 2003   . Arthritis   . Blood transfusion without reported diagnosis   . Calcification of aorta (HCC) 11/16/2012  . Choledocholithiasis with obstruction 11/16/2012  . Colon cancer [facility])    s/p resecton of colon in 2003 , hx of chemo  . Colon cancer ([place])   . Compression fracture jan 2012   due to fall  . Depression   . DJD (degenerative joint disease)   . DM II (diabetes mellitus, type II), controlled 11/16/2012  . Fibromyalgia   . GERD (gastroesophageal reflux disease)   . Goiter   . Headache(784.0)    tension headache daily per pt   . Headaches, cluster   . Heart murmur   . Hiatal hernia   . Hypertension   . Hypothyroid   . Macular degeneration   . Osteoarthritis   . Pancreatitis, acute 11/15/2012  . Peripheral vascular disease ([place])    varicose veins in left arm   . PONV (postoperative nausea and vomiting)   . Shortness of breath    with exertion   . TMJ (dislocation of temporomandibular joint)     Past Surgical History:  Procedure Laterality Date  . APPENDECTOMY    . BACK SURGERY  2000   lower  . CHOLECYSTECTOMY N/A 11/17/2012   Procedure: LAPAROSCOPIC CHOLECYSTECTOMY;  Surgeon: Leighton Ruff, MD;  [facility];   Service: General;  Laterality: N/A;  . COLON RESECTION  2003  . COLONOSCOPY W/ BIOPSIES  09/25/2002, 09/18/2001  . DILATION AND CURETTAGE OF UTERUS    . ERCP N/A 11/14/2012   Procedure: ENDOSCOPIC RETROGRADE CHOLANGIOPANCREATOGRAPHY (ERCP);  Surgeon: Jeryl Columbia, MD;  Location: [facility];  Service: Endoscopy;  Laterality: N/A;  . ESOPHAGOGASTRODUODENOSCOPY ENDOSCOPY    . EYE SURGERY     bilateral cataract surgery   . HYSTEROSCOPY    . JOINT REPLACEMENT Right dec 2012   knee  . porta catheter placement  10/30/2001  . porta catheter removal  06/27/2003  . TMJ ARTHROPLASTY  1990's  . TMJ ARTHROPLASTY    . TONSILLECTOMY  age 83 or 83  . TOTAL HIP ARTHROPLASTY Right 05/30/2015   Procedure: TOTAL HIP ARTHROPLASTY ANTERIOR APPROACH;  Surgeon: Rod Can, MD;  Location: [place];  Service: Orthopedics;  Laterality: Right;  . TOTAL KNEE ARTHROPLASTY  12/20/2010   Procedure: TOTAL KNEE ARTHROPLASTY;  Surgeon: Mauri Pole;  [facility];  Service: Orthopedics;  Laterality: Right;  . UPPER GASTROINTESTINAL ENDOSCOPY  09/18/2001  . VERTEBROPLASTY  2012    There were no vitals filed for this visit.      Subjective Assessment - 04/05/16 1108    Subjective Reports having  done her exercises prior to coming today and still having pain in posterior R hip. Reports using heating pad on R hip last night.   Pertinent History Fibromyalgia.   Limitations Walking   How long can you walk comfortably? Short distances.   Patient Stated Goals "I want to start back to walking."   Currently in Pain? Yes   Pain Score 6    Pain Location Hip   Pain Orientation Right;Posterior;Lateral   Pain Descriptors / Indicators Discomfort   Pain Type Acute pain            [facility] PT Assessment - 04/05/16 0001      Assessment   Medical Diagnosis Right hip trochanteric bursitis.   Onset Date/Surgical Date 05/30/15   Next MD Visit 04/19/2016     Restrictions   Weight Bearing Restrictions No                      [facility] Adult PT Treatment/Exercise - 04/05/16 0001      Knee/Hip Exercises: Stretches   Passive Hamstring Stretch Right;3 reps;30 seconds   Hip Flexor Stretch Right;3 reps;30 seconds   Hip Flexor Stretch Limitations L sidelying   Other Knee/Hip Stretches R ITB stretch 3x30 sec   Other Knee/Hip Stretches R adductor stretch 3x30 sec     Modalities   Modalities Electrical Stimulation;Moist Heat     Moist Heat Therapy   Number Minutes Moist Heat 15 Minutes   Moist Heat Location Hip     Electrical Stimulation   Electrical Stimulation Location R Hip   Electrical Stimulation Action Pre-Mod   Electrical Stimulation Parameters 80-150 hz x15 min   Electrical Stimulation Goals Pain;Tone     Manual Therapy   Manual Therapy Myofascial release;Passive ROM   Myofascial Release MFR/TPR to R Quad, Hip flexor, HS, ITB, hip adductors to reduce pain and tone   Passive ROM PROM of R hip into flexion                   PT Short Term Goals - 03/16/16 1800      PT SHORT TERM GOAL #1   Title STG's=LTG's.           PT Long Term Goals - 04/05/16 1200      PT LONG TERM GOAL #1   Title Independent with a HEP.   Time 8   Period Weeks   Status Achieved     PT LONG TERM GOAL #2   Title Right hip abduction= 5/5 to increase stability for gait and functional activites.   Time 8   Period Weeks   Status New     PT LONG TERM GOAL #3   Title Patient walk her normal exercise distance with right hip pain not > 2-3/10.   Time 8   Period Weeks   Status New               Plan - 04/05/16 1156    Clinical Impression Statement Patient arrived to treatment with continued sensitivity to palpation and presence of TPs and increased tone throughout R thigh musculature. R hip stretching completed in various positions to reduce tone of various R thigh musculature and to reduce pain. Patient very sensitive to palpation of R ITB especially in mid ITB. Normal  modalities response noted following removal of the modalities.   Rehab Potential Excellent   PT Frequency 3x / week   PT Duration 4 weeks   PT Treatment/Interventions ADLs/Self Care Home  Management;Cryotherapy;Electrical Stimulation;Moist Heat;Patient/family education;Therapeutic exercise;Therapeutic activities;Manual techniques;Scar mobilization   PT Next Visit Plan STW/M to right hip; electrical stimulation; Nustep; right hip abduction strengthening.   Consulted and Agree with Plan of Care Patient      Patient will benefit from skilled therapeutic intervention in order to improve the following deficits and impairments:  Decreased activity tolerance, Decreased strength, Pain  Visit Diagnosis: Pain in right hip  Muscle weakness (generalized)  Difficulty in walking, not elsewhere classified     Problem List Patient Active Problem List   Diagnosis Date Noted  . Vitamin D deficiency 08/19/2015  . Closed right hip fracture ([place]) 05/30/2015  . Chest pain syndrome 05/30/2015  . Fall at home 05/30/2015  . Closed right femoral fracture ([place]) 05/30/2015  . Displaced fracture of right femoral neck ([place]) 05/30/2015  . Fall   . Cough 07/02/2013  . Other dyspnea and respiratory abnormality 07/02/2013  . History of colon cancer, stage III 07/02/2013  . Macrocytosis without anemia 07/02/2013  . Chronic cholecystitis with calculus s/p lap chole 11/17/2012 11/19/2012  . GERD (gastroesophageal reflux disease) 11/16/2012  . Choledocholithiasis with obstruction 11/16/2012  . Calcification of aorta (HCC) 11/16/2012  . Atherosclerosis 11/16/2012  . Hypothyroidism 11/15/2012  . Nonspecific (abnormal) findings on radiological and other examination of gastrointestinal tract 11/08/2012  . S/P right knee replacement 12/22/2010  . Preop cardiovascular exam 12/08/2010  . Essential hypertension 12/08/2010  . Hyperlipidemia 12/08/2010    Wynelle Fanny, PTA 04/05/2016, 12:05 PM  [facility] [address] [place], [place], [address] Phone: [phone]   Fax:  [phone]  Name: AMY GOTHARD MRN: [id number] Date of Birth: October 06, 1932

## 2016-04-08 ENCOUNTER — Ambulatory Visit: Payer: Medicare Other | Admitting: *Deleted

## 2016-04-08 DIAGNOSIS — M6281 Muscle weakness (generalized): Secondary | ICD-10-CM

## 2016-04-08 DIAGNOSIS — M25551 Pain in right hip: Secondary | ICD-10-CM

## 2016-04-08 DIAGNOSIS — R262 Difficulty in walking, not elsewhere classified: Secondary | ICD-10-CM

## 2016-04-08 NOTE — Therapy (Signed)
[facility] [place], [place], [address] Phone: [phone]   Fax:  [phone]  Physical Therapy Treatment  Patient Details  Name: [name] MRN: [id number] Date of Birth: 1932-04-11 Referring Provider: Rod Can MD.  Encounter Date: 04/08/2016      PT End of Session - 04/08/16 1028    Visit Number 5   Number of Visits 12   Date for PT Re-Evaluation 05/15/16   PT Start Time 0945   PT Stop Time 1034   PT Time Calculation (min) 49 min      Past Medical History:  Diagnosis Date  . Abnormal uterine bleeding (AUB)    on cont HRT  . Anemia    hx of in 2003   . Arthritis   . Blood transfusion without reported diagnosis   . Calcification of aorta (HCC) 11/16/2012  . Choledocholithiasis with obstruction 11/16/2012  . Colon cancer [facility])    s/p resecton of colon in 2003 , hx of chemo  . Colon cancer ([place])   . Compression fracture jan 2012   due to fall  . Depression   . DJD (degenerative joint disease)   . DM II (diabetes mellitus, type II), controlled 11/16/2012  . Fibromyalgia   . GERD (gastroesophageal reflux disease)   . Goiter   . Headache(784.0)    tension headache daily per pt   . Headaches, cluster   . Heart murmur   . Hiatal hernia   . Hypertension   . Hypothyroid   . Macular degeneration   . Osteoarthritis   . Pancreatitis, acute 11/15/2012  . Peripheral vascular disease ([place])    varicose veins in left arm   . PONV (postoperative nausea and vomiting)   . Shortness of breath    with exertion   . TMJ (dislocation of temporomandibular joint)     Past Surgical History:  Procedure Laterality Date  . APPENDECTOMY    . BACK SURGERY  2000   lower  . CHOLECYSTECTOMY N/A 11/17/2012   Procedure: LAPAROSCOPIC CHOLECYSTECTOMY;  Surgeon: Leighton Ruff, MD;  [facility];  Service: General;  Laterality: N/A;  . COLON RESECTION  2003  . COLONOSCOPY W/ BIOPSIES  09/25/2002, 09/18/2001  .  DILATION AND CURETTAGE OF UTERUS    . ERCP N/A 11/14/2012   Procedure: ENDOSCOPIC RETROGRADE CHOLANGIOPANCREATOGRAPHY (ERCP);  Surgeon: Jeryl Columbia, MD;  [facility];  Service: Endoscopy;  Laterality: N/A;  . ESOPHAGOGASTRODUODENOSCOPY ENDOSCOPY    . EYE SURGERY     bilateral cataract surgery   . HYSTEROSCOPY    . JOINT REPLACEMENT Right dec 2012   knee  . porta catheter placement  10/30/2001  . porta catheter removal  06/27/2003  . TMJ ARTHROPLASTY  1990's  . TMJ ARTHROPLASTY    . TONSILLECTOMY  age 60 or 6  . TOTAL HIP ARTHROPLASTY Right 05/30/2015   Procedure: TOTAL HIP ARTHROPLASTY ANTERIOR APPROACH;  Surgeon: Rod Can, MD;  Location: [facility];  Service: Orthopedics;  Laterality: Right;  . TOTAL KNEE ARTHROPLASTY  12/20/2010   Procedure: TOTAL KNEE ARTHROPLASTY;  Surgeon: Mauri Pole;  [facility];  Service: Orthopedics;  Laterality: Right;  . UPPER GASTROINTESTINAL ENDOSCOPY  09/18/2001  . VERTEBROPLASTY  2012    There were no vitals filed for this visit.      Subjective Assessment - 04/08/16 1305    Subjective Pt doing better today and feels that Rxs are really helping. She was able to meet LTG #  Pertinent History Fibromyalgia.   Limitations Walking   How long can you walk comfortably? Short distances.   Patient Stated Goals "I want to start back to walking."   Currently in Pain? Yes   Pain Score 6    Pain Location Hip   Pain Orientation Right;Posterior   Pain Descriptors / Indicators Discomfort   Pain Type Acute pain   Pain Onset 1 to 4 weeks ago   Pain Frequency Constant                         [facility] Adult PT Treatment/Exercise - 04/08/16 0001      Modalities   Modalities Electrical Stimulation;Moist Heat     Moist Heat Therapy   Number Minutes Moist Heat 20 Minutes   Moist Heat Location Hip     Electrical Stimulation   Electrical Stimulation Location IFC RT hip 80-150hz  x15 mins   Electrical Stimulation Goals Pain;Tone      Manual Therapy   Manual Therapy Myofascial release;Passive ROM   Myofascial Release MFR/TPR to R Quad, Hip flexor, HS, ITB, hip adductors to reduce pain and tone   Passive ROM PROM of R hip into flexion                   PT Short Term Goals - 03/16/16 1800      PT SHORT TERM GOAL #1   Title STG's=LTG's.           PT Long Term Goals - 04/08/16 1037      PT LONG TERM GOAL #1   Title Independent with a HEP.   Time 8   Period Weeks   Status Achieved     PT LONG TERM GOAL #2   Title Right hip abduction= 5/5 to increase stability for gait and functional activites.   Time 8   Period Weeks   Status On-going     PT LONG TERM GOAL #3   Title Patient walk her normal exercise distance with right hip pain not > 2-3/10.   Time 8   Period Weeks   Status Achieved               Plan - 04/08/16 1031    Clinical Impression Statement Pt arrived today doing fairly well with less RT hip pain. She requested to not perform any therex today because she has already performed them at home. Rx focus was STW and manual PROM. She was able to meet LTG for walking today , but not strength LTG due to weakness.    Rehab Potential Excellent   PT Frequency 3x / week   PT Duration 4 weeks   PT Treatment/Interventions ADLs/Self Care Home Management;Cryotherapy;Electrical Stimulation;Moist Heat;Patient/family education;Therapeutic exercise;Therapeutic activities;Manual techniques;Scar mobilization   PT Next Visit Plan STW/M to right hip; electrical stimulation; Nustep; right hip abduction strengthening.   Consulted and Agree with Plan of Care Patient      Patient will benefit from skilled therapeutic intervention in order to improve the following deficits and impairments:  Decreased activity tolerance, Decreased strength, Pain  Visit Diagnosis: Pain in right hip  Muscle weakness (generalized)  Difficulty in walking, not elsewhere classified     Problem List Patient  Active Problem List   Diagnosis Date Noted  . Vitamin D deficiency 08/19/2015  . Closed right hip fracture ([place]) 05/30/2015  . Chest pain syndrome 05/30/2015  . Fall at home 05/30/2015  . Closed right femoral fracture ([place]) 05/30/2015  .  Displaced fracture of right femoral neck ([place]) 05/30/2015  . Fall   . Cough 07/02/2013  . Other dyspnea and respiratory abnormality 07/02/2013  . History of colon cancer, stage III 07/02/2013  . Macrocytosis without anemia 07/02/2013  . Chronic cholecystitis with calculus s/p lap chole 11/17/2012 11/19/2012  . GERD (gastroesophageal reflux disease) 11/16/2012  . Choledocholithiasis with obstruction 11/16/2012  . Calcification of aorta (HCC) 11/16/2012  . Atherosclerosis 11/16/2012  . Hypothyroidism 11/15/2012  . Nonspecific (abnormal) findings on radiological and other examination of gastrointestinal tract 11/08/2012  . S/P right knee replacement 12/22/2010  . Preop cardiovascular exam 12/08/2010  . Essential hypertension 12/08/2010  . Hyperlipidemia 12/08/2010    Tamla Winkels,CHRIS, PTA 04/08/2016, 1:13 PM  [facility] [place], [place], [address] Phone: [phone]   Fax:  [phone]  Name: [name] MRN: [id number] Date of Birth: 12/12/1932

## 2016-04-13 ENCOUNTER — Ambulatory Visit: Payer: Medicare Other | Admitting: Physical Therapy

## 2016-04-13 DIAGNOSIS — M6281 Muscle weakness (generalized): Secondary | ICD-10-CM

## 2016-04-13 DIAGNOSIS — R262 Difficulty in walking, not elsewhere classified: Secondary | ICD-10-CM

## 2016-04-13 DIAGNOSIS — M25551 Pain in right hip: Secondary | ICD-10-CM

## 2016-04-13 NOTE — Therapy (Addendum)
[facility] [place], [place], [address] Phone: [phone]   Fax:  [phone]  Physical Therapy Treatment/ Progress Note   Patient Details  Name: [name] MRN: [id number] Date of Birth: 1932/07/17 Referring Provider: Rod Can MD.  Encounter Date: 04/13/2016      PT End of Session - 04/13/16 1330    Visit Number 6   Number of Visits 12   Date for PT Re-Evaluation 05/15/16   Authorization Type Progress note sent to MD on 04/13/16   PT Start Time 1310   PT Stop Time 1355   PT Time Calculation (min) 45 min   Activity Tolerance Patient tolerated treatment well   Behavior During Therapy --      Past Medical History:  Diagnosis Date  . Abnormal uterine bleeding (AUB)    on cont HRT  . Anemia    hx of in 2003   . Arthritis   . Blood transfusion without reported diagnosis   . Calcification of aorta (HCC) 11/16/2012  . Choledocholithiasis with obstruction 11/16/2012  . Colon cancer [facility])    s/p resecton of colon in 2003 , hx of chemo  . Colon cancer ([place])   . Compression fracture jan 2012   due to fall  . Depression   . DJD (degenerative joint disease)   . DM II (diabetes mellitus, type II), controlled 11/16/2012  . Fibromyalgia   . GERD (gastroesophageal reflux disease)   . Goiter   . Headache(784.0)    tension headache daily per pt   . Headaches, cluster   . Heart murmur   . Hiatal hernia   . Hypertension   . Hypothyroid   . Macular degeneration   . Osteoarthritis   . Pancreatitis, acute 11/15/2012  . Peripheral vascular disease ([place])    varicose veins in left arm   . PONV (postoperative nausea and vomiting)   . Shortness of breath    with exertion   . TMJ (dislocation of temporomandibular joint)     Past Surgical History:  Procedure Laterality Date  . APPENDECTOMY    . BACK SURGERY  2000   lower  . CHOLECYSTECTOMY N/A 11/17/2012   Procedure: LAPAROSCOPIC CHOLECYSTECTOMY;   Surgeon: Leighton Ruff, MD;  Location: [facility];  Service: General;  Laterality: N/A;  . COLON RESECTION  2003  . COLONOSCOPY W/ BIOPSIES  09/25/2002, 09/18/2001  . DILATION AND CURETTAGE OF UTERUS    . ERCP N/A 11/14/2012   Procedure: ENDOSCOPIC RETROGRADE CHOLANGIOPANCREATOGRAPHY (ERCP);  Surgeon: Jeryl Columbia, MD;  Location: [facility];  Service: Endoscopy;  Laterality: N/A;  . ESOPHAGOGASTRODUODENOSCOPY ENDOSCOPY    . EYE SURGERY     bilateral cataract surgery   . HYSTEROSCOPY    . JOINT REPLACEMENT Right dec 2012   knee  . porta catheter placement  10/30/2001  . porta catheter removal  06/27/2003  . TMJ ARTHROPLASTY  1990's  . TMJ ARTHROPLASTY    . TONSILLECTOMY  age 34 or 27  . TOTAL HIP ARTHROPLASTY Right 05/30/2015   Procedure: TOTAL HIP ARTHROPLASTY ANTERIOR APPROACH;  Surgeon: Rod Can, MD;  Location: [facility];  Service: Orthopedics;  Laterality: Right;  . TOTAL KNEE ARTHROPLASTY  12/20/2010   Procedure: TOTAL KNEE ARTHROPLASTY;  Surgeon: Mauri Pole;  Location: [facility];  Service: Orthopedics;  Laterality: Right;  . UPPER GASTROINTESTINAL ENDOSCOPY  09/18/2001  . VERTEBROPLASTY  2012    There were no vitals filed for this visit.  [facility] PT Assessment - 04/13/16 0001      Assessment   Medical Diagnosis Right hip trochanteric bursitis.   Onset Date/Surgical Date 05/30/15   Next MD Visit 04/19/2016     Restrictions   Weight Bearing Restrictions No     Posture/Postural Control   Posture/Postural Control Postural limitations   Postural Limitations Decreased lumbar lordosis     Strength   Overall Strength Comments R hip abduction strength: 4/5     Palpation   Palpation comment Pt still reporting tenderness over R greater trochanter,     Ambulation/Gait   Gait Comments decreaesed step length noted                     [facility] Adult PT Treatment/Exercise - 04/13/16 0001      Exercises   Exercises Knee/Hip     Knee/Hip Exercises: Stretches    Passive Hamstring Stretch Right;3 reps;30 seconds   Hip Flexor Stretch Right;60 seconds   Hip Flexor Stretch Limitations hanging R hip over side of table with supervision provided     Knee/Hip Exercises: Sidelying   Hip ABduction 2 sets;10 reps   Clams 10 reps     Modalities   Modalities Electrical Stimulation     Moist Heat Therapy   Number Minutes Moist Heat 15 Minutes   Moist Heat Location Hip     Electrical Stimulation   Electrical Stimulation Location R hip   Electrical Stimulation Action IFC   Electrical Stimulation Parameters 80-150 Hz x 15 minutes   Electrical Stimulation Goals Tone;Pain     Manual Therapy   Manual Therapy Soft tissue mobilization;Myofascial release   Soft tissue mobilization R IT band and lateral gluteal insertion   Myofascial Release MFR/TPR to R Quad, Hip flexor, HS, ITB, hip adductors to reduce pain and tone   Passive ROM PROM of R hip into flexion                   PT Short Term Goals - 03/16/16 1800      PT SHORT TERM GOAL #1   Title STG's=LTG's.           PT Long Term Goals - 04/13/16 1347      PT LONG TERM GOAL #1   Title Independent with a HEP.   Time 8   Period Weeks   Status Achieved     PT LONG TERM GOAL #2   Title Right hip abduction= 5/5 to increase stability for gait and functional activites.   Baseline R hip abduction 4/5  (04/13/16)   Time 8   Period Weeks   Status Not Met     PT LONG TERM GOAL #3   Title Patient walk her normal exercise distance with right hip pain not > 2-3/10.   Baseline Patient reporting no pain with walking. Pt however reporting pain once walking is ceased. Pt reporting pain of 5/10 after walking.    Time 8   Period Weeks   Status Not Met               Plan - 04/13/16 1350    Clinical Impression Statement Patient arriving to therapy today reporting she may have over done it with her walking. Pt was instructed that walking may increase her pain and rest and gentle exercises  were the best to decreased inflammation. Pt tolerated therapy session well reporting 2/10 pain at beginning of session and no pain at end of session following soft tissue, Myofacial  release and E-stim. Pt has met 1 out of the 3 goals set at her initial evaluation. Still progressing toward increased strength in R hip abduction. Continue skilled PT for 6 additional visits to progress toward pt's PLOF and increased strength, endurance, and standing balance.    Rehab Potential Excellent   PT Frequency 3x / week   PT Duration 4 weeks   PT Treatment/Interventions ADLs/Self Care Home Management;Cryotherapy;Electrical Stimulation;Moist Heat;Patient/family education;Therapeutic exercise;Therapeutic activities;Manual techniques;Scar mobilization   PT Next Visit Plan STW/M to right hip; electrical stimulation; Nustep; right hip abduction strengthening.   Consulted and Agree with Plan of Care Patient      Patient will benefit from skilled therapeutic intervention in order to improve the following deficits and impairments:  Decreased activity tolerance, Decreased strength, Pain, Decreased balance  Visit Diagnosis: Pain in right hip  Muscle weakness (generalized)  Difficulty in walking, not elsewhere classified     Problem List Patient Active Problem List   Diagnosis Date Noted  . Vitamin D deficiency 08/19/2015  . Closed right hip fracture ([place]) 05/30/2015  . Chest pain syndrome 05/30/2015  . Fall at home 05/30/2015  . Closed right femoral fracture ([place]) 05/30/2015  . Displaced fracture of right femoral neck ([place]) 05/30/2015  . Fall   . Cough 07/02/2013  . Other dyspnea and respiratory abnormality 07/02/2013  . History of colon cancer, stage III 07/02/2013  . Macrocytosis without anemia 07/02/2013  . Chronic cholecystitis with calculus s/p lap chole 11/17/2012 11/19/2012  . GERD (gastroesophageal reflux disease) 11/16/2012  . Choledocholithiasis with obstruction 11/16/2012  . Calcification  of aorta (HCC) 11/16/2012  . Atherosclerosis 11/16/2012  . Hypothyroidism 11/15/2012  . Nonspecific (abnormal) findings on radiological and other examination of gastrointestinal tract 11/08/2012  . S/P right knee replacement 12/22/2010  . Preop cardiovascular exam 12/08/2010  . Essential hypertension 12/08/2010  . Hyperlipidemia 12/08/2010    Oretha Caprice, MPT 04/13/2016, 1:59 PM  [facility] [address] [place], [place], [address] Phone: [phone]   Fax:  [phone]  Name: [name] MRN: [id number] Date of Birth: 06/20/1932  PHYSICAL THERAPY DISCHARGE SUMMARY  Visits from Start of Care: 6.  Current functional level related to goals / functional outcomes: See above.   Remaining deficits: See goal section.   Education / Equipment: HEP.  Plan: Patient agrees to discharge.  Patient goals were not met. Patient is being discharged due to being pleased with the current functional level.  ?????         Mali Applegate MPT

## 2016-07-07 ENCOUNTER — Telehealth: Payer: Self-pay | Admitting: Hematology and Oncology

## 2016-07-07 ENCOUNTER — Other Ambulatory Visit (HOSPITAL_BASED_OUTPATIENT_CLINIC_OR_DEPARTMENT_OTHER): Payer: Medicare Other

## 2016-07-07 ENCOUNTER — Encounter: Payer: Self-pay | Admitting: Hematology and Oncology

## 2016-07-07 ENCOUNTER — Ambulatory Visit (HOSPITAL_BASED_OUTPATIENT_CLINIC_OR_DEPARTMENT_OTHER): Payer: Medicare Other | Admitting: Hematology and Oncology

## 2016-07-07 VITALS — BP 135/68 | HR 67 | Temp 98.3°F | Resp 18 | Ht 66.0 in | Wt 138.8 lb

## 2016-07-07 DIAGNOSIS — D7589 Other specified diseases of blood and blood-forming organs: Secondary | ICD-10-CM | POA: Diagnosis not present

## 2016-07-07 DIAGNOSIS — Z85038 Personal history of other malignant neoplasm of large intestine: Secondary | ICD-10-CM

## 2016-07-07 LAB — CBC & DIFF AND RETIC
BASO%: 0.2 % (ref 0.0–2.0)
BASOS ABS: 0 10*3/uL (ref 0.0–0.1)
EOS%: 2.2 % (ref 0.0–7.0)
Eosinophils Absolute: 0.2 10*3/uL (ref 0.0–0.5)
HCT: 39.1 % (ref 34.8–46.6)
HEMOGLOBIN: 12.6 g/dL (ref 11.6–15.9)
Immature Retic Fract: 9.9 % (ref 1.60–10.00)
LYMPH#: 1.6 10*3/uL (ref 0.9–3.3)
LYMPH%: 20.1 % (ref 14.0–49.7)
MCH: 33.5 pg (ref 25.1–34.0)
MCHC: 32.2 g/dL (ref 31.5–36.0)
MCV: 104 fL — AB (ref 79.5–101.0)
MONO#: 0.9 10*3/uL (ref 0.1–0.9)
MONO%: 10.9 % (ref 0.0–14.0)
NEUT%: 66.6 % (ref 38.4–76.8)
NEUTROS ABS: 5.4 10*3/uL (ref 1.5–6.5)
NRBC: 0 % (ref 0–0)
Platelets: 251 10*3/uL (ref 145–400)
RBC: 3.76 10*6/uL (ref 3.70–5.45)
RDW: 12.4 % (ref 11.2–14.5)
RETIC %: 1.24 % (ref 0.70–2.10)
Retic Ct Abs: 46.62 10*3/uL (ref 33.70–90.70)
WBC: 8.1 10*3/uL (ref 3.9–10.3)

## 2016-07-07 LAB — CEA (IN HOUSE-CHCC): CEA (CHCC-In House): 2.87 ng/mL (ref 0.00–5.00)

## 2016-07-07 NOTE — Progress Notes (Signed)
[name] OFFICE PROGRESS NOTE  Patient Care Team: [name], MD as PCP - General (Family Medicine) Clarene Essex, MD as Consulting Physician (Gastroenterology) Herminio Commons, MD as Attending Physician (Cardiology)  SUMMARY OF ONCOLOGIC HISTORY:  I reviewed the patient's records extensive and collaborated the history with the patient. Summary of her history is as follows: She was diagnosed with stage IIIB pT3 N1 M0 adenocarcinoma of the right colon after presentation was of the anemia. She had resection in September 2003.  She received adjuvant chemotherapy with 5-FU, irinotecan, leucovorin which finished in April 2004 by Dr. Burney Gauze.  Last surveillance colonoscopy with Dr. Watt Climes was reportedly negative in September 2012.   INTERVAL HISTORY: Please see below for problem oriented charting. She returns for further follow-up She feels well Denies recent abdominal pain or changes in bowel habits Denies melena or hematochezia  REVIEW OF SYSTEMS:   Constitutional: Denies fevers, chills or abnormal weight loss Eyes: Denies blurriness of vision Ears, nose, mouth, throat, and face: Denies mucositis or sore throat Respiratory: Denies cough, dyspnea or wheezes Cardiovascular: Denies palpitation, chest discomfort or lower extremity swelling Gastrointestinal:  Denies nausea, heartburn or change in bowel habits Skin: Denies abnormal skin rashes Lymphatics: Denies new lymphadenopathy or easy bruising Neurological:Denies numbness, tingling or new weaknesses Behavioral/Psych: Mood is stable, no new changes  All other systems were reviewed with the patient and are negative.  I have reviewed the past medical history, past surgical history, social history and family history with the patient and they are unchanged from previous note.  ALLERGIES:  is allergic to tape; latex; lidocaine; and sulfur.  MEDICATIONS:  Current Outpatient Prescriptions  Medication Sig  Dispense Refill  . acetaminophen (TYLENOL) 500 MG tablet Take 1,000 mg by mouth 2 (two) times daily.     Marland Kitchen aspirin EC 81 MG EC tablet Take 1 tablet (81 mg total) by mouth 2 (two) times daily after a meal. 60 tablet 1  . Calcium-Vitamin D (CALTRATE 600 PLUS-VIT D PO) Take 1 tablet by mouth 2 (two) times daily.     Marland Kitchen docusate sodium (COLACE) 100 MG capsule Take 100 mg by mouth 3 (three) times daily as needed for constipation.    Marland Kitchen estradiol (ESTRACE) 0.5 MG tablet Take 0.5 mg by mouth daily.  3  . lactobacillus acidophilus (BACID) TABS tablet Take 1 tablet by mouth 2 (two) times daily.    Marland Kitchen levothyroxine (SYNTHROID, LEVOTHROID) 200 MCG tablet Take 200 mcg by mouth daily before breakfast.    . medroxyPROGESTERone (PROVERA) 5 MG tablet Take 5 mg by mouth daily.  3  . Multiple Vitamins-Minerals (CENTRUM SILVER ULTRA WOMENS PO) Take by mouth.    . Multiple Vitamins-Minerals (PRESERVISION AREDS PO) Take 2 capsules by mouth daily.    . nebivolol (BYSTOLIC) 10 MG tablet Take 10 mg by mouth daily.    . nortriptyline (PAMELOR) 25 MG capsule Take 75 mg by mouth every morning.     Marland Kitchen omeprazole (PRILOSEC) 20 MG capsule Take 20 mg by mouth 2 (two) times daily.    . simvastatin (ZOCOR) 20 MG tablet Take 20 mg by mouth at bedtime.     . vitamin B-12 (CYANOCOBALAMIN) 1000 MCG tablet Take 1,000 mcg by mouth daily.    . vitamin C (ASCORBIC ACID) 500 MG tablet Take 500 mg by mouth daily.     . Vitamin D, Ergocalciferol, (DRISDOL) 50000 units CAPS capsule take ONE CAPSULE BY MOUTH every 7 DAYS 30 capsule 3  . vitamin  E 400 UNIT capsule Take 400 Units by mouth 2 (two) times daily.     Marland Kitchen zolpidem (AMBIEN) 10 MG tablet Take 1 tablet (10 mg total) by mouth at bedtime. 30 tablet 0   No current facility-administered medications for this visit.     PHYSICAL EXAMINATION: ECOG PERFORMANCE STATUS: 0 - Asymptomatic  Vitals:   07/07/16 1019  BP: 135/68  Pulse: 67  Resp: 18  Temp: 98.3 F (36.8 C)   Filed Weights    07/07/16 1019  Weight: 138 lb 12.8 oz (63 kg)    GENERAL:alert, no distress and comfortable SKIN: skin color, texture, turgor are normal, no rashes or significant lesions EYES: normal, Conjunctiva are pink and non-injected, sclera clear OROPHARYNX:no exudate, no erythema and lips, buccal mucosa, and tongue normal  NECK: supple, thyroid normal size, non-tender, without nodularity LYMPH:  no palpable lymphadenopathy in the cervical, axillary or inguinal LUNGS: clear to auscultation and percussion with normal breathing effort HEART: regular rate & rhythm and no murmurs and no lower extremity edema ABDOMEN:abdomen soft, non-tender and normal bowel sounds Musculoskeletal:no cyanosis of digits and no clubbing  NEURO: alert & oriented x 3 with fluent speech, no focal motor/sensory deficits  LABORATORY DATA:  I have reviewed the data as listed    Component Value Date/Time   NA 137 07/07/2015 1040   K 5.1 07/07/2015 1040   CL 102 06/01/2015 0659   CL 101 07/02/2012 0935   CO2 27 07/07/2015 1040   GLUCOSE 96 07/07/2015 1040   GLUCOSE 137 (H) 07/02/2012 0935   BUN 18.1 07/07/2015 1040   CREATININE 1.0 07/07/2015 1040   CALCIUM 9.6 07/07/2015 1040   PROT 7.4 07/07/2015 1040   ALBUMIN 3.5 07/07/2015 1040   AST 18 07/07/2015 1040   ALT 15 07/07/2015 1040   ALKPHOS 82 07/07/2015 1040   BILITOT 0.33 07/07/2015 1040   GFRNONAA >60 06/01/2015 0659   GFRAA >60 06/01/2015 0659    No results found for: SPEP, UPEP  Lab Results  Component Value Date   WBC 8.1 07/07/2016   NEUTROABS 5.4 07/07/2016   HGB 12.6 07/07/2016   HCT 39.1 07/07/2016   MCV 104.0 (H) 07/07/2016   PLT 251 07/07/2016      Chemistry      Component Value Date/Time   NA 137 07/07/2015 1040   K 5.1 07/07/2015 1040   CL 102 06/01/2015 0659   CL 101 07/02/2012 0935   CO2 27 07/07/2015 1040   BUN 18.1 07/07/2015 1040   CREATININE 1.0 07/07/2015 1040      Component Value Date/Time   CALCIUM 9.6 07/07/2015 1040    ALKPHOS 82 07/07/2015 1040   AST 18 07/07/2015 1040   ALT 15 07/07/2015 1040   BILITOT 0.33 07/07/2015 1040       ASSESSMENT & PLAN:  History of colon cancer, stage III She has been disease free for over 14 years. The patient wants to continue followup here and I will see her once a year with history, physical examination and blood work. She will continue close follow-up with GI for surveillance colonoscopy as indicated.  Macrocytosis without anemia I suspect this is related to recent changes in her thyroid medications. Her previous work-up with serum vitamin B12, hepatic panel and TSH were within normal limits.  There is no indication to proceed with bone marrow biopsy or further investigation at this point as she is not symptomatic.   Orders Placed This Encounter  Procedures  . CBC with Differential/Platelet  Standing Status:   Future    Standing Expiration Date:   08/11/2017  . Comprehensive metabolic panel    Standing Status:   Future    Standing Expiration Date:   08/11/2017  . CEA    Standing Status:   Future    Standing Expiration Date:   08/11/2017   All questions were answered. The patient knows to call the clinic with any problems, questions or concerns. No barriers to learning was detected. I spent 10 minutes counseling the patient face to face. The total time spent in the appointment was 15 minutes and more than 50% was on counseling and review of test results     Heath Lark, MD 07/07/2016 4:10 PM

## 2016-07-07 NOTE — Assessment & Plan Note (Signed)
I suspect this is related to recent changes in her thyroid medications. Her previous work-up with serum vitamin B12, hepatic panel and TSH were within normal limits.  There is no indication to proceed with bone marrow biopsy or further investigation at this point as she is not symptomatic.

## 2016-07-07 NOTE — Telephone Encounter (Signed)
Scheduled appt per 6/21 los - Gave patient AVS and calender per LOS. 

## 2016-07-07 NOTE — Assessment & Plan Note (Signed)
She has been disease free for over 14 years. The patient wants to continue followup here and I will see her once a year with history, physical examination and blood work. She will continue close follow-up with GI for surveillance colonoscopy as indicated.

## 2016-07-08 LAB — CEA (PARALLEL TESTING): CEA: 2.4 ng/mL

## 2016-08-23 ENCOUNTER — Ambulatory Visit: Payer: Medicare Other | Admitting: Nurse Practitioner

## 2016-11-08 ENCOUNTER — Other Ambulatory Visit: Payer: Self-pay | Admitting: Family Medicine

## 2016-11-08 DIAGNOSIS — Z1231 Encounter for screening mammogram for malignant neoplasm of breast: Secondary | ICD-10-CM

## 2016-12-14 ENCOUNTER — Ambulatory Visit
Admission: RE | Admit: 2016-12-14 | Discharge: 2016-12-14 | Disposition: A | Discharge status: Home / Self Care | Payer: Medicare Other | Source: Ambulatory Visit | Attending: Family Medicine | Admitting: Family Medicine

## 2016-12-14 DIAGNOSIS — Z1231 Encounter for screening mammogram for malignant neoplasm of breast: Secondary | ICD-10-CM

## 2017-02-24 ENCOUNTER — Other Ambulatory Visit (HOSPITAL_COMMUNITY): Payer: Self-pay | Admitting: Family Medicine

## 2017-02-24 DIAGNOSIS — R319 Hematuria, unspecified: Secondary | ICD-10-CM

## 2017-03-01 ENCOUNTER — Ambulatory Visit (HOSPITAL_COMMUNITY)
Admission: RE | Admit: 2017-03-01 | Discharge: 2017-03-01 | Disposition: A | Discharge status: Home / Self Care | Payer: Medicare Other | Source: Ambulatory Visit | Attending: Family Medicine | Admitting: Family Medicine

## 2017-03-01 DIAGNOSIS — R319 Hematuria, unspecified: Secondary | ICD-10-CM

## 2017-03-01 DIAGNOSIS — N3001 Acute cystitis with hematuria: Secondary | ICD-10-CM | POA: Insufficient documentation

## 2017-03-01 DIAGNOSIS — E875 Hyperkalemia: Secondary | ICD-10-CM | POA: Insufficient documentation

## 2017-06-13 MED FILL — PROLIA 60 MG/ML SOLN: 60 | 180 days supply | Qty: 1 | Fill #0

## 2017-06-20 ENCOUNTER — Other Ambulatory Visit (HOSPITAL_COMMUNITY)
Admission: RE | Admit: 2017-06-20 | Discharge: 2017-06-20 | Disposition: A | Discharge status: Home / Self Care | Payer: Medicare Other | Source: Ambulatory Visit | Attending: Urology | Admitting: Urology

## 2017-06-20 ENCOUNTER — Ambulatory Visit: Payer: Medicare Other | Admitting: Urology

## 2017-06-20 DIAGNOSIS — N3000 Acute cystitis without hematuria: Secondary | ICD-10-CM

## 2017-06-20 DIAGNOSIS — N952 Postmenopausal atrophic vaginitis: Secondary | ICD-10-CM

## 2017-06-20 DIAGNOSIS — R102 Pelvic and perineal pain: Secondary | ICD-10-CM | POA: Insufficient documentation

## 2017-06-20 DIAGNOSIS — R3121 Asymptomatic microscopic hematuria: Secondary | ICD-10-CM

## 2017-06-21 LAB — URINE CULTURE: Culture: NO GROWTH

## 2017-07-06 ENCOUNTER — Telehealth: Payer: Self-pay

## 2017-07-06 ENCOUNTER — Inpatient Hospital Stay: Payer: Medicare Other | Attending: Hematology and Oncology | Admitting: Hematology and Oncology

## 2017-07-06 ENCOUNTER — Telehealth: Payer: Self-pay | Admitting: Hematology and Oncology

## 2017-07-06 ENCOUNTER — Inpatient Hospital Stay: Payer: Medicare Other

## 2017-07-06 ENCOUNTER — Encounter: Payer: Self-pay | Admitting: Hematology and Oncology

## 2017-07-06 DIAGNOSIS — I10 Essential (primary) hypertension: Secondary | ICD-10-CM | POA: Diagnosis not present

## 2017-07-06 DIAGNOSIS — Z85038 Personal history of other malignant neoplasm of large intestine: Secondary | ICD-10-CM | POA: Insufficient documentation

## 2017-07-06 DIAGNOSIS — R102 Pelvic and perineal pain: Secondary | ICD-10-CM | POA: Insufficient documentation

## 2017-07-06 LAB — COMPREHENSIVE METABOLIC PANEL
ALBUMIN: 3.4 g/dL — AB (ref 3.5–5.0)
ALK PHOS: 86 U/L (ref 40–150)
ALT: 10 U/L (ref 0–55)
ANION GAP: 5 (ref 3–11)
AST: 15 U/L (ref 5–34)
BILIRUBIN TOTAL: 0.3 mg/dL (ref 0.2–1.2)
BUN: 18 mg/dL (ref 7–26)
CALCIUM: 9.3 mg/dL (ref 8.4–10.4)
CO2: 28 mmol/L (ref 22–29)
Chloride: 104 mmol/L (ref 98–109)
Creatinine, Ser: 1.09 mg/dL (ref 0.60–1.10)
GFR calc Af Amer: 53 mL/min — ABNORMAL LOW (ref >60)
GFR, EST NON AFRICAN AMERICAN: 46 mL/min — AB (ref >60)
GLUCOSE: 93 mg/dL (ref 70–140)
Potassium: 5.2 mmol/L — ABNORMAL HIGH (ref 3.5–5.1)
Sodium: 137 mmol/L (ref 136–145)
Total Protein: 7.6 g/dL (ref 6.4–8.3)

## 2017-07-06 LAB — CEA (IN HOUSE-CHCC): CEA (CHCC-IN HOUSE): 2.36 ng/mL (ref 0.00–5.00)

## 2017-07-06 LAB — CBC WITH DIFFERENTIAL/PLATELET
BASOS PCT: 0 %
Basophils Absolute: 0 10*3/uL (ref 0.0–0.1)
EOS PCT: 3 %
Eosinophils Absolute: 0.2 10*3/uL (ref 0.0–0.5)
HCT: 35.8 % (ref 34.8–46.6)
Hemoglobin: 11.8 g/dL (ref 11.6–15.9)
Lymphocytes Relative: 16 %
Lymphs Abs: 1.2 10*3/uL (ref 0.9–3.3)
MCH: 32.9 pg (ref 25.1–34.0)
MCHC: 33.1 g/dL (ref 31.5–36.0)
MCV: 99.4 fL (ref 79.5–101.0)
MONO ABS: 1 10*3/uL — AB (ref 0.1–0.9)
Monocytes Relative: 13 %
Neutro Abs: 5 10*3/uL (ref 1.5–6.5)
Neutrophils Relative %: 68 %
PLATELETS: 321 10*3/uL (ref 145–400)
RBC: 3.6 MIL/uL — AB (ref 3.70–5.45)
RDW: 12.5 % (ref 11.2–14.5)
WBC: 7.3 10*3/uL (ref 3.9–10.3)

## 2017-07-06 NOTE — Progress Notes (Signed)
[place] OFFICE PROGRESS NOTE  Patient Care Team: [name], MD as PCP - General (Family Medicine) Clarene Essex, MD as Consulting Physician (Gastroenterology) Herminio Commons, MD as Attending Physician (Cardiology)  ASSESSMENT & PLAN:  History of colon cancer, stage III Clinically, she has no signs or symptoms to suggest cancer recurrence We discussed discharge from cancer clinic due to her long-term cancer survivor status However, the patient would still want to come here once a year for further follow-up At this point, there is no real benefit for routine surveillance imaging or colonoscopy  Essential hypertension Her blood pressure is mildly elevated, could be due to mild anxiety She will continue close follow-up with primary care doctor for further evaluation and management  Suprapubic discomfort She has mild suprapubic discomfort and is currently undergoing urology evaluation due to recurrent urinary tract infection and hematuria She is currently on topical estrogen treatment for presumably atrophic vaginitis I will defer to the urologist for further management   Orders Placed This Encounter  Procedures  . [facility] (IN HOUSE-[facility])    INTERVAL HISTORY: Please see below for problem oriented charting. She returns for further follow-up She had recurrent urinary tract infection x3 since the last time I saw her She just completed another course of antibiotic treatment She has noted some occasional hematuria She is currently being evaluated by urologist She has concern about the cause of her topical estrogen cream that is prescribed for presumably atrophic vaginitis She denies recent nausea, changes in bowel habits, melena or hematochezia  SUMMARY OF ONCOLOGIC HISTORY:  I reviewed the patient's records extensive and collaborated the history with the patient. Summary of her history is as follows: She was diagnosed with stage IIIB pT3 N1 M0 adenocarcinoma  of the right colon after presentation was of the anemia. She had resection in September 2003.  She received adjuvant chemotherapy with 5-FU, irinotecan, leucovorin which finished in April 2004 by Dr. Burney Gauze.  Last surveillance colonoscopy with Dr. Watt Climes was reportedly negative in September 2012.  Her last CT scan of the abdomen and pelvis from November 15, 2012 showed no evidence of disease  REVIEW OF SYSTEMS:   Constitutional: Denies fevers, chills or abnormal weight loss Eyes: Denies blurriness of vision Ears, nose, mouth, throat, and face: Denies mucositis or sore throat Respiratory: Denies cough, dyspnea or wheezes Cardiovascular: Denies palpitation, chest discomfort or lower extremity swelling Gastrointestinal:  Denies nausea, heartburn or change in bowel habits Skin: Denies abnormal skin rashes Lymphatics: Denies new lymphadenopathy or easy bruising Neurological:Denies numbness, tingling or new weaknesses Behavioral/Psych: Mood is stable, no new changes  All other systems were reviewed with the patient and are negative.  I have reviewed the past medical history, past surgical history, social history and family history with the patient and they are unchanged from previous note.  ALLERGIES:  is allergic to tape; latex; lidocaine; and sulfur.  MEDICATIONS:  Current Outpatient Medications  Medication Sig Dispense Refill  . acetaminophen (TYLENOL 8 HOUR ARTHRITIS PAIN) 650 MG CR tablet Take 650 mg by mouth every 8 (eight) hours as needed for pain. Take 2 every 8 hours as needed    . amoxicillin (AMOXIL) 500 MG tablet Take 500 mg by mouth as needed. Take 4 tablets for dentist every 6 months    . aspirin EC 81 MG EC tablet Take 1 tablet (81 mg total) by mouth 2 (two) times daily after a meal. 60 tablet 1  . Calcium-Vitamin D (CALTRATE 600 PLUS-VIT D PO)  Take 1 tablet by mouth 2 (two) times daily.     Marland Kitchen docusate sodium (COLACE) 100 MG capsule Take 100 mg by mouth 3 (three) times daily  as needed for constipation.    Marland Kitchen estradiol (ESTRACE) 0.1 MG/GM vaginal cream Place 0.1 g vaginally 2 (two) times a week.    . estradiol (ESTRACE) 0.5 MG tablet Take 0.5 mg by mouth daily.  3  . lactobacillus acidophilus (BACID) TABS tablet Take 1 tablet by mouth 2 (two) times daily.    Marland Kitchen levothyroxine (SYNTHROID, LEVOTHROID) 200 MCG tablet Take 200 mcg by mouth daily before breakfast.    . medroxyPROGESTERone (PROVERA) 5 MG tablet Take 5 mg by mouth daily.  3  . Multiple Vitamins-Minerals (CENTRUM SILVER ULTRA WOMENS PO) Take by mouth.    . Multiple Vitamins-Minerals (PRESERVISION AREDS PO) Take 2 capsules by mouth daily.    . nebivolol (BYSTOLIC) 10 MG tablet Take 10 mg by mouth daily.    . nortriptyline (PAMELOR) 25 MG capsule Take 75 mg by mouth every morning.     Marland Kitchen omeprazole (PRILOSEC) 20 MG capsule Take 20 mg by mouth daily.     . simvastatin (ZOCOR) 20 MG tablet Take 20 mg by mouth at bedtime.     . vitamin B-12 (CYANOCOBALAMIN) 1000 MCG tablet Take 1,000 mcg by mouth daily.    . vitamin C (ASCORBIC ACID) 500 MG tablet Take 500 mg by mouth daily.     . Vitamin D, Ergocalciferol, (DRISDOL) 50000 units CAPS capsule take ONE CAPSULE BY MOUTH every 7 DAYS (Patient taking differently: take ONE CAPSULE BY MOUTH every 14 days) 30 capsule 3  . vitamin E 400 UNIT capsule Take 400 Units by mouth 2 (two) times daily.     Marland Kitchen zolpidem (AMBIEN) 10 MG tablet Take 1 tablet (10 mg total) by mouth at bedtime. 30 tablet 0   No current facility-administered medications for this visit.     PHYSICAL EXAMINATION: ECOG PERFORMANCE STATUS: 1 - Symptomatic but completely ambulatory  Vitals:   07/06/17 1101  BP: (!) 172/74  Pulse: 65  Resp: 18  Temp: 97.9 F (36.6 C)  SpO2: 96%   There were no vitals filed for this visit.  GENERAL:alert, no distress and comfortable SKIN: skin color, texture, turgor are normal, no rashes or significant lesions EYES: normal, Conjunctiva are pink and non-injected,  sclera clear OROPHARYNX:no exudate, no erythema and lips, buccal mucosa, and tongue normal  NECK: supple, thyroid normal size, non-tender, without nodularity LYMPH:  no palpable lymphadenopathy in the cervical, axillary or inguinal LUNGS: clear to auscultation and percussion with normal breathing effort HEART: regular rate & rhythm and no murmurs and no lower extremity edema ABDOMEN:abdomen soft, with mild suprapubic discomfort, normal bowel sounds Musculoskeletal:no cyanosis of digits and no clubbing  NEURO: alert & oriented x 3 with fluent speech, no focal motor/sensory deficits  LABORATORY DATA:  I have reviewed the data as listed    Component Value Date/Time   NA 137 07/06/2017 1011   NA 137 07/07/2015 1040   K 5.2 (H) 07/06/2017 1011   K 5.1 07/07/2015 1040   CL 104 07/06/2017 1011   CL 101 07/02/2012 0935   CO2 28 07/06/2017 1011   CO2 27 07/07/2015 1040   GLUCOSE 93 07/06/2017 1011   GLUCOSE 96 07/07/2015 1040   GLUCOSE 137 (H) 07/02/2012 0935   BUN 18 07/06/2017 1011   BUN 18.1 07/07/2015 1040   CREATININE 1.09 07/06/2017 1011   CREATININE 1.0 07/07/2015 1040  CALCIUM 9.3 07/06/2017 1011   CALCIUM 9.6 07/07/2015 1040   PROT 7.6 07/06/2017 1011   PROT 7.4 07/07/2015 1040   ALBUMIN 3.4 (L) 07/06/2017 1011   ALBUMIN 3.5 07/07/2015 1040   AST 15 07/06/2017 1011   AST 18 07/07/2015 1040   ALT 10 07/06/2017 1011   ALT 15 07/07/2015 1040   ALKPHOS 86 07/06/2017 1011   ALKPHOS 82 07/07/2015 1040   BILITOT 0.3 07/06/2017 1011   BILITOT 0.33 07/07/2015 1040   GFRNONAA 46 (L) 07/06/2017 1011   GFRAA 53 (L) 07/06/2017 1011    No results found for: SPEP, UPEP  Lab Results  Component Value Date   WBC 7.3 07/06/2017   NEUTROABS 5.0 07/06/2017   HGB 11.8 07/06/2017   HCT 35.8 07/06/2017   MCV 99.4 07/06/2017   PLT 321 07/06/2017      Chemistry      Component Value Date/Time   NA 137 07/06/2017 1011   NA 137 07/07/2015 1040   K 5.2 (H) 07/06/2017 1011   K 5.1  07/07/2015 1040   CL 104 07/06/2017 1011   CL 101 07/02/2012 0935   CO2 28 07/06/2017 1011   CO2 27 07/07/2015 1040   BUN 18 07/06/2017 1011   BUN 18.1 07/07/2015 1040   CREATININE 1.09 07/06/2017 1011   CREATININE 1.0 07/07/2015 1040      Component Value Date/Time   CALCIUM 9.3 07/06/2017 1011   CALCIUM 9.6 07/07/2015 1040   ALKPHOS 86 07/06/2017 1011   ALKPHOS 82 07/07/2015 1040   AST 15 07/06/2017 1011   AST 18 07/07/2015 1040   ALT 10 07/06/2017 1011   ALT 15 07/07/2015 1040   BILITOT 0.3 07/06/2017 1011   BILITOT 0.33 07/07/2015 1040      All questions were answered. The patient knows to call the clinic with any problems, questions or concerns. No barriers to learning was detected.  I spent 15 minutes counseling the patient face to face. The total time spent in the appointment was 20 minutes and more than 50% was on counseling and review of test results  Heath Lark, MD 07/06/2017 11:32 AM

## 2017-07-06 NOTE — Telephone Encounter (Signed)
Called and left a message to call nurse back regarding lab results today.

## 2017-07-06 NOTE — Assessment & Plan Note (Signed)
Her blood pressure is mildly elevated, could be due to mild anxiety She will continue close follow-up with primary care doctor for further evaluation and management

## 2017-07-06 NOTE — Assessment & Plan Note (Signed)
She has mild suprapubic discomfort and is currently undergoing urology evaluation due to recurrent urinary tract infection and hematuria She is currently on topical estrogen treatment for presumably atrophic vaginitis I will defer to the urologist for further management

## 2017-07-06 NOTE — Telephone Encounter (Signed)
Gave patient avs and calendar of upcoming June 2020 appointments.  °

## 2017-07-06 NOTE — Assessment & Plan Note (Signed)
Clinically, she has no signs or symptoms to suggest cancer recurrence We discussed discharge from cancer clinic due to her long-term cancer survivor status However, the patient would still want to come here once a year for further follow-up At this point, there is no real benefit for routine surveillance imaging or colonoscopy

## 2017-07-07 NOTE — Telephone Encounter (Signed)
Called and left a message per Dr. Alvy Bimler that her labs from yesterday looked okay. Instructed to call for questions.

## 2017-08-01 ENCOUNTER — Other Ambulatory Visit (HOSPITAL_COMMUNITY)
Admission: AD | Admit: 2017-08-01 | Discharge: 2017-08-01 | Disposition: A | Discharge status: Home / Self Care | Payer: Medicare Other | Source: Skilled Nursing Facility | Attending: Family Medicine | Admitting: Family Medicine

## 2017-08-01 ENCOUNTER — Ambulatory Visit: Payer: Medicare Other | Admitting: Urology

## 2017-08-01 DIAGNOSIS — R310 Gross hematuria: Secondary | ICD-10-CM

## 2017-08-01 DIAGNOSIS — N952 Postmenopausal atrophic vaginitis: Secondary | ICD-10-CM

## 2017-08-01 DIAGNOSIS — N9489 Other specified conditions associated with female genital organs and menstrual cycle: Secondary | ICD-10-CM

## 2017-08-03 LAB — URINE CULTURE: Culture: NO GROWTH

## 2017-11-08 ENCOUNTER — Other Ambulatory Visit: Payer: Self-pay | Admitting: Family Medicine

## 2017-11-08 DIAGNOSIS — Z1231 Encounter for screening mammogram for malignant neoplasm of breast: Secondary | ICD-10-CM

## 2017-12-20 ENCOUNTER — Other Ambulatory Visit (HOSPITAL_COMMUNITY): Payer: Self-pay | Admitting: Family Medicine

## 2017-12-20 ENCOUNTER — Ambulatory Visit (HOSPITAL_COMMUNITY)
Admission: RE | Admit: 2017-12-20 | Discharge: 2017-12-20 | Disposition: A | Discharge status: Home / Self Care | Payer: Medicare Other | Source: Ambulatory Visit | Attending: Family Medicine | Admitting: Family Medicine

## 2017-12-20 DIAGNOSIS — R634 Abnormal weight loss: Secondary | ICD-10-CM

## 2017-12-27 ENCOUNTER — Other Ambulatory Visit (HOSPITAL_COMMUNITY): Payer: Self-pay | Admitting: Family Medicine

## 2017-12-27 DIAGNOSIS — R0602 Shortness of breath: Secondary | ICD-10-CM

## 2017-12-27 DIAGNOSIS — R634 Abnormal weight loss: Secondary | ICD-10-CM

## 2017-12-28 ENCOUNTER — Ambulatory Visit (HOSPITAL_COMMUNITY)
Admission: RE | Admit: 2017-12-28 | Discharge: 2017-12-28 | Disposition: A | Discharge status: Home / Self Care | Payer: Medicare Other | Source: Ambulatory Visit | Attending: Family Medicine | Admitting: Family Medicine

## 2017-12-28 ENCOUNTER — Other Ambulatory Visit (HOSPITAL_COMMUNITY)
Admission: RE | Admit: 2017-12-28 | Discharge: 2017-12-28 | Disposition: A | Discharge status: Home / Self Care | Payer: Medicare Other | Source: Ambulatory Visit | Attending: Family Medicine | Admitting: Family Medicine

## 2017-12-28 DIAGNOSIS — R0602 Shortness of breath: Secondary | ICD-10-CM | POA: Insufficient documentation

## 2017-12-28 LAB — BRAIN NATRIURETIC PEPTIDE: B Natriuretic Peptide: 46 pg/mL (ref 0.0–100.0)

## 2017-12-28 LAB — D-DIMER, QUANTITATIVE: D-Dimer, Quant: 0.66 ug{FEU}/mL — ABNORMAL HIGH (ref 0.00–0.50)

## 2017-12-28 NOTE — Progress Notes (Signed)
*  PRELIMINARY RESULTS* Echocardiogram 2D Echocardiogram has been performed.  [name] 12/28/2017, 1:41 PM

## 2018-01-01 ENCOUNTER — Ambulatory Visit (HOSPITAL_COMMUNITY)
Admission: RE | Admit: 2018-01-01 | Discharge: 2018-01-01 | Disposition: A | Discharge status: Home / Self Care | Payer: Medicare Other | Source: Ambulatory Visit | Attending: Family Medicine | Admitting: Family Medicine

## 2018-01-01 DIAGNOSIS — R634 Abnormal weight loss: Secondary | ICD-10-CM | POA: Diagnosis not present

## 2018-01-01 DIAGNOSIS — R0602 Shortness of breath: Secondary | ICD-10-CM | POA: Insufficient documentation

## 2018-01-03 ENCOUNTER — Other Ambulatory Visit (HOSPITAL_COMMUNITY): Payer: Self-pay | Admitting: Family Medicine

## 2018-01-03 ENCOUNTER — Ambulatory Visit (HOSPITAL_COMMUNITY)
Admission: RE | Admit: 2018-01-03 | Discharge: 2018-01-03 | Disposition: A | Discharge status: Home / Self Care | Payer: Medicare Other | Source: Ambulatory Visit | Attending: Family Medicine | Admitting: Family Medicine

## 2018-01-03 DIAGNOSIS — C642 Malignant neoplasm of left kidney, except renal pelvis: Secondary | ICD-10-CM | POA: Diagnosis present

## 2018-01-03 MED ORDER — IOPAMIDOL (ISOVUE-300) INJECTION 61%
100.0000 mL | Freq: Once | INTRAVENOUS | Status: AC | PRN
  Administered 2018-01-03: 80 mL via INTRAVENOUS

## 2018-01-05 ENCOUNTER — Other Ambulatory Visit: Payer: Self-pay | Admitting: Physician Assistant

## 2018-01-05 DIAGNOSIS — R1310 Dysphagia, unspecified: Secondary | ICD-10-CM

## 2018-01-08 ENCOUNTER — Ambulatory Visit
Admission: RE | Admit: 2018-01-08 | Discharge: 2018-01-08 | Disposition: A | Discharge status: Home / Self Care | Payer: Medicare Other | Source: Ambulatory Visit | Attending: Family Medicine | Admitting: Family Medicine

## 2018-01-08 DIAGNOSIS — Z1231 Encounter for screening mammogram for malignant neoplasm of breast: Secondary | ICD-10-CM

## 2018-01-09 ENCOUNTER — Other Ambulatory Visit (HOSPITAL_COMMUNITY): Payer: Self-pay | Admitting: Family Medicine

## 2018-01-09 ENCOUNTER — Telehealth: Payer: Self-pay

## 2018-01-09 DIAGNOSIS — R634 Abnormal weight loss: Secondary | ICD-10-CM

## 2018-01-09 DIAGNOSIS — C642 Malignant neoplasm of left kidney, except renal pelvis: Secondary | ICD-10-CM

## 2018-01-09 NOTE — Telephone Encounter (Signed)
Clarise Cruz at Dr. Vickey Sages office called requesting a earlier appt. She recently had a CT abdomen and pelvis that is worrisome left kidney malignancy.

## 2018-01-09 NOTE — Telephone Encounter (Signed)
She is best seen by [name] or Urology office Shirlean Mylar, can you help me facilitate the referral?

## 2018-01-12 ENCOUNTER — Encounter: Payer: Self-pay | Admitting: Oncology

## 2018-01-12 ENCOUNTER — Telehealth: Payer: Self-pay | Admitting: Oncology

## 2018-01-12 NOTE — Telephone Encounter (Signed)
Dr. Alvy Bimler has referred the pt to [name] for kidney cancer. Pt has been scheduled to see [name] on 1/16 at 11am. Letter mailed.

## 2018-01-16 ENCOUNTER — Ambulatory Visit
Admission: RE | Admit: 2018-01-16 | Discharge: 2018-01-16 | Disposition: A | Discharge status: Home / Self Care | Payer: Medicare Other | Source: Ambulatory Visit | Attending: Physician Assistant | Admitting: Physician Assistant

## 2018-01-16 DIAGNOSIS — R1310 Dysphagia, unspecified: Secondary | ICD-10-CM

## 2018-01-18 ENCOUNTER — Ambulatory Visit (HOSPITAL_COMMUNITY)
Admission: RE | Admit: 2018-01-18 | Discharge: 2018-01-18 | Disposition: A | Discharge status: Home / Self Care | Payer: Medicare Other | Source: Ambulatory Visit | Attending: Family Medicine | Admitting: Family Medicine

## 2018-01-18 DIAGNOSIS — C642 Malignant neoplasm of left kidney, except renal pelvis: Secondary | ICD-10-CM | POA: Diagnosis present

## 2018-01-18 DIAGNOSIS — R634 Abnormal weight loss: Secondary | ICD-10-CM

## 2018-01-18 LAB — GLUCOSE, CAPILLARY: Glucose-Capillary: 107 mg/dL — ABNORMAL HIGH (ref 70–99)

## 2018-01-18 MED ORDER — FLUDEOXYGLUCOSE F - 18 (FDG) INJECTION
6.6000 | Freq: Once | INTRAVENOUS | Status: AC | PRN
  Administered 2018-01-18: 6.6 via INTRAVENOUS

## 2018-01-30 ENCOUNTER — Ambulatory Visit: Payer: Medicare Other | Admitting: Urology

## 2018-01-30 DIAGNOSIS — C642 Malignant neoplasm of left kidney, except renal pelvis: Secondary | ICD-10-CM

## 2018-01-31 ENCOUNTER — Other Ambulatory Visit: Payer: Self-pay

## 2018-01-31 ENCOUNTER — Emergency Department (HOSPITAL_COMMUNITY)
Admission: EM | Admit: 2018-01-31 | Discharge: 2018-02-01 | Disposition: A | Discharge status: Home / Self Care | Payer: Medicare Other | Attending: Emergency Medicine | Admitting: Emergency Medicine

## 2018-01-31 ENCOUNTER — Encounter (HOSPITAL_COMMUNITY): Payer: Self-pay | Admitting: Emergency Medicine

## 2018-01-31 DIAGNOSIS — E119 Type 2 diabetes mellitus without complications: Secondary | ICD-10-CM | POA: Insufficient documentation

## 2018-01-31 DIAGNOSIS — Z96651 Presence of right artificial knee joint: Secondary | ICD-10-CM | POA: Insufficient documentation

## 2018-01-31 DIAGNOSIS — Z7982 Long term (current) use of aspirin: Secondary | ICD-10-CM | POA: Diagnosis not present

## 2018-01-31 DIAGNOSIS — Z91040 Latex allergy status: Secondary | ICD-10-CM | POA: Diagnosis not present

## 2018-01-31 DIAGNOSIS — E039 Hypothyroidism, unspecified: Secondary | ICD-10-CM | POA: Insufficient documentation

## 2018-01-31 DIAGNOSIS — I10 Essential (primary) hypertension: Secondary | ICD-10-CM | POA: Insufficient documentation

## 2018-01-31 DIAGNOSIS — Z96641 Presence of right artificial hip joint: Secondary | ICD-10-CM | POA: Insufficient documentation

## 2018-01-31 DIAGNOSIS — Z79899 Other long term (current) drug therapy: Secondary | ICD-10-CM | POA: Diagnosis not present

## 2018-01-31 DIAGNOSIS — R040 Epistaxis: Secondary | ICD-10-CM | POA: Diagnosis present

## 2018-01-31 MED ORDER — OXYMETAZOLINE HCL 0.05 % NA SOLN
1.0000 | Freq: Once | NASAL | Status: AC
  Administered 2018-02-01: 1 via NASAL
  Filled 2018-01-31: qty 15

## 2018-01-31 NOTE — ED Triage Notes (Signed)
Pt states she bent over at home and nose started to bleed. Per ems pt's blood pressure was 203/105.

## 2018-02-01 ENCOUNTER — Inpatient Hospital Stay: Payer: Medicare Other | Attending: Oncology | Admitting: Oncology

## 2018-02-01 ENCOUNTER — Telehealth: Payer: Self-pay | Admitting: Oncology

## 2018-02-01 VITALS — BP 139/81 | HR 88 | Resp 18 | Ht 66.0 in | Wt 127.2 lb

## 2018-02-01 DIAGNOSIS — N289 Disorder of kidney and ureter, unspecified: Secondary | ICD-10-CM

## 2018-02-01 DIAGNOSIS — I10 Essential (primary) hypertension: Secondary | ICD-10-CM

## 2018-02-01 DIAGNOSIS — E119 Type 2 diabetes mellitus without complications: Secondary | ICD-10-CM

## 2018-02-01 DIAGNOSIS — Z85038 Personal history of other malignant neoplasm of large intestine: Secondary | ICD-10-CM | POA: Diagnosis not present

## 2018-02-01 DIAGNOSIS — Z807 Family history of other malignant neoplasms of lymphoid, hematopoietic and related tissues: Secondary | ICD-10-CM

## 2018-02-01 DIAGNOSIS — R040 Epistaxis: Secondary | ICD-10-CM

## 2018-02-01 DIAGNOSIS — D49519 Neoplasm of unspecified behavior of unspecified kidney: Secondary | ICD-10-CM

## 2018-02-01 DIAGNOSIS — R634 Abnormal weight loss: Secondary | ICD-10-CM | POA: Diagnosis not present

## 2018-02-01 LAB — CBC WITH DIFFERENTIAL/PLATELET
Abs Immature Granulocytes: 0.07 10*3/uL (ref 0.00–0.07)
Abs Immature Granulocytes: 0.08 10*3/uL — ABNORMAL HIGH (ref 0.00–0.07)
BASOS ABS: 0 10*3/uL (ref 0.0–0.1)
Basophils Absolute: 0 10*3/uL (ref 0.0–0.1)
Basophils Relative: 0 %
Basophils Relative: 0 %
Eosinophils Absolute: 0.1 10*3/uL (ref 0.0–0.5)
Eosinophils Absolute: 0.1 10*3/uL (ref 0.0–0.5)
Eosinophils Relative: 1 %
Eosinophils Relative: 1 %
HCT: 30.5 % — ABNORMAL LOW (ref 36.0–46.0)
HCT: 30.4 % — ABNORMAL LOW (ref 36.0–46.0)
Hemoglobin: 9.2 g/dL — ABNORMAL LOW (ref 12.0–15.0)
Hemoglobin: 9.3 g/dL — ABNORMAL LOW (ref 12.0–15.0)
Immature Granulocytes: 1 %
Immature Granulocytes: 1 %
Lymphocytes Relative: 10 %
Lymphocytes Relative: 9 %
Lymphs Abs: 1.1 10*3/uL (ref 0.7–4.0)
Lymphs Abs: 1.1 10*3/uL (ref 0.7–4.0)
MCH: 30.3 pg (ref 26.0–34.0)
MCH: 30.7 pg (ref 26.0–34.0)
MCHC: 30.2 g/dL (ref 30.0–36.0)
MCHC: 30.6 g/dL (ref 30.0–36.0)
MCV: 100.3 fL — ABNORMAL HIGH (ref 80.0–100.0)
MCV: 100.3 fL — ABNORMAL HIGH (ref 80.0–100.0)
Monocytes Absolute: 1.2 10*3/uL — ABNORMAL HIGH (ref 0.1–1.0)
Monocytes Absolute: 1.4 10*3/uL — ABNORMAL HIGH (ref 0.1–1.0)
Monocytes Relative: 11 %
Monocytes Relative: 11 %
NRBC: 0 % (ref 0.0–0.2)
Neutro Abs: 8.5 10*3/uL — ABNORMAL HIGH (ref 1.7–7.7)
Neutro Abs: 9.5 10*3/uL — ABNORMAL HIGH (ref 1.7–7.7)
Neutrophils Relative %: 77 %
Neutrophils Relative %: 78 %
PLATELETS: 472 10*3/uL — AB (ref 150–400)
Platelets: 494 10*3/uL — ABNORMAL HIGH (ref 150–400)
RBC: 3.04 MIL/uL — ABNORMAL LOW (ref 3.87–5.11)
RBC: 3.03 MIL/uL — ABNORMAL LOW (ref 3.87–5.11)
RDW: 13.1 % (ref 11.5–15.5)
RDW: 13.1 % (ref 11.5–15.5)
WBC: 11 10*3/uL — ABNORMAL HIGH (ref 4.0–10.5)
WBC: 12.2 10*3/uL — ABNORMAL HIGH (ref 4.0–10.5)
nRBC: 0 % (ref 0.0–0.2)

## 2018-02-01 LAB — BASIC METABOLIC PANEL
ANION GAP: 8 (ref 5–15)
BUN: 21 mg/dL (ref 8–23)
CO2: 27 mmol/L (ref 22–32)
Calcium: 8.9 mg/dL (ref 8.9–10.3)
Chloride: 101 mmol/L (ref 98–111)
Creatinine, Ser: 0.89 mg/dL (ref 0.44–1.00)
GFR calc non Af Amer: 60 mL/min — ABNORMAL LOW (ref >60)
Glucose, Bld: 181 mg/dL — ABNORMAL HIGH (ref 70–99)
POTASSIUM: 4.7 mmol/L (ref 3.5–5.1)
Sodium: 136 mmol/L (ref 135–145)

## 2018-02-01 LAB — PROTIME-INR
INR: 1.08
Prothrombin Time: 13.9 seconds (ref 11.4–15.2)

## 2018-02-01 LAB — APTT: aPTT: 39 seconds — ABNORMAL HIGH (ref 24–36)

## 2018-02-01 MED ORDER — TRANEXAMIC ACID 1000 MG/10ML IV SOLN
500.0000 mg | Freq: Once | INTRAVENOUS | Status: DC
  Filled 2018-02-01: qty 10

## 2018-02-01 MED ORDER — TRAMADOL HCL 50 MG PO TABS: 50.0000 mg | ORAL_TABLET | Freq: Four times a day (QID) | ORAL | 0 refills | Status: DC | PRN

## 2018-02-01 MED ORDER — AMOXICILLIN 500 MG PO CAPS: 500.0000 mg | ORAL_CAPSULE | Freq: Three times a day (TID) | ORAL | 0 refills | Status: DC

## 2018-02-01 NOTE — Progress Notes (Signed)
Reason for the request:    Kidney neoplasm  HPI: I was asked by Dr. Karie Kirks to evaluate [name] for new kidney mass.  She is an 85 year old woman with a history of colon cancer diagnosed in 2003 without any evidence of recurrence since that time.  She started developing abdominal discomfort and chest pain and underwent a CT scan on 01/01/2018 which showed a 5.6 x 5.2 upper pole left renal mass suspicious for neoplasm.  A 2.1 x 1.8 cm left adrenal mass was also noted.  CT scan of the abdomen and pelvis on 01/03/2018 confirmed the presence of a 5.8 cm mass in the left kidney as well as an interval increase in a well-defined calcified mesenteric mass noted in a PET CT scan was recommended.  CT scan obtained on January 18, 2018 showed hypermetabolic activity in the left kidney mass and adrenal metastasis.  There was 1 area of hypermetabolic lesion in the liver that is not detected on CT scan.  Clinically, she has reported weight loss and periodic chest discomfort and did develop an episode of epistaxis and was seen in emergency department overnight.  She has a nasal packing at this time to be potentially removed in the next few days.  She is using nutritional shakes and supplements and ambulating with the help of a cane.  She denies any recent falls or syncope.  She does not report any headaches, blurry vision, syncope or seizures. Does not report any fevers, chills or sweats.  Does not report any cough, wheezing or hemoptysis.  Does not report any chest pain, palpitation, orthopnea or leg edema.  Does not report any nausea, vomiting or abdominal pain.  Does not report any constipation or diarrhea.  Does not report any skeletal complaints.    Does not report frequency, urgency or hematuria.  Does not report any skin rashes or lesions. Does not report any heat or cold intolerance.  Does not report any lymphadenopathy or petechiae.  Does not report any anxiety or depression.  Remaining review of systems is  negative.    Past Medical History:  Diagnosis Date  . Abnormal uterine bleeding (AUB)    on cont HRT  . Anemia    hx of in 2003   . Arthritis   . Blood transfusion without reported diagnosis   . Calcification of aorta (HCC) 11/16/2012  . Choledocholithiasis with obstruction 11/16/2012  . Colon cancer [facility])    s/p resecton of colon in 2003 , hx of chemo  . Colon cancer ([place])   . Compression fracture jan 2012   due to fall  . Depression   . DJD (degenerative joint disease)   . DM II (diabetes mellitus, type II), controlled 11/16/2012  . Fibromyalgia   . GERD (gastroesophageal reflux disease)   . Goiter   . Headache(784.0)    tension headache daily per pt   . Headaches, cluster   . Heart murmur   . Hiatal hernia   . Hypertension   . Hypothyroid   . Kidney cancer, primary, with metastasis from kidney to other site [facility])   . Macular degeneration   . Osteoarthritis   . Pancreatitis, acute 11/15/2012  . Peripheral vascular disease ([place])    varicose veins in left arm   . PONV (postoperative nausea and vomiting)   . Shortness of breath    with exertion   . TMJ (dislocation of temporomandibular joint)   :  Past Surgical History:  Procedure Laterality Date  . APPENDECTOMY    .  BACK SURGERY  2000   lower  . CHOLECYSTECTOMY N/A 11/17/2012   Procedure: LAPAROSCOPIC CHOLECYSTECTOMY;  Surgeon: Leighton Ruff, MD;  Location: [facility];  Service: General;  Laterality: N/A;  . COLON RESECTION  2003  . COLONOSCOPY W/ BIOPSIES  09/25/2002, 09/18/2001  . DILATION AND CURETTAGE OF UTERUS    . ERCP N/A 11/14/2012   Procedure: ENDOSCOPIC RETROGRADE CHOLANGIOPANCREATOGRAPHY (ERCP);  Surgeon: Jeryl Columbia, MD;  Location: [facility];  Service: Endoscopy;  Laterality: N/A;  . ESOPHAGOGASTRODUODENOSCOPY ENDOSCOPY    . EYE SURGERY     bilateral cataract surgery   . HYSTEROSCOPY    . JOINT REPLACEMENT Right dec 2012   knee  . porta catheter placement  10/30/2001  . porta catheter removal   06/27/2003  . TMJ ARTHROPLASTY  1990's  . TMJ ARTHROPLASTY    . TONSILLECTOMY  age 52 or 12  . TOTAL HIP ARTHROPLASTY Right 05/30/2015   Procedure: TOTAL HIP ARTHROPLASTY ANTERIOR APPROACH;  Surgeon: Rod Can, MD;  Location: [facility];  Service: Orthopedics;  Laterality: Right;  . TOTAL KNEE ARTHROPLASTY  12/20/2010   Procedure: TOTAL KNEE ARTHROPLASTY;  Surgeon: Mauri Pole;  Location: [facility];  Service: Orthopedics;  Laterality: Right;  . UPPER GASTROINTESTINAL ENDOSCOPY  09/18/2001  . VERTEBROPLASTY  2012  :   Current Outpatient Medications:  .  acetaminophen (TYLENOL 8 HOUR ARTHRITIS PAIN) 650 MG CR tablet, Take 650 mg by mouth every 8 (eight) hours as needed for pain. Take 2 every 8 hours as needed, Disp: , Rfl:  .  amoxicillin (AMOXIL) 500 MG capsule, Take 1 capsule (500 mg total) by mouth 3 (three) times daily., Disp: 30 capsule, Rfl: 0 .  aspirin EC 81 MG EC tablet, Take 1 tablet (81 mg total) by mouth 2 (two) times daily after a meal., Disp: 60 tablet, Rfl: 1 .  Calcium-Vitamin D (CALTRATE 600 PLUS-VIT D PO), Take 1 tablet by mouth 2 (two) times daily. , Disp: , Rfl:  .  docusate sodium (COLACE) 100 MG capsule, Take 100 mg by mouth 3 (three) times daily as needed for constipation., Disp: , Rfl:  .  estradiol (ESTRACE) 0.1 MG/GM vaginal cream, Place 0.1 g vaginally 2 (two) times a week., Disp: , Rfl:  .  estradiol (ESTRACE) 0.5 MG tablet, Take 0.5 mg by mouth daily., Disp: , Rfl: 3 .  lactobacillus acidophilus (BACID) TABS tablet, Take 1 tablet by mouth 2 (two) times daily., Disp: , Rfl:  .  levothyroxine (SYNTHROID, LEVOTHROID) 200 MCG tablet, Take 200 mcg by mouth daily before breakfast., Disp: , Rfl:  .  medroxyPROGESTERone (PROVERA) 5 MG tablet, Take 5 mg by mouth daily., Disp: , Rfl: 3 .  Multiple Vitamins-Minerals (CENTRUM SILVER ULTRA WOMENS PO), Take by mouth., Disp: , Rfl:  .  Multiple Vitamins-Minerals (PRESERVISION AREDS PO), Take 2 capsules by mouth daily., Disp: , Rfl:  .   nebivolol (BYSTOLIC) 10 MG tablet, Take 10 mg by mouth daily., Disp: , Rfl:  .  nortriptyline (PAMELOR) 25 MG capsule, Take 75 mg by mouth every morning. , Disp: , Rfl:  .  omeprazole (PRILOSEC) 20 MG capsule, Take 20 mg by mouth daily. , Disp: , Rfl:  .  simvastatin (ZOCOR) 20 MG tablet, Take 20 mg by mouth at bedtime. , Disp: , Rfl:  .  traMADol (ULTRAM) 50 MG tablet, Take 1 tablet (50 mg total) by mouth every 6 (six) hours as needed., Disp: 16 tablet, Rfl: 0 .  vitamin B-12 (CYANOCOBALAMIN) 1000 MCG  tablet, Take 1,000 mcg by mouth daily., Disp: , Rfl:  .  vitamin C (ASCORBIC ACID) 500 MG tablet, Take 500 mg by mouth daily. , Disp: , Rfl:  .  Vitamin D, Ergocalciferol, (DRISDOL) 50000 units CAPS capsule, take ONE CAPSULE BY MOUTH every 7 DAYS (Patient taking differently: take ONE CAPSULE BY MOUTH every 14 days), Disp: 30 capsule, Rfl: 3 .  vitamin E 400 UNIT capsule, Take 400 Units by mouth 2 (two) times daily. , Disp: , Rfl:  .  zolpidem (AMBIEN) 10 MG tablet, Take 1 tablet (10 mg total) by mouth at bedtime., Disp: 30 tablet, Rfl: 0:  Allergies  Allergen Reactions  . Tape Rash    Blisters, nausea, and vomitting  . Latex Other (See Comments)    blister  . Lidocaine Nausea And Vomiting  . Sulfur Nausea And Vomiting  :  Family History  Problem Relation Age of Onset  . Heart disease Mother        Enlarged heart  . Arrhythmia Mother   . Diabetes Mother   . Heart disease Brother        MI at unknown age  . Anesthesia problems Brother   . Heart disease Father   . Cancer Son        non-hodgkins lymphoma  . Stroke Sister   . Heart attack Other        MI at age 26  . Heart disease Brother        CABG  :  Social History   Socioeconomic History  . Marital status: Single    Spouse name: Not on file  . Number of children: 1  . Years of education: Not on file  . Highest education level: Not on file  Occupational History    Comment: retired  Scientific laboratory technician  . Financial resource  strain: Not on file  . Food insecurity:    Worry: Not on file    Inability: Not on file  . Transportation needs:    Medical: Not on file    Non-medical: Not on file  Tobacco Use  . Smoking status: Never Smoker  . Smokeless tobacco: Never Used  Substance and Sexual Activity  . Alcohol use: No  . Drug use: No  . Sexual activity: Never    Birth control/protection: Post-menopausal  Lifestyle  . Physical activity:    Days per week: Not on file    Minutes per session: Not on file  . Stress: Not on file  Relationships  . Social connections:    Talks on phone: Not on file    Gets together: Not on file    Attends religious service: Not on file    Active member of club or organization: Not on file    Attends meetings of clubs or organizations: Not on file    Relationship status: Not on file  . Intimate partner violence:    Fear of current or ex partner: Not on file    Emotionally abused: Not on file    Physically abused: Not on file    Forced sexual activity: Not on file  Other Topics Concern  . Not on file  Social History Narrative  . Not on file  :  Pertinent items are noted in HPI.  Exam: Blood pressure 139/81, pulse 88, resp. rate 18, height 5\' 6"  (1.676 m), weight 127 lb 3.2 oz (57.7 kg), last menstrual period 01/17/1994, SpO2 99 %. ECOG 1  General appearance: alert and cooperative appeared without distress.  Head: atraumatic without any abnormalities. Eyes: conjunctivae/corneas clear. PERRL.  Sclera anicteric. Throat: lips, mucosa, and tongue normal; without oral thrush or ulcers. Resp: clear to auscultation bilaterally without rhonchi, wheezes or dullness to percussion. Cardio: regular rate and rhythm, S1, S2 normal, no murmur, click, rub or gallop GI: soft, non-tender; bowel sounds normal; no masses,  no organomegaly Skin: Skin color, texture, turgor normal. No rashes or lesions Lymph nodes: Cervical, supraclavicular, and axillary nodes normal. Neurologic: Grossly  normal without any motor, sensory or deep tendon reflexes. Musculoskeletal: No joint deformity or effusion.  Recent Labs    02/01/18 0111 02/01/18 0309  WBC 11.0* 12.2*  HGB 9.2* 9.3*  HCT 30.5* 30.4*  PLT 472* 494*   Recent Labs    02/01/18 0111  NA 136  K 4.7  CL 101  CO2 27  GLUCOSE 181*  BUN 21  CREATININE 0.89  CALCIUM 8.9       Ct Abdomen Pelvis W Contrast  Result Date: 01/03/2018 CLINICAL DATA:  Remote history of colon cancer (2003) now with worrisome left renal lesion. 17 pound weight loss. EXAM: CT ABDOMEN AND PELVIS WITH CONTRAST TECHNIQUE: Multidetector CT imaging of the abdomen and pelvis was performed using the standard protocol following bolus administration of intravenous contrast. CONTRAST:  60mL ISOVUE-300 IOPAMIDOL (ISOVUE-300) INJECTION 61% COMPARISON:  Chest CT-01/01/2018; CT abdomen pelvis-11/15/2012; 06/29/2011; 01/25/2011; PET-CT-02/22/2011 FINDINGS: Lower chest: Limited visualization of the lower thorax demonstrates minimal dependent subpleural ground-glass atelectasis. No discrete focal airspace opacities. No pleural effusion or pneumothorax. Borderline cardiomegaly. Coronary artery calcifications. No pericardial effusion. Hepatobiliary: Normal hepatic contour. Post cholecystectomy. Pneumobilia compatible with prior biliary sphincterotomy. No ascites. Pancreas: The pancreas appears atrophic. Spleen: Punctate (approximately 0.8 cm) hypoattenuating lesion with the dome of the spleen, too small to adequately characterize. Adrenals/Urinary Tract: There is an ill-defined heterogeneously enhancing and infiltrative appearing apparent mass involving the superior pole of the left kidney as was suggested on preceding chest CT measuring at least 5.7 x 5.8 x 5.2 cm (sagittal image 83, series 6; axial image 28, series 2). This ill-defined apparent infiltrative mass involves approximately 2/3 of the entirety of the left kidney. There is ill-defined slightly nodular.  Stranding about the left kidney. The left renal vein appears attenuated at the hilum though otherwise patent without definitive evidence of malignant thrombosis. Ill-defined stranding surrounds the left renal pelvis and superior most aspect of the left ureter. No definite evidence of left-sided urinary obstruction. No discrete right-sided renal lesion. Note is again made of a right-sided extrarenal pelvis. No right-sided urinary obstruction. Note is made of an approximately 2.2 x 1.6 cm left-sided adrenal gland nodule, new compared to the 10/2012 examination. Normal appearance of the right adrenal gland. Normal appearance of the urinary bladder, though note, evaluation degraded secondary streak artifact from the patient's right total hip prosthesis. Stomach/Bowel: Ingested enteric contrast extends to the level the rectum. Colonic diverticulosis without evidence of diverticulitis. Post right hemicolectomy without evidence of enteric obstruction. No pneumoperitoneum, pneumatosis or portal venous gas. Vascular/Lymphatic: Atherosclerotic plaque within a normal caliber abdominal aorta. The major branch vessels of the abdominal aorta appear patent on this non CTA examination. Ill-defined calcified mesenteric mass within the right mid hemiabdomen has increased in size compared to remote abdominal CT performed 01/2011, currently measuring approximately 4.5 x 3.8 x 1.7 cm (axial image 45, series 2; sagittal image 45, series 6), previously, 2.0 x 1.8 x 1.8 cm. No bulky retroperitoneal, mesenteric, pelvic or inguinal lymphadenopathy. Reproductive: Normal appearance of the pelvic organs. No  discrete adnexal lesion. No free fluid the pelvic cul-de-sac. Other: Regional soft tissues appear normal. Musculoskeletal: Post right total hip replacement. Post cement augmentation of the T11 vertebral body. Moderate severe multilevel lumbar spine DDD, worse at L5-S1 with disc space height loss, endplate irregularity and sclerosis.  IMPRESSION: Findings worrisome for intra-abdominal malignancy as follows: 1. Infiltrative at least 5.8 cm mass replacing the superior and mid aspects of the left kidney worrisome for malignancy though an atypical appearance for renal cell carcinoma (more infiltrative in appearance as opposed to discrete and solid). There is ill-defined stranding involving the renal pelvis superior aspect the left ureter without definitive evidence of left-sided urinary obstruction. The left renal vein is attenuated at the level of the left hilum without definitive malignant occlusion. 2. Interval increase in size of ill-defined calcified mesenteric mass within the right mid hemiabdomen now measuring approximately 4.5 cm in diameter, previously, 2.0 cm, when compared to the 01/2011 abdominal CT. Additionally, this calcification was noted to be hypermetabolic on remote PET-CT performed 02/2013. Given above, this mesenteric mass is worrisome for carcinoid tumor. 3. Approximately 2.2 cm left adrenal gland nodule, new compared to the 10/2012 abdominal CT and worrisome for metastatic disease. Given above, this patient would likely be best served with PET-CT imaging and/or contrast-enhanced abdominal MRI (to further characterize and evaluate the left renal lesion). 1. Sequela of prior right hemicolectomy without evidence of enteric obstruction. 2. Post cholecystectomy and biliary sphincterotomy with pneumobilia within the nondependent biliary tree. 3.  Aortic Atherosclerosis (ICD10-I70.0). Electronically Signed   By: Sandi Mariscal M.D.   On: 01/03/2018 20:20   Dg Esophagus  Result Date: 01/16/2018 CLINICAL DATA:  Dysphagia EXAM: ESOPHOGRAM/BARIUM SWALLOW TECHNIQUE: Single contrast examination was performed using  thin barium. FLUOROSCOPY TIME:  Fluoroscopy Time:  2 minutes 6 second Radiation Exposure Index (if provided by the fluoroscopic device): 93 mGy Number of Acquired Spot Images: 0 COMPARISON:  None. FINDINGS: Initially rapid  sequence spot films of the cervical esophagus were obtained in the lateral and frontal projections. There is no evidence of penetration or aspiration. The patient gives a history of the thyroid goiter, but no significant impingement upon the lower cervical esophagus is seen. There is mild narrowing of the lower cervical esophagus consistent with mild bleb. Images of esophageal peristalsis show moderate tertiary contractions in the mid and distal esophagus. No definite hiatal hernia could be detected. No gastroesophageal reflux is seen with the water siphon maneuver. The patient was given a barium pill at the end of the study. The pill did initially lodged within the vallecula, but after repeated swallowing, the pill did pass into the stomach without significant delay distally. IMPRESSION: 1. Mild to moderate tertiary contractions in the mid and distal esophagus. 2. No evidence of aspiration or penetration. 3. Barium pill lodges in the vallecula but does pass into the stomach after repeated swallows without delay. 4. No definite hiatal hernia or gastroesophageal reflux is seen. Electronically Signed   By: Ivar Drape M.D.   On: 01/16/2018 09:50   Nm Pet Image Initial (pi) Skull Base To Thigh  Result Date: 01/18/2018 CLINICAL DATA:  Initial treatment strategy for left kidney cancer. EXAM: NUCLEAR MEDICINE PET SKULL BASE TO THIGH TECHNIQUE: 6.6 mCi F-18 FDG was injected intravenously. Full-ring PET imaging was performed from the skull base to thigh after the radiotracer. CT data was obtained and used for attenuation correction and anatomic localization. Fasting blood glucose: 107 mg/dl COMPARISON:  CT abdomen/pelvis dated 01/03/2018. CT chest dated 01/01/2018.  FINDINGS: Mediastinal blood pool activity: SUV max 2.3 NECK: No hypermetabolic cervical lymphadenopathy. Incidental CT findings: None CHEST: No hypermetabolic thoracic lymphadenopathy. Mild branching nodularity in the lateral right upper lobe (series 8/image  26), max SUV 1.1, likely infectious/inflammatory. Mild subpleural reticulation in the posterior right lower lobe (series 8/image 42), with associated mild hypermetabolism, max SUV 2.1. This appearance is not considered suspicious for metastasis. Incidental CT findings: Mild ectasia the ascending thoracic aorta, measuring 3.3 cm. Atherosclerotic calcifications of the aortic arch. Three vessel coronary atherosclerosis. ABDOMEN/PELVIS: Infiltrating left upper pole renal mass, measuring 5.4 x 6.1 cm, max SUV 10.7. 1.9 cm left adrenal metastasis, max SUV 7.5. Suspected 15 mm subcapsular lesion at the junction of segment 4A and segment 8 (series 4/image 94), max SUV 4.1. This was not evident on recent enhanced CT abdomen/pelvis. No abnormal hypermetabolism in the spleen, pancreas, or right adrenal gland. No hypermetabolic abdominopelvic lymphadenopathy. Incidental CT findings: Atherosclerotic calcifications the abdominal aorta and branch vessels. Status post cholecystectomy. Residual contrast with streak artifact involving distal colon. Associated colonic diverticulosis, without evidence of diverticulitis. SKELETON: No focal hypermetabolic activity to suggest skeletal metastasis. Incidental CT findings: Degenerative changes of the visualized thoracolumbar spine. Right hip arthroplasty, without evidence of complication. IMPRESSION: 6.1 cm infiltrating left upper pole renal mass, corresponding to the patient's known left renal neoplasm. Associated 1.9 cm left adrenal metastasis. Suspected new 15 mm subcapsular hepatic lesion at the junction of segment 4A and 8, worrisome for liver metastasis, although not evident on recent CT abdomen/pelvis. No findings suspicious for metastatic disease in the chest. Electronically Signed   By: Julian Hy M.D.   On: 01/18/2018 15:40     Assessment and Plan:    85 year old woman with the following issues:  1.  Renal mass detected in December 2019.  She developed chest pain  and abdominal discomfort and found to have 6.1 cm left upper pole renal mass with possible left adrenal metastasis.  She does have a questionable 50 mm nodule in the liver although it was not detected on CT scan.  The natural course of this disease as well as differential diagnosis was reviewed today with the patient.  I feel that she has a primary kidney neoplasm and better treated with primary surgical therapy.  Despite the potential adrenal involvement and questionable liver lesion, I favor proceeding with primary surgical therapy.  I do not feel that systemic therapy will offer much benefit on the long-term and would be only deferred if surgery cannot be completed.  Systemic therapy in this particular setting will be hinging on obtaining tissue biopsy and will be in the form of immunotherapy or oral targeted therapy.  Therapy would be palliative at best.  She has appointment scheduled with Dr. Alyson Ingles today to discuss nephrectomy. I agree with proceeding with that upfront.  2.  Epistaxis: Coagulation parameters appear normal with normal PT and INR as well as platelet count.  Her PTT is slightly elevated.  I do not think this is related to her renal neoplasm.  Evidence to suggest DIC.  I recommended continue to boost her nutritional intake to combat any vitamin K deficiency.  3.  Follow-up: We will be in the next 4 to 6 weeks to follow her progress post operatively and determine next course of action.  60  minutes was spent with the patient face-to-face today.  More than 50% of time was dedicated to reviewing imaging studies, differential diagnosis and answering question regarding future plan of care.  Thank you for the referral.  A copy of this consult has been forwarded to the requesting physician.

## 2018-02-01 NOTE — Discharge Instructions (Addendum)
Unfortunately you will need to have the balloon in your nose for about 4 to 5 days.  Please call Dr. Benjamine Mola, ears nose and throat specialist to have him recheck you and remove the balloon.  Return to the emergency department if you get severe facial pain, fever or you start having heavy bleeding again.  Take the pain medication as needed, take the antibiotic because the balloon is blocking the drainage of your sinuses you could develop a sinus infection.  You do have a mild anemia now but not to the point that you need a transfusion.

## 2018-02-01 NOTE — Telephone Encounter (Signed)
Printed calendar and avs. °

## 2018-02-01 NOTE — ED Provider Notes (Signed)
[facility] EMERGENCY DEPARTMENT Provider Note   CSN: [id number] Arrival date & time: 01/31/18  2350  Time seen 1:30 AM   History   Chief Complaint Chief Complaint  Patient presents with  . Epistaxis   Level 5 caveat for urgent need for intervention  HPI [name] is a 85 y.o. female.  HPI patient reports shortly before calling EMS she had acute onset of left epistaxis.  She states she is never had this before.  She states the only blood thinner she is on is a baby aspirin a day.  She was recently diagnosed with a renal tumor and has her first appointment with [name], oncologist in the morning.  PCP [name], MD   Past Medical History:  Diagnosis Date  . Abnormal uterine bleeding (AUB)    on cont HRT  . Anemia    hx of in 2003   . Arthritis   . Blood transfusion without reported diagnosis   . Calcification of aorta (HCC) 11/16/2012  . Choledocholithiasis with obstruction 11/16/2012  . Colon cancer [facility])    s/p resecton of colon in 2003 , hx of chemo  . Colon cancer ([place])   . Compression fracture jan 2012   due to fall  . Depression   . DJD (degenerative joint disease)   . DM II (diabetes mellitus, type II), controlled 11/16/2012  . Fibromyalgia   . GERD (gastroesophageal reflux disease)   . Goiter   . Headache(784.0)    tension headache daily per pt   . Headaches, cluster   . Heart murmur   . Hiatal hernia   . Hypertension   . Hypothyroid   . Kidney cancer, primary, with metastasis from kidney to other site [facility])   . Macular degeneration   . Osteoarthritis   . Pancreatitis, acute 11/15/2012  . Peripheral vascular disease ([place])    varicose veins in left arm   . PONV (postoperative nausea and vomiting)   . Shortness of breath    with exertion   . TMJ (dislocation of temporomandibular joint)     Patient Active Problem List   Diagnosis Date Noted  . Suprapubic discomfort 07/06/2017  . Vitamin D deficiency 08/19/2015  . Closed right  hip fracture ([place]) 05/30/2015  . Chest pain syndrome 05/30/2015  . Fall at home 05/30/2015  . Closed right femoral fracture ([place]) 05/30/2015  . Displaced fracture of right femoral neck ([place]) 05/30/2015  . Fall   . Cough 07/02/2013  . Other dyspnea and respiratory abnormality 07/02/2013  . History of colon cancer, stage III 07/02/2013  . Chronic cholecystitis with calculus s/p lap chole 11/17/2012 11/19/2012  . GERD (gastroesophageal reflux disease) 11/16/2012  . Choledocholithiasis with obstruction 11/16/2012  . Calcification of aorta (HCC) 11/16/2012  . Atherosclerosis 11/16/2012  . Hypothyroidism 11/15/2012  . Nonspecific (abnormal) findings on radiological and other examination of gastrointestinal tract 11/08/2012  . S/P right knee replacement 12/22/2010  . Preop cardiovascular exam 12/08/2010  . Essential hypertension 12/08/2010  . Hyperlipidemia 12/08/2010    Past Surgical History:  Procedure Laterality Date  . APPENDECTOMY    . BACK SURGERY  2000   lower  . CHOLECYSTECTOMY N/A 11/17/2012   Procedure: LAPAROSCOPIC CHOLECYSTECTOMY;  Surgeon: Leighton Ruff, MD;  Location: [facility];  Service: General;  Laterality: N/A;  . COLON RESECTION  2003  . COLONOSCOPY W/ BIOPSIES  09/25/2002, 09/18/2001  . DILATION AND CURETTAGE OF UTERUS    . ERCP N/A 11/14/2012   Procedure: ENDOSCOPIC RETROGRADE  CHOLANGIOPANCREATOGRAPHY (ERCP);  Surgeon: Jeryl Columbia, MD;  Location: [facility];  Service: Endoscopy;  Laterality: N/A;  . ESOPHAGOGASTRODUODENOSCOPY ENDOSCOPY    . EYE SURGERY     bilateral cataract surgery   . HYSTEROSCOPY    . JOINT REPLACEMENT Right dec 2012   knee  . porta catheter placement  10/30/2001  . porta catheter removal  06/27/2003  . TMJ ARTHROPLASTY  1990's  . TMJ ARTHROPLASTY    . TONSILLECTOMY  age 57 or 82  . TOTAL HIP ARTHROPLASTY Right 05/30/2015   Procedure: TOTAL HIP ARTHROPLASTY ANTERIOR APPROACH;  Surgeon: Rod Can, MD;  Location: [facility];  Service: Orthopedics;   Laterality: Right;  . TOTAL KNEE ARTHROPLASTY  12/20/2010   Procedure: TOTAL KNEE ARTHROPLASTY;  Surgeon: Mauri Pole;  Location: [facility];  Service: Orthopedics;  Laterality: Right;  . UPPER GASTROINTESTINAL ENDOSCOPY  09/18/2001  . VERTEBROPLASTY  2012     OB History    Gravida  1   Para  1   Term  1   Preterm  0   AB  0   Living  1     SAB  0   TAB  0   Ectopic  0   Multiple  0   Live Births  1            Home Medications    Prior to Admission medications   Medication Sig Start Date End Date Taking? Authorizing Provider  acetaminophen (TYLENOL 8 HOUR ARTHRITIS PAIN) 650 MG CR tablet Take 650 mg by mouth every 8 (eight) hours as needed for pain. Take 2 every 8 hours as needed    [provider]  amoxicillin (AMOXIL) 500 MG capsule Take 1 capsule (500 mg total) by mouth 3 (three) times daily. 02/01/18   Rolland Porter, MD  aspirin EC 81 MG EC tablet Take 1 tablet (81 mg total) by mouth 2 (two) times daily after a meal. 06/01/15   Swinteck, Aaron Edelman, MD  Calcium-Vitamin D (CALTRATE 600 PLUS-VIT D PO) Take 1 tablet by mouth 2 (two) times daily.     [provider]  docusate sodium (COLACE) 100 MG capsule Take 100 mg by mouth 3 (three) times daily as needed for constipation.    [provider]  estradiol (ESTRACE) 0.1 MG/GM vaginal cream Place 0.1 g vaginally 2 (two) times a week. 06/27/17   [provider]  estradiol (ESTRACE) 0.5 MG tablet Take 0.5 mg by mouth daily. 05/16/16   [provider]  lactobacillus acidophilus (BACID) TABS tablet Take 1 tablet by mouth 2 (two) times daily.    [provider]  levothyroxine (SYNTHROID, LEVOTHROID) 200 MCG tablet Take 200 mcg by mouth daily before breakfast.    [provider]  medroxyPROGESTERone (PROVERA) 5 MG tablet Take 5 mg by mouth daily. 05/16/16   [provider]  Multiple Vitamins-Minerals (CENTRUM SILVER ULTRA WOMENS PO) Take by mouth.    [provider]  Multiple Vitamins-Minerals (PRESERVISION AREDS PO) Take 2 capsules by mouth daily.    [provider]  nebivolol (BYSTOLIC) 10 MG tablet Take 10 mg by mouth daily.    [provider]  nortriptyline (PAMELOR) 25 MG capsule Take 75 mg by mouth every morning.     [provider]  omeprazole (PRILOSEC) 20 MG capsule Take 20 mg by mouth daily.  07/06/14   [provider]  simvastatin (ZOCOR) 20 MG tablet Take 20 mg by mouth at bedtime.  [provider]  traMADol (ULTRAM) 50 MG tablet Take 1 tablet (50 mg total) by mouth every 6 (six) hours as needed. 02/01/18   Rolland Porter, MD  vitamin B-12 (CYANOCOBALAMIN) 1000 MCG tablet Take 1,000 mcg by mouth daily.    [provider]  vitamin C (ASCORBIC ACID) 500 MG tablet Take 500 mg by mouth daily.     [provider]  Vitamin D, Ergocalciferol, (DRISDOL) 50000 units CAPS capsule take ONE CAPSULE BY MOUTH every 7 DAYS Patient taking differently: take ONE CAPSULE BY MOUTH every 14 days 08/19/15   Kem Boroughs, FNP  vitamin E 400 UNIT capsule Take 400 Units by mouth 2 (two) times daily.     [provider]  zolpidem (AMBIEN) 10 MG tablet Take 1 tablet (10 mg total) by mouth at bedtime. 06/01/15   Theodis Blaze, MD    Family History Family History  Problem Relation Age of Onset  . Heart disease Mother        Enlarged heart  . Arrhythmia Mother   . Diabetes Mother   . Heart disease Brother        MI at unknown age  . Anesthesia problems Brother   . Heart disease Father   . Cancer Son        non-hodgkins lymphoma  . Stroke Sister   . Heart attack Other        MI at age 80  . Heart disease Brother        CABG    Social History Social History   Tobacco Use  . Smoking status: Never Smoker  . Smokeless tobacco: Never Used  Substance Use Topics  . Alcohol use: No  . Drug use: No  lives at home   Allergies   Tape; Latex; Lidocaine; and Sulfur   Review  of Systems Review of Systems  All other systems reviewed and are negative.    Physical Exam Updated Vital Signs BP (!) 148/86   Pulse 100   LMP 01/17/1994   SpO2 97%   Physical Exam Vitals signs and nursing note reviewed.  Constitutional:      Comments: Frail elderly female  HENT:     Head: Normocephalic and atraumatic.     Right Ear: External ear normal.     Left Ear: External ear normal.     Nose:     Comments: Nurses at bedside holding pressure the patient's nose however the washcloth she is using is getting blood soaked and she is spitting up large amount of blood. Eyes:     Extraocular Movements: Extraocular movements intact.     Conjunctiva/sclera: Conjunctivae normal.  Cardiovascular:     Rate and Rhythm: Normal rate.  Pulmonary:     Effort: Pulmonary effort is normal. No respiratory distress.  Musculoskeletal: Normal range of motion.  Skin:    General: Skin is warm and dry.     Capillary Refill: Capillary refill takes less than 2 seconds.     Coloration: Skin is pale.  Neurological:     General: No focal deficit present.     Mental Status: She is oriented to person, place, and time.     Cranial Nerves: No cranial nerve deficit.  Psychiatric:        Mood and Affect: Mood normal.        Behavior: Behavior normal.        Thought Content: Thought content normal.      ED Treatments / Results  Labs (all  labs ordered are listed, but only abnormal results are displayed) Results for orders placed or performed during the hospital encounter of 03/50/09  Basic metabolic panel  Result Value Ref Range   Sodium 136 135 - 145 mmol/L   Potassium 4.7 3.5 - 5.1 mmol/L   Chloride 101 98 - 111 mmol/L   CO2 27 22 - 32 mmol/L   Glucose, Bld 181 (H) 70 - 99 mg/dL   BUN 21 8 - 23 mg/dL   Creatinine, Ser 0.89 0.44 - 1.00 mg/dL   Calcium 8.9 8.9 - 10.3 mg/dL   GFR calc non Af Amer 60 (L) >60 mL/min   GFR calc Af Amer >60 >60 mL/min   Anion gap 8 5 - 15  CBC with  Differential  Result Value Ref Range   WBC 11.0 (H) 4.0 - 10.5 K/uL   RBC 3.04 (L) 3.87 - 5.11 MIL/uL   Hemoglobin 9.2 (L) 12.0 - 15.0 g/dL   HCT 30.5 (L) 36.0 - 46.0 %   MCV 100.3 (H) 80.0 - 100.0 fL   MCH 30.3 26.0 - 34.0 pg   MCHC 30.2 30.0 - 36.0 g/dL   RDW 13.1 11.5 - 15.5 %   Platelets 472 (H) 150 - 400 K/uL   nRBC 0.0 0.0 - 0.2 %   Neutrophils Relative % 77 %   Neutro Abs 8.5 (H) 1.7 - 7.7 K/uL   Lymphocytes Relative 10 %   Lymphs Abs 1.1 0.7 - 4.0 K/uL   Monocytes Relative 11 %   Monocytes Absolute 1.2 (H) 0.1 - 1.0 K/uL   Eosinophils Relative 1 %   Eosinophils Absolute 0.1 0.0 - 0.5 K/uL   Basophils Relative 0 %   Basophils Absolute 0.0 0.0 - 0.1 K/uL   Immature Granulocytes 1 %   Abs Immature Granulocytes 0.07 0.00 - 0.07 K/uL  Protime-INR  Result Value Ref Range   Prothrombin Time 13.9 11.4 - 15.2 seconds   INR 1.08   APTT  Result Value Ref Range   aPTT 39 (H) 24 - 36 seconds  CBC with Differential  Result Value Ref Range   WBC 12.2 (H) 4.0 - 10.5 K/uL   RBC 3.03 (L) 3.87 - 5.11 MIL/uL   Hemoglobin 9.3 (L) 12.0 - 15.0 g/dL   HCT 30.4 (L) 36.0 - 46.0 %   MCV 100.3 (H) 80.0 - 100.0 fL   MCH 30.7 26.0 - 34.0 pg   MCHC 30.6 30.0 - 36.0 g/dL   RDW 13.1 11.5 - 15.5 %   Platelets 494 (H) 150 - 400 K/uL   nRBC 0.0 0.0 - 0.2 %   Neutrophils Relative % 78 %   Neutro Abs 9.5 (H) 1.7 - 7.7 K/uL   Lymphocytes Relative 9 %   Lymphs Abs 1.1 0.7 - 4.0 K/uL   Monocytes Relative 11 %   Monocytes Absolute 1.4 (H) 0.1 - 1.0 K/uL   Eosinophils Relative 1 %   Eosinophils Absolute 0.1 0.0 - 0.5 K/uL   Basophils Relative 0 %   Basophils Absolute 0.0 0.0 - 0.1 K/uL   Immature Granulocytes 1 %   Abs Immature Granulocytes 0.08 (H) 0.00 - 0.07 K/uL       EKG None  Radiology No results found.    Ct Abdomen Pelvis W Contrast  Result Date: 01/03/2018 CLINICAL DATA:  Remote history of colon cancer (2003) now with worrisome left renal lesion. 17 pound weight loss..  IMPRESSION: Findings worrisome for intra-abdominal malignancy as follows: 1. Infiltrative at least  5.8 cm mass replacing the superior and mid aspects of the left kidney worrisome for malignancy though an atypical appearance for renal cell carcinoma (more infiltrative in appearance as opposed to discrete and solid). There is ill-defined stranding involving the renal pelvis superior aspect the left ureter without definitive evidence of left-sided urinary obstruction. The left renal vein is attenuated at the level of the left hilum without definitive malignant occlusion. 2. Interval increase in size of ill-defined calcified mesenteric mass within the right mid hemiabdomen now measuring approximately 4.5 cm in diameter, previously, 2.0 cm, when compared to the 01/2011 abdominal CT. Additionally, this calcification was noted to be hypermetabolic on remote PET-CT performed 02/2013. Given above, this mesenteric mass is worrisome for carcinoid tumor. 3. Approximately 2.2 cm left adrenal gland nodule, new compared to the 10/2012 abdominal CT and worrisome for metastatic disease. Given above, this patient would likely be best served with PET-CT imaging and/or contrast-enhanced abdominal MRI (to further characterize and evaluate the left renal lesion). 1. Sequela of prior right hemicolectomy without evidence of enteric obstruction. 2. Post cholecystectomy and biliary sphincterotomy with pneumobilia within the nondependent biliary tree. 3.  Aortic Atherosclerosis (ICD10-I70.0). Electronically Signed   By: Sandi Mariscal M.D.   On: 01/03/2018 20:20   Dg Esophagus  Result Date: 01/16/2018 CLINICAL DATA:  Dysphagia. IMPRESSION: 1. Mild to moderate tertiary contractions in the mid and distal esophagus. 2. No evidence of aspiration or penetration. 3. Barium pill lodges in the vallecula but does pass into the stomach after repeated swallows without delay. 4. No definite hiatal hernia or gastroesophageal reflux is seen.  Electronically Signed   By: Ivar Drape M.D.   On: 01/16/2018 09:50   Nm Pet Image Initial (pi) Skull Base To Thigh  Result Date: 01/18/2018 CLINICAL DATA:  Initial treatment strategy for left kidney cancer.  IMPRESSION: 6.1 cm infiltrating left upper pole renal mass, corresponding to the patient's known left renal neoplasm. Associated 1.9 cm left adrenal metastasis. Suspected new 15 mm subcapsular hepatic lesion at the junction of segment 4A and 8, worrisome for liver metastasis, although not evident on recent CT abdomen/pelvis. No findings suspicious for metastatic disease in the chest. Electronically Signed   By: Julian Hy M.D.   On: 01/18/2018 15:40   Procedures .Epistaxis Management Date/Time: 02/01/2018 4:10 AM Performed by: Rolland Porter, MD Authorized by: Rolland Porter, MD   Consent:    Consent obtained:  Verbal   Consent given by:  Patient Anesthesia (see MAR for exact dosages):    Anesthesia method:  None Procedure details:    Treatment site:  Unable to specify (left sided)   Treatment method:  Nasal tampon   Treatment complexity:  Limited Post-procedure details:    Assessment:  Bleeding stopped   Patient tolerance of procedure:  Tolerated well, no immediate complications .Critical Care Performed by: Rolland Porter, MD Authorized by: Rolland Porter, MD     (including critical care time)  Medications Ordered in ED Medications  oxymetazoline (AFRIN) 0.05 % nasal spray 1 spray (1 spray Each Nare Given 02/01/18 0008)     Initial Impression / Assessment and Plan / ED Course  I have reviewed the triage vital signs and the nursing notes.  Pertinent labs & imaging results that were available during my care of the patient were reviewed by me and considered in my medical decision making (see chart for details).     Nurse had sprayed Afrin in her nose and was trying to hold pressure without controlling the  bleeding.  A rapid Rhino posterior catheter was placed and the balloons were  filled with sterile saline.  Afterwards the patient stopped having blood draining down the back of her throat.  Her blood pressure had been very elevated with EMS at 203/105.  Her blood pressure here after the rapid Rhino was placed was 167/91.  Laboratory testing was done.  Patient's repeat CBG shows  her hemoglobin has been stable in the ED tonight.  Her last hemoglobin I had to compare to was in June when it was 11.8.  May 2017 she was down to 8.8.  Patient was discharged home with the rapid Rhino, she was placed on antibiotics and given tramadol for pain, explained to her that the balloon in her nose does increase her risk of developing a sinusitis.  She should be rechecked if she gets facial pain, heavy bleeding or feels worse.  She should follow-up with Dr. Benjamine Mola, ENT, they usually like to leave the rapid Rhino in for around 5 days.  Her blood pressure at time of discharge was 148/86.  Final Clinical Impressions(s) / ED Diagnoses   Final diagnoses:  Left-sided epistaxis    ED Discharge Orders         Ordered    amoxicillin (AMOXIL) 500 MG capsule  3 times daily     02/01/18 0404    traMADol (ULTRAM) 50 MG tablet  Every 6 hours PRN     02/01/18 0404         Plan discharge  Rolland Porter, MD, Barbette Or, MD 02/01/18 [id number]

## 2018-02-02 ENCOUNTER — Other Ambulatory Visit: Payer: Self-pay

## 2018-02-02 ENCOUNTER — Encounter (HOSPITAL_COMMUNITY): Payer: Self-pay | Admitting: Emergency Medicine

## 2018-02-02 ENCOUNTER — Observation Stay (HOSPITAL_COMMUNITY)
Admission: EM | Admit: 2018-02-02 | Discharge: 2018-02-03 | Disposition: A | Discharge status: Home / Self Care | Payer: Medicare Other | Attending: Family Medicine | Admitting: Family Medicine

## 2018-02-02 DIAGNOSIS — Z85528 Personal history of other malignant neoplasm of kidney: Secondary | ICD-10-CM | POA: Diagnosis not present

## 2018-02-02 DIAGNOSIS — R531 Weakness: Secondary | ICD-10-CM | POA: Diagnosis present

## 2018-02-02 DIAGNOSIS — Z7982 Long term (current) use of aspirin: Secondary | ICD-10-CM | POA: Diagnosis not present

## 2018-02-02 DIAGNOSIS — Z793 Long term (current) use of hormonal contraceptives: Secondary | ICD-10-CM | POA: Insufficient documentation

## 2018-02-02 DIAGNOSIS — D72829 Elevated white blood cell count, unspecified: Secondary | ICD-10-CM | POA: Insufficient documentation

## 2018-02-02 DIAGNOSIS — R55 Syncope and collapse: Secondary | ICD-10-CM | POA: Diagnosis present

## 2018-02-02 DIAGNOSIS — E038 Other specified hypothyroidism: Secondary | ICD-10-CM

## 2018-02-02 DIAGNOSIS — Z79899 Other long term (current) drug therapy: Secondary | ICD-10-CM | POA: Diagnosis not present

## 2018-02-02 DIAGNOSIS — E785 Hyperlipidemia, unspecified: Secondary | ICD-10-CM | POA: Diagnosis present

## 2018-02-02 DIAGNOSIS — D649 Anemia, unspecified: Secondary | ICD-10-CM | POA: Diagnosis present

## 2018-02-02 DIAGNOSIS — I951 Orthostatic hypotension: Secondary | ICD-10-CM | POA: Diagnosis not present

## 2018-02-02 DIAGNOSIS — F329 Major depressive disorder, single episode, unspecified: Secondary | ICD-10-CM | POA: Insufficient documentation

## 2018-02-02 DIAGNOSIS — Z85038 Personal history of other malignant neoplasm of large intestine: Secondary | ICD-10-CM | POA: Insufficient documentation

## 2018-02-02 DIAGNOSIS — E1151 Type 2 diabetes mellitus with diabetic peripheral angiopathy without gangrene: Secondary | ICD-10-CM | POA: Diagnosis not present

## 2018-02-02 DIAGNOSIS — E039 Hypothyroidism, unspecified: Secondary | ICD-10-CM | POA: Diagnosis present

## 2018-02-02 DIAGNOSIS — R42 Dizziness and giddiness: Secondary | ICD-10-CM | POA: Diagnosis present

## 2018-02-02 DIAGNOSIS — R011 Cardiac murmur, unspecified: Secondary | ICD-10-CM | POA: Diagnosis not present

## 2018-02-02 DIAGNOSIS — I10 Essential (primary) hypertension: Secondary | ICD-10-CM | POA: Diagnosis not present

## 2018-02-02 DIAGNOSIS — K219 Gastro-esophageal reflux disease without esophagitis: Secondary | ICD-10-CM | POA: Diagnosis present

## 2018-02-02 DIAGNOSIS — Z79890 Hormone replacement therapy (postmenopausal): Secondary | ICD-10-CM | POA: Insufficient documentation

## 2018-02-02 DIAGNOSIS — M797 Fibromyalgia: Secondary | ICD-10-CM | POA: Diagnosis not present

## 2018-02-02 LAB — URINALYSIS, ROUTINE W REFLEX MICROSCOPIC
Bilirubin Urine: NEGATIVE
GLUCOSE, UA: NEGATIVE mg/dL
Hgb urine dipstick: NEGATIVE
Ketones, ur: NEGATIVE mg/dL
Nitrite: NEGATIVE
PROTEIN: NEGATIVE mg/dL
Specific Gravity, Urine: 1.005 (ref 1.005–1.030)
pH: 7 (ref 5.0–8.0)

## 2018-02-02 LAB — BASIC METABOLIC PANEL
ANION GAP: 11 (ref 5–15)
BUN: 18 mg/dL (ref 8–23)
CO2: 26 mmol/L (ref 22–32)
Calcium: 8.9 mg/dL (ref 8.9–10.3)
Chloride: 98 mmol/L (ref 98–111)
Creatinine, Ser: 0.89 mg/dL (ref 0.44–1.00)
GFR calc Af Amer: >60 mL/min (ref >60)
GFR calc non Af Amer: 60 mL/min — ABNORMAL LOW (ref >60)
Glucose, Bld: 174 mg/dL — ABNORMAL HIGH (ref 70–99)
Potassium: 4.2 mmol/L (ref 3.5–5.1)
Sodium: 135 mmol/L (ref 135–145)

## 2018-02-02 LAB — CBC
HCT: 24.7 % — ABNORMAL LOW (ref 36.0–46.0)
HEMOGLOBIN: 7.6 g/dL — AB (ref 12.0–15.0)
MCH: 31.8 pg (ref 26.0–34.0)
MCHC: 30.8 g/dL (ref 30.0–36.0)
MCV: 103.3 fL — ABNORMAL HIGH (ref 80.0–100.0)
Platelets: 517 10*3/uL — ABNORMAL HIGH (ref 150–400)
RBC: 2.39 MIL/uL — AB (ref 3.87–5.11)
RDW: 12.9 % (ref 11.5–15.5)
WBC: 15.4 10*3/uL — ABNORMAL HIGH (ref 4.0–10.5)
nRBC: 0 % (ref 0.0–0.2)

## 2018-02-02 LAB — HEMOGLOBIN AND HEMATOCRIT, BLOOD
HCT: 30.5 % — ABNORMAL LOW (ref 36.0–46.0)
Hemoglobin: 9.5 g/dL — ABNORMAL LOW (ref 12.0–15.0)

## 2018-02-02 LAB — CBG MONITORING, ED: Glucose-Capillary: 159 mg/dL — ABNORMAL HIGH (ref 70–99)

## 2018-02-02 MED ORDER — SIMVASTATIN 10 MG PO TABS
20.0000 mg | ORAL_TABLET | Freq: Every day | ORAL | Status: DC
  Administered 2018-02-02 – 2018-02-03 (×2): 20 mg via ORAL
  Filled 2018-02-02 (×2): qty 2

## 2018-02-02 MED ORDER — VITAMIN C 500 MG PO TABS
500.0000 mg | ORAL_TABLET | Freq: Every day | ORAL | Status: DC
  Administered 2018-02-03: 500 mg via ORAL
  Filled 2018-02-02: qty 1

## 2018-02-02 MED ORDER — SODIUM CHLORIDE 0.9 % IV SOLN
INTRAVENOUS | Status: DC
  Administered 2018-02-02 – 2018-02-03 (×2): via INTRAVENOUS

## 2018-02-02 MED ORDER — POLYETHYLENE GLYCOL 3350 17 G PO PACK: 17.0000 g | PACK | Freq: Every day | ORAL | Status: DC | PRN

## 2018-02-02 MED ORDER — MEDROXYPROGESTERONE ACETATE 2.5 MG PO TABS
2.5000 mg | ORAL_TABLET | Freq: Every day | ORAL | Status: DC
  Administered 2018-02-02 – 2018-02-03 (×2): 2.5 mg via ORAL
  Filled 2018-02-02 (×2): qty 1

## 2018-02-02 MED ORDER — SACCHAROMYCES BOULARDII 250 MG PO CAPS
250.0000 mg | ORAL_CAPSULE | Freq: Every day | ORAL | Status: DC
  Administered 2018-02-02: 250 mg via ORAL
  Filled 2018-02-02 (×2): qty 1

## 2018-02-02 MED ORDER — AMOXICILLIN 500 MG PO CAPS
500.0000 mg | ORAL_CAPSULE | Freq: Three times a day (TID) | ORAL | Status: DC
  Administered 2018-02-02 – 2018-02-03 (×3): 500 mg via ORAL
  Filled 2018-02-02 (×4): qty 1

## 2018-02-02 MED ORDER — POLYVINYL ALCOHOL 1.4 % OP SOLN
1.0000 [drp] | OPHTHALMIC | Status: DC | PRN
  Filled 2018-02-02: qty 15

## 2018-02-02 MED ORDER — TRAMADOL HCL 50 MG PO TABS: 50.0000 mg | ORAL_TABLET | Freq: Four times a day (QID) | ORAL | Status: DC | PRN

## 2018-02-02 MED ORDER — ACETAMINOPHEN 325 MG PO TABS
650.0000 mg | ORAL_TABLET | Freq: Four times a day (QID) | ORAL | Status: DC | PRN
  Administered 2018-02-02: 650 mg via ORAL
  Filled 2018-02-02: qty 2

## 2018-02-02 MED ORDER — ESTRADIOL 0.5 MG PO TABS: 0.2500 mg | ORAL_TABLET | Freq: Every day | ORAL | Status: DC

## 2018-02-02 MED ORDER — BACID PO TABS
1.0000 | ORAL_TABLET | Freq: Two times a day (BID) | ORAL | Status: DC
  Administered 2018-02-02: 1 via ORAL
  Filled 2018-02-02 (×2): qty 1

## 2018-02-02 MED ORDER — ZOLPIDEM TARTRATE 5 MG PO TABS
5.0000 mg | ORAL_TABLET | Freq: Every day | ORAL | Status: DC
  Administered 2018-02-02: 5 mg via ORAL
  Filled 2018-02-02: qty 1

## 2018-02-02 MED ORDER — NORTRIPTYLINE HCL 25 MG PO CAPS
75.0000 mg | ORAL_CAPSULE | Freq: Every morning | ORAL | Status: DC
  Administered 2018-02-03: 75 mg via ORAL
  Filled 2018-02-02: qty 3

## 2018-02-02 MED ORDER — SODIUM CHLORIDE 0.9% FLUSH: 3.0000 mL | Freq: Once | INTRAVENOUS | Status: DC

## 2018-02-02 MED ORDER — SODIUM CHLORIDE 0.9 % IV BOLUS
500.0000 mL | Freq: Once | INTRAVENOUS | Status: AC
  Administered 2018-02-02: 500 mL via INTRAVENOUS

## 2018-02-02 MED ORDER — METOPROLOL SUCCINATE ER 50 MG PO TB24
50.0000 mg | ORAL_TABLET | Freq: Every day | ORAL | Status: DC
  Administered 2018-02-02 – 2018-02-03 (×2): 50 mg via ORAL
  Filled 2018-02-02 (×2): qty 1

## 2018-02-02 MED ORDER — VITAMIN B-12 1000 MCG PO TABS
1000.0000 ug | ORAL_TABLET | Freq: Every day | ORAL | Status: DC
  Administered 2018-02-02 – 2018-02-03 (×2): 1000 ug via ORAL
  Filled 2018-02-02 (×2): qty 1

## 2018-02-02 MED ORDER — ONDANSETRON HCL 4 MG/2ML IJ SOLN: 4.0000 mg | Freq: Four times a day (QID) | INTRAMUSCULAR | Status: DC | PRN

## 2018-02-02 MED ORDER — LEVOTHYROXINE SODIUM 100 MCG PO TABS
200.0000 ug | ORAL_TABLET | Freq: Every day | ORAL | Status: DC
  Administered 2018-02-03: 200 ug via ORAL
  Filled 2018-02-02: qty 2

## 2018-02-02 MED ORDER — DOCUSATE SODIUM 100 MG PO CAPS
200.0000 mg | ORAL_CAPSULE | Freq: Every day | ORAL | Status: DC
  Administered 2018-02-02 – 2018-02-03 (×2): 200 mg via ORAL
  Filled 2018-02-02 (×2): qty 2

## 2018-02-02 MED ORDER — ONDANSETRON HCL 4 MG PO TABS: 4.0000 mg | ORAL_TABLET | Freq: Four times a day (QID) | ORAL | Status: DC | PRN

## 2018-02-02 MED ORDER — VITAMIN E 180 MG (400 UNIT) PO CAPS
400.0000 [IU] | ORAL_CAPSULE | Freq: Two times a day (BID) | ORAL | Status: DC
  Administered 2018-02-02 – 2018-02-03 (×2): 400 [IU] via ORAL
  Filled 2018-02-02 (×2): qty 1

## 2018-02-02 MED ORDER — ACETAMINOPHEN 650 MG RE SUPP: 650.0000 mg | Freq: Four times a day (QID) | RECTAL | Status: DC | PRN

## 2018-02-02 NOTE — Progress Notes (Signed)
Report given to receiving RN.

## 2018-02-02 NOTE — ED Triage Notes (Addendum)
Patient reports acute onset of weakness with a sensation in the back of her head which radiated down to her legs.  States this felt like a "sinking feeling".  Reports this was sometime between 4am and 6am.  Ambulatory to bathroom upon arrival x 1 assist without difficulty.  Patient with no deficits at present.  States she was unable to take her BP medication this morning.

## 2018-02-02 NOTE — Progress Notes (Signed)
ED TO INPATIENT HANDOFF REPORT  Name/Age/Gender [name] 85 y.o. female  Code Status Code Status History    Date Active Date Inactive Code Status Order ID Comments User Context   05/30/2015 1541 06/01/2015 1859 Full Code [id number]  Samella Parr, NP Inpatient   11/15/2012 1640 11/19/2012 1936 Full Code [id number]  Kinnie Feil, MD Inpatient      Home/SNF/Other Home  Chief Complaint weakness  Level of Care/Admitting Diagnosis ED Disposition    ED Disposition Condition [place]: [facility] [[id number]]  Level of Care: Telemetry [5]  Admit to tele based on following criteria: Eval of Syncope  Diagnosis: Near syncope [phone]  Admitting Physician: Flora Lipps [[id number]]  Attending Physician: Flora Lipps [[id number]]  PT Class (Do Not Modify): Observation [104]  PT Acc Code (Do Not Modify): Observation [10022]       Medical History Past Medical History:  Diagnosis Date  . Abnormal uterine bleeding (AUB)    on cont HRT  . Anemia    hx of in 2003   . Arthritis   . Blood transfusion without reported diagnosis   . Calcification of aorta (HCC) 11/16/2012  . Choledocholithiasis with obstruction 11/16/2012  . Colon cancer [facility])    s/p resecton of colon in 2003 , hx of chemo  . Colon cancer ([place])   . Compression fracture jan 2012   due to fall  . Depression   . DJD (degenerative joint disease)   . DM II (diabetes mellitus, type II), controlled 11/16/2012  . Fibromyalgia   . GERD (gastroesophageal reflux disease)   . Goiter   . Headache(784.0)    tension headache daily per pt   . Headaches, cluster   . Heart murmur   . Hiatal hernia   . Hypertension   . Hypothyroid   . Kidney cancer, primary, with metastasis from kidney to other site [facility])   . Macular degeneration   . Osteoarthritis   . Pancreatitis, acute 11/15/2012  . Peripheral vascular disease ([place])    varicose veins in left arm   . PONV (postoperative  nausea and vomiting)   . Shortness of breath    with exertion   . TMJ (dislocation of temporomandibular joint)     Allergies Allergies  Allergen Reactions  . Tape Rash    Blisters, nausea, and vomitting  . Latex Other (See Comments)    blister  . Lidocaine Nausea And Vomiting  . Sulfur Nausea And Vomiting    IV Location/Drains/Wounds Patient Lines/Drains/Airways Status   Active Line/Drains/Airways    Name:   Placement date:   Placement time:   Site:   Days:   Peripheral IV 02/02/18 Left Wrist   02/02/18    0909    Wrist   less than 1   Incision (Closed) 05/30/15 Thigh Right   05/30/15    2137     979          Labs/Imaging Results for orders placed or performed during the hospital encounter of 02/02/18 (from the past 48 hour(s))  Basic metabolic panel     Status: Abnormal   Collection Time: 02/02/18  9:27 AM  Result Value Ref Range   Sodium 135 135 - 145 mmol/L   Potassium 4.2 3.5 - 5.1 mmol/L   Chloride 98 98 - 111 mmol/L   CO2 26 22 - 32 mmol/L   Glucose, Bld 174 (H) 70 - 99 mg/dL   BUN 18 8 -  23 mg/dL   Creatinine, Ser 0.89 0.44 - 1.00 mg/dL   Calcium 8.9 8.9 - 10.3 mg/dL   GFR calc non Af Amer 60 (L) >60 mL/min   GFR calc Af Amer >60 >60 mL/min   Anion gap 11 5 - 15    Comment: Performed at [facility], [place] [address], [place], [place] [address]  CBC     Status: Abnormal   Collection Time: 02/02/18  9:27 AM  Result Value Ref Range   WBC 15.4 (H) 4.0 - 10.5 K/uL   RBC 2.39 (L) 3.87 - 5.11 MIL/uL   Hemoglobin 7.6 (L) 12.0 - 15.0 g/dL   HCT 24.7 (L) 36.0 - 46.0 %   MCV 103.3 (H) 80.0 - 100.0 fL   MCH 31.8 26.0 - 34.0 pg   MCHC 30.8 30.0 - 36.0 g/dL   RDW 12.9 11.5 - 15.5 %   Platelets 517 (H) 150 - 400 K/uL   nRBC 0.0 0.0 - 0.2 %    Comment: Performed at [facility], [place] [address] [place], [place] [address]  CBG monitoring, ED     Status: Abnormal   Collection Time: 02/02/18  9:35 AM  Result Value Ref Range    Glucose-Capillary 159 (H) 70 - 99 mg/dL  Urinalysis, Routine w reflex microscopic     Status: Abnormal   Collection Time: 02/02/18  9:43 AM  Result Value Ref Range   Color, Urine STRAW (A) YELLOW   APPearance CLEAR CLEAR   Specific Gravity, Urine 1.005 1.005 - 1.030   pH 7.0 5.0 - 8.0   Glucose, UA NEGATIVE NEGATIVE mg/dL   Hgb urine dipstick NEGATIVE NEGATIVE   Bilirubin Urine NEGATIVE NEGATIVE   Ketones, ur NEGATIVE NEGATIVE mg/dL   Protein, ur NEGATIVE NEGATIVE mg/dL   Nitrite NEGATIVE NEGATIVE   Leukocytes, UA TRACE (A) NEGATIVE   RBC / HPF 0-5 0 - 5 RBC/hpf   WBC, UA 0-5 0 - 5 WBC/hpf   Bacteria, UA RARE (A) NONE SEEN   Squamous Epithelial / LPF 0-5 0 - 5   Mucus PRESENT     Comment: Performed at [facility], [place] [address] [place], [place] [address]   No results found.  Pending Labs [employer] (From admission, onward)    Start     Ordered   Signed and Held  Comprehensive metabolic panel  Tomorrow morning,   R     Signed and Held   Signed and Held  CBC  Tomorrow morning,   R     Signed and Held   Signed and Held  Hemoglobin and hematocrit, blood  5A & 5P,   R     Signed and Held          Vitals/Pain Today's Vitals   02/02/18 1442 02/02/18 1458 02/02/18 1500 02/02/18 1530  BP: (!) 181/89  (!) 157/92 (!) 159/90  Pulse: 94  91 89  Resp: (!) 34  (!) 24 18  Temp:      TempSrc:      SpO2: 96%  100% 94%  Weight:      Height:      PainSc:  5       Isolation Precautions No active isolations  Medications Medications  sodium chloride flush (NS) 0.9 % injection 3 mL (has no administration in time range)  metoprolol succinate (TOPROL-XL) 24 hr tablet 50 mg (50 mg Oral Given 02/02/18 1315)  acetaminophen (TYLENOL) tablet 650 mg (650 mg Oral  Given 02/02/18 1458)    Or  acetaminophen (TYLENOL) suppository 650 mg ( Rectal See Alternative 02/02/18 1458)  sodium chloride 0.9 % bolus 500 mL (0 mLs Intravenous Stopped 02/02/18 1113)     Mobility walks with device

## 2018-02-02 NOTE — ED Notes (Signed)
Bed: WA14 Expected date:  Expected time:  Means of arrival:  Comments: EMS 

## 2018-02-02 NOTE — ED Triage Notes (Addendum)
Per EMS patient c/o weakness, dizziness.  States upon waking this morning she felt as though she was going to pass out.  Reports two days ago she had an acute onset of HTN with nosebleed.  Patient currently with rhino rocket on the left side which is to be removed Tuesday.  Seen at [facility] yesterday for nosebleed.

## 2018-02-02 NOTE — H&P (Addendum)
[facility] History and Physical  [name] MWU:[id number] DOB: 10-06-1932 DOA: 02/02/2018  Referring physician: ED  PCP: [name], MD   Chief Complaint: Weakness, dizziness, near fainting  HPI: [name] is a 85 y.o. female with past medical history of colon cancer with recently diagnosed left kidney mass, epistaxis 2 days back, hypertension, peripheral vascular disease, hypothyroidism, GERD, diabetes mellitus type 2, depression presented to the hospital with complaints of sudden onset of weakness dizziness and near fainting episode while at breakfast.  She was standing while she had this issue.  Patient denied any chest pain palpitation or diaphoresis.  Denied any loss of consciousness or loss of posture.  Of note, patient recently had nasal bleed and had come to the ED and had packing done.  Patient denied further bleeding after the packing was performed.  Patient denies any fever, chills or rigors.  Denies sick contacts or recent travel.  Patient denies any nausea, vomiting hematemesis or melena.  She has not had a bowel movement in 2 days.  Patient denies urinary urgency, frequency or dysuria.  No mention of hematuria.  No history of shortness of breath, cough orthopnea or PND.  ED Course: In the ED, patient was noted to be slightly orthostatic.  WBC is elevated at 15.4 with a hemoglobin of 7.6 slight drop from 9.2.  Due to presyncopal symptoms and orthostatic hypotension, patient was then considered for admission to the hospital for observation.  Review of Systems:  All systems were reviewed and were negative unless otherwise mentioned in the HPI  Past Medical History:  Diagnosis Date  . Abnormal uterine bleeding (AUB)    on cont HRT  . Anemia    hx of in 2003   . Arthritis   . Blood transfusion without reported diagnosis   . Calcification of aorta (HCC) 11/16/2012  . Choledocholithiasis with obstruction 11/16/2012  . Colon cancer [facility])    s/p  resecton of colon in 2003 , hx of chemo  . Colon cancer ([place])   . Compression fracture jan 2012   due to fall  . Depression   . DJD (degenerative joint disease)   . DM II (diabetes mellitus, type II), controlled 11/16/2012  . Fibromyalgia   . GERD (gastroesophageal reflux disease)   . Goiter   . Headache(784.0)    tension headache daily per pt   . Headaches, cluster   . Heart murmur   . Hiatal hernia   . Hypertension   . Hypothyroid   . Kidney cancer, primary, with metastasis from kidney to other site [facility])   . Macular degeneration   . Osteoarthritis   . Pancreatitis, acute 11/15/2012  . Peripheral vascular disease ([place])    varicose veins in left arm   . PONV (postoperative nausea and vomiting)   . Shortness of breath    with exertion   . TMJ (dislocation of temporomandibular joint)    Past Surgical History:  Procedure Laterality Date  . APPENDECTOMY    . BACK SURGERY  2000   lower  . CHOLECYSTECTOMY N/A 11/17/2012   Procedure: LAPAROSCOPIC CHOLECYSTECTOMY;  Surgeon: Leighton Ruff, MD;  Location: [facility];  Service: General;  Laterality: N/A;  . COLON RESECTION  2003  . COLONOSCOPY W/ BIOPSIES  09/25/2002, 09/18/2001  . DILATION AND CURETTAGE OF UTERUS    . ERCP N/A 11/14/2012   Procedure: ENDOSCOPIC RETROGRADE CHOLANGIOPANCREATOGRAPHY (ERCP);  Surgeon: Jeryl Columbia, MD;  Location: [facility];  Service: Endoscopy;  Laterality: N/A;  .  ESOPHAGOGASTRODUODENOSCOPY ENDOSCOPY    . EYE SURGERY     bilateral cataract surgery   . HYSTEROSCOPY    . JOINT REPLACEMENT Right dec 2012   knee  . porta catheter placement  10/30/2001  . porta catheter removal  06/27/2003  . TMJ ARTHROPLASTY  1990's  . TMJ ARTHROPLASTY    . TONSILLECTOMY  age 10 or 16  . TOTAL HIP ARTHROPLASTY Right 05/30/2015   Procedure: TOTAL HIP ARTHROPLASTY ANTERIOR APPROACH;  Surgeon: Rod Can, MD;  Location: [place];  Service: Orthopedics;  Laterality: Right;  . TOTAL KNEE ARTHROPLASTY  12/20/2010   Procedure:  TOTAL KNEE ARTHROPLASTY;  Surgeon: Mauri Pole;  [facility];  Service: Orthopedics;  Laterality: Right;  . UPPER GASTROINTESTINAL ENDOSCOPY  09/18/2001  . VERTEBROPLASTY  2012    Social History:  reports that she has never smoked. She has never used smokeless tobacco. She reports that she does not drink alcohol or use drugs.  Allergies  Allergen Reactions  . Tape Rash    Blisters, nausea, and vomitting  . Latex Other (See Comments)    blister  . Lidocaine Nausea And Vomiting  . Sulfur Nausea And Vomiting    Family History  Problem Relation Age of Onset  . Heart disease Mother        Enlarged heart  . Arrhythmia Mother   . Diabetes Mother   . Heart disease Brother        MI at unknown age  . Anesthesia problems Brother   . Heart disease Father   . Cancer Son        non-hodgkins lymphoma  . Stroke Sister   . Heart attack Other        MI at age 1  . Heart disease Brother        CABG     Prior to Admission medications   Medication Sig Start Date End Date Taking? Authorizing Provider  acetaminophen (TYLENOL 8 HOUR ARTHRITIS PAIN) 650 MG CR tablet Take 650 mg by mouth every 8 (eight) hours as needed for pain. Take 2 every 8 hours as needed   Yes [provider]  amoxicillin (AMOXIL) 500 MG capsule Take 1 capsule (500 mg total) by mouth 3 (three) times daily. 02/01/18  Yes Rolland Porter, MD  aspirin EC 81 MG EC tablet Take 1 tablet (81 mg total) by mouth 2 (two) times daily after a meal. 06/01/15  Yes Swinteck, Aaron Edelman, MD  Calcium-Vitamin D (CALTRATE 600 PLUS-VIT D PO) Take 1 tablet by mouth 2 (two) times daily.    Yes [provider]  docusate sodium (COLACE) 100 MG capsule Take 200 mg by mouth daily.    Yes [provider]  estradiol (ESTRACE) 0.5 MG tablet Take 0.25 mg by mouth daily.  05/16/16  Yes [provider]  FAMOTIDINE PO Take 1 tablet by mouth daily.   Yes [provider]  lactobacillus acidophilus (BACID) TABS tablet  Take 1 tablet by mouth 2 (two) times daily.   Yes [provider]  levothyroxine (SYNTHROID, LEVOTHROID) 200 MCG tablet Take 200 mcg by mouth daily before breakfast.   Yes [provider]  medroxyPROGESTERone (PROVERA) 5 MG tablet Take 2.5 mg by mouth daily.   Yes [provider]  metoprolol succinate (TOPROL-XL) 50 MG 24 hr tablet Take 50 mg by mouth daily.    Yes [provider]  Multiple Vitamins-Minerals (CENTRUM SILVER ULTRA WOMENS PO) Take by mouth.   Yes [provider]  Multiple Vitamins-Minerals (PRESERVISION AREDS PO) Take 1 capsule by mouth 2 (two) times daily.    Yes [provider]  nortriptyline (PAMELOR) 25 MG capsule Take 75 mg by mouth every morning.    Yes [provider]  polyvinyl alcohol (LIQUIFILM TEARS) 1.4 % ophthalmic solution Place 1 drop into both eyes as needed for dry eyes.   Yes [provider]  saccharomyces boulardii (FLORASTOR) 250 MG capsule Take 250 mg by mouth daily.   Yes [provider]  simvastatin (ZOCOR) 20 MG tablet Take 20 mg by mouth daily.   Yes [provider]  traMADol (ULTRAM) 50 MG tablet Take 50 mg by mouth every 6 (six) hours as needed for moderate pain.   Yes [provider]  vitamin B-12 (CYANOCOBALAMIN) 1000 MCG tablet Take 1,000 mcg by mouth daily.   Yes [provider]  vitamin C (ASCORBIC ACID) 500 MG tablet Take 500 mg by mouth daily.    Yes [provider]  Vitamin D, Ergocalciferol, (DRISDOL) 50000 units CAPS capsule take ONE CAPSULE BY MOUTH every 7 DAYS Patient taking differently: Take 50,000 Units by mouth as directed. Once a month 08/19/15  Yes Kem Boroughs, FNP  vitamin E 400 UNIT capsule Take 400 Units by mouth 2 (two) times daily.    Yes [provider]  zolpidem (AMBIEN) 10 MG tablet Take 1 tablet (10 mg total) by mouth at bedtime. 06/01/15  Yes Theodis Blaze, MD    Physical Exam: Vitals:   02/02/18 1131  02/02/18 1132 02/02/18 1200 02/02/18 1201  BP: (!) 185/96  (!) 177/94 (!) 177/94  Pulse:   97 99  Resp:  20 (!) 24 20  Temp:      TempSrc:      SpO2:   98% 98%  Weight:      Height:       Wt Readings from Last 3 Encounters:  02/02/18 57.6 kg  02/01/18 57.7 kg  07/07/16 63 kg   Body mass index is 20.5 kg/m.  General: Thinly built, not in obvious distress, mildly anxious HENT: Normocephalic, pupils equally reacting to light and accommodation.  No scleral pallor or icterus noted. Oral mucosa is moist.  Left nasal cavity packing in place. Chest:  Clear breath sounds.  Diminished breath sounds bilaterally. No crackles or wheezes.  CVS: S1 &S2 heard.  Mild systolic murmur.  Regular rate and rhythm. Abdomen: Soft, nontender, nondistended.  Bowel sounds are heard.  Liver is not palpable, no abdominal mass palpated Extremities: No cyanosis, clubbing or edema.  Peripheral pulses are palpable. Psych: Alert, awake and oriented, normal mood CNS:  No cranial nerve deficits.  Power equal in all extremities.   No cerebellar signs.   Skin: Warm and dry.  No rashes noted.  Labs on Admission:  Basic Metabolic Panel: Recent Labs  Lab 02/01/18 0111 02/02/18 0927  NA 136 135  K 4.7 4.2  CL 101 98  CO2 27 26  GLUCOSE 181* 174*  BUN 21 18  CREATININE 0.89 0.89  CALCIUM 8.9 8.9   Liver Function Tests: No results for input(s): AST, ALT, ALKPHOS, BILITOT, PROT, ALBUMIN in the last 168 hours. No results for input(s): LIPASE, AMYLASE in the last 168 hours. No results for input(s): AMMONIA in the last 168 hours. CBC: Recent Labs  Lab 02/01/18 0111 02/01/18 0309 02/02/18 0927  WBC 11.0* 12.2* 15.4*  NEUTROABS 8.5* 9.5*  --   HGB 9.2* 9.3* 7.6*  HCT 30.5* 30.4* 24.7*  MCV  100.3* 100.3* 103.3*  PLT 472* 494* 517*   Cardiac Enzymes: No results for input(s): CKTOTAL, CKMB, CKMBINDEX, TROPONINI in the last 168 hours.  BNP (last 3 results) Recent Labs    12/28/17 1251  BNP 46.0     ProBNP (last 3 results) No results for input(s): PROBNP in the last 8760 hours.  CBG: Recent Labs  Lab 02/02/18 0935  GLUCAP 159*     Radiological Exams on Admission: No results found.  EKG: Personally reviewed by me which shows normal sinus rhythm.  No ischemic changes were noted  Assessment/Plan Principal Problem:   Near syncope Active Problems:   Essential hypertension   Hyperlipidemia   Hypothyroidism   GERD (gastroesophageal reflux disease)   Orthostatic hypotension   Anemia  Near syncopal episode with orthostatic hypotension on presentation.  Her sitting blood pressure was elevated.  This could be secondary to volume depletion from recent epistaxis.  She also states her poor oral intake.  We will continue gentle IV fluid hydration.  Check orthostatic vitals.  2D echocardiogram done on 12/19 showed preserved ejection fraction of 55 to 60%  Hypertension.  Will closely monitor.  Resume outpatient medication.,  Accelerated on presentation.  She does have orthostatic hypotension.  Resume outpatient medication.  Continue volume replacement as well.  Continue metoprolol  History of colon cancer in the past with new left kidney mass.  Was seen by oncology recently.  She has seen urology Dr. Alyson Ingles on 02/01/2018.  As per the patient, plan is possible surgical intervention electively.  Denies hematuria.  Leukocytosis.  Patient does have nasal packing.  She is empirically on amoxicillin for prophylaxis.  We will continue with that. Monitor hemoglobin and CBC  Hypothyroidism.  Continue Synthroid.    Hyperlipidemia. Continue Statins.    Consultant: None  Code Status: Full code  DVT Prophylaxis: SCD due to low hemoglobin and recent epistaxis  Antibiotics: Amoxicillin p.o.-continued from outpatient  Family Communication:  Patients' condition and plan of care including tests being ordered have been discussed with the patient and son who indicate understanding and agree  with the plan.  Disposition Plan: Home  Severity of Illness: The appropriate patient status for this patient is OBSERVATION. Observation status is judged to be reasonable and necessary in order to provide the required intensity of service to ensure the patient's safety. The patient's presenting symptoms, physical exam findings, and initial radiographic and laboratory data in the context of their medical condition is felt to place them at decreased risk for further clinical deterioration. Furthermore, it is anticipated that the patient will be medically stable for discharge from the hospital within 2 midnights of admission. Signed, Flora Lipps, MD [facility] 02/02/2018

## 2018-02-02 NOTE — ED Provider Notes (Signed)
[place] DEPT Provider Note   CSN: [id number] Arrival date & time: 02/02/18  0907     History   Chief Complaint Chief Complaint  Patient presents with  . Weakness    HPI [name] is a 85 y.o. female.  HPI  85 year old female presents with an episode of fatigue and feeling like her whole body was slipping.  She states that this morning as she was getting ready to make breakfast she all of a sudden felt like her whole body was slipping down to the floor though she was not moving.  She felt diffusely weak.  She had to go back and lay down in bed.  Overall she felt poorly for about an hour.  After resting she has felt better.  Prior to me seeing her she has walked to the bathroom in the ED and states she felt a little shaky but was otherwise feeling better.  No chest pain, headache, focal weakness or numbness, or shortness of breath.  She was seen in the [facility] emergency department about 2 days ago for nose bleeding.  She had nasal packing placed on the left.  Since this packing, she has not felt any blood running down the back of her throat or any new epistaxis.  She states when it originally occurred she did swallow a decent amount of blood.  She has not noticed any melena or hematochezia.  Past Medical History:  Diagnosis Date  . Abnormal uterine bleeding (AUB)    on cont HRT  . Anemia    hx of in 2003   . Arthritis   . Blood transfusion without reported diagnosis   . Calcification of aorta (HCC) 11/16/2012  . Choledocholithiasis with obstruction 11/16/2012  . Colon cancer [facility])    s/p resecton of colon in 2003 , hx of chemo  . Colon cancer ([place])   . Compression fracture jan 2012   due to fall  . Depression   . DJD (degenerative joint disease)   . DM II (diabetes mellitus, type II), controlled 11/16/2012  . Fibromyalgia   . GERD (gastroesophageal reflux disease)   . Goiter   . Headache(784.0)    tension headache daily per pt     . Headaches, cluster   . Heart murmur   . Hiatal hernia   . Hypertension   . Hypothyroid   . Kidney cancer, primary, with metastasis from kidney to other site [facility])   . Macular degeneration   . Osteoarthritis   . Pancreatitis, acute 11/15/2012  . Peripheral vascular disease ([place])    varicose veins in left arm   . PONV (postoperative nausea and vomiting)   . Shortness of breath    with exertion   . TMJ (dislocation of temporomandibular joint)     Patient Active Problem List   Diagnosis Date Noted  . Suprapubic discomfort 07/06/2017  . Vitamin D deficiency 08/19/2015  . Closed right hip fracture ([place]) 05/30/2015  . Chest pain syndrome 05/30/2015  . Fall at home 05/30/2015  . Closed right femoral fracture ([place]) 05/30/2015  . Displaced fracture of right femoral neck ([place]) 05/30/2015  . Fall   . Cough 07/02/2013  . Other dyspnea and respiratory abnormality 07/02/2013  . History of colon cancer, stage III 07/02/2013  . Chronic cholecystitis with calculus s/p lap chole 11/17/2012 11/19/2012  . GERD (gastroesophageal reflux disease) 11/16/2012  . Choledocholithiasis with obstruction 11/16/2012  . Calcification of aorta (HCC) 11/16/2012  . Atherosclerosis 11/16/2012  .  Hypothyroidism 11/15/2012  . Nonspecific (abnormal) findings on radiological and other examination of gastrointestinal tract 11/08/2012  . S/P right knee replacement 12/22/2010  . Preop cardiovascular exam 12/08/2010  . Essential hypertension 12/08/2010  . Hyperlipidemia 12/08/2010    Past Surgical History:  Procedure Laterality Date  . APPENDECTOMY    . BACK SURGERY  2000   lower  . CHOLECYSTECTOMY N/A 11/17/2012   Procedure: LAPAROSCOPIC CHOLECYSTECTOMY;  Surgeon: Leighton Ruff, MD;  [facility];  Service: General;  Laterality: N/A;  . COLON RESECTION  2003  . COLONOSCOPY W/ BIOPSIES  09/25/2002, 09/18/2001  . DILATION AND CURETTAGE OF UTERUS    . ERCP N/A 11/14/2012   Procedure: ENDOSCOPIC RETROGRADE  CHOLANGIOPANCREATOGRAPHY (ERCP);  Surgeon: Jeryl Columbia, MD;  [facility];  Service: Endoscopy;  Laterality: N/A;  . ESOPHAGOGASTRODUODENOSCOPY ENDOSCOPY    . EYE SURGERY     bilateral cataract surgery   . HYSTEROSCOPY    . JOINT REPLACEMENT Right dec 2012   knee  . porta catheter placement  10/30/2001  . porta catheter removal  06/27/2003  . TMJ ARTHROPLASTY  1990's  . TMJ ARTHROPLASTY    . TONSILLECTOMY  age 80 or 54  . TOTAL HIP ARTHROPLASTY Right 05/30/2015   Procedure: TOTAL HIP ARTHROPLASTY ANTERIOR APPROACH;  Surgeon: Rod Can, MD;  Location: [place];  Service: Orthopedics;  Laterality: Right;  . TOTAL KNEE ARTHROPLASTY  12/20/2010   Procedure: TOTAL KNEE ARTHROPLASTY;  Surgeon: Mauri Pole;  [facility];  Service: Orthopedics;  Laterality: Right;  . UPPER GASTROINTESTINAL ENDOSCOPY  09/18/2001  . VERTEBROPLASTY  2012     OB History    Gravida  1   Para  1   Term  1   Preterm  0   AB  0   Living  1     SAB  0   TAB  0   Ectopic  0   Multiple  0   Live Births  1            Home Medications    Prior to Admission medications   Medication Sig Start Date End Date Taking? Authorizing Provider  acetaminophen (TYLENOL 8 HOUR ARTHRITIS PAIN) 650 MG CR tablet Take 650 mg by mouth every 8 (eight) hours as needed for pain. Take 2 every 8 hours as needed    [provider]  amoxicillin (AMOXIL) 500 MG capsule Take 1 capsule (500 mg total) by mouth 3 (three) times daily. 02/01/18   Rolland Porter, MD  aspirin EC 81 MG EC tablet Take 1 tablet (81 mg total) by mouth 2 (two) times daily after a meal. 06/01/15   Swinteck, Aaron Edelman, MD  Calcium-Vitamin D (CALTRATE 600 PLUS-VIT D PO) Take 1 tablet by mouth 2 (two) times daily.     [provider]  docusate sodium (COLACE) 100 MG capsule Take 100 mg by mouth 3 (three) times daily as needed for constipation.    [provider]  estradiol (ESTRACE) 0.5 MG tablet Take 0.5 mg by mouth daily.  05/16/16   [provider]  lactobacillus acidophilus (BACID) TABS tablet Take 1 tablet by mouth 2 (two) times daily.    [provider]  levothyroxine (SYNTHROID, LEVOTHROID) 200 MCG tablet Take 200 mcg by mouth daily before breakfast.    [provider]  metoprolol succinate (TOPROL-XL) 50 MG 24 hr tablet metoprolol succinate ER 50 mg tablet,extended release 24 hr    [provider]  Multiple Vitamins-Minerals (CENTRUM  SILVER ULTRA WOMENS PO) Take by mouth.    [provider]  Multiple Vitamins-Minerals (PRESERVISION AREDS PO) Take 2 capsules by mouth daily.    [provider]  nortriptyline (PAMELOR) 25 MG capsule Take 75 mg by mouth every morning.     [provider]  vitamin B-12 (CYANOCOBALAMIN) 1000 MCG tablet Take 1,000 mcg by mouth daily.    [provider]  vitamin C (ASCORBIC ACID) 500 MG tablet Take 500 mg by mouth daily.     [provider]  Vitamin D, Ergocalciferol, (DRISDOL) 50000 units CAPS capsule take ONE CAPSULE BY MOUTH every 7 DAYS Patient taking differently: take ONE CAPSULE BY MOUTH every 14 days 08/19/15   Kem Boroughs, FNP  vitamin E 400 UNIT capsule Take 400 Units by mouth 2 (two) times daily.     [provider]  zolpidem (AMBIEN) 10 MG tablet Take 1 tablet (10 mg total) by mouth at bedtime. 06/01/15   Theodis Blaze, MD    Family History Family History  Problem Relation Age of Onset  . Heart disease Mother        Enlarged heart  . Arrhythmia Mother   . Diabetes Mother   . Heart disease Brother        MI at unknown age  . Anesthesia problems Brother   . Heart disease Father   . Cancer Son        non-hodgkins lymphoma  . Stroke Sister   . Heart attack Other        MI at age 42  . Heart disease Brother        CABG    Social History Social History   Tobacco Use  . Smoking status: Never Smoker  . Smokeless tobacco: Never Used  Substance Use Topics  . Alcohol use:  No  . Drug use: No     Allergies   Tape; Latex; Lidocaine; and Sulfur   Review of Systems Review of Systems  Constitutional: Negative for fever.  HENT: Negative for nosebleeds.   Respiratory: Negative for shortness of breath.   Cardiovascular: Negative for chest pain.  Gastrointestinal: Negative for vomiting.  Neurological: Positive for weakness and light-headedness. Negative for syncope and headaches.  All other systems reviewed and are negative.    Physical Exam Updated Vital Signs BP (!) 179/96   Pulse (!) 102   Temp 97.9 F (36.6 C) (Oral)   Resp 16   Ht 5\' 6"  (1.676 m)   Wt 57.6 kg   LMP 01/17/1994   SpO2 99%   BMI 20.50 kg/m   Physical Exam Vitals signs and nursing note reviewed.  Constitutional:      Appearance: She is well-developed.  HENT:     Head: Normocephalic and atraumatic.     Comments: Left nasal packing in place.  No obvious bleeding.    Right Ear: External ear normal.     Left Ear: External ear normal.     Nose: Nose normal.     Mouth/Throat:     Mouth: Mucous membranes are dry.  Eyes:     General:        Right eye: No discharge.        Left eye: No discharge.     Extraocular Movements: Extraocular movements intact.     Pupils: Pupils are equal, round, and reactive to light.  Cardiovascular:     Rate and Rhythm: Normal rate and regular rhythm.     Heart sounds: Normal heart sounds.  Pulmonary:     Effort: Pulmonary effort is normal.     Breath sounds: Normal breath sounds.  Abdominal:     Palpations: Abdomen is soft.     Tenderness: There is no abdominal tenderness.  Skin:    General: Skin is warm and dry.  Neurological:     Mental Status: She is alert and oriented to person, place, and time.     Comments: CN 3-12 grossly intact. 5/5 strength in all 4 extremities. Grossly normal sensation. Normal finger to nose.   Psychiatric:        Mood and Affect: Mood is not anxious.      ED Treatments / Results  Labs (all labs ordered  are listed, but only abnormal results are displayed) Labs Reviewed  CBC - Abnormal; Notable for the following components:      Result Value   WBC 15.4 (*)    RBC 2.39 (*)    Hemoglobin 7.6 (*)    HCT 24.7 (*)    MCV 103.3 (*)    Platelets 517 (*)    All other components within normal limits  URINALYSIS, ROUTINE W REFLEX MICROSCOPIC - Abnormal; Notable for the following components:   Color, Urine STRAW (*)    Leukocytes, UA TRACE (*)    Bacteria, UA RARE (*)    All other components within normal limits  CBG MONITORING, ED - Abnormal; Notable for the following components:   Glucose-Capillary 159 (*)    All other components within normal limits  BASIC METABOLIC PANEL    EKG EKG Interpretation  Date/Time:  Friday February 02 2018 09:40:50 EST Ventricular Rate:  97 PR Interval:    QRS Duration: 103 QT Interval:  357 QTC Calculation: 454 R Axis:   14 Text Interpretation:  Sinus rhythm Abnormal R-wave progression, early transition no significant change since May 2017 Confirmed by Sherwood Gambler [phone]) on 02/02/2018 9:44:03 AM   Radiology No results found.  Procedures Procedures (including critical care time)  Medications Ordered in ED Medications  sodium chloride flush (NS) 0.9 % injection 3 mL (has no administration in time range)  sodium chloride 0.9 % bolus 500 mL (has no administration in time range)     Initial Impression / Assessment and Plan / ED Course  I have reviewed the triage vital signs and the nursing notes.  Pertinent labs & imaging results that were available during my care of the patient were reviewed by me and considered in my medical decision making (see chart for details).     Patient states she currently feels shaky but otherwise feels okay.  Her hemoglobin is noted to be dropped about two-point since 2 days ago though she has not had any bleeding since leaving the emergency department.  Given the near syncopal episode, it could be due to the  anemia but the time course does not make sense given that she has not had any new bleeding.  I think is reasonable to admit her for observation and further near syncope work-up.  Final Clinical Impressions(s) / ED Diagnoses   Final diagnoses:  Near syncope  Symptomatic anemia    ED Discharge Orders    None       Sherwood Gambler, MD 02/02/18 1614

## 2018-02-03 DIAGNOSIS — R55 Syncope and collapse: Secondary | ICD-10-CM | POA: Diagnosis not present

## 2018-02-03 LAB — COMPREHENSIVE METABOLIC PANEL
ALT: 14 U/L (ref 0–44)
AST: 17 U/L (ref 15–41)
Albumin: 3 g/dL — ABNORMAL LOW (ref 3.5–5.0)
Alkaline Phosphatase: 71 U/L (ref 38–126)
Anion gap: 11 (ref 5–15)
BILIRUBIN TOTAL: 0.6 mg/dL (ref 0.3–1.2)
BUN: 12 mg/dL (ref 8–23)
CO2: 28 mmol/L (ref 22–32)
Calcium: 8.7 mg/dL — ABNORMAL LOW (ref 8.9–10.3)
Chloride: 97 mmol/L — ABNORMAL LOW (ref 98–111)
Creatinine, Ser: 0.88 mg/dL (ref 0.44–1.00)
GFR calc Af Amer: >60 mL/min (ref >60)
Glucose, Bld: 159 mg/dL — ABNORMAL HIGH (ref 70–99)
Potassium: 3.8 mmol/L (ref 3.5–5.1)
Sodium: 136 mmol/L (ref 135–145)
Total Protein: 7.7 g/dL (ref 6.5–8.1)

## 2018-02-03 LAB — CBC
HEMATOCRIT: 31.1 % — AB (ref 36.0–46.0)
Hemoglobin: 9.4 g/dL — ABNORMAL LOW (ref 12.0–15.0)
MCH: 31.2 pg (ref 26.0–34.0)
MCHC: 30.2 g/dL (ref 30.0–36.0)
MCV: 103.3 fL — ABNORMAL HIGH (ref 80.0–100.0)
Platelets: 540 10*3/uL — ABNORMAL HIGH (ref 150–400)
RBC: 3.01 MIL/uL — ABNORMAL LOW (ref 3.87–5.11)
RDW: 13 % (ref 11.5–15.5)
WBC: 12.4 10*3/uL — AB (ref 4.0–10.5)
nRBC: 0 % (ref 0.0–0.2)

## 2018-02-03 MED ORDER — RISAQUAD PO CAPS
1.0000 | ORAL_CAPSULE | Freq: Every day | ORAL | Status: DC
  Filled 2018-02-03: qty 1

## 2018-02-03 NOTE — Discharge Summary (Signed)
**Note De-Identified vi Obfusction** Physicin Dischrge Summry  HILA BOLDING IEP:[id number] DOB: [birth year] DOA: 02/02/2018  PCP: [name], MD  Admit dte: 02/02/2018 Dischrge dte: 02/03/2018  Time spent: 34 minutes  Recommendtions for Outptient Follow-up:  1. Follow up outptient CBC/CMP 2. Continue to follow blood pressures, pt orthosttic with BP with supine hypertension.  She denied symptoms on the dy of dischrge.  Follow up outptient.  3. Ensure follow up with oncology, urology s scheduled 4. Ensure follow up with Dr. Benjmine Mol with ENT  Dischrge Dignoses:  Principl Problem:   Ner syncope Active Problems:   Essentil hypertension   Hyperlipidemi   Hypothyroidism   GERD (gstroesophgel reflux disese)   Orthosttic hypotension   Anemi   Dischrge Condition: stble  Diet recommendtion: hert helthy  Filed Weights   02/02/18 0925  Weight: 57.6 kg    History of present illness:  [name] is  85 y.o. femle with pst medicl history of colon cncer with recently dignosed left kidney mss, epistxis 2 dys bck, hypertension, peripherl vsculr disese, hypothyroidism, GERD, dibetes mellitus type 2, depression presented to the hospitl with complints of sudden onset of wekness dizziness nd ner finting episode while t brekfst.  She ws stnding while she hd this issue.  Ptient denied ny chest pin plpittion or diphoresis.  Denied ny loss of consciousness or loss of posture.  Of note, ptient recently hd nsl bleed nd hd come to the ED nd hd pcking done.  Ptient denied further bleeding fter the pcking ws performed.  Ptient denies ny fever, chills or rigors.  Denies sick contcts or recent trvel.  Ptient denies ny nuse, vomiting hemtemesis or melen.  She hs not hd  bowel movement in 2 dys.  Ptient denies urinry urgency, frequency or dysuri.  No mention of hemturi.  No history of shortness of breth, cough orthopne or PND.  She ws  dmitted for symptoms of presyncope nd wekness.  She ws noted to be orthosttic.  She's improved with IVF.  Her symptoms tody hve resolved nd she denies ny lighthededness with stnding or symptoms of orthostsis.  She ws dischrged with instructions to follow up with her PCP.  See below for further detils  Hospitl Course:  Presyncope  Orthosttic Hypotension  Mlise:  Pt described to me  feeling of generlized wekness (feeling of sinking to the floor, she didn't clerly describe presyncope to me) .  She denied lighthededness or  feeling tht she ws going to fint.  She ws noted to be orthosttic with supine hypertension.   She notes her symptoms hve resolved tody with IVF.  EKG ws similr to priors. Recent echo ws notble for grde 1 distolic dysfunction.  Her hemoglobin ws stble.  She'll need to follow up s n outptient for her supine hypertension nd orthostsis.  Symptoms resolved t dischrge.  Hypertension.  Improved on dischrge, continue to follow outptient nd djust s needed  History of colon cncer in the pst with new left kidney mss.  Ws seen by oncology recently.  She hs seen urology Dr. Alyson Ingles.  She'll need to continue follow up s scheduled.  Leukocytosis.  Ptient does hve nsl pcking.  She is empiriclly on moxicillin for prophylxis.  Afebrile.   Hypothyroidism.  Continue Synthroid.    Hyperlipidemi. Continue Sttins.   Procedures:  noen   Consulttions:  none  Dischrge Exm: Vitls:   02/03/18 0438 02/03/18 1354  BP: (!) 179/99 (!) 149/84  Pulse: (!) 108 95  Resp: 18 16 **Note De-Identified vi Obfusction** Temp: 98 F (36.7 C) 98.4 F (36.9 C)  SpO2: 95% 99%   Feels better. Hd sinking feeling. Felt wek.  Denies LH or feeling like she ws going to fint. Feels better fter IVF. Redy to go home.  Generl: No cute distress. Crdiovsculr: Hert sounds show  regulr rte, nd rhythm.  Lungs: Cler to usculttion bilterlly Abdomen: Soft,  nontender, nondistended  Neurologicl: Alert nd oriented 3. Moves ll extremities 4 Crnil nerves II through XII grossly intct. Skin: Wrm nd dry. No rshes or lesions. Extremities: No clubbing or cynosis. No edem Psychitric: Mood nd ffect re norml. Insight nd judgment re pproprite.  Dischrge Instructions   Dischrge Instructions    Cll MD for:  difficulty brething, hedche or visul disturbnces   Complete by:  As directed    Cll MD for:  extreme ftigue   Complete by:  As directed    Cll MD for:  hives   Complete by:  As directed    Cll MD for:  persistnt dizziness or light-hededness   Complete by:  As directed    Cll MD for:  persistnt nuse nd vomiting   Complete by:  As directed    Cll MD for:  redness, tenderness, or signs of infection (pin, swelling, redness, odor or green/yellow dischrge round incision site)   Complete by:  As directed    Cll MD for:  severe uncontrolled pin   Complete by:  As directed    Cll MD for:  temperture >100.4   Complete by:  As directed    Diet - low sodium hert helthy   Complete by:  As directed    Dischrge instructions   Complete by:  As directed    You were seen for presyncope (ner finting) nd generl ftigue.  You've improved tody.  Your blood pressures were high here, this will need close follow up s n outptient.  They've improved now, but it should be followed closely.  Plese follow up with your primry cre provider regrding this episode.  Follow up with urology, oncology, nd ENT s plnned.  Return for new, recurrent, or worsening symptoms.  Plese sk your PCP to request records from this hospitliztion so they know wht ws done nd wht the next steps will be.   Increse ctivity slowly   Complete by:  As directed      Allergies s of 02/03/2018      Rections   Tpe Rsh   Blisters, nuse, nd vomitting   Ltex Other (See Comments)   blister   Lidocine Nuse And  Vomiting   Sulfur Nuse And Vomiting      Mediction List    TAKE these medictions   moxicillin 500 MG cpsule Commonly known s:  AMOXIL Tke 1 cpsule (500 mg totl) by mouth 3 (three) times dily.   spirin 81 MG EC tblet Tke 1 tblet (81 mg totl) by mouth 2 (two) times dily fter  mel.   CALTRATE 600 PLUS-VIT D PO Tke 1 tblet by mouth 2 (two) times dily.   CENTRUM SILVER ULTRA WOMENS PO Tke by mouth.   PRESERVISION AREDS PO Tke 1 cpsule by mouth 2 (two) times dily.   docuste sodium 100 MG cpsule Commonly known s:  COLACE Tke 200 mg by mouth dily.   estrdiol 0.5 MG tblet Commonly known s:  ESTRACE Tke 0.25 mg by mouth dily.   FAMOTIDINE PO Tke 1 tblet by mouth dily.   lctobcillus cidophilus Tbs tblet Tke **Note De-Identified vi Obfusction** 1 tblet by mouth 2 (two) times dily.   levothyroxine 200 MCG tblet Commonly known s:  SYNTHROID, LEVOTHROID Tke 200 mcg by mouth dily before brekfst.   medroxyPROGESTERone 5 MG tblet Commonly known s:  PROVERA Tke 2.5 mg by mouth dily.   metoprolol succinte 50 MG 24 hr tblet Commonly known s:  TOPROL-XL Tke 50 mg by mouth dily.   nortriptyline 25 MG cpsule Commonly known s:  PAMELOR Tke 75 mg by mouth every morning.   polyvinyl lcohol 1.4 % ophthlmic solution Commonly known s:  LIQUIFILM TEARS Plce 1 drop into both eyes s needed for dry eyes.   scchromyces boulrdii 250 MG cpsule Commonly known s:  FLORASTOR Tke 250 mg by mouth dily.   simvsttin 20 MG tblet Commonly known s:  ZOCOR Tke 20 mg by mouth dily.   trMADol 50 MG tblet Commonly known s:  ULTRAM Tke 50 mg by mouth every 6 (six) hours s needed for moderte pin.   TYLENOL 8 HOUR ARTHRITIS PAIN 650 MG CR tblet Generic drug:  cetminophen Tke 650 mg by mouth every 8 (eight) hours s needed for pin. Tke 2 every 8 hours s needed   vitmin B-12 1000 MCG tblet Commonly known s:  CYANOCOBALAMIN Tke 1,000  mcg by mouth dily.   vitmin C 500 MG tblet Commonly known s:  ASCORBIC ACID Tke 500 mg by mouth dily.   Vitmin D (Ergoclciferol) 1.25 MG (50000 UT) Cps cpsule Commonly known s:  DRISDOL tke ONE CAPSULE BY MOUTH every 7 DAYS Wht chnged:    how much to tke  how to tke this  when to tke this  dditionl instructions   vitmin E 400 UNIT cpsule Tke 400 Units by mouth 2 (two) times dily.   zolpidem 10 MG tblet Commonly known s:  AMBIEN Tke 1 tblet (10 mg totl) by mouth t bedtime.      Allergies  Allergen Rections  . Tpe Rsh    Blisters, nuse, nd vomitting  . Ltex Other (See Comments)    blister  . Lidocine Nuse And Vomiting  . Sulfur Nuse And Vomiting      The results of significnt dignostics from this hospitliztion (including imging, microbiology, ncillry nd lbortory) re listed below for reference.    Significnt Dignostic Studies: Dg Esophgus  Result Dte: 01/16/2018 CLINICAL DATA:  Dysphgi EXAM: ESOPHOGRAM/BARIUM SWALLOW TECHNIQUE: Single contrst exmintion ws performed using  thin brium. FLUOROSCOPY TIME:  Fluoroscopy Time:  2 minutes 6 second Rdition Exposure Index (if provided by the fluoroscopic device): 93 mGy Number of Acquired Spot Imges: 0 COMPARISON:  None. FINDINGS: Initilly rpid sequence spot films of the cervicl esophgus were obtined in the lterl nd frontl projections. There is no evidence of penetrtion or spirtion. The ptient gives  history of the thyroid goiter, but no significnt impingement upon the lower cervicl esophgus is seen. There is mild nrrowing of the lower cervicl esophgus consistent with mild bleb. Imges of esophgel peristlsis show moderte tertiry contrctions in the mid nd distl esophgus. No definite hitl herni could be detected. No gstroesophgel reflux is seen with the wter siphon mneuver. The ptient ws given  brium pill t the end of the study.  The pill did initilly lodged within the vllecul, but fter repeted swllowing, the pill did pss into the stomch without significnt dely distlly. IMPRESSION: 1. Mild to moderte tertiry contrctions in the mid nd distl esophgus. 2. No evidence of spirtion or penetrtion. 3. Brium pill lodges  in the vallecula but does pass into the stomach after repeated swallows without delay. 4. No definite hiatal hernia or gastroesophageal reflux is seen. Electronically Signed   By: Ivar Drape M.D.   On: 01/16/2018 09:50   Nm Pet Image Initial (pi) Skull Base To Thigh  Result Date: 01/18/2018 CLINICL DT:  Initial treatment strategy for left kidney cancer. EXM: NUCLER MEDICINE PET SKULL BSE TO THIGH TECHNIQUE: 6.6 mCi F-18 FDG was injected intravenously. Full-ring PET imaging was performed from the skull base to thigh after the radiotracer. CT data was obtained and used for attenuation correction and anatomic localization. Fasting blood glucose: 107 mg/dl COMPRISON:  CT abdomen/pelvis dated 01/03/2018. CT chest dated 01/01/2018. FINDINGS: Mediastinal blood pool activity: SUV max 2.3 NECK: No hypermetabolic cervical lymphadenopathy. Incidental CT findings: None CHEST: No hypermetabolic thoracic lymphadenopathy. Mild branching nodularity in the lateral right upper lobe (series 8/image 26), max SUV 1.1, likely infectious/inflammatory. Mild subpleural reticulation in the posterior right lower lobe (series 8/image 42), with associated mild hypermetabolism, max SUV 2.1. This appearance is not considered suspicious for metastasis. Incidental CT findings: Mild ectasia the ascending thoracic aorta, measuring 3.3 cm. therosclerotic calcifications of the aortic arch. Three vessel coronary atherosclerosis. BDOMEN/PELVIS: Infiltrating left upper pole renal mass, measuring 5.4 x 6.1 cm, max SUV 10.7. 1.9 cm left adrenal metastasis, max SUV 7.5. Suspected 15 mm subcapsular lesion at the junction of segment 4 and  segment 8 (series 4/image 94), max SUV 4.1. This was not evident on recent enhanced CT abdomen/pelvis. No abnormal hypermetabolism in the spleen, pancreas, or right adrenal gland. No hypermetabolic abdominopelvic lymphadenopathy. Incidental CT findings: therosclerotic calcifications the abdominal aorta and branch vessels. Status post cholecystectomy. Residual contrast with streak artifact involving distal colon. ssociated colonic diverticulosis, without evidence of diverticulitis. SKELETON: No focal hypermetabolic activity to suggest skeletal metastasis. Incidental CT findings: Degenerative changes of the visualized thoracolumbar spine. Right hip arthroplasty, without evidence of complication. IMPRESSION: 6.1 cm infiltrating left upper pole renal mass, corresponding to the patient's known left renal neoplasm. ssociated 1.9 cm left adrenal metastasis. Suspected new 15 mm subcapsular hepatic lesion at the junction of segment 4 and 8, worrisome for liver metastasis, although not evident on recent CT abdomen/pelvis. No findings suspicious for metastatic disease in the chest. Electronically Signed   By: Julian Hy M.D.   On: 01/18/2018 15:40   Mm Digital Screening Bilateral  Result Date: 01/08/2018 CLINICL DT:  Screening. EXM: DIGITL SCREENING BILTERL MMMOGRM WITH CD COMPRISON:  Previous exam(s). CR Breast Density Category b: There are scattered areas of fibroglandular density. FINDINGS: There are no findings suspicious for malignancy. Images were processed with CD. IMPRESSION: No mammographic evidence of malignancy.  result letter of this screening mammogram will be mailed directly to the patient. RECOMMENDTION: Screening mammogram in one year. (Code:SM-B-01Y) BI-RDS CTEGORY  1: Negative. Electronically Signed   By: Lajean Manes M.D.   On: 01/08/2018 14:53    Microbiology: No results found for this or any previous visit (from the past 240 hour(s)).   Labs: Basic Metabolic  Panel: Recent Labs  Lab 02/01/18 0111 02/02/18 0927 02/03/18 0511  N 136 135 136  K 4.7 4.2 3.8  CL 101 98 97*  CO2 27 26 28   GLUCOSE 181* 174* 159*  BUN 21 18 12   CRETININE 0.89 0.89 0.88  CLCIUM 8.9 8.9 8.7*   Liver Function Tests: Recent Labs  Lab 02/03/18 0511  ST 17  LT 14  LKPHOS 71  BILITOT 0.6  PROT 7.7  ALBUMIN 3.0*   No results for input(s): LIPASE, AMYLASE in the last 168 hours. No results for input(s): AMMONIA in the last 168 hours. CBC: Recent Labs  Lab 02/01/18 0111 02/01/18 0309 02/02/18 0927 02/02/18 1657 02/03/18 0511  WBC 11.0* 12.2* 15.4*  --  12.4*  NEUTROABS 8.5* 9.5*  --   --   --   HGB 9.2* 9.3* 7.6* 9.5* 9.4*  HCT 30.5* 30.4* 24.7* 30.5* 31.1*  MCV 100.3* 100.3* 103.3*  --  103.3*  PLT 472* 494* 517*  --  540*   Cardiac Enzymes: No results for input(s): CKTOTAL, CKMB, CKMBINDEX, TROPONINI in the last 168 hours. BNP: BNP (last 3 results) Recent Labs    12/28/17 1251  BNP 46.0    ProBNP (last 3 results) No results for input(s): PROBNP in the last 8760 hours.  CBG: Recent Labs  Lab 02/02/18 0935  GLUCAP 159*       Signed:  Fayrene Helper MD.  [facility] 02/03/2018, 8:13 PM

## 2018-02-03 NOTE — Progress Notes (Signed)
Pt is discharged to home. Dc instructions given with husband at bedside. No concerns voiced. Left unit in wheelchair pushed by NT. Left in stable condition. VWilliams,RN.

## 2018-02-03 NOTE — Evaluation (Signed)
Physical Therapy Evaluation Patient Details Name: [name] MRN: [id number] DOB: 03-23-1932 Today's Date: 02/03/2018   History of Present Illness  Pt admitted through ED 2* severe weakness and slightly orthostatic - pt with Hgb of 7.6.  Pt with hx of PVD, macular degenerationi, DM, Fibromyalgia, Colon CA, R THR, TKR, and vertebroplasty.  Pt with recent dx of kidney mass and currently being treated at [facility].  Clinical Impression  Pt admitted as above and presenting with functional mobility limitations 2* generalized weakness and mild ambulatory balance deficits.  Pt currently demonstrates ability to perform all mobility tasks without physical assist and ambulated 350 sans AD with no LOB and good safety awareness - including back stepping, side stepping, sudden stop/start and direction change.  Pt eager for dc home with assist of son.    Follow Up Recommendations No PT follow up    Equipment Recommendations  None recommended by PT    Recommendations for Other Services       Precautions / Restrictions Precautions Precautions: Fall Restrictions Weight Bearing Restrictions: No      Mobility  Bed Mobility               General bed mobility comments: Pt up in chair and requests back to same  Transfers Overall transfer level: Needs assistance Equipment used: None Transfers: Sit to/from Stand Sit to Stand: Supervision;Modified independent (Device/Increase time)         General transfer comment: min cues for transition position; no physical assist needed  Ambulation/Gait Ambulation/Gait assistance: Min guard;Independent Gait Distance (Feet): 350 Feet Assistive device: None Gait Pattern/deviations: Step-through pattern;Decreased step length - right;Decreased step length - left;Shuffle;Wide base of support Gait velocity: decr   General Gait Details: Pt ambulated sans AD and able to step fwd, back and sideways as well as perform sudden start/stop and direction  chanages with no instability or LOB.  Pt demonsrating good safety awareness throughout.  Stairs            Wheelchair Mobility    Modified Rankin (Stroke Patients Only)       Balance Overall balance assessment: Needs assistance Sitting-balance support: No upper extremity supported;Feet supported Sitting balance-Leahy Scale: Good     Standing balance support: No upper extremity supported Standing balance-Leahy Scale: Good                               Pertinent Vitals/Pain Pain Assessment: No/denies pain    Home Living Family/patient expects to be discharged to:: Private residence Living Arrangements: Children Available Help at Discharge: Family Type of Home: Mobile home Home Access: Ramped entrance     Home Layout: One level Home Equipment: Environmental consultant - 2 wheels;Cane - single point;Bedside commode      Prior Function Level of Independence: Independent;Independent with assistive device(s)         Comments: pt used cane outside of home     Hand Dominance        Extremity/Trunk Assessment   Upper Extremity Assessment Upper Extremity Assessment: Generalized weakness    Lower Extremity Assessment Lower Extremity Assessment: Generalized weakness       Communication   Communication: No difficulties  Cognition Arousal/Alertness: Awake/alert Behavior During Therapy: WFL for tasks assessed/performed Overall Cognitive Status: Within Functional Limits for tasks assessed  General Comments      Exercises     Assessment/Plan    PT Assessment Patient needs continued PT services  PT Problem List Decreased strength;Decreased activity tolerance       PT Treatment Interventions DME instruction;Gait training;Stair training;Functional mobility training;Therapeutic activities;Therapeutic exercise;Balance training;Patient/family education    PT Goals (Current goals can be found in the Care  Plan section)  Acute Rehab PT Goals Patient Stated Goal: Home PT Goal Formulation: All assessment and education complete, DC therapy    Frequency Min 3X/week   Barriers to discharge        Co-evaluation               AM-PAC PT "6 Clicks" Mobility  Outcome Measure Help needed turning from your back to your side while in a flat bed without using bedrails?: None Help needed moving from lying on your back to sitting on the side of a flat bed without using bedrails?: None Help needed moving to and from a bed to a chair (including a wheelchair)?: None Help needed standing up from a chair using your arms (e.g., wheelchair or bedside chair)?: None Help needed to walk in hospital room?: None Help needed climbing 3-5 steps with a railing? : A Little 6 Click Score: 23    End of Session Equipment Utilized During Treatment: Gait belt Activity Tolerance: Patient tolerated treatment well Patient left: in chair;with call bell/phone within reach;with family/visitor present Nurse Communication: Mobility status PT Visit Diagnosis: Difficulty in walking, not elsewhere classified (R26.2)    Time: 0630-1601 PT Time Calculation (min) (ACUTE ONLY): 18 min   Charges:   PT Evaluation $PT Eval Low Complexity: 1 Low          Debe Coder PT Acute Rehabilitation Services Pager [phone] Office [phone]   Nalany Steedley 02/03/2018, 1:34 PM

## 2018-02-05 ENCOUNTER — Other Ambulatory Visit: Payer: Self-pay | Admitting: Urology

## 2018-02-14 ENCOUNTER — Encounter: Payer: Self-pay | Admitting: Cardiovascular Disease

## 2018-02-14 ENCOUNTER — Encounter

## 2018-02-14 ENCOUNTER — Ambulatory Visit: Payer: Medicare Other | Admitting: Cardiovascular Disease

## 2018-02-14 VITALS — BP 126/70 | HR 92 | Ht 66.0 in | Wt 126.0 lb

## 2018-02-14 DIAGNOSIS — I10 Essential (primary) hypertension: Secondary | ICD-10-CM | POA: Diagnosis not present

## 2018-02-14 DIAGNOSIS — I2510 Atherosclerotic heart disease of native coronary artery without angina pectoris: Secondary | ICD-10-CM | POA: Diagnosis not present

## 2018-02-14 DIAGNOSIS — C799 Secondary malignant neoplasm of unspecified site: Secondary | ICD-10-CM | POA: Diagnosis not present

## 2018-02-14 DIAGNOSIS — I2584 Coronary atherosclerosis due to calcified coronary lesion: Secondary | ICD-10-CM

## 2018-02-14 DIAGNOSIS — E7800 Pure hypercholesterolemia, unspecified: Secondary | ICD-10-CM

## 2018-02-14 HISTORY — DX: Atherosclerotic heart disease of native coronary artery without angina pectoris: I25.10

## 2018-02-14 NOTE — Patient Instructions (Addendum)
Medication Instructions:  Your physician recommends that you continue on your current medications as directed. Please refer to the Current Medication list given to you today.  If you need a refill on your cardiac medications before your next appointment, please call your pharmacy.   Lab work: NONE  Testing/Procedures: NONE  Follow-Up: At [facility], you and your health needs are our priority.  As part of our continuing mission to provide you with exceptional heart care, we have created designated Provider Care Teams.  These Care Teams include your primary Cardiologist (physician) and Advanced Practice Providers (APPs -  Physician Assistants and Nurse Practitioners) who all work together to provide you with the care you need, when you need it. You will need a follow up appointment in 12 months.  Please call our office 2 months in advance to schedule this appointment.  You may see DR [facility] or one of the following Advanced Practice Providers on your designated Care Team:   [name], PA-C [name], [place] . [name], PA-C  Any Other Special Instructions Will Be Listed Below (If Applicable). YOU  ARE CLEARED FROM A CARDIAC STANDPOINT FOR YOUR UPCOMING PROCEDURE

## 2018-02-14 NOTE — Progress Notes (Signed)
Cardiology Office Note   Date:  02/14/2018   ID:  [name], DOB June 27, 1932, MRN [id number]  PCP:  [name], MD  Cardiologist:   Skeet Latch, MD   No chief complaint on file.    History of Present Illness: [name] is a 85 y.o. female with hypertension, orthostatic hypotension, hyperlipidemia, PVD, colon cancer, and GERD who is being seen today for the evaluation of presyncope and surgical risk assessment at the request of [name], MD.  [name] saw Dr. Karie Kirks for dysphagia and weight loss 12/2017.  She had difficulty swallowing and felt full after eating only small amounts of food.  She also reported exertional dyspnea.  She was referred for an echo 12/2017 that revealed LVEF 55 to 60% with mild LVH and grade 1 diastolic dysfunction.  Chest CT showed a 5.6x5.2 cm L renal mass concerning for renal cell carcinoma and a L adrenal mass concerning for metastasis.  She was noted to have mild COPD/chronic bronchitis, and calcification of the coronaries and aorta.  Subsequent CT of the abdomen also showed an increase in a calcified, mesenteric mass,  She had a PET scan 01/18/18 that was positive in both the renal and adrenal masses.  She reports having seen an oncologist and a surgeon and is scheduled to have surgery on 2/21.  [name] was first diagnosed with colon cancer in 2003 and was treated with hemicolectomy.  [name] was seen in the hospital 01/2018 after an episode of weakness and near syncope.  At the time she was standing.  Prior to that she had come to the emergency department due to epistaxis and had her nose packed.  She was noted to be profoundly orthostatic and her symptoms improved with IV fluids.  At the time she was eating one small meal and wasn't drinking much.  She reports losing 24lb in the last month.  One month ago she was riding her exercise bike three days per week and had no exertional chest pain or dyspnea.  Lately she has been to  weak to do so.  She denies lower extremity edema, orthopnea or PND.  She feels better in the last few days than she has in the last month.  She is drinking meal replacement drinks.  She has no more lightheadedness or dizziness.  Her breathing has improved.  She has experienced edema in her R lower extremity since her hip and knee were replaced.  She has no orthopnea or PND.    [name] saw Dr. Bronson Ing in 2015 for palpitations.  She wore a 30-day event monitor that revealed sinus rhythm and no arrhythmias.  Her symptoms improved with adjustment of her levothyroxine.   Past Medical History:  Diagnosis Date  . Abnormal uterine bleeding (AUB)    on cont HRT  . Anemia    hx of in 2003   . Arthritis   . Blood transfusion without reported diagnosis   . Calcification of aorta (HCC) 11/16/2012  . Choledocholithiasis with obstruction 11/16/2012  . Colon cancer [facility])    s/p resecton of colon in 2003 , hx of chemo  . Colon cancer ([place])   . Compression fracture jan 2012   due to fall  . Coronary artery calcification 02/14/2018  . Depression   . DJD (degenerative joint disease)   . DM II (diabetes mellitus, type II), controlled 11/16/2012  . Fibromyalgia   . GERD (gastroesophageal reflux disease)   . Goiter   .  Headache(784.0)    tension headache daily per pt   . Headaches, cluster   . Heart murmur   . Hiatal hernia   . Hypertension   . Hypothyroid   . Kidney cancer, primary, with metastasis from kidney to other site [facility])   . Macular degeneration   . Osteoarthritis   . Pancreatitis, acute 11/15/2012  . Peripheral vascular disease ([place])    varicose veins in left arm   . PONV (postoperative nausea and vomiting)   . Shortness of breath    with exertion   . TMJ (dislocation of temporomandibular joint)     Past Surgical History:  Procedure Laterality Date  . APPENDECTOMY    . BACK SURGERY  2000   lower  . CHOLECYSTECTOMY N/A 11/17/2012   Procedure: LAPAROSCOPIC CHOLECYSTECTOMY;   Surgeon: Leighton Ruff, MD;  Location: [facility];  Service: General;  Laterality: N/A;  . COLON RESECTION  2003  . COLONOSCOPY W/ BIOPSIES  09/25/2002, 09/18/2001  . DILATION AND CURETTAGE OF UTERUS    . ERCP N/A 11/14/2012   Procedure: ENDOSCOPIC RETROGRADE CHOLANGIOPANCREATOGRAPHY (ERCP);  Surgeon: Jeryl Columbia, MD;  Location: [facility];  Service: Endoscopy;  Laterality: N/A;  . ESOPHAGOGASTRODUODENOSCOPY ENDOSCOPY    . EYE SURGERY     bilateral cataract surgery   . HYSTEROSCOPY    . JOINT REPLACEMENT Right dec 2012   knee  . porta catheter placement  10/30/2001  . porta catheter removal  06/27/2003  . TMJ ARTHROPLASTY  1990's  . TMJ ARTHROPLASTY    . TONSILLECTOMY  age 9 or 28  . TOTAL HIP ARTHROPLASTY Right 05/30/2015   Procedure: TOTAL HIP ARTHROPLASTY ANTERIOR APPROACH;  Surgeon: Rod Can, MD;  Location: [place];  Service: Orthopedics;  Laterality: Right;  . TOTAL KNEE ARTHROPLASTY  12/20/2010   Procedure: TOTAL KNEE ARTHROPLASTY;  Surgeon: Mauri Pole;  Location: [facility];  Service: Orthopedics;  Laterality: Right;  . UPPER GASTROINTESTINAL ENDOSCOPY  09/18/2001  . VERTEBROPLASTY  2012     Current Outpatient Medications  Medication Sig Dispense Refill  . acetaminophen (TYLENOL 8 HOUR ARTHRITIS PAIN) 650 MG CR tablet Take 650 mg by mouth every 8 (eight) hours as needed for pain. Take 2 every 8 hours as needed    . amoxicillin (AMOXIL) 500 MG capsule Take 1 capsule (500 mg total) by mouth 3 (three) times daily. 30 capsule 0  . aspirin EC 81 MG tablet Take 81 mg by mouth daily.    . Calcium-Vitamin D (CALTRATE 600 PLUS-VIT D PO) Take 1 tablet by mouth 2 (two) times daily.     Marland Kitchen docusate sodium (COLACE) 100 MG capsule Take 200 mg by mouth daily.     Marland Kitchen estradiol (ESTRACE) 0.5 MG tablet Take 0.25 mg by mouth daily.   3  . famotidine (PEPCID) 20 MG tablet Take 20 mg by mouth daily.    Marland Kitchen lactobacillus acidophilus (BACID) TABS tablet Take 1 tablet by mouth 2 (two) times daily.    Marland Kitchen  levothyroxine (SYNTHROID, LEVOTHROID) 200 MCG tablet Take 200 mcg by mouth daily before breakfast.    . medroxyPROGESTERone (PROVERA) 5 MG tablet Take 2.5 mg by mouth daily.    . metoprolol succinate (TOPROL-XL) 50 MG 24 hr tablet Take 50 mg by mouth daily.     . Multiple Vitamins-Minerals (CENTRUM SILVER ULTRA WOMENS PO) Take by mouth.    . Multiple Vitamins-Minerals (PRESERVISION AREDS PO) Take 1 capsule by mouth 2 (two) times daily.     . nortriptyline (  PAMELOR) 25 MG capsule Take 75 mg by mouth every morning.     Marland Kitchen omeprazole (PRILOSEC) 20 MG capsule Take 20 mg by mouth daily.    . polyvinyl alcohol (LIQUIFILM TEARS) 1.4 % ophthalmic solution Place 1 drop into both eyes as needed for dry eyes.    . simvastatin (ZOCOR) 20 MG tablet Take 20 mg by mouth daily.    . vitamin B-12 (CYANOCOBALAMIN) 1000 MCG tablet Take 1,000 mcg by mouth daily.    . vitamin C (ASCORBIC ACID) 500 MG tablet Take 500 mg by mouth daily.     . Vitamin D, Ergocalciferol, (DRISDOL) 50000 units CAPS capsule take ONE CAPSULE BY MOUTH every 7 DAYS (Patient taking differently: Take 50,000 Units by mouth as directed. Once a month) 30 capsule 3  . vitamin E 400 UNIT capsule Take 400 Units by mouth 2 (two) times daily.     Marland Kitchen zolpidem (AMBIEN) 10 MG tablet Take 1 tablet (10 mg total) by mouth at bedtime. 30 tablet 0   No current facility-administered medications for this visit.     Allergies:   Tape; Latex; Lidocaine; and Sulfur    Social History:  The patient  reports that she has never smoked. She has never used smokeless tobacco. She reports that she does not drink alcohol or use drugs.   Family History:  The patient's family history includes AAA (abdominal aortic aneurysm) in her brother; Anesthesia problems in her brother; Arrhythmia in her mother; CAD in her brother; Cancer in her son; Diabetes in her mother; Heart attack in her father, mother, and another family member; Heart disease in her brother, brother, father, and  mother; Stroke in her sister.    ROS:  Please see the history of present illness.   Otherwise, review of systems are positive for none.   All other systems are reviewed and negative.    PHYSICAL EXAM: VS:  BP 126/70 Comment: right arm  Pulse 92   Ht 5\' 6"  (1.676 m)   Wt 126 lb (57.2 kg)   LMP 01/17/1994   BMI 20.34 kg/m  , BMI Body mass index is 20.34 kg/m. GENERAL:  Well appearing HEENT:  Pupils equal round and reactive, fundi not visualized, oral mucosa unremarkable NECK:  No jugular venous distention, waveform within normal limits, carotid upstroke brisk and symmetric, no bruits, no thyromegaly LUNGS:  Clear to auscultation bilaterally HEART:  RRR.  PMI not displaced or sustained, S1 and S2 within normal limits, no S3, no S4, no clicks, no rubs, II/VI systolic murmurs ABD:  Flat, positive bowel sounds normal in frequency in pitch, no bruits, no rebound, no guarding, no midline pulsatile mass, no hepatomegaly, no splenomegaly EXT:  2 plus pulses throughout, no edema, no cyanosis no clubbing SKIN:  Pallor noted.  No rashes no nodules NEURO:  Cranial nerves II through XII grossly intact, motor grossly intact throughout PSYCH:  Cognitively intact, oriented to person place and time   EKG:  EKG is ordered today. The ekg ordered today demonstrates sinus rhythm.  Rate 92 bpm.    Echo 12/2017: Study Conclusions  - Left ventricle: The cavity size was normal. Wall thickness was   increased in a pattern of mild LVH. Systolic function was normal.   The estimated ejection fraction was in the range of 55% to 60%.   Wall motion was normal; there were no regional wall motion   abnormalities. Doppler parameters are consistent with abnormal   left ventricular relaxation (grade 1 diastolic dysfunction). -  Aortic valve: Mildly calcified annulus. Trileaflet; mildly   calcified leaflets. - Mitral valve: Mildly calcified annulus. There was trivial   regurgitation. - Right atrium: Central  venous pressure (est): 3 mm Hg. - Atrial septum: No defect or patent foramen ovale was identified. - Tricuspid valve: There was mild regurgitation. - Pulmonary arteries: PA peak pressure: 27 mm Hg (S). - Pericardium, extracardiac: There was no pericardial effusion.  Recent Labs: 12/28/2017: B Natriuretic Peptide 46.0 02/03/2018: ALT 14; BUN 12; Creatinine, Ser 0.88; Hemoglobin 9.4; Platelets 540; Potassium 3.8; Sodium 136    Lipid Panel No results found for: CHOL, TRIG, HDL, CHOLHDL, VLDL, LDLCALC, LDLDIRECT   02/07/2018: WBC 12.9 hemoglobin 9.1, hematocrit 27.8, platelets 646 Iron 17, TIBC 234 87    Wt Readings from Last 3 Encounters:  02/14/18 126 lb (57.2 kg)  02/02/18 127 lb (57.6 kg)  02/01/18 127 lb 3.2 oz (57.7 kg)      ASSESSMENT AND PLAN:  # Hypertension: # Orthostatic hypotension: BP is well-controlled.  She was experiencing orthostatic hypotension in the setting of epistaxis and poor oral intake.  This has resolved with increasing her oral intake.  Continue metoprolol.  # Pre-surgical risk assessment: Ms. Deblasi needs surgery for her likely metastatic disease.  The patient does not have any unstable cardiac conditions.  Upon evaluation today, she can achieve 4 METs or greater without anginal symptoms.  Prior to 3 weeks ago she was able to ride her exercise bike without symptoms.  She is now limited more by weakness than chest pain.  According to [facility] and AHA guidelines, she requires no further cardiac workup prior to her noncardiac surgery and should be at acceptable risk.  her NSQIP risk of peri-procedural MI or cardiac arrest is 2.5%, which is driven by her age and likely metastatic disease.   Our service is available as necessary in the perioperative period.  # Coronary calcification: Asymptomatic.  Continue aspirin, metoprolol and simvastatin.  We will get a copy of her lipids from her PCP.   Current medicines are reviewed at length with the patient today.  The  patient does not have concerns regarding medicines.  The following changes have been made:  no change  Labs/ tests ordered today include:  No orders of the defined types were placed in this encounter.    Disposition:   FU with Jonerik Sliker C. Oval Linsey, MD, [facility] in 1 year     Signed, Aycen Porreca C. Oval Linsey, MD, [facility]  02/14/2018 12:46 PM    [place]

## 2018-02-23 ENCOUNTER — Other Ambulatory Visit (HOSPITAL_COMMUNITY)
Admission: RE | Admit: 2018-02-23 | Discharge: 2018-02-23 | Disposition: A | Discharge status: Home / Self Care | Payer: Medicare Other | Source: Ambulatory Visit | Attending: Family Medicine | Admitting: Family Medicine

## 2018-02-23 DIAGNOSIS — R040 Epistaxis: Secondary | ICD-10-CM | POA: Diagnosis present

## 2018-02-23 LAB — CBC
HCT: 29.6 % — ABNORMAL LOW (ref 36.0–46.0)
Hemoglobin: 8.8 g/dL — ABNORMAL LOW (ref 12.0–15.0)
MCH: 30 pg (ref 26.0–34.0)
MCHC: 29.7 g/dL — ABNORMAL LOW (ref 30.0–36.0)
MCV: 101 fL — ABNORMAL HIGH (ref 80.0–100.0)
Platelets: 571 10*3/uL — ABNORMAL HIGH (ref 150–400)
RBC: 2.93 MIL/uL — ABNORMAL LOW (ref 3.87–5.11)
RDW: 13.6 % (ref 11.5–15.5)
WBC: 11.2 10*3/uL — AB (ref 4.0–10.5)
nRBC: 0 % (ref 0.0–0.2)

## 2018-02-26 ENCOUNTER — Encounter (HOSPITAL_COMMUNITY): Payer: Self-pay

## 2018-02-26 NOTE — Patient Instructions (Signed)
[name]  04/27/1932     Your procedure is scheduled on:  03-09-2018    Report to [facility] Main  Entrance, Report to admitting at  9:15 AM    Call this number if you have problems the morning of surgery [phone]      Remember: Do not eat food or drink liquids :After Midnight.                                     BRUSH YOUR TEETH MORNING OF SURGERY AND RINSE YOUR MOUTH OUT, NO CHEWING GUM CANDY OR MINTS.        Take these medicines the morning of surgery with A SIP OF WATER:  Docusate (colace), Famotidine (pepcid), Levothyroxine (synthroid), Metoprolol (toprol), Nortriptyline (pamelor)                      You may not have any metal on your body including hair pins and               piercings  Do not wear jewelry, make-up, lotions, powders or perfumes, deodorant              Do not wear nail polish.  Do not shave  48 hours prior to surgery.               Do not bring valuables to the hospital. [place].  Contacts, dentures or bridgework may not be worn into surgery.  Leave suitcase in the car. After surgery it may be brought to your room.                Special Instructions:   Drink one bottle 8 oz Magnesium Citrate at noon day before surgery the start clear liquids until midnight night before surgery  _____________________________________________________________________             [facility] - Preparing for Surgery Before surgery, you can play an important role.  Because skin is not sterile, your skin needs to be as free of germs as possible.  You can reduce the number of germs on your skin by washing with CHG (chlorahexidine gluconate) soap before surgery.  CHG is an antiseptic cleaner which kills germs and bonds with the skin to continue killing germs even after washing. Please DO NOT use if you have an allergy to CHG or antibacterial soaps.  If your skin becomes  reddened/irritated stop using the CHG and inform your nurse when you arrive at Short Stay. Do not shave (including legs and underarms) for at least 48 hours prior to the first CHG shower.  You may shave your face/neck. Please follow these instructions carefully:  1.  Shower with CHG Soap the night before surgery and the  morning of Surgery.  2.  If you choose to wash your hair, wash your hair first as usual with your  normal  shampoo.  3.  After you shampoo, rinse your hair and body thoroughly to remove the  shampoo.                            4.  Use CHG as you would any other liquid  soap.  You can apply chg directly  to the skin and wash                       Gently with a scrungie or clean washcloth.  5.  Apply the CHG Soap to your body ONLY FROM THE NECK DOWN.   Do not use on face/ open                           Wound or open sores. Avoid contact with eyes, ears mouth and genitals (private parts).                       Wash face,  Genitals (private parts) with your normal soap.             6.  Wash thoroughly, paying special attention to the area where your surgery  will be performed.  7.  Thoroughly rinse your body with warm water from the neck down.  8.  DO NOT shower/wash with your normal soap after using and rinsing off  the CHG Soap.            9.  Pat yourself dry with a clean towel.            10.  Wear clean pajamas.            11.  Place clean sheets on your bed the night of your first shower and do not  sleep with pets. Day of Surgery : Do not apply any lotions/deodorants the morning of surgery.  Please wear clean clothes to the hospital/surgery center.  FAILURE TO FOLLOW THESE INSTRUCTIONS MAY RESULT IN THE CANCELLATION OF YOUR SURGERY PATIENT SIGNATURE_________________________________  NURSE SIGNATURE__________________________________  ________________________________________________________________________

## 2018-02-27 ENCOUNTER — Emergency Department (HOSPITAL_COMMUNITY)
Admission: EM | Admit: 2018-02-27 | Discharge: 2018-02-27 | Disposition: A | Discharge status: Home / Self Care | Payer: Medicare Other | Attending: Emergency Medicine | Admitting: Emergency Medicine

## 2018-02-27 ENCOUNTER — Emergency Department (HOSPITAL_COMMUNITY): Payer: Medicare Other

## 2018-02-27 ENCOUNTER — Other Ambulatory Visit: Payer: Self-pay

## 2018-02-27 ENCOUNTER — Encounter (HOSPITAL_COMMUNITY): Payer: Self-pay | Admitting: Emergency Medicine

## 2018-02-27 DIAGNOSIS — R319 Hematuria, unspecified: Secondary | ICD-10-CM | POA: Diagnosis not present

## 2018-02-27 DIAGNOSIS — I10 Essential (primary) hypertension: Secondary | ICD-10-CM | POA: Diagnosis not present

## 2018-02-27 DIAGNOSIS — C642 Malignant neoplasm of left kidney, except renal pelvis: Secondary | ICD-10-CM | POA: Diagnosis not present

## 2018-02-27 DIAGNOSIS — E039 Hypothyroidism, unspecified: Secondary | ICD-10-CM | POA: Insufficient documentation

## 2018-02-27 DIAGNOSIS — R300 Dysuria: Secondary | ICD-10-CM | POA: Diagnosis present

## 2018-02-27 LAB — CBC WITH DIFFERENTIAL/PLATELET
Abs Immature Granulocytes: 0.09 10*3/uL — ABNORMAL HIGH (ref 0.00–0.07)
Basophils Absolute: 0 10*3/uL (ref 0.0–0.1)
Basophils Relative: 0 %
EOS PCT: 1 %
Eosinophils Absolute: 0.1 10*3/uL (ref 0.0–0.5)
HCT: 30.4 % — ABNORMAL LOW (ref 36.0–46.0)
Hemoglobin: 8.9 g/dL — ABNORMAL LOW (ref 12.0–15.0)
Immature Granulocytes: 1 %
Lymphocytes Relative: 6 %
Lymphs Abs: 0.9 10*3/uL (ref 0.7–4.0)
MCH: 29.6 pg (ref 26.0–34.0)
MCHC: 29.3 g/dL — ABNORMAL LOW (ref 30.0–36.0)
MCV: 101 fL — ABNORMAL HIGH (ref 80.0–100.0)
Monocytes Absolute: 1.3 10*3/uL — ABNORMAL HIGH (ref 0.1–1.0)
Monocytes Relative: 10 %
Neutro Abs: 11.2 10*3/uL — ABNORMAL HIGH (ref 1.7–7.7)
Neutrophils Relative %: 82 %
Platelets: 629 10*3/uL — ABNORMAL HIGH (ref 150–400)
RBC: 3.01 MIL/uL — ABNORMAL LOW (ref 3.87–5.11)
RDW: 13.6 % (ref 11.5–15.5)
WBC: 13.6 10*3/uL — ABNORMAL HIGH (ref 4.0–10.5)
nRBC: 0 % (ref 0.0–0.2)

## 2018-02-27 LAB — URINALYSIS, ROUTINE W REFLEX MICROSCOPIC
BILIRUBIN URINE: NEGATIVE
Glucose, UA: 50 mg/dL — AB
Ketones, ur: NEGATIVE mg/dL
LEUKOCYTE UA: NEGATIVE
NITRITE: NEGATIVE
Protein, ur: NEGATIVE mg/dL
RBC / HPF: >50 RBC/hpf — ABNORMAL HIGH (ref 0–5)
Specific Gravity, Urine: 1.01 (ref 1.005–1.030)
pH: 6 (ref 5.0–8.0)

## 2018-02-27 LAB — BASIC METABOLIC PANEL
Anion gap: 10 (ref 5–15)
BUN: 22 mg/dL (ref 8–23)
CHLORIDE: 94 mmol/L — AB (ref 98–111)
CO2: 26 mmol/L (ref 22–32)
Calcium: 8.9 mg/dL (ref 8.9–10.3)
Creatinine, Ser: 0.96 mg/dL (ref 0.44–1.00)
GFR calc Af Amer: >60 mL/min (ref >60)
GFR calc non Af Amer: 55 mL/min — ABNORMAL LOW (ref >60)
Glucose, Bld: 228 mg/dL — ABNORMAL HIGH (ref 70–99)
Potassium: 4.3 mmol/L (ref 3.5–5.1)
SODIUM: 130 mmol/L — AB (ref 135–145)

## 2018-02-27 LAB — CBC
HCT: 29 % — ABNORMAL LOW (ref 36.0–46.0)
Hemoglobin: 8.8 g/dL — ABNORMAL LOW (ref 12.0–15.0)
MCH: 29.7 pg (ref 26.0–34.0)
MCHC: 30.3 g/dL (ref 30.0–36.0)
MCV: 98 fL (ref 80.0–100.0)
NRBC: 0 % (ref 0.0–0.2)
Platelets: 534 10*3/uL — ABNORMAL HIGH (ref 150–400)
RBC: 2.96 MIL/uL — ABNORMAL LOW (ref 3.87–5.11)
RDW: 13.4 % (ref 11.5–15.5)
WBC: 14.1 10*3/uL — AB (ref 4.0–10.5)

## 2018-02-27 MED ORDER — SODIUM CHLORIDE 0.9 % IV BOLUS
1000.0000 mL | Freq: Once | INTRAVENOUS | Status: AC
  Administered 2018-02-27: 1000 mL via INTRAVENOUS

## 2018-02-27 MED ORDER — IOHEXOL 300 MG/ML  SOLN
100.0000 mL | Freq: Once | INTRAMUSCULAR | Status: AC | PRN
  Administered 2018-02-27: 100 mL via INTRAVENOUS

## 2018-02-27 MED ORDER — FENTANYL CITRATE (PF) 100 MCG/2ML IJ SOLN
50.0000 ug | Freq: Once | INTRAMUSCULAR | Status: AC
  Administered 2018-02-27: 50 ug via INTRAVENOUS
  Filled 2018-02-27: qty 2

## 2018-02-27 NOTE — ED Notes (Signed)
Patient transported to CT 

## 2018-02-27 NOTE — Discharge Instructions (Addendum)
You were evaluated in the Emergency Department and after careful evaluation, we did not find any emergent condition requiring admission or further testing in the hospital.  Your symptoms today seem to be due to bleeding related to your kidney cancer.  Your blood counts today were reassuring.  Your sodium level was low, please increase your salt intake in your diet.  Keep your appointments on Thursday.  Come back to the emergency department if you cannot urinate or you experience significant lightheadedness or weakness.  Please return to the Emergency Department if you experience any worsening of your condition.  We encourage you to follow up with a primary care provider.  Thank you for allowing [place] to be a part of your care.

## 2018-02-27 NOTE — ED Notes (Signed)
ED Provider at bedside. 

## 2018-02-27 NOTE — ED Provider Notes (Signed)
[facility] Emergency Department Provider Note MRN:  [id number]  Arrival date & time: 02/27/18     Chief Complaint   Dysuria   History of Present Illness   [name] is a 85 y.o. year-old female with a history of renal cell carcinoma presenting to the ED with chief complaint of dysuria.  In the late morning today, patient began experiencing burning with urination, passage of blood clots in the urine.  Patient's chronic left flank pain is worse today.  Denies fever, no chest pain or shortness of breath.  Endorsing nausea but no vomiting.  Pain is moderate, constant, no exacerbating relieving factors.  Review of Systems  A complete 10 system review of systems was obtained and all systems are negative except as noted in the HPI and PMH.   Patient's Health History    Past Medical History:  Diagnosis Date  . Abnormal uterine bleeding (AUB)    on cont HRT  . Anemia   . Calcification of aorta (HCC) 11/16/2012  . Cancer ([place])    kidney  . Compression fracture jan 2012   due to fall  . Coronary artery calcification 02/14/2018   cardiologist-- dr t. Oval Linsey  . Depression   . DJD (degenerative joint disease)   . DM II (diabetes mellitus, type II), controlled 11/16/2012  . Fibromyalgia   . GERD (gastroesophageal reflux disease)   . Goiter   . Headache(784.0)    tension headache daily per pt   . Heart murmur   . Hiatal hernia   . Hypertension   . Hypothyroidism   . Left adrenal mass ([place])   . Left renal mass   . Left thyroid nodule    hx biopsy 2013, benign  . Macular degeneration   . Osteoarthritis   . Osteoporosis   . Peripheral vascular disease ([place])    varicose veins in left arm   . 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 PONV  (postoperative nausea and vomiting)   . Shortness of breath    with exertion   . TMJ (dislocation of temporomandibular joint)     Past Surgical History:  Procedure Laterality Date  . APPENDECTOMY    . BACK SURGERY  2000   lower  . CATARACT EXTRACTION W/ INTRAOCULAR LENS  IMPLANT, BILATERAL    . CHOLECYSTECTOMY N/A 11/17/2012   Procedure: LAPAROSCOPIC CHOLECYSTECTOMY;  Surgeon: Leighton Ruff, MD;  Location: [facility];  Service: General;  Laterality: N/A;  . COLONOSCOPY W/ BIOPSIES  09/25/2002, 09/18/2001  . ERCP N/A 11/14/2012   Procedure: ENDOSCOPIC RETROGRADE CHOLANGIOPANCREATOGRAPHY (ERCP);  Surgeon: Jeryl Columbia, MD;  Location: [facility];  Service: Endoscopy;  Laterality: N/A;  . ESOPHAGOGASTRODUODENOSCOPY ENDOSCOPY    . HYSTEROSCOPY W/D&C    . KYPHOPLASTY    . LAPAROSCOPIC RIGHT HEMI COLECTOMY  10-03-2001    dr Hassell Done @[facility]   . porta catheter placement  10/30/2001   REMOVAL 2005  . TMJ ARTHROPLASTY  1990's  . TMJ ARTHROPLASTY    . TONSILLECTOMY  age 46 or 73  . TOTAL HIP ARTHROPLASTY Right 05/30/2015   Procedure: TOTAL HIP ARTHROPLASTY ANTERIOR APPROACH;  Surgeon: Rod Can, MD;  Location: [place];  Service: Orthopedics;  Laterality: Right;  . TOTAL KNEE ARTHROPLASTY  12/20/2010   Procedure: TOTAL  KNEE ARTHROPLASTY;  Surgeon: Mauri Pole;  Location: [facility];  Service: Orthopedics;  Laterality: Right;  . TOTAL KNEE ARTHROPLASTY Right 12-20-2010  dr Alvan Dame @[facility]   . UPPER GASTROINTESTINAL ENDOSCOPY  09/18/2001    Family History  Problem Relation Age of Onset  . Heart disease Mother        Enlarged heart  . Arrhythmia Mother   . Diabetes Mother   . Heart attack Mother   . Heart disease Brother        MI at unknown age  . Anesthesia problems Brother   . Heart disease Father   . Heart attack Father   . Cancer Son        non-hodgkins lymphoma  . Stroke Sister   . Heart attack Other        MI at age 22  . Heart disease Brother        CABG  . AAA (abdominal aortic aneurysm) Brother     . CAD Brother     Social History   Socioeconomic History  . Marital status: Single    Spouse name: Not on file  . Number of children: 1  . Years of education: Not on file  . Highest education level: Not on file  Occupational History    Comment: retired  Scientific laboratory technician  . Financial resource strain: Not on file  . Food insecurity:    Worry: Not on file    Inability: Not on file  . Transportation needs:    Medical: Not on file    Non-medical: Not on file  Tobacco Use  . Smoking status: Never Smoker  . Smokeless tobacco: Never Used  Substance and Sexual Activity  . Alcohol use: No  . Drug use: No  . Sexual activity: Never    Birth control/protection: Post-menopausal  Lifestyle  . Physical activity:    Days per week: Not on file    Minutes per session: Not on file  . Stress: Not on file  Relationships  . Social connections:    Talks on phone: Not on file    Gets together: Not on file    Attends religious service: Not on file    Active member of club or organization: Not on file    Attends meetings of clubs or organizations: Not on file    Relationship status: Not on file  . Intimate partner violence:    Fear of current or ex partner: Not on file    Emotionally abused: Not on file    Physically abused: Not on file    Forced sexual activity: Not on file  Other Topics Concern  . Not on file  Social History Narrative  . Not on file     Physical Exam  Vital Signs and Nursing Notes reviewed Vitals:   02/27/18 2230 02/27/18 2248  BP: (!) 182/98 (!) 180/99  Pulse: (!) 111 (!) 112  Resp: 18 18  Temp:    SpO2: 97% 97%    CONSTITUTIONAL: Well-appearing, NAD NEURO:  Alert and oriented x 3, no focal deficits EYES:  eyes equal and reactive ENT/NECK:  no LAD, no JVD CARDIO: Regular rate, well-perfused, normal S1 and S2 PULM:  CTAB no wheezing or rhonchi GI/GU:  normal bowel sounds, non-distended, non-tender MSK/SPINE:  No gross deformities, no edema SKIN:  no rash,  atraumatic PSYCH:  Appropriate speech and behavior  Diagnostic and Interventional Summary    Labs Reviewed  URINALYSIS, ROUTINE W REFLEX MICROSCOPIC - Abnormal; Notable for the  following components:      Result Value   APPearance HAZY (*)    Glucose, UA 50 (*)    Hgb urine dipstick LARGE (*)    RBC / HPF >50 (*)    Bacteria, UA RARE (*)    All other components within normal limits  CBC WITH DIFFERENTIAL/PLATELET - Abnormal; Notable for the following components:   WBC 13.6 (*)    RBC 3.01 (*)    Hemoglobin 8.9 (*)    HCT 30.4 (*)    MCV 101.0 (*)    MCHC 29.3 (*)    Platelets 629 (*)    Neutro Abs 11.2 (*)    Monocytes Absolute 1.3 (*)    Abs Immature Granulocytes 0.09 (*)    All other components within normal limits  BASIC METABOLIC PANEL - Abnormal; Notable for the following components:   Sodium 130 (*)    Chloride 94 (*)    Glucose, Bld 228 (*)    GFR calc non Af Amer 55 (*)    All other components within normal limits  CBC - Abnormal; Notable for the following components:   WBC 14.1 (*)    RBC 2.96 (*)    Hemoglobin 8.8 (*)    HCT 29.0 (*)    Platelets 534 (*)    All other components within normal limits  URINE CULTURE    CT ABDOMEN PELVIS W CONTRAST  Final Result      Medications  sodium chloride 0.9 % bolus 1,000 mL (0 mLs Intravenous Stopped 02/27/18 2245)  fentaNYL (SUBLIMAZE) injection 50 mcg (50 mcg Intravenous Given 02/27/18 2047)  iohexol (OMNIPAQUE) 300 MG/ML solution 100 mL (100 mLs Intravenous Contrast Given 02/27/18 2054)     Procedures Critical Care  ED Course and Medical Decision Making  I have reviewed the triage vital signs and the nursing notes.  Pertinent labs & imaging results that were available during my care of the patient were reviewed by me and considered in my medical decision making (see below for details).  Hematuria with clots in this 85 year old female with known renal cell carcinoma, has a scheduled admission in 10 days for  nephrectomy.  Patient continues to pass blood clots with urination here in the emergency department.  Vital signs stable, hemoglobin at baseline.  Will obtain CT imaging to evaluate the tumor for active bleeding, will consider repeat CBC to determine downtrending versus stabilize levels.  Has good follow-up in 2 days with her regular team of doctors.  CT reveals mild worsening of tumor burden, with radiology read commenting on possible movement or invasion into the renal vein.  Given this read, urology was consulted to help gauge concern.  Urology does not feel that patient warrants admission based on the CT findings, hematuria to be expected given her condition, blood counts stable today, appropriate for discharge.  Patient informed of her low sodium, will replete with diet.  Strict return precautions for inability to urinate, sudden weakness or lightheadedness.  Patient is without fever or suprapubic tenderness, nitrite negative, will send urine culture to determine need for antibiotics.  After the discussed management above, the patient was determined to be safe for discharge.  The patient was in agreement with this plan and all questions regarding their care were answered.  ED return precautions were discussed and the patient will return to the ED with any significant worsening of condition.  Barth Kirks. Sedonia Small, [place] [email or web address]  Final Clinical Impressions(s) /  ED Diagnoses     ICD-10-CM   1. Renal cell carcinoma of left kidney (HCC) C64.2   2. Hematuria, unspecified type R31.9     ED Discharge Orders    None         Maudie Flakes, MD 02/27/18 2309

## 2018-02-27 NOTE — ED Notes (Signed)
Pt assisted with bed pan

## 2018-02-27 NOTE — ED Triage Notes (Signed)
Patient complaining of burning with urination and blood in urine starting today.

## 2018-02-28 ENCOUNTER — Emergency Department (HOSPITAL_COMMUNITY)
Admission: EM | Admit: 2018-02-28 | Discharge: 2018-02-28 | Disposition: A | Discharge status: Home / Self Care | Payer: Medicare Other | Attending: Emergency Medicine | Admitting: Emergency Medicine

## 2018-02-28 ENCOUNTER — Encounter (HOSPITAL_COMMUNITY): Payer: Self-pay | Admitting: Emergency Medicine

## 2018-02-28 ENCOUNTER — Ambulatory Visit: Payer: Medicare Other | Admitting: Cardiovascular Disease

## 2018-02-28 ENCOUNTER — Other Ambulatory Visit: Payer: Self-pay

## 2018-02-28 DIAGNOSIS — E119 Type 2 diabetes mellitus without complications: Secondary | ICD-10-CM | POA: Diagnosis not present

## 2018-02-28 DIAGNOSIS — R319 Hematuria, unspecified: Secondary | ICD-10-CM | POA: Insufficient documentation

## 2018-02-28 DIAGNOSIS — R1030 Lower abdominal pain, unspecified: Secondary | ICD-10-CM | POA: Insufficient documentation

## 2018-02-28 DIAGNOSIS — D649 Anemia, unspecified: Secondary | ICD-10-CM | POA: Diagnosis not present

## 2018-02-28 DIAGNOSIS — E039 Hypothyroidism, unspecified: Secondary | ICD-10-CM | POA: Diagnosis not present

## 2018-02-28 DIAGNOSIS — E871 Hypo-osmolality and hyponatremia: Secondary | ICD-10-CM

## 2018-02-28 DIAGNOSIS — Z96651 Presence of right artificial knee joint: Secondary | ICD-10-CM | POA: Insufficient documentation

## 2018-02-28 DIAGNOSIS — I10 Essential (primary) hypertension: Secondary | ICD-10-CM | POA: Insufficient documentation

## 2018-02-28 DIAGNOSIS — N390 Urinary tract infection, site not specified: Secondary | ICD-10-CM

## 2018-02-28 DIAGNOSIS — C652 Malignant neoplasm of left renal pelvis: Secondary | ICD-10-CM | POA: Diagnosis not present

## 2018-02-28 DIAGNOSIS — Z85038 Personal history of other malignant neoplasm of large intestine: Secondary | ICD-10-CM | POA: Insufficient documentation

## 2018-02-28 HISTORY — DX: Urinary tract infection, site not specified: N39.0

## 2018-02-28 LAB — CBC
HEMATOCRIT: 29 % — AB (ref 36.0–46.0)
Hemoglobin: 8.5 g/dL — ABNORMAL LOW (ref 12.0–15.0)
MCH: 29.5 pg (ref 26.0–34.0)
MCHC: 29.3 g/dL — ABNORMAL LOW (ref 30.0–36.0)
MCV: 100.7 fL — ABNORMAL HIGH (ref 80.0–100.0)
Platelets: 581 10*3/uL — ABNORMAL HIGH (ref 150–400)
RBC: 2.88 MIL/uL — ABNORMAL LOW (ref 3.87–5.11)
RDW: 13.6 % (ref 11.5–15.5)
WBC: 14.3 10*3/uL — ABNORMAL HIGH (ref 4.0–10.5)
nRBC: 0 % (ref 0.0–0.2)

## 2018-02-28 LAB — BASIC METABOLIC PANEL
Anion gap: 11 (ref 5–15)
BUN: 20 mg/dL (ref 8–23)
CO2: 23 mmol/L (ref 22–32)
CREATININE: 0.93 mg/dL (ref 0.44–1.00)
Calcium: 8.6 mg/dL — ABNORMAL LOW (ref 8.9–10.3)
Chloride: 93 mmol/L — ABNORMAL LOW (ref 98–111)
GFR calc Af Amer: >60 mL/min (ref >60)
GFR calc non Af Amer: 57 mL/min — ABNORMAL LOW (ref >60)
Glucose, Bld: 300 mg/dL — ABNORMAL HIGH (ref 70–99)
Potassium: 4.2 mmol/L (ref 3.5–5.1)
Sodium: 127 mmol/L — ABNORMAL LOW (ref 135–145)

## 2018-02-28 MED ORDER — SODIUM CHLORIDE 0.9 % IV BOLUS
1000.0000 mL | Freq: Once | INTRAVENOUS | Status: AC
  Administered 2018-02-28: 1000 mL via INTRAVENOUS

## 2018-02-28 NOTE — ED Provider Notes (Signed)
[facility] EMERGENCY DEPARTMENT Provider Note   CSN: [id number] Arrival date & time: 02/28/18  1532     History   Chief Complaint Chief Complaint  Patient presents with  . Abdominal Pain    HPI [name] is a 85 y.o. female.  HPI Patient presents with lower abdominal pain urinary frequency and hematuria.  Seen in the ER yesterday and thought it was related to patient's known left renal cancer.  States continued pain and has some decreased bleeding with some continued pain in her pelvic area.  States she called her urologist who said this was not from her kidney cancer.  States there was blood that is clearly in the urine.  Pain is in the suprapubic pubic area.  States she is had this for the last 4 years and is always been a urinary tract infection.  States she does not know if the urine yesterday showed an infection.  Had CT scan done yesterday also.  No relief of medicines at home.  No fever. Past Medical History:  Diagnosis Date  . Abnormal uterine bleeding (AUB)    on cont HRT  . Anemia   . Calcification of aorta (HCC) 11/16/2012  . Cancer ([place])    kidney  . Compression fracture jan 2012   due to fall  . Coronary artery calcification 02/14/2018   cardiologist-- dr t. Oval Linsey  . Depression   . DJD (degenerative joint disease)   . DM II (diabetes mellitus, type II), controlled 11/16/2012  . Fibromyalgia   . GERD (gastroesophageal reflux disease)   . Goiter   . Headache(784.0)    tension headache daily per pt   . Heart murmur   . Hiatal hernia   . Hypertension   . Hypothyroidism   . Left adrenal mass ([place])   . Left renal mass   . Left thyroid nodule    hx biopsy 2013, benign  . Macular degeneration   . Osteoarthritis   . Osteoporosis   . Peripheral vascular disease ([place])    varicose veins in left arm   . 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 PONV (postoperative nausea and vomiting)   . Shortness of breath    with exertion   . TMJ (dislocation of temporomandibular joint)     Patient Active Problem List   Diagnosis Date Noted  . Coronary artery calcification 02/14/2018  . Near syncope 02/02/2018  . Orthostatic hypotension 02/02/2018  . Anemia 02/02/2018  . Suprapubic discomfort 07/06/2017  . Vitamin D deficiency 08/19/2015  . Closed right hip fracture ([place]) 05/30/2015  . Chest pain syndrome 05/30/2015  . Fall at home 05/30/2015  . Closed right femoral fracture ([place]) 05/30/2015  . Displaced fracture of right femoral neck ([place]) 05/30/2015  . Fall   . Cough 07/02/2013  . Other dyspnea and respiratory abnormality 07/02/2013  . History of colon cancer, stage III 07/02/2013  . Chronic cholecystitis with calculus s/p lap chole 11/17/2012 11/19/2012  . GERD (gastroesophageal reflux disease) 11/16/2012  . Choledocholithiasis with obstruction 11/16/2012  . Calcification of aorta (HCC) 11/16/2012  . Atherosclerosis 11/16/2012  . Hypothyroidism 11/15/2012  . Nonspecific (abnormal) findings on radiological and other examination of gastrointestinal tract 11/08/2012  . S/P right knee replacement 12/22/2010  . Preop cardiovascular  exam 12/08/2010  . Essential hypertension 12/08/2010  . Hyperlipidemia 12/08/2010    Past Surgical History:  Procedure Laterality Date  . APPENDECTOMY    . BACK SURGERY  2000   lower  . CATARACT EXTRACTION W/ INTRAOCULAR LENS  IMPLANT, BILATERAL    . CHOLECYSTECTOMY N/A 11/17/2012   Procedure: LAPAROSCOPIC CHOLECYSTECTOMY;  Surgeon: Leighton Ruff, MD;  Location: [facility];  Service: General;  Laterality: N/A;  . COLONOSCOPY W/ BIOPSIES  09/25/2002, 09/18/2001  . ERCP N/A 11/14/2012   Procedure: ENDOSCOPIC RETROGRADE CHOLANGIOPANCREATOGRAPHY (ERCP);  Surgeon: Jeryl Columbia, MD;  Location: [facility];  Service: Endoscopy;  Laterality: N/A;  . ESOPHAGOGASTRODUODENOSCOPY ENDOSCOPY    . HYSTEROSCOPY W/D&C    . KYPHOPLASTY    . LAPAROSCOPIC RIGHT HEMI COLECTOMY  10-03-2001    dr Hassell Done @[facility]   . porta catheter placement  10/30/2001   REMOVAL 2005  . TMJ ARTHROPLASTY  1990's  . TMJ ARTHROPLASTY    . TONSILLECTOMY  age 58 or 89  . TOTAL HIP ARTHROPLASTY Right 05/30/2015   Procedure: TOTAL HIP ARTHROPLASTY ANTERIOR APPROACH;  Surgeon: Rod Can, MD;  Location: [place];  Service: Orthopedics;  Laterality: Right;  . TOTAL KNEE ARTHROPLASTY  12/20/2010   Procedure: TOTAL KNEE ARTHROPLASTY;  Surgeon: Mauri Pole;  Location: [facility];  Service: Orthopedics;  Laterality: Right;  . TOTAL KNEE ARTHROPLASTY Right 12-20-2010  dr Alvan Dame @[facility]   . UPPER GASTROINTESTINAL ENDOSCOPY  09/18/2001     OB History    Gravida  1   Para  1   Term  1   Preterm  0   AB  0   Living  1     SAB  0   TAB  0   Ectopic  0   Multiple  0   Live Births  1            Home Medications    Prior to Admission medications   Medication Sig Start Date End Date Taking? Authorizing Provider  acetaminophen (TYLENOL 8 HOUR ARTHRITIS PAIN) 650 MG CR tablet Take 1,300 mg by mouth 2 (two) times daily.    Yes [provider]  aspirin EC 81 MG tablet Take 81 mg by mouth daily.   Yes [provider]  Calcium-Vitamin D (CALTRATE 600 PLUS-VIT D PO) Take 1 tablet by mouth 2 (two) times daily.    Yes [provider]  docusate sodium (COLACE) 100 MG capsule Take 300 mg by mouth daily.    Yes [provider]  esomeprazole (NEXIUM) 40 MG capsule Take 40 mg by mouth every evening.    Yes [provider]  estradiol (ESTRACE) 0.5 MG tablet Take 0.25 mg by mouth at bedtime.  05/16/16  Yes [provider]  famotidine (PEPCID) 20 MG tablet Take 20 mg by mouth daily before breakfast.    Yes [provider]  ferrous sulfate 325 (65 FE) MG tablet Take 325 mg by  mouth 2 (two) times daily.   Yes [provider]  levothyroxine (SYNTHROID, LEVOTHROID) 200 MCG tablet Take 200 mcg by mouth daily before breakfast.   Yes [provider]  medroxyPROGESTERone (PROVERA) 5 MG tablet Take 2.5 mg by mouth daily with supper.    Yes [provider]  metoprolol succinate (TOPROL-XL) 50 MG 24 hr tablet Take 50 mg by mouth every morning.    Yes [provider]  Multiple Vitamins-Minerals (CENTRUM SILVER ULTRA WOMENS PO) Take 1 tablet by mouth every evening.  Yes [provider]  Multiple Vitamins-Minerals (PRESERVISION AREDS PO) Take 2 capsules by mouth daily.    Yes [provider]  nortriptyline (PAMELOR) 25 MG capsule Take 75 mg by mouth daily.    Yes [provider]  ondansetron (ZOFRAN) 4 MG tablet Take 4 mg by mouth 3 (three) times daily as needed for nausea or vomiting.    Yes [provider]  Polyethyl Glycol-Propyl Glycol (SYSTANE ULTRA OP) Place 2 drops into both eyes at bedtime.   Yes [provider]  simvastatin (ZOCOR) 20 MG tablet Take 20 mg by mouth every evening.    Yes [provider]  vitamin B-12 (CYANOCOBALAMIN) 1000 MCG tablet Take 1,000 mcg by mouth daily.   Yes [provider]  vitamin C (ASCORBIC ACID) 500 MG tablet Take 500 mg by mouth daily.    Yes [provider]  Vitamin D, Ergocalciferol, (DRISDOL) 50000 units CAPS capsule take ONE CAPSULE BY MOUTH every 7 DAYS Patient taking differently: Take 50,000 Units by mouth every 30 (thirty) days. 1st day of each month 08/19/15  Yes Kem Boroughs, FNP  vitamin E 400 UNIT capsule Take 800 Units by mouth daily.    Yes [provider]  zolpidem (AMBIEN) 10 MG tablet Take 1 tablet (10 mg total) by mouth at bedtime. 06/01/15  Yes Theodis Blaze, MD  amoxicillin (AMOXIL) 500 MG capsule Take 1 capsule (500 mg total) by mouth 3 (three) times daily. Patient not taking: Reported on 02/28/2018 02/01/18    Rolland Porter, MD    Family History Family History  Problem Relation Age of Onset  . Heart disease Mother        Enlarged heart  . Arrhythmia Mother   . Diabetes Mother   . Heart attack Mother   . Heart disease Brother        MI at unknown age  . Anesthesia problems Brother   . Heart disease Father   . Heart attack Father   . Cancer Son        non-hodgkins lymphoma  . Stroke Sister   . Heart attack Other        MI at age 71  . Heart disease Brother        CABG  . AAA (abdominal aortic aneurysm) Brother   . CAD Brother     Social History Social History   Tobacco Use  . Smoking status: Never Smoker  . Smokeless tobacco: Never Used  Substance Use Topics  . Alcohol use: No  . Drug use: No     Allergies   Tape; Latex; Lidocaine; and Sulfur   Review of Systems Review of Systems  Constitutional: Positive for fatigue. Negative for appetite change.  HENT: Positive for nosebleeds. Negative for congestion.   Respiratory: Negative for shortness of breath.   Cardiovascular: Negative for chest pain.  Gastrointestinal: Positive for abdominal pain.  Genitourinary: Positive for hematuria.  Musculoskeletal: Negative for back pain.  Skin: Negative for rash.  Neurological: Negative for tremors.  Hematological: Negative for adenopathy.  Psychiatric/Behavioral: Negative for confusion.     Physical Exam Updated Vital Signs BP (!) 164/97 (BP Location: Left Arm)   Pulse 96   Temp 98 F (36.7 C) (Oral)   Resp 20   Ht 5\' 6"  (1.676 m)   Wt 57.2 kg   LMP 01/17/1994   SpO2 97%   BMI 20.34 kg/m   Physical Exam Constitutional:      Appearance: She is well-developed.  HENT:  Head: Normocephalic.  Cardiovascular:     Rate and Rhythm: Regular rhythm.  Pulmonary:     Breath sounds: Normal breath sounds.  Abdominal:     Comments: Left upper quadrant tenderness and suprapubic to pubic symphysis tenderness.  Skin:    General: Skin is warm.     Capillary Refill:  Capillary refill takes less than 2 seconds.  Neurological:     General: No focal deficit present.     Mental Status: She is alert.      ED Treatments / Results  Labs (all labs ordered are listed, but only abnormal results are displayed) Labs Reviewed  BASIC METABOLIC PANEL - Abnormal; Notable for the following components:      Result Value   Sodium 127 (*)    Chloride 93 (*)    Glucose, Bld 300 (*)    Calcium 8.6 (*)    GFR calc non Af Amer 57 (*)    All other components within normal limits  CBC - Abnormal; Notable for the following components:   WBC 14.3 (*)    RBC 2.88 (*)    Hemoglobin 8.5 (*)    HCT 29.0 (*)    MCV 100.7 (*)    MCHC 29.3 (*)    Platelets 581 (*)    All other components within normal limits    EKG None  Radiology Ct Abdomen Pelvis W Contrast  Result Date: 02/27/2018 CLINICAL DATA:  Burning with urination. Hematuria beginning today. History of left renal mass. EXAM: CT ABDOMEN AND PELVIS WITH CONTRAST TECHNIQUE: Multidetector CT imaging of the abdomen and pelvis was performed using the standard protocol following bolus administration of intravenous contrast. CONTRAST:  152mL OMNIPAQUE IOHEXOL 300 MG/ML  SOLN COMPARISON:  PET CT 01/18/2018.  CT abdomen 01/03/2018. FINDINGS: Lower chest: Mild basilar atelectasis. No evidence of metastatic disease. Coronary artery calcification. Hepatobiliary: Indistinct 1.5 cm region of low-density in the anterolateral aspect of the right lobe corresponding to the area of abnormal PET activity consistent with a metastasis. Additional scattered areas of subtle low density, most convincing in the right lobe image 29 that could represent additional liver metastases. Previous cholecystectomy. Pancreas: Normal Spleen: Normal. 8 mm low-density in the lateral aspect as previously seen. Adrenals/Urinary Tract: Left adrenal metastatic disease appears similar. The right adrenal gland appears normal. Right kidney is normal. Infiltrating  mass in the upper pole of the left kidney appears similar or is slightly enlarged. This is difficult to measure precisely but measures approximately 5.8 x 6 cm. Stranding around the kidney could be a combination of edema and local invasion. The kidney is not excreting contrast. Tumor probably extends into the renal pelvis. I think there is extension into the proximal renal vein. No bladder lesion is seen. Stomach/Bowel: Previous partial colectomy. Extensive sigmoid diverticulosis without evidence of diverticulitis. No sign of bowel obstruction. Vascular/Lymphatic: Calcified mesenteric nodes appears similar to the previous study. Aortic atherosclerosis. No aneurysm. Reproductive: No pelvic mass. Other: No free fluid or air. Musculoskeletal: No acute bone finding. Spinal curvature in degenerative changes. Old augmented fracture at T11. Previous right hip replacement. No evidence of lytic metastatic disease. IMPRESSION: Slight increase in size of an infiltrating mass within the upper pole of the left kidney. Slightly more regional edema/stranding that could be edema or tumor invasion. Tumor invasion into the collecting system, no longer with any excretion evident on the delayed imaging. Tumor may also be extending into the proximal renal vein. Metastatic disease of the left adrenal gland similar. Progressive metastatic  disease to the liver, with a low-density lesion now being visible along the anterolateral right lobe and probable new disease in the more caudal right lobe. Calcified mesenteric nodal mass is unchanged from the recent study. It is larger than was seen in 2014 but this is unlikely to relate to this renal malignancy. Electronically Signed   By: Nelson Chimes M.D.   On: 02/27/2018 21:23    Procedures Procedures (including critical care time)  Medications Ordered in ED Medications  sodium chloride 0.9 % bolus 1,000 mL (0 mLs Intravenous Stopped 02/28/18 1954)     Initial Impression / Assessment and  Plan / ED Course  I have reviewed the triage vital signs and the nursing notes.  Pertinent labs & imaging results that were available during my care of the patient were reviewed by me and considered in my medical decision making (see chart for details).     Patient with lower abdominal pain.  He has had previously.  States is been infection in the past however urine yesterday does not show infection.  Culture was sent.  CT scan done was overall reassuring.  Due to have left kidney taken out.  However has a mildly worsening hyponatremia with sodium of 127 now.  States she had some Gatorade at home.  Fluid bolus given here.  Also mildly worsened anemia.  Has follow-up tomorrow with preop for her nephrectomy.  Hopefully culture will be back soon.  Also has follow-up with PCP in 2 days.  If becomes lightheaded or dizzy may return sooner.  Final Clinical Impressions(s) / ED Diagnoses   Final diagnoses:  Lower abdominal pain  Hematuria, unspecified type  Anemia, unspecified type  Hyponatremia    ED Discharge Orders    None       Davonna Belling, MD 02/28/18 2054

## 2018-02-28 NOTE — ED Triage Notes (Signed)
PT c/o continued lower abdominal discomfort, increased urinary frequency with blood present and generalized weakness. PT states was seen in the ED yesterday with same complaints and was told to f/u with her surgeon that is going to remove the left kindey and adrenal gland on 03/09/2018 but the surgeon's office stated that she needed to see the ED again bc symptoms weren't common for her kidney cancer.

## 2018-02-28 NOTE — Discharge Instructions (Addendum)
Follow-up with your preop testing tomorrow.  Your urine culture should hopefully come back tomorrow.  Return or follow-up sooner if you develop lightheadedness or dizziness.

## 2018-02-28 NOTE — ED Notes (Signed)
Pt placed on purewick 

## 2018-03-01 ENCOUNTER — Other Ambulatory Visit: Payer: Self-pay

## 2018-03-01 ENCOUNTER — Encounter (HOSPITAL_COMMUNITY)
Admission: RE | Admit: 2018-03-01 | Discharge: 2018-03-01 | Disposition: A | Discharge status: Home / Self Care | Payer: Medicare Other | Source: Ambulatory Visit | Attending: Urology | Admitting: Urology

## 2018-03-01 ENCOUNTER — Encounter (HOSPITAL_COMMUNITY): Payer: Self-pay

## 2018-03-01 DIAGNOSIS — Z01812 Encounter for preprocedural laboratory examination: Secondary | ICD-10-CM | POA: Insufficient documentation

## 2018-03-01 HISTORY — DX: Personal history of (healed) traumatic fracture: Z87.81

## 2018-03-01 HISTORY — DX: Presence of spectacles and contact lenses: Z97.3

## 2018-03-01 HISTORY — DX: Malignant neoplasm of left kidney, except renal pelvis: C64.2

## 2018-03-01 HISTORY — DX: Age-related osteoporosis without current pathological fracture: M81.0

## 2018-03-01 HISTORY — DX: Family history of other specified conditions: Z84.89

## 2018-03-01 HISTORY — DX: Nontoxic single thyroid nodule: E04.1

## 2018-03-01 HISTORY — DX: Arthralgia of temporomandibular joint, unspecified side: M26.629

## 2018-03-01 HISTORY — DX: Personal history of other malignant neoplasm of large intestine: Z85.038

## 2018-03-01 HISTORY — DX: Type 2 diabetes mellitus without complications: E11.9

## 2018-03-01 HISTORY — DX: Dysuria: R30.0

## 2018-03-01 HISTORY — DX: Other specified disorders of adrenal gland: E27.8

## 2018-03-01 HISTORY — DX: Hypothyroidism, unspecified: E03.9

## 2018-03-01 HISTORY — DX: Unspecified abdominal pain: R10.9

## 2018-03-01 HISTORY — DX: Other specified disorders of kidney and ureter: N28.89

## 2018-03-01 HISTORY — DX: Gross hematuria: R31.0

## 2018-03-01 HISTORY — DX: Epistaxis: R04.0

## 2018-03-01 MED ORDER — MAGNESIUM CITRATE PO SOLN: 1.0000 | Freq: Once | ORAL | Status: DC

## 2018-03-01 NOTE — Progress Notes (Signed)
EKG dated 02-14-2018 in epic.  ECHO dated 12-28-2017 in epic.  Cardiac clearence, dr Oval Linsey, dated 02-14-2018 in epic.

## 2018-03-02 LAB — HEMOGLOBIN A1C
Hgb A1c MFr Bld: 7.5 % — ABNORMAL HIGH (ref 4.8–5.6)
Mean Plasma Glucose: 169 mg/dL

## 2018-03-03 LAB — URINE CULTURE: Culture: 70000 — AB

## 2018-03-04 ENCOUNTER — Emergency Department (HOSPITAL_COMMUNITY)
Admission: EM | Admit: 2018-03-04 | Discharge: 2018-03-04 | Disposition: A | Discharge status: Home / Self Care | Payer: Medicare Other | Attending: Emergency Medicine | Admitting: Emergency Medicine

## 2018-03-04 ENCOUNTER — Telehealth: Payer: Self-pay | Admitting: Emergency Medicine

## 2018-03-04 ENCOUNTER — Encounter (HOSPITAL_COMMUNITY): Payer: Self-pay | Admitting: Nurse Practitioner

## 2018-03-04 DIAGNOSIS — R319 Hematuria, unspecified: Secondary | ICD-10-CM

## 2018-03-04 DIAGNOSIS — Z79899 Other long term (current) drug therapy: Secondary | ICD-10-CM | POA: Insufficient documentation

## 2018-03-04 DIAGNOSIS — Z96651 Presence of right artificial knee joint: Secondary | ICD-10-CM | POA: Insufficient documentation

## 2018-03-04 DIAGNOSIS — I10 Essential (primary) hypertension: Secondary | ICD-10-CM | POA: Diagnosis not present

## 2018-03-04 DIAGNOSIS — Z7982 Long term (current) use of aspirin: Secondary | ICD-10-CM | POA: Diagnosis not present

## 2018-03-04 DIAGNOSIS — Z91040 Latex allergy status: Secondary | ICD-10-CM | POA: Diagnosis not present

## 2018-03-04 DIAGNOSIS — E039 Hypothyroidism, unspecified: Secondary | ICD-10-CM | POA: Diagnosis not present

## 2018-03-04 DIAGNOSIS — E119 Type 2 diabetes mellitus without complications: Secondary | ICD-10-CM | POA: Insufficient documentation

## 2018-03-04 LAB — CBC WITH DIFFERENTIAL/PLATELET
Abs Immature Granulocytes: 0.1 10*3/uL — ABNORMAL HIGH (ref 0.00–0.07)
Basophils Absolute: 0 10*3/uL (ref 0.0–0.1)
Basophils Relative: 0 %
Eosinophils Absolute: 0.2 10*3/uL (ref 0.0–0.5)
Eosinophils Relative: 2 %
HCT: 29.4 % — ABNORMAL LOW (ref 36.0–46.0)
Hemoglobin: 9 g/dL — ABNORMAL LOW (ref 12.0–15.0)
Immature Granulocytes: 1 %
Lymphocytes Relative: 8 %
Lymphs Abs: 1 10*3/uL (ref 0.7–4.0)
MCH: 30.9 pg (ref 26.0–34.0)
MCHC: 30.6 g/dL (ref 30.0–36.0)
MCV: 101 fL — ABNORMAL HIGH (ref 80.0–100.0)
Monocytes Absolute: 1.3 10*3/uL — ABNORMAL HIGH (ref 0.1–1.0)
Monocytes Relative: 11 %
Neutro Abs: 9.5 10*3/uL — ABNORMAL HIGH (ref 1.7–7.7)
Neutrophils Relative %: 78 %
Platelets: 563 10*3/uL — ABNORMAL HIGH (ref 150–400)
RBC: 2.91 MIL/uL — ABNORMAL LOW (ref 3.87–5.11)
RDW: 13.3 % (ref 11.5–15.5)
WBC: 12.1 10*3/uL — ABNORMAL HIGH (ref 4.0–10.5)
nRBC: 0 % (ref 0.0–0.2)

## 2018-03-04 LAB — BASIC METABOLIC PANEL
Anion gap: 10 (ref 5–15)
BUN: 21 mg/dL (ref 8–23)
CO2: 28 mmol/L (ref 22–32)
Calcium: 8.9 mg/dL (ref 8.9–10.3)
Chloride: 93 mmol/L — ABNORMAL LOW (ref 98–111)
Creatinine, Ser: 0.92 mg/dL (ref 0.44–1.00)
GFR calc Af Amer: >60 mL/min (ref >60)
GFR calc non Af Amer: 58 mL/min — ABNORMAL LOW (ref >60)
Glucose, Bld: 172 mg/dL — ABNORMAL HIGH (ref 70–99)
Potassium: 4.5 mmol/L (ref 3.5–5.1)
Sodium: 131 mmol/L — ABNORMAL LOW (ref 135–145)

## 2018-03-04 MED ORDER — HYDROCODONE-ACETAMINOPHEN 5-325 MG PO TABS: 2.0000 | ORAL_TABLET | Freq: Two times a day (BID) | ORAL | 0 refills | Status: DC | PRN

## 2018-03-04 MED ORDER — SODIUM CHLORIDE 0.9 % IV SOLN
1.0000 g | Freq: Once | INTRAVENOUS | Status: AC
  Administered 2018-03-04: 1 g via INTRAVENOUS
  Filled 2018-03-04: qty 10

## 2018-03-04 MED ORDER — HYDROMORPHONE HCL 1 MG/ML IJ SOLN
0.5000 mg | Freq: Once | INTRAMUSCULAR | Status: AC
  Administered 2018-03-04: 0.5 mg via INTRAVENOUS
  Filled 2018-03-04: qty 1

## 2018-03-04 NOTE — Progress Notes (Signed)
ED Antimicrobial Stewardship Positive Culture Follow Up   [name] is an 85 y.o. female who presented to [facility] on 02/27/2018 with a chief complaint of  Chief Complaint  Patient presents with  . Dysuria    Recent Results (from the past 720 hour(s))  Urine culture     Status: Abnormal   Collection Time: 02/27/18  6:43 PM  Result Value Ref Range Status   Specimen Description   Final    URINE, RANDOM Performed at [facility], [address], [place], [place] [address]    Special Requests   Final    NONE Performed at [facility], [address], [place], [place] [address]    Culture (A)  Final    70,000 COLONIES/mL ESCHERICHIA COLI 20,000 COLONIES/mL KLEBSIELLA PNEUMONIAE    Report Status 03/03/2018 FINAL  Final   Organism ID, Bacteria ESCHERICHIA COLI (A)  Final   Organism ID, Bacteria KLEBSIELLA PNEUMONIAE (A)  Final      Susceptibility   Escherichia coli - MIC*    AMPICILLIN <=2 SENSITIVE Sensitive     CEFAZOLIN <=4 SENSITIVE Sensitive     CEFTRIAXONE <=1 SENSITIVE Sensitive     CIPROFLOXACIN <=0.25 SENSITIVE Sensitive     GENTAMICIN <=1 SENSITIVE Sensitive     IMIPENEM <=0.25 SENSITIVE Sensitive     NITROFURANTOIN <=16 SENSITIVE Sensitive     TRIMETH/SULFA <=20 SENSITIVE Sensitive     AMPICILLIN/SULBACTAM <=2 SENSITIVE Sensitive     PIP/TAZO <=4 SENSITIVE Sensitive     Extended ESBL NEGATIVE Sensitive     * 70,000 COLONIES/mL ESCHERICHIA COLI   Klebsiella pneumoniae - MIC*    AMPICILLIN RESISTANT Resistant     CEFAZOLIN <=4 SENSITIVE Sensitive     CEFTRIAXONE <=1 SENSITIVE Sensitive     CIPROFLOXACIN <=0.25 SENSITIVE Sensitive     GENTAMICIN <=1 SENSITIVE Sensitive     IMIPENEM <=0.25 SENSITIVE Sensitive     NITROFURANTOIN 32 SENSITIVE Sensitive     TRIMETH/SULFA <=20 SENSITIVE Sensitive     AMPICILLIN/SULBACTAM 4 SENSITIVE Sensitive     PIP/TAZO <=4 SENSITIVE Sensitive     Extended ESBL NEGATIVE Sensitive     * 20,000 COLONIES/mL KLEBSIELLA  PNEUMONIAE    [x]  Patient discharged originally without antimicrobial agent and treatment is now indicated  New antibiotic prescription: Keflex 500mg  po BID for 10 days  ED Provider: Suella Broad, PA-C   Brain Hilts 03/04/2018, 10:50 AM Clinical Pharmacist Monday - Friday phone -  [phone] Saturday - Sunday phone - [phone]

## 2018-03-04 NOTE — ED Triage Notes (Signed)
Pt is c/o ongoing bladder and kidney problems. Reports that she has been statrted on 2 antibiotics for bladder infection and has kidney cancer that she just completed pre-op for removal surgery scheduled for this coming Friday.

## 2018-03-04 NOTE — ED Notes (Signed)
UA and culture sent down to lab if needed.

## 2018-03-04 NOTE — Discharge Instructions (Signed)
Your hemoglobin today was 9.0 which is up from 8.5 four days ago. Your sodium level is 131 which is up from 127. You were given a dose of IV antibiotics in the ER. Make sure you take the keflex (cephalexin) which you were just prescribed. You are being prescribed a pain medication if you feel like you need something stronger than the tylenol you have been taking.

## 2018-03-04 NOTE — Telephone Encounter (Signed)
Post ED Visit - Positive Culture Follow-up: Successful Patient Follow-Up  Culture assessed and recommendations reviewed by:  []  Elenor Quinones, Pharm.D. []  Heide Guile, Pharm.D., BCPS AQ-ID []  Parks Neptune, Pharm.D., BCPS []  Alycia Rossetti, Pharm.D., BCPS []  [place], [place].D., BCPS, AAHIVP []  Legrand Como, Pharm.D., BCPS, AAHIVP []  Salome Arnt, PharmD, BCPS []  Johnnette Gourd, PharmD, BCPS []  Hughes Better, PharmD, BCPS []  Leeroy Cha, PharmD Sloan Leiter PharmD  Positive urine culture  []  Patient discharged without antimicrobial prescription and treatment is now indicated []  Organism is resistant to prescribed ED discharge antimicrobial []  Patient with positive blood cultures  Changes discussed with ED provider: Suella Broad PA New antibiotic prescription start Keflex 500mg  po bid x 10 days Called to [facility] patient/son   Hazle Nordmann 03/04/2018, 2:49 PM

## 2018-03-05 ENCOUNTER — Ambulatory Visit (INDEPENDENT_AMBULATORY_CARE_PROVIDER_SITE_OTHER): Payer: Medicare Other | Admitting: Otolaryngology

## 2018-03-05 DIAGNOSIS — R040 Epistaxis: Secondary | ICD-10-CM

## 2018-03-05 NOTE — Progress Notes (Signed)
Anesthesia Chart Review   Case:  [id number] Date/Time:  03/09/18 1045   Procedure:  XI ROBOTIC ASSISTED LAPAROSCOPIC NEPHRECTOMY (Left ) - 3 HRS   Anesthesia type:  General   Pre-op diagnosis:  LEFT RENAL MASS   Location:  [facility] ROOM 03 / [facility]   Surgeon:  Cleon Gustin, MD      DISCUSSION: 85 yo never smoker with h/o PONV, fibromyalgia, DM II, colon cancer stage III (s/p right hemicolectomy, chemo 2003), hypothyroidism, PVD, anemia, depression, GERD, HTN, left renal mass scheduled for above procedure 03/09/18 with Dr. Nicolette Bang.   Pt last seen by cardiologist, Dr. Skeet Latch, on 02/14/2018.  Per her note, "Ms. Derrig needs surgery for her likely metastatic disease.  The patient does not have any unstable cardiac conditions.  Upon evaluation today, she can achieve 4 METs or greater without anginal symptoms.  Prior to 3 weeks ago she was able to ride her exercise bike without symptoms.  She is now limited more by weakness than chest pain.  According to [facility] and AHA guidelines, she requires no further cardiac workup prior to her noncardiac surgery and should be at acceptable risk.  her NSQIP risk of peri-procedural MI or cardiac arrest is 2.5%, which is driven by her age and likely metastatic disease.   Our service is available as necessary in the perioperative period."  Pt can proceed with planned procedure barring acute status change.   VS: BP 140/78   Pulse 95   Temp (!) 36.4 C (Oral)   Resp 16   Ht 5\' 6"  (1.676 m)   Wt 54.9 kg   LMP 01/17/1994   SpO2 98%   BMI 19.53 kg/m   PROVIDERS: [name], MD is PCP   Skeet Latch, MD is Cardiologist  LABS: Labs reviewed: Acceptable for surgery. (all labs ordered are listed, but only abnormal results are displayed)  Labs Reviewed  HEMOGLOBIN A1C - Abnormal; Notable for the following components:      Result Value   Hgb A1c MFr Bld 7.5 (*)    All other components within normal limits  TYPE AND SCREEN      IMAGES: CT Abdomen Pelvis 02/27/2018 IMPRESSION: Slight increase in size of an infiltrating mass within the upper pole of the left kidney. Slightly more regional edema/stranding that could be edema or tumor invasion. Tumor invasion into the collecting system, no longer with any excretion evident on the delayed imaging. Tumor may also be extending into the proximal renal vein. Metastatic disease of the left adrenal gland similar. Progressive metastatic disease to the liver, with a low-density lesion now being visible along the anterolateral right lobe and probable new disease in the more caudal right lobe. Calcified mesenteric nodal mass is unchanged from the recent study. It is larger than was seen in 2014 but this is unlikely to relate to this renal malignancy.  EKG: 02/14/2018 Rate 92 bpm Normal sinus rhythm  Normal ECG  CV: Echo 12/28/17 Study Conclusions  - Left ventricle: The cavity size was normal. Wall thickness was   increased in a pattern of mild LVH. Systolic function was normal.   The estimated ejection fraction was in the range of 55% to 60%.   Wall motion was normal; there were no regional wall motion   abnormalities. Doppler parameters are consistent with abnormal   left ventricular relaxation (grade 1 diastolic dysfunction). - Aortic valve: Mildly calcified annulus. Trileaflet; mildly   calcified leaflets. - Mitral valve: Mildly calcified annulus. There was  trivial   regurgitation. - Right atrium: Central venous pressure (est): 3 mm Hg. - Atrial septum: No defect or patent foramen ovale was identified. - Tricuspid valve: There was mild regurgitation. - Pulmonary arteries: PA peak pressure: 27 mm Hg (S). - Pericardium, extracardiac: There was no pericardial effusion. Past Medical History:  Diagnosis Date  . Abdominal pain   . Anemia   . Calcification of aorta (HCC) 11/16/2012  . Coronary artery calcification 02/14/2018   cardiologist-- dr t.  Oval Linsey  . Depression   . Diabetes mellitus type 2, diet-controlled ([place])    pt denies  . DJD (degenerative joint disease)   . DM II (diabetes mellitus, type II), controlled 11/16/2012  . Dysuria   . Epistaxis   . Family history of adverse reaction to anesthesia    sister--- ponv  . Fibromyalgia   . GERD (gastroesophageal reflux disease)   . Goiter   . Gross hematuria   . Headache(784.0)    tension headache daily per pt   . Heart murmur   . Hiatal hernia   . History of vertebral compression fracture   . Hypertension   . Hypothyroidism    endocrinologist-- dr Chalmers Cater  . Left adrenal mass ([place])   . Left renal mass   . Left thyroid nodule    hx biopsy 2013, benign  . Macular degeneration    both  left > right  . Osteoarthritis   . Osteoporosis   . Peripheral vascular disease ([place])    varicose veins in left arm   . 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 PONV (postoperative nausea and vomiting)   . Renal cell cancer, left [facility])    oncologist-- dr Alen Blew  . TMJ syndrome    right   . Wears glasses     Past Surgical History:  Procedure Laterality Date  . APPENDECTOMY  age 64  . CATARACT EXTRACTION W/ INTRAOCULAR LENS  IMPLANT, BILATERAL  2004  approx.  . CHOLECYSTECTOMY N/A 11/17/2012   Procedure: LAPAROSCOPIC CHOLECYSTECTOMY;  Surgeon: Leighton Ruff, MD;  Location: [facility];  Service: General;  Laterality: N/A;  . COLONOSCOPY W/ BIOPSIES  09/25/2002, 09/18/2001  . ERCP N/A 11/14/2012   Procedure: ENDOSCOPIC RETROGRADE CHOLANGIOPANCREATOGRAPHY (ERCP);  Surgeon: Jeryl Columbia, MD;  Location: [facility];  Service: Endoscopy;  Laterality: N/A;  . ESOPHAGOGASTRODUODENOSCOPY ENDOSCOPY    . KYPHOPLASTY  01/2011  . LAPAROSCOPIC RIGHT HEMI COLECTOMY  10-03-2001    dr Hassell Done  @[facility]   . [place]  2000  . porta catheter placement     REMOVAL 2005  . TEMPOROMANDIBULAR JOINT SURGERY Right 1990s  . TONSILLECTOMY  child  . TOTAL HIP ARTHROPLASTY Right 05/30/2015   Procedure: TOTAL HIP ARTHROPLASTY ANTERIOR APPROACH;  Surgeon: Rod Can, MD;  Location: [facility];  Service: Orthopedics;  Laterality: Right;  . TOTAL KNEE ARTHROPLASTY  12/20/2010   Procedure: TOTAL KNEE ARTHROPLASTY;  Surgeon: Mauri Pole;  Location: [facility];  Service: Orthopedics;  Laterality: Right;  . TOTAL KNEE ARTHROPLASTY Right 12-20-2010  dr Alvan Dame @[facility]   . UPPER GASTROINTESTINAL ENDOSCOPY  09/18/2001    MEDICATIONS: . cephALEXin (KEFLEX) 500 MG capsule  . HYDROcodone-acetaminophen (NORCO/VICODIN) 5-325 MG tablet  . acetaminophen (TYLENOL 8 HOUR ARTHRITIS PAIN) 650 MG CR tablet  . amoxicillin (  AMOXIL) 500 MG capsule  . amoxicillin (AMOXIL) 500 MG tablet  . aspirin EC 81 MG tablet  . Calcium-Vitamin D (CALTRATE 600 PLUS-VIT D PO)  . cephALEXin (KEFLEX) 500 MG capsule  . docusate sodium (COLACE) 100 MG capsule  . esomeprazole (NEXIUM) 40 MG capsule  . estradiol (ESTRACE) 0.5 MG tablet  . famotidine (PEPCID) 20 MG tablet  . ferrous sulfate 325 (65 FE) MG tablet  . HYDROcodone-acetaminophen (NORCO/VICODIN) 5-325 MG tablet  . levothyroxine (SYNTHROID, LEVOTHROID) 200 MCG tablet  . medroxyPROGESTERone (PROVERA) 5 MG tablet  . metoprolol succinate (TOPROL-XL) 50 MG 24 hr tablet  . Multiple Vitamins-Minerals (CENTRUM SILVER ULTRA WOMENS PO)  . Multiple Vitamins-Minerals (PRESERVISION AREDS PO)  . nortriptyline (PAMELOR) 25 MG capsule  . OVER THE COUNTER MEDICATION  . Polyethyl Glycol-Propyl Glycol (SYSTANE ULTRA OP)  . simvastatin (ZOCOR) 20 MG tablet  . vitamin B-12 (CYANOCOBALAMIN) 1000 MCG tablet  . vitamin C (ASCORBIC ACID) 500 MG tablet  . Vitamin D, Ergocalciferol, (DRISDOL) 50000 units CAPS capsule  . vitamin E 400 UNIT capsule  . zolpidem (AMBIEN) 10 MG tablet   No current  facility-administered medications for this encounter.      Maia Plan [facility] Pre-Surgical Testing [phone] 03/05/18 4:27 PM

## 2018-03-05 NOTE — Anesthesia Preprocedure Evaluation (Addendum)
Anesthesia Evaluation  Patient identified by MRN, date of birth, ID band Patient awake    Reviewed: Allergy & Precautions, NPO status , Patient's Chart, lab work & pertinent test results, reviewed documented beta blocker date and time   History of Anesthesia Complications (+) PONV and history of anesthetic complications  Airway Mallampati: II  TM Distance: >3 FB Neck ROM: Full    Dental  (+) Teeth Intact, Dental Advisory Given   Pulmonary COPD,    Pulmonary exam normal breath sounds clear to auscultation       Cardiovascular hypertension, Pt. on home beta blockers (-) angina+ CAD and + Peripheral Vascular Disease  (-) Past MI Normal cardiovascular exam Rhythm:Regular Rate:Normal     Neuro/Psych  Headaches, PSYCHIATRIC DISORDERS Depression    GI/Hepatic Neg liver ROS, hiatal hernia, GERD  Medicated,Colon cancer s/p hemicolectomy    Endo/Other  diabetes, Well Controlled, Type 2Hypothyroidism   Renal/GU Renal diseaseLEFT RENAL MASS     Musculoskeletal  (+) Arthritis , Fibromyalgia -  Abdominal   Peds  Hematology  (+) Blood dyscrasia, anemia ,   Anesthesia Other Findings Day of surgery medications reviewed with the patient.  Reproductive/Obstetrics                          Anesthesia Physical Anesthesia Plan  ASA: IV  Anesthesia Plan: General   Post-op Pain Management:    Induction: Intravenous  PONV Risk Score and Plan: 4 or greater and Dexamethasone, Ondansetron, Treatment may vary due to age or medical condition and Propofol infusion  Airway Management Planned: Oral ETT and Video Laryngoscope Planned  Additional Equipment: Arterial line  Intra-op Plan:   Post-operative Plan: Extubation in OR  Informed Consent: I have reviewed the patients History and Physical, chart, labs and discussed the procedure including the risks, benefits and alternatives for the proposed anesthesia with  the patient or authorized representative who has indicated his/her understanding and acceptance.     Dental advisory given  Plan Discussed with: CRNA  Anesthesia Plan Comments: (2nd large bore IV after induction)     Anesthesia Quick Evaluation

## 2018-03-05 NOTE — ED Provider Notes (Signed)
[facility] DEPT Provider Note   CSN: [id number] Arrival date & time: 03/04/18  1704    History   Chief Complaint Chief Complaint  Patient presents with  . Hematuria    HPI [name] is a 85 y.o. female.     HPI   85 year old female with hematuria and some lower abdominal pain.  She is concerned that she may be more anemic.  Known renal mass with recent hematuria.  Recently diagnosed with UTI.  Cultures came back and she was just changed to cephalexin based on sensitivities.  She has had some mild lower abdominal pain.  She is scheduled for urologic surgery later this week.  She denies any dizziness or lightheadedness.  No acute dyspnea.  Past Medical History:  Diagnosis Date  . Abdominal pain   . Anemia   . Calcification of aorta (HCC) 11/16/2012  . Coronary artery calcification 02/14/2018   cardiologist-- dr t. Oval Linsey  . Depression   . Diabetes mellitus type 2, diet-controlled ([place])    pt denies  . DJD (degenerative joint disease)   . DM II (diabetes mellitus, type II), controlled 11/16/2012  . Dysuria   . Epistaxis   . Family history of adverse reaction to anesthesia    sister--- ponv  . Fibromyalgia   . GERD (gastroesophageal reflux disease)   . Goiter   . Gross hematuria   . Headache(784.0)    tension headache daily per pt   . Heart murmur   . Hiatal hernia   . History of vertebral compression fracture   . Hypertension   . Hypothyroidism    endocrinologist-- dr Chalmers Cater  . Left adrenal mass ([place])   . Left renal mass   . Left thyroid nodule    hx biopsy 2013, benign  . Macular degeneration    both  left > right  . Osteoarthritis   . Osteoporosis   . Peripheral vascular disease ([place])    varicose veins in left arm   . 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 PONV (postoperative nausea and vomiting)   . Renal cell cancer, left [facility])    oncologist-- dr Alen Blew  . TMJ syndrome    right   . Wears glasses     Patient Active Problem List   Diagnosis Date Noted  . Coronary artery calcification 02/14/2018  . Near syncope 02/02/2018  . Orthostatic hypotension 02/02/2018  . Anemia 02/02/2018  . Suprapubic discomfort 07/06/2017  . Vitamin D deficiency 08/19/2015  . Closed right hip fracture ([place]) 05/30/2015  . Chest pain syndrome 05/30/2015  . Fall at home 05/30/2015  . Closed right femoral fracture ([place]) 05/30/2015  . Displaced fracture of right femoral neck ([place]) 05/30/2015  . Fall   . Cough 07/02/2013  . Other dyspnea and respiratory abnormality 07/02/2013  . History of colon cancer, stage III 07/02/2013  . Chronic cholecystitis with calculus s/p lap chole 11/17/2012 11/19/2012  . GERD (gastroesophageal reflux disease) 11/16/2012  . Choledocholithiasis with obstruction 11/16/2012  . Calcification of aorta (HCC) 11/16/2012  . Atherosclerosis 11/16/2012  . Hypothyroidism 11/15/2012  . Nonspecific (abnormal) findings on radiological and other examination of gastrointestinal tract 11/08/2012  . S/P right knee replacement 12/22/2010  . Preop cardiovascular exam 12/08/2010  . Essential  hypertension 12/08/2010  . Hyperlipidemia 12/08/2010    Past Surgical History:  Procedure Laterality Date  . APPENDECTOMY  age 35  . CATARACT EXTRACTION W/ INTRAOCULAR LENS  IMPLANT, BILATERAL  2004  approx.  . CHOLECYSTECTOMY N/A 11/17/2012   Procedure: LAPAROSCOPIC CHOLECYSTECTOMY;  Surgeon: Leighton Ruff, MD;  Location: [facility];  Service: General;  Laterality: N/A;  . COLONOSCOPY W/ BIOPSIES  09/25/2002, 09/18/2001  . ERCP N/A 11/14/2012   Procedure: ENDOSCOPIC RETROGRADE CHOLANGIOPANCREATOGRAPHY (ERCP);  Surgeon: Jeryl Columbia, MD;  Location: [facility];  Service: Endoscopy;  Laterality: N/A;  .  ESOPHAGOGASTRODUODENOSCOPY ENDOSCOPY    . KYPHOPLASTY  01/2011  . LAPAROSCOPIC RIGHT HEMI COLECTOMY  10-03-2001    dr Hassell Done @[facility]   . [place]  2000  . porta catheter placement     REMOVAL 2005  . TEMPOROMANDIBULAR JOINT SURGERY Right 1990s  . TONSILLECTOMY  child  . TOTAL HIP ARTHROPLASTY Right 05/30/2015   Procedure: TOTAL HIP ARTHROPLASTY ANTERIOR APPROACH;  Surgeon: Rod Can, MD;  Location: [facility];  Service: Orthopedics;  Laterality: Right;  . TOTAL KNEE ARTHROPLASTY  12/20/2010   Procedure: TOTAL KNEE ARTHROPLASTY;  Surgeon: Mauri Pole;  Location: [facility];  Service: Orthopedics;  Laterality: Right;  . TOTAL KNEE ARTHROPLASTY Right 12-20-2010  dr Alvan Dame @[facility]   . UPPER GASTROINTESTINAL ENDOSCOPY  09/18/2001     OB History    Gravida  1   Para  1   Term  1   Preterm  0   AB  0   Living  1     SAB  0   TAB  0   Ectopic  0   Multiple  0   Live Births  1            Home Medications    Prior to Admission medications   Medication Sig Start Date End Date Taking? Authorizing Provider  acetaminophen (TYLENOL 8 HOUR ARTHRITIS PAIN) 650 MG CR tablet Take 1,300 mg by mouth 2 (two) times daily.    Yes [provider]  amoxicillin (AMOXIL) 500 MG tablet Take 500 mg by mouth 3 (three) times daily.   Yes [provider]  aspirin EC 81 MG tablet Take 81 mg by mouth daily.   Yes [provider]  Calcium-Vitamin D (CALTRATE 600 PLUS-VIT D PO) Take 1 tablet by mouth 2 (two) times daily.    Yes [provider]  docusate sodium (COLACE) 100 MG capsule Take 300 mg by mouth daily.    Yes [provider]  esomeprazole (NEXIUM) 40 MG capsule Take 40 mg by mouth every evening.    Yes [provider]  estradiol (ESTRACE) 0.5 MG tablet Take 0.25 mg by mouth at bedtime.  05/16/16  Yes [provider]  famotidine (PEPCID) 20 MG tablet Take 20 mg by mouth daily before breakfast.    Yes [provider]   ferrous sulfate 325 (65 FE) MG tablet Take 325 mg by mouth 2 (two) times daily.   Yes [provider]  levothyroxine (SYNTHROID, LEVOTHROID) 200 MCG tablet Take 200 mcg by mouth daily before breakfast.   Yes [provider]  medroxyPROGESTERone (PROVERA) 5 MG tablet Take 2.5 mg by mouth daily with supper.    Yes [provider]  metoprolol succinate (TOPROL-XL) 50 MG 24 hr tablet Take 50 mg by mouth every morning.    Yes [provider]  Multiple Vitamins-Minerals (CENTRUM SILVER ULTRA WOMENS PO) Take 1 tablet by mouth every evening.  Yes [provider]  Multiple Vitamins-Minerals (PRESERVISION AREDS PO) Take 2 capsules by mouth daily.    Yes [provider]  nortriptyline (PAMELOR) 25 MG capsule Take 75 mg by mouth daily.    Yes [provider]  OVER THE COUNTER MEDICATION Apply 1 application topically daily as needed. Stop Pain Roll On   Yes [provider]  Polyethyl Glycol-Propyl Glycol (SYSTANE ULTRA OP) Place 2 drops into both eyes at bedtime.   Yes [provider]  simvastatin (ZOCOR) 20 MG tablet Take 20 mg by mouth every evening.    Yes [provider]  vitamin B-12 (CYANOCOBALAMIN) 1000 MCG tablet Take 1,000 mcg by mouth daily.   Yes [provider]  vitamin C (ASCORBIC ACID) 500 MG tablet Take 500 mg by mouth daily.    Yes [provider]  Vitamin D, Ergocalciferol, (DRISDOL) 50000 units CAPS capsule take ONE CAPSULE BY MOUTH every 7 DAYS Patient taking differently: Take 50,000 Units by mouth every 30 (thirty) days. 1st day of each month 08/19/15  Yes Kem Boroughs, FNP  vitamin E 400 UNIT capsule Take 800 Units by mouth daily.    Yes [provider]  zolpidem (AMBIEN) 10 MG tablet Take 1 tablet (10 mg total) by mouth at bedtime. 06/01/15  Yes Theodis Blaze, MD  amoxicillin (AMOXIL) 500 MG capsule Take 1 capsule (500 mg total) by mouth 3 (three) times daily. Patient  not taking: Reported on 02/28/2018 02/01/18   Rolland Porter, MD  cephALEXin (KEFLEX) 500 MG capsule Take 500 mg by mouth 2 (two) times daily. 03/04/18   [provider]  cephALEXin (KEFLEX) 500 MG capsule Take 500 mg by mouth 2 (two) times daily. 03/05/18 03/15/18  [provider]  HYDROcodone-acetaminophen (NORCO/VICODIN) 5-325 MG tablet Take 2 tablets by mouth every 12 (twelve) hours as needed for severe pain. 03/04/18   Virgel Manifold, MD  HYDROcodone-acetaminophen (NORCO/VICODIN) 5-325 MG tablet Take 1 tablet by mouth every 6 (six) hours as needed for moderate pain.    [provider]    Family History Family History  Problem Relation Age of Onset  . Heart disease Mother        Enlarged heart  . Arrhythmia Mother   . Diabetes Mother   . Heart attack Mother   . Heart disease Brother        MI at unknown age  . Anesthesia problems Brother   . Heart disease Father   . Heart attack Father   . Cancer Son        non-hodgkins lymphoma  . Stroke Sister   . Heart attack Other        MI at age 69  . Heart disease Brother        CABG  . AAA (abdominal aortic aneurysm) Brother   . CAD Brother     Social History Social History   Tobacco Use  . Smoking status: Never Smoker  . Smokeless tobacco: Never Used  Substance Use Topics  . Alcohol use: No  . Drug use: No     Allergies   Tape; Latex; Lidocaine; and Sulfa antibiotics   Review of Systems Review of Systems  All systems reviewed and negative, other than as noted in HPI.  Physical Exam Updated Vital Signs BP (!) 190/85   Pulse 99   Temp 98 F (36.7 C)   Resp 20   LMP 01/17/1994   SpO2 97%   Physical Exam Vitals signs and nursing note  reviewed.  Constitutional:      General: She is not in acute distress.    Appearance: She is well-developed.  HENT:     Head: Normocephalic and atraumatic.  Eyes:     General:        Right eye: No discharge.        Left eye: No discharge.      Conjunctiva/sclera: Conjunctivae normal.  Neck:     Musculoskeletal: Neck supple.  Cardiovascular:     Rate and Rhythm: Normal rate and regular rhythm.     Heart sounds: Normal heart sounds. No murmur. No friction rub. No gallop.   Pulmonary:     Effort: Pulmonary effort is normal. No respiratory distress.     Breath sounds: Normal breath sounds.  Abdominal:     General: There is no distension.     Palpations: Abdomen is soft.     Comments: Very mild lower abdominal tenderness without rebound or guarding.  No distention.  Musculoskeletal:        General: No tenderness.  Skin:    General: Skin is warm and dry.  Neurological:     Mental Status: She is alert.  Psychiatric:        Behavior: Behavior normal.        Thought Content: Thought content normal.      ED Treatments / Results  Labs (all labs ordered are listed, but only abnormal results are displayed) Labs Reviewed  CBC WITH DIFFERENTIAL/PLATELET - Abnormal; Notable for the following components:      Result Value   WBC 12.1 (*)    RBC 2.91 (*)    Hemoglobin 9.0 (*)    HCT 29.4 (*)    MCV 101.0 (*)    Platelets 563 (*)    Neutro Abs 9.5 (*)    Monocytes Absolute 1.3 (*)    Abs Immature Granulocytes 0.10 (*)    All other components within normal limits  BASIC METABOLIC PANEL - Abnormal; Notable for the following components:   Sodium 131 (*)    Chloride 93 (*)    Glucose, Bld 172 (*)    GFR calc non Af Amer 58 (*)    All other components within normal limits    EKG None  Radiology No results found.  Procedures Procedures (including critical care time)  Medications Ordered in ED Medications  cefTRIAXone (ROCEPHIN) 1 g in sodium chloride 0.9 % 100 mL IVPB (0 g Intravenous Stopped 03/04/18 2003)  HYDROmorphone (DILAUDID) injection 0.5 mg (0.5 mg Intravenous Given 03/04/18 1959)     Initial Impression / Assessment and Plan / ED Course  I have reviewed the triage vital signs and the nursing  notes.  Pertinent labs & imaging results that were available during my care of the patient were reviewed by me and considered in my medical decision making (see chart for details).        85 year old female with hematuria.  Known problem.  Renal mass in recent diagnosis of UTI.  She was just recently changed to cephalexin based on sensitivities.  She was given a dose of Rocephin here in the emergency room today.  She describes a pain medicine to take on an as-needed basis for abdominal pain.  Her exam is pretty reassuring though.  Previously noted hyponatremia has improved since last check.  Her anemia is stable to slightly improved as well.  Return precautions were discussed.  Follow-up as otherwise scheduled with urology later this week.  Final Clinical Impressions(s) /  ED Diagnoses   Final diagnoses:  Hematuria, unspecified type    ED Discharge Orders         Ordered    HYDROcodone-acetaminophen (NORCO/VICODIN) 5-325 MG tablet  Every 12 hours PRN     03/04/18 2112           Virgel Manifold, MD 03/05/18 1826

## 2018-03-09 ENCOUNTER — Inpatient Hospital Stay (HOSPITAL_COMMUNITY): Payer: Medicare Other | Admitting: Certified Registered"

## 2018-03-09 ENCOUNTER — Other Ambulatory Visit: Payer: Self-pay

## 2018-03-09 ENCOUNTER — Inpatient Hospital Stay (HOSPITAL_COMMUNITY)
Admission: RE | Admit: 2018-03-09 | Discharge: 2018-03-15 | DRG: 659 | Disposition: A | Discharge status: Home / Self Care | Payer: Medicare Other | Attending: Urology | Admitting: Urology

## 2018-03-09 ENCOUNTER — Encounter (HOSPITAL_COMMUNITY): Payer: Self-pay | Admitting: *Deleted

## 2018-03-09 ENCOUNTER — Inpatient Hospital Stay (HOSPITAL_COMMUNITY): Payer: Medicare Other

## 2018-03-09 ENCOUNTER — Encounter (HOSPITAL_COMMUNITY)
Admission: RE | Disposition: A | Discharge status: Home / Self Care | Payer: Self-pay | Source: Home / Self Care | Attending: Urology

## 2018-03-09 ENCOUNTER — Inpatient Hospital Stay (HOSPITAL_COMMUNITY): Payer: Medicare Other | Admitting: Physician Assistant

## 2018-03-09 DIAGNOSIS — E43 Unspecified severe protein-calorie malnutrition: Secondary | ICD-10-CM | POA: Diagnosis present

## 2018-03-09 DIAGNOSIS — C786 Secondary malignant neoplasm of retroperitoneum and peritoneum: Secondary | ICD-10-CM | POA: Diagnosis present

## 2018-03-09 DIAGNOSIS — D62 Acute posthemorrhagic anemia: Secondary | ICD-10-CM | POA: Diagnosis not present

## 2018-03-09 DIAGNOSIS — M797 Fibromyalgia: Secondary | ICD-10-CM | POA: Diagnosis present

## 2018-03-09 DIAGNOSIS — K660 Peritoneal adhesions (postprocedural) (postinfection): Secondary | ICD-10-CM | POA: Diagnosis present

## 2018-03-09 DIAGNOSIS — K219 Gastro-esophageal reflux disease without esophagitis: Secondary | ICD-10-CM | POA: Diagnosis present

## 2018-03-09 DIAGNOSIS — M810 Age-related osteoporosis without current pathological fracture: Secondary | ICD-10-CM | POA: Diagnosis present

## 2018-03-09 DIAGNOSIS — Z6820 Body mass index (BMI) 20.0-20.9, adult: Secondary | ICD-10-CM

## 2018-03-09 DIAGNOSIS — I10 Essential (primary) hypertension: Secondary | ICD-10-CM | POA: Diagnosis present

## 2018-03-09 DIAGNOSIS — Z9221 Personal history of antineoplastic chemotherapy: Secondary | ICD-10-CM

## 2018-03-09 DIAGNOSIS — Z8249 Family history of ischemic heart disease and other diseases of the circulatory system: Secondary | ICD-10-CM

## 2018-03-09 DIAGNOSIS — I959 Hypotension, unspecified: Secondary | ICD-10-CM | POA: Diagnosis not present

## 2018-03-09 DIAGNOSIS — Z419 Encounter for procedure for purposes other than remedying health state, unspecified: Secondary | ICD-10-CM

## 2018-03-09 DIAGNOSIS — Z9842 Cataract extraction status, left eye: Secondary | ICD-10-CM | POA: Diagnosis not present

## 2018-03-09 DIAGNOSIS — C7970 Secondary malignant neoplasm of unspecified adrenal gland: Secondary | ICD-10-CM | POA: Diagnosis present

## 2018-03-09 DIAGNOSIS — E039 Hypothyroidism, unspecified: Secondary | ICD-10-CM | POA: Diagnosis present

## 2018-03-09 DIAGNOSIS — Z96651 Presence of right artificial knee joint: Secondary | ICD-10-CM | POA: Diagnosis present

## 2018-03-09 DIAGNOSIS — M1990 Unspecified osteoarthritis, unspecified site: Secondary | ICD-10-CM | POA: Diagnosis present

## 2018-03-09 DIAGNOSIS — N2889 Other specified disorders of kidney and ureter: Secondary | ICD-10-CM | POA: Diagnosis present

## 2018-03-09 DIAGNOSIS — Z9841 Cataract extraction status, right eye: Secondary | ICD-10-CM

## 2018-03-09 DIAGNOSIS — R197 Diarrhea, unspecified: Secondary | ICD-10-CM | POA: Diagnosis not present

## 2018-03-09 DIAGNOSIS — Z882 Allergy status to sulfonamides status: Secondary | ICD-10-CM

## 2018-03-09 DIAGNOSIS — I823 Embolism and thrombosis of renal vein: Secondary | ICD-10-CM | POA: Diagnosis present

## 2018-03-09 DIAGNOSIS — E8770 Fluid overload, unspecified: Secondary | ICD-10-CM | POA: Diagnosis not present

## 2018-03-09 DIAGNOSIS — Z91040 Latex allergy status: Secondary | ICD-10-CM

## 2018-03-09 DIAGNOSIS — F329 Major depressive disorder, single episode, unspecified: Secondary | ICD-10-CM | POA: Diagnosis present

## 2018-03-09 DIAGNOSIS — R310 Gross hematuria: Secondary | ICD-10-CM | POA: Diagnosis present

## 2018-03-09 DIAGNOSIS — E1151 Type 2 diabetes mellitus with diabetic peripheral angiopathy without gangrene: Secondary | ICD-10-CM | POA: Diagnosis present

## 2018-03-09 DIAGNOSIS — Z85038 Personal history of other malignant neoplasm of large intestine: Secondary | ICD-10-CM

## 2018-03-09 DIAGNOSIS — Z961 Presence of intraocular lens: Secondary | ICD-10-CM | POA: Diagnosis present

## 2018-03-09 DIAGNOSIS — Z86010 Personal history of colonic polyps: Secondary | ICD-10-CM

## 2018-03-09 DIAGNOSIS — H3530 Unspecified macular degeneration: Secondary | ICD-10-CM | POA: Diagnosis present

## 2018-03-09 DIAGNOSIS — Z91048 Other nonmedicinal substance allergy status: Secondary | ICD-10-CM

## 2018-03-09 DIAGNOSIS — Z888 Allergy status to other drugs, medicaments and biological substances status: Secondary | ICD-10-CM

## 2018-03-09 DIAGNOSIS — I2510 Atherosclerotic heart disease of native coronary artery without angina pectoris: Secondary | ICD-10-CM | POA: Diagnosis present

## 2018-03-09 HISTORY — DX: Urinary tract infection, site not specified: N39.0

## 2018-03-09 HISTORY — PX: ROBOT ASSISTED LAPAROSCOPIC NEPHRECTOMY: SHX5140

## 2018-03-09 LAB — POCT I-STAT 7, (LYTES, BLD GAS, ICA,H+H)
Acid-Base Excess: 8 mmol/L — ABNORMAL HIGH (ref 0.0–2.0)
Acid-Base Excess: 5 mmol/L — ABNORMAL HIGH (ref 0.0–2.0)
BICARBONATE: 30.6 mmol/L — AB (ref 20.0–28.0)
Bicarbonate: 32.9 mmol/L — ABNORMAL HIGH (ref 20.0–28.0)
Calcium, Ion: 1.12 mmol/L — ABNORMAL LOW (ref 1.15–1.40)
Calcium, Ion: 1.11 mmol/L — ABNORMAL LOW (ref 1.15–1.40)
HCT: 26 % — ABNORMAL LOW (ref 36.0–46.0)
HCT: 24 % — ABNORMAL LOW (ref 36.0–46.0)
Hemoglobin: 8.8 g/dL — ABNORMAL LOW (ref 12.0–15.0)
Hemoglobin: 8.2 g/dL — ABNORMAL LOW (ref 12.0–15.0)
O2 SAT: 100 %
O2 Saturation: 100 %
PH ART: 7.478 — AB (ref 7.350–7.450)
PO2 ART: 485 mmHg — AB (ref 83.0–108.0)
Patient temperature: 35.1
Potassium: 3.8 mmol/L (ref 3.5–5.1)
Potassium: 3.9 mmol/L (ref 3.5–5.1)
SODIUM: 132 mmol/L — AB (ref 135–145)
Sodium: 132 mmol/L — ABNORMAL LOW (ref 135–145)
TCO2: 34 mmol/L — AB (ref 22–32)
TCO2: 32 mmol/L (ref 22–32)
pCO2 arterial: 43.6 mmHg (ref 32.0–48.0)
pCO2 arterial: 50.5 mmHg — ABNORMAL HIGH (ref 32.0–48.0)
pH, Arterial: 7.39 (ref 7.350–7.450)
pO2, Arterial: 509 mmHg — ABNORMAL HIGH (ref 83.0–108.0)

## 2018-03-09 LAB — BASIC METABOLIC PANEL
Anion gap: 8 (ref 5–15)
BUN: 16 mg/dL (ref 8–23)
CHLORIDE: 96 mmol/L — AB (ref 98–111)
CO2: 26 mmol/L (ref 22–32)
Calcium: 7.9 mg/dL — ABNORMAL LOW (ref 8.9–10.3)
Creatinine, Ser: 0.9 mg/dL (ref 0.44–1.00)
GFR calc Af Amer: >60 mL/min (ref >60)
GFR calc non Af Amer: 59 mL/min — ABNORMAL LOW (ref >60)
Glucose, Bld: 211 mg/dL — ABNORMAL HIGH (ref 70–99)
Potassium: 4.5 mmol/L (ref 3.5–5.1)
SODIUM: 130 mmol/L — AB (ref 135–145)

## 2018-03-09 LAB — PREPARE RBC (CROSSMATCH)

## 2018-03-09 LAB — GLUCOSE, CAPILLARY
GLUCOSE-CAPILLARY: 181 mg/dL — AB (ref 70–99)
Glucose-Capillary: 156 mg/dL — ABNORMAL HIGH (ref 70–99)
Glucose-Capillary: 198 mg/dL — ABNORMAL HIGH (ref 70–99)

## 2018-03-09 LAB — HEMOGLOBIN AND HEMATOCRIT, BLOOD
HEMATOCRIT: 34.5 % — AB (ref 36.0–46.0)
Hemoglobin: 11 g/dL — ABNORMAL LOW (ref 12.0–15.0)

## 2018-03-09 SURGERY — NEPHRECTOMY, RADICAL, ROBOT-ASSISTED, LAPAROSCOPIC, ADULT
Anesthesia: General | Laterality: Left

## 2018-03-09 MED ORDER — SODIUM CHLORIDE (PF) 0.9 % IJ SOLN
INTRAMUSCULAR | Status: AC
  Filled 2018-03-09: qty 10

## 2018-03-09 MED ORDER — PROPOFOL 10 MG/ML IV BOLUS
INTRAVENOUS | Status: DC | PRN
  Administered 2018-03-09: 100 mg via INTRAVENOUS

## 2018-03-09 MED ORDER — ACETAMINOPHEN 500 MG PO TABS
1000.0000 mg | ORAL_TABLET | Freq: Four times a day (QID) | ORAL | Status: AC
  Administered 2018-03-09 – 2018-03-10 (×3): 1000 mg via ORAL
  Filled 2018-03-09 (×3): qty 2

## 2018-03-09 MED ORDER — FENTANYL CITRATE (PF) 250 MCG/5ML IJ SOLN
INTRAMUSCULAR | Status: DC | PRN
  Administered 2018-03-09: 50 ug via INTRAVENOUS
  Administered 2018-03-09 (×2): 25 ug via INTRAVENOUS
  Administered 2018-03-09 (×2): 50 ug via INTRAVENOUS

## 2018-03-09 MED ORDER — POLYETHYL GLYCOL-PROPYL GLYCOL 0.4-0.3 % OP SOLN: 2.0000 [drp] | Freq: Every day | OPHTHALMIC | Status: DC

## 2018-03-09 MED ORDER — MEDROXYPROGESTERONE ACETATE 2.5 MG PO TABS
2.5000 mg | ORAL_TABLET | Freq: Every day | ORAL | Status: DC
  Administered 2018-03-09 – 2018-03-14 (×5): 2.5 mg via ORAL
  Filled 2018-03-09 (×7): qty 1

## 2018-03-09 MED ORDER — ROCURONIUM BROMIDE 100 MG/10ML IV SOLN
INTRAVENOUS | Status: AC
  Filled 2018-03-09: qty 1

## 2018-03-09 MED ORDER — BUPIVACAINE LIPOSOME 1.3 % IJ SUSP
20.0000 mL | Freq: Once | INTRAMUSCULAR | Status: DC
  Filled 2018-03-09: qty 20

## 2018-03-09 MED ORDER — SODIUM CHLORIDE 0.9 % IV SOLN
10.0000 mL/h | Freq: Once | INTRAVENOUS | Status: AC
  Administered 2018-03-09: 12:00:00 via INTRAVENOUS

## 2018-03-09 MED ORDER — ACETAMINOPHEN 10 MG/ML IV SOLN
INTRAVENOUS | Status: AC
  Filled 2018-03-09: qty 100

## 2018-03-09 MED ORDER — BOOST / RESOURCE BREEZE PO LIQD CUSTOM
1.0000 | Freq: Three times a day (TID) | ORAL | Status: DC
  Administered 2018-03-09 – 2018-03-13 (×9): 1 via ORAL

## 2018-03-09 MED ORDER — ONDANSETRON HCL 4 MG/2ML IJ SOLN: 4.0000 mg | INTRAMUSCULAR | Status: DC | PRN

## 2018-03-09 MED ORDER — FERROUS SULFATE 325 (65 FE) MG PO TABS
325.0000 mg | ORAL_TABLET | Freq: Two times a day (BID) | ORAL | Status: DC
  Administered 2018-03-09 – 2018-03-15 (×12): 325 mg via ORAL
  Filled 2018-03-09 (×12): qty 1

## 2018-03-09 MED ORDER — FAMOTIDINE 20 MG PO TABS
20.0000 mg | ORAL_TABLET | Freq: Every day | ORAL | Status: DC
  Administered 2018-03-10 – 2018-03-15 (×6): 20 mg via ORAL
  Filled 2018-03-09 (×6): qty 1

## 2018-03-09 MED ORDER — MORPHINE SULFATE (PF) 4 MG/ML IV SOLN: 1.0000 mg | INTRAVENOUS | Status: DC | PRN

## 2018-03-09 MED ORDER — OXYCODONE HCL 5 MG PO TABS: 5.0000 mg | ORAL_TABLET | ORAL | Status: DC | PRN

## 2018-03-09 MED ORDER — SODIUM CHLORIDE (PF) 0.9 % IJ SOLN
INTRAMUSCULAR | Status: AC
  Filled 2018-03-09: qty 20

## 2018-03-09 MED ORDER — METOPROLOL SUCCINATE ER 50 MG PO TB24
50.0000 mg | ORAL_TABLET | Freq: Every morning | ORAL | Status: DC
  Administered 2018-03-11 – 2018-03-15 (×5): 50 mg via ORAL
  Filled 2018-03-09 (×6): qty 1

## 2018-03-09 MED ORDER — ONDANSETRON HCL 4 MG/2ML IJ SOLN
INTRAMUSCULAR | Status: AC
  Filled 2018-03-09: qty 2

## 2018-03-09 MED ORDER — CEFAZOLIN SODIUM-DEXTROSE 2-4 GM/100ML-% IV SOLN
2.0000 g | INTRAVENOUS | Status: AC
  Administered 2018-03-09: 2 g via INTRAVENOUS
  Filled 2018-03-09: qty 100

## 2018-03-09 MED ORDER — ZOLPIDEM TARTRATE 10 MG PO TABS
10.0000 mg | ORAL_TABLET | Freq: Every day | ORAL | Status: DC
  Administered 2018-03-10 – 2018-03-14 (×5): 10 mg via ORAL
  Filled 2018-03-09 (×6): qty 1

## 2018-03-09 MED ORDER — PROPOFOL 500 MG/50ML IV EMUL
INTRAVENOUS | Status: DC | PRN
  Administered 2018-03-09: 20 ug/kg/min via INTRAVENOUS

## 2018-03-09 MED ORDER — METOCLOPRAMIDE HCL 5 MG/ML IJ SOLN: 10.0000 mg | Freq: Once | INTRAMUSCULAR | Status: DC | PRN

## 2018-03-09 MED ORDER — DEXAMETHASONE SODIUM PHOSPHATE 10 MG/ML IJ SOLN
INTRAMUSCULAR | Status: DC | PRN
  Administered 2018-03-09: 4 mg via INTRAVENOUS

## 2018-03-09 MED ORDER — DOCUSATE SODIUM 100 MG PO CAPS
300.0000 mg | ORAL_CAPSULE | Freq: Every day | ORAL | Status: DC
  Administered 2018-03-10 – 2018-03-15 (×4): 300 mg via ORAL
  Filled 2018-03-09 (×6): qty 3

## 2018-03-09 MED ORDER — ROCURONIUM BROMIDE 10 MG/ML (PF) SYRINGE
PREFILLED_SYRINGE | INTRAVENOUS | Status: DC | PRN
  Administered 2018-03-09: 50 mg via INTRAVENOUS
  Administered 2018-03-09 (×2): 20 mg via INTRAVENOUS
  Administered 2018-03-09: 10 mg via INTRAVENOUS

## 2018-03-09 MED ORDER — DEXAMETHASONE SODIUM PHOSPHATE 10 MG/ML IJ SOLN
INTRAMUSCULAR | Status: AC
  Filled 2018-03-09: qty 1

## 2018-03-09 MED ORDER — ESTRADIOL 1 MG PO TABS
0.5000 mg | ORAL_TABLET | Freq: Every day | ORAL | Status: DC
  Administered 2018-03-09 – 2018-03-14 (×6): 0.5 mg via ORAL
  Filled 2018-03-09 (×6): qty 0.5

## 2018-03-09 MED ORDER — PHENYLEPHRINE 40 MCG/ML (10ML) SYRINGE FOR IV PUSH (FOR BLOOD PRESSURE SUPPORT)
PREFILLED_SYRINGE | INTRAVENOUS | Status: AC
  Filled 2018-03-09: qty 10

## 2018-03-09 MED ORDER — LIDOCAINE 2% (20 MG/ML) 5 ML SYRINGE
INTRAMUSCULAR | Status: AC
  Filled 2018-03-09: qty 5

## 2018-03-09 MED ORDER — PNEUMOCOCCAL VAC POLYVALENT 25 MCG/0.5ML IJ INJ
0.5000 mL | INJECTION | INTRAMUSCULAR | Status: DC
  Filled 2018-03-09: qty 0.5

## 2018-03-09 MED ORDER — SUGAMMADEX SODIUM 200 MG/2ML IV SOLN
INTRAVENOUS | Status: DC | PRN
  Administered 2018-03-09: 150 mg via INTRAVENOUS

## 2018-03-09 MED ORDER — VITAMIN B-12 1000 MCG PO TABS
1000.0000 ug | ORAL_TABLET | Freq: Every day | ORAL | Status: DC
  Administered 2018-03-09 – 2018-03-15 (×7): 1000 ug via ORAL
  Filled 2018-03-09 (×7): qty 1

## 2018-03-09 MED ORDER — PROPOFOL 10 MG/ML IV BOLUS
INTRAVENOUS | Status: AC
  Filled 2018-03-09: qty 20

## 2018-03-09 MED ORDER — PHENYLEPHRINE 40 MCG/ML (10ML) SYRINGE FOR IV PUSH (FOR BLOOD PRESSURE SUPPORT)
PREFILLED_SYRINGE | INTRAVENOUS | Status: DC | PRN
  Administered 2018-03-09 (×3): 80 ug via INTRAVENOUS

## 2018-03-09 MED ORDER — MORPHINE SULFATE (PF) 2 MG/ML IV SOLN
2.0000 mg | INTRAVENOUS | Status: DC | PRN
  Administered 2018-03-09: 2 mg via INTRAVENOUS
  Filled 2018-03-09: qty 1

## 2018-03-09 MED ORDER — MEPERIDINE HCL 50 MG/ML IJ SOLN: 6.2500 mg | INTRAMUSCULAR | Status: DC | PRN

## 2018-03-09 MED ORDER — LACTATED RINGERS IV SOLN
INTRAVENOUS | Status: DC
  Administered 2018-03-09: 10:00:00 via INTRAVENOUS

## 2018-03-09 MED ORDER — ASPIRIN EC 81 MG PO TBEC
81.0000 mg | DELAYED_RELEASE_TABLET | Freq: Every day | ORAL | Status: DC
  Administered 2018-03-10 – 2018-03-15 (×6): 81 mg via ORAL
  Filled 2018-03-09 (×6): qty 1

## 2018-03-09 MED ORDER — SIMVASTATIN 20 MG PO TABS
20.0000 mg | ORAL_TABLET | Freq: Every evening | ORAL | Status: DC
  Administered 2018-03-09 – 2018-03-14 (×5): 20 mg via ORAL
  Filled 2018-03-09 (×5): qty 1

## 2018-03-09 MED ORDER — VITAMIN D (ERGOCALCIFEROL) 1.25 MG (50000 UNIT) PO CAPS: 50000.0000 [IU] | ORAL_CAPSULE | ORAL | Status: DC

## 2018-03-09 MED ORDER — SUGAMMADEX SODIUM 200 MG/2ML IV SOLN
INTRAVENOUS | Status: AC
  Filled 2018-03-09: qty 2

## 2018-03-09 MED ORDER — ONDANSETRON HCL 4 MG/2ML IJ SOLN
INTRAMUSCULAR | Status: DC | PRN
  Administered 2018-03-09: 4 mg via INTRAVENOUS

## 2018-03-09 MED ORDER — VITAMIN C 500 MG PO TABS
500.0000 mg | ORAL_TABLET | Freq: Every day | ORAL | Status: DC
  Administered 2018-03-09 – 2018-03-15 (×7): 500 mg via ORAL
  Filled 2018-03-09 (×7): qty 1

## 2018-03-09 MED ORDER — LACTATED RINGERS IV SOLN
INTRAVENOUS | Status: DC | PRN
  Administered 2018-03-09 (×2): via INTRAVENOUS

## 2018-03-09 MED ORDER — LEVOTHYROXINE SODIUM 200 MCG PO TABS
200.0000 ug | ORAL_TABLET | Freq: Every day | ORAL | Status: DC
  Administered 2018-03-10 – 2018-03-15 (×6): 200 ug via ORAL
  Filled 2018-03-09 (×2): qty 1
  Filled 2018-03-09: qty 2
  Filled 2018-03-09: qty 1
  Filled 2018-03-09: qty 2
  Filled 2018-03-09 (×2): qty 1
  Filled 2018-03-09 (×4): qty 2
  Filled 2018-03-09: qty 1

## 2018-03-09 MED ORDER — CEPHALEXIN 500 MG PO CAPS
500.0000 mg | ORAL_CAPSULE | Freq: Two times a day (BID) | ORAL | Status: DC
  Administered 2018-03-09 – 2018-03-15 (×12): 500 mg via ORAL
  Filled 2018-03-09 (×12): qty 1

## 2018-03-09 MED ORDER — LABETALOL HCL 5 MG/ML IV SOLN
INTRAVENOUS | Status: DC | PRN
  Administered 2018-03-09 (×3): 5 mg via INTRAVENOUS

## 2018-03-09 MED ORDER — ACETAMINOPHEN 10 MG/ML IV SOLN
INTRAVENOUS | Status: DC | PRN
  Administered 2018-03-09: 1000 mg via INTRAVENOUS

## 2018-03-09 MED ORDER — PANTOPRAZOLE SODIUM 40 MG PO TBEC
80.0000 mg | DELAYED_RELEASE_TABLET | Freq: Every day | ORAL | Status: DC
  Administered 2018-03-09 – 2018-03-14 (×6): 80 mg via ORAL
  Filled 2018-03-09 (×6): qty 2

## 2018-03-09 MED ORDER — FENTANYL CITRATE (PF) 250 MCG/5ML IJ SOLN
INTRAMUSCULAR | Status: AC
  Filled 2018-03-09: qty 5

## 2018-03-09 MED ORDER — POLYVINYL ALCOHOL 1.4 % OP SOLN
2.0000 [drp] | Freq: Every day | OPHTHALMIC | Status: DC
  Administered 2018-03-09 – 2018-03-14 (×6): 2 [drp] via OPHTHALMIC
  Filled 2018-03-09: qty 15

## 2018-03-09 MED ORDER — SODIUM CHLORIDE 0.9 % IV BOLUS
500.0000 mL | Freq: Once | INTRAVENOUS | Status: AC
  Administered 2018-03-09: 500 mL via INTRAVENOUS

## 2018-03-09 MED ORDER — NORTRIPTYLINE HCL 25 MG PO CAPS
75.0000 mg | ORAL_CAPSULE | Freq: Every day | ORAL | Status: DC
  Administered 2018-03-10 – 2018-03-15 (×6): 75 mg via ORAL
  Filled 2018-03-09 (×6): qty 3

## 2018-03-09 MED ORDER — VITAMIN E 180 MG (400 UNIT) PO CAPS
800.0000 [IU] | ORAL_CAPSULE | Freq: Every day | ORAL | Status: DC
  Administered 2018-03-10 – 2018-03-15 (×6): 800 [IU] via ORAL
  Filled 2018-03-09 (×6): qty 2

## 2018-03-09 MED ORDER — SODIUM CHLORIDE 0.9 % IV SOLN
INTRAVENOUS | Status: DC
  Administered 2018-03-09 – 2018-03-10 (×4): via INTRAVENOUS

## 2018-03-09 SURGICAL SUPPLY — 61 items
BAG LAPAROSCOPIC 12 15 PORT 16 (BASKET) ×1 IMPLANT
BAG RETRIEVAL 12/15 (BASKET) ×2
BAG RETRIEVAL 12/15MM (BASKET) ×1
CHLORAPREP W/TINT 26ML (MISCELLANEOUS) ×3 IMPLANT
CLIP VESOLOCK LG 6/CT PURPLE (CLIP) ×6 IMPLANT
CLIP VESOLOCK MED LG 6/CT (CLIP) ×3 IMPLANT
CLIP VESOLOCK XL 6/CT (CLIP) ×6 IMPLANT
COVER SURGICAL LIGHT HANDLE (MISCELLANEOUS) ×3 IMPLANT
COVER TIP SHEARS 8 DVNC (MISCELLANEOUS) ×1 IMPLANT
COVER TIP SHEARS 8MM DA VINCI (MISCELLANEOUS) ×2
COVER WAND RF STERILE (DRAPES) IMPLANT
CUTTER ECHEON FLEX ENDO 45 340 (ENDOMECHANICALS) ×3 IMPLANT
DECANTER SPIKE VIAL GLASS SM (MISCELLANEOUS) ×3 IMPLANT
DERMABOND ADVANCED (GAUZE/BANDAGES/DRESSINGS) ×2
DERMABOND ADVANCED .7 DNX12 (GAUZE/BANDAGES/DRESSINGS) ×1 IMPLANT
DRAIN CHANNEL 15F RND FF 3/16 (WOUND CARE) ×3 IMPLANT
DRAPE ARM DVNC X/XI (DISPOSABLE) ×4 IMPLANT
DRAPE COLUMN DVNC XI (DISPOSABLE) ×1 IMPLANT
DRAPE DA VINCI XI ARM (DISPOSABLE) ×8
DRAPE DA VINCI XI COLUMN (DISPOSABLE) ×2
DRAPE INCISE IOBAN 66X45 STRL (DRAPES) ×3 IMPLANT
DRAPE SHEET LG 3/4 BI-LAMINATE (DRAPES) ×3 IMPLANT
DRSG TEGADERM 4X4.75 (GAUZE/BANDAGES/DRESSINGS) ×3 IMPLANT
ELECT PENCIL ROCKER SW 15FT (MISCELLANEOUS) ×3 IMPLANT
ELECT REM PT RETURN 15FT ADLT (MISCELLANEOUS) ×3 IMPLANT
EVACUATOR SILICONE 100CC (DRAIN) ×3 IMPLANT
GLOVE BIO SURGEON STRL SZ 6.5 (GLOVE) IMPLANT
GLOVE BIO SURGEON STRL SZ8 (GLOVE) IMPLANT
GLOVE BIO SURGEONS STRL SZ 6.5 (GLOVE)
GLOVE BIOGEL PI IND STRL 8 (GLOVE) ×2 IMPLANT
GLOVE BIOGEL PI INDICATOR 8 (GLOVE) ×4
GLOVE SURG SS PI 8.0 STRL IVOR (GLOVE) ×6 IMPLANT
GOWN STRL REUS W/TWL LRG LVL3 (GOWN DISPOSABLE) ×9 IMPLANT
HEMOSTAT SURGICEL 4X8 (HEMOSTASIS) ×3 IMPLANT
HOLDER FOLEY CATH W/STRAP (MISCELLANEOUS) ×3 IMPLANT
IRRIG SUCT STRYKERFLOW 2 WTIP (MISCELLANEOUS) ×3
IRRIGATION SUCT STRKRFLW 2 WTP (MISCELLANEOUS) ×1 IMPLANT
KIT BASIN OR (CUSTOM PROCEDURE TRAY) ×3 IMPLANT
LOOP VESSEL MAXI BLUE (MISCELLANEOUS) IMPLANT
NEEDLE INSUFFLATION 14GA 120MM (NEEDLE) ×3 IMPLANT
PROTECTOR NERVE ULNAR (MISCELLANEOUS) ×6 IMPLANT
SEAL CANN UNIV 5-8 DVNC XI (MISCELLANEOUS) ×4 IMPLANT
SEAL XI 5MM-8MM UNIVERSAL (MISCELLANEOUS) ×8
SOLUTION ELECTROLUBE (MISCELLANEOUS) ×3 IMPLANT
SPONGE LAP 4X18 RFD (DISPOSABLE) IMPLANT
STAPLE RELOAD 45 WHT (STAPLE) ×4 IMPLANT
STAPLE RELOAD 45MM WHITE (STAPLE) ×8
SUT ETHILON 3 0 PS 1 (SUTURE) ×3 IMPLANT
SUT MNCRL AB 4-0 PS2 18 (SUTURE) ×6 IMPLANT
SUT PDS AB 1 TP1 96 (SUTURE) ×3 IMPLANT
SUT VIC AB 0 CT1 27 (SUTURE) ×2
SUT VIC AB 0 CT1 27XBRD ANTBC (SUTURE) ×1 IMPLANT
SUT VICRYL 0 UR6 27IN ABS (SUTURE) ×6 IMPLANT
TAPE CLOTH 4X10 WHT NS (GAUZE/BANDAGES/DRESSINGS) ×3 IMPLANT
TOWEL OR NON WOVEN STRL DISP B (DISPOSABLE) ×3 IMPLANT
TRAY FOLEY METER SIL LF 16FR (CATHETERS) ×3 IMPLANT
TRAY FOLEY MTR SLVR 16FR STAT (SET/KITS/TRAYS/PACK) IMPLANT
TRAY LAPAROSCOPIC (CUSTOM PROCEDURE TRAY) ×3 IMPLANT
TROCAR BLADELESS OPT 5 100 (ENDOMECHANICALS) IMPLANT
TROCAR XCEL 12X100 BLDLESS (ENDOMECHANICALS) ×3 IMPLANT
WATER STERILE IRR 1000ML POUR (IV SOLUTION) ×3 IMPLANT

## 2018-03-09 NOTE — Anesthesia Procedure Notes (Signed)
Procedure Name: Intubation Date/Time: 03/09/2018 12:02 PM Performed by: Niel Hummer, CRNA Pre-anesthesia Checklist: Patient identified, Emergency Drugs available, Suction available and Patient being monitored Patient Re-evaluated:Patient Re-evaluated prior to induction Oxygen Delivery Method: Circle system utilized Preoxygenation: Pre-oxygenation with 100% oxygen Induction Type: IV induction Ventilation: Mask ventilation without difficulty Laryngoscope Size: Glidescope and 4 Grade View: Grade I Tube type: Oral Tube size: 7.0 mm Number of attempts: 1 Airway Equipment and Method: Video-laryngoscopy and Stylet Placement Confirmation: ETT inserted through vocal cords under direct vision,  positive ETCO2 and breath sounds checked- equal and bilateral Secured at: 20 cm Tube secured with: Tape Dental Injury: Teeth and Oropharynx as per pre-operative assessment  Comments: Grade 1 view with glidescope. Trachea deviated to right, able to place tube successfully.

## 2018-03-09 NOTE — Transfer of Care (Signed)
Immediate Anesthesia Transfer of Care Note  Patient: [name]  Procedure(s) Performed: XI ROBOTIC ASSISTED LAPAROSCOPIC NEPHRECTOMY (Left )  Patient Location: PACU  Anesthesia Type:General  Level of Consciousness: sedated, patient cooperative and responds to stimulation  Airway & Oxygen Therapy: Patient Spontanous Breathing and Patient connected to face mask oxygen  Post-op Assessment: Report given to RN and Post -op Vital signs reviewed and stable  Post vital signs: Reviewed and stable  Last Vitals:  Vitals Value Taken Time  BP 167/98 03/09/2018  3:33 PM  Temp    Pulse    Resp 20 03/09/2018  3:36 PM  SpO2    Vitals shown include unvalidated device data.  Last Pain:  Vitals:   03/09/18 1000  TempSrc:   PainSc: 7          Complications: No apparent anesthesia complications

## 2018-03-09 NOTE — H&P (Signed)
Urology Admission H&P  Chief Complaint: gross hematuria  History of Present Illness: Ms [name] is a 85yo with a hx of gross hematuria who was found to have a large left renal mass with a ipsalateral adrenal metastasis. She denies nay fevers/chill/sweats. She had had a 40lb weight loss.   Past Medical History:  Diagnosis Date  . Abdominal pain   . Anemia   . Calcification of aorta (HCC) 11/16/2012  . Coronary artery calcification 02/14/2018   cardiologist-- dr t. Oval Linsey  . Depression   . Diabetes mellitus type 2, diet-controlled ([place])    pt denies  . DJD (degenerative joint disease)   . DM II (diabetes mellitus, type II), controlled 11/16/2012  . Dysuria   . Epistaxis   . Family history of adverse reaction to anesthesia    sister--- ponv  . Fibromyalgia   . GERD (gastroesophageal reflux disease)   . Goiter   . Gross hematuria   . Headache(784.0)    tension headache daily per pt   . Heart murmur   . Hiatal hernia   . History of vertebral compression fracture   . Hypertension   . Hypothyroidism    endocrinologist-- dr Chalmers Cater  . Left adrenal mass ([place])   . Left renal mass   . Left thyroid nodule    hx biopsy 2013, benign  . Macular degeneration    both  left > right  . Osteoarthritis   . Osteoporosis   . Peripheral vascular disease ([place])    varicose veins in left arm   . 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 PONV (postoperative nausea and vomiting)   . Renal cell cancer, left [facility])    oncologist-- dr Alen Blew  . TMJ syndrome    right   . UTI (urinary tract infection) 02/28/2018  . Wears glasses    Past Surgical History:  Procedure Laterality Date  . APPENDECTOMY  age 81  . CATARACT EXTRACTION W/ INTRAOCULAR LENS  IMPLANT, BILATERAL  2004  approx.  .  CHOLECYSTECTOMY N/A 11/17/2012   Procedure: LAPAROSCOPIC CHOLECYSTECTOMY;  Surgeon: Leighton Ruff, MD;  Location: [facility];  Service: General;  Laterality: N/A;  . COLONOSCOPY W/ BIOPSIES  09/25/2002, 09/18/2001  . ERCP N/A 11/14/2012   Procedure: ENDOSCOPIC RETROGRADE CHOLANGIOPANCREATOGRAPHY (ERCP);  Surgeon: Jeryl Columbia, MD;  Location: [facility];  Service: Endoscopy;  Laterality: N/A;  . ESOPHAGOGASTRODUODENOSCOPY ENDOSCOPY    . KYPHOPLASTY  01/2011  . LAPAROSCOPIC RIGHT HEMI COLECTOMY  10-03-2001    dr Hassell Done @[facility]   . [place]  2000  . porta catheter placement     REMOVAL 2005  . TEMPOROMANDIBULAR JOINT SURGERY Right 1990s  . TONSILLECTOMY  child  . TOTAL HIP ARTHROPLASTY Right 05/30/2015   Procedure: TOTAL HIP ARTHROPLASTY ANTERIOR APPROACH;  Surgeon: Rod Can, MD;  Location: [facility];  Service: Orthopedics;  Laterality: Right;  . TOTAL KNEE ARTHROPLASTY  12/20/2010   Procedure: TOTAL KNEE ARTHROPLASTY;  Surgeon: Mauri Pole;  Location: [facility];  Service: Orthopedics;  Laterality: Right;  . TOTAL KNEE ARTHROPLASTY Right 12-20-2010  dr Alvan Dame @[facility]   . UPPER GASTROINTESTINAL ENDOSCOPY  09/18/2001    Home Medications:  Current Facility-Administered Medications  Medication Dose Route Frequency Provider Last Rate Last Dose  . bupivacaine liposome (EXPAREL) 1.3 %  injection 266 mg  20 mL Infiltration Once Cleon Gustin, MD      . ceFAZolin (ANCEF) IVPB 2g/100 mL premix  2 g Intravenous 30 min Pre-Op Alyson Ingles Candee Furbish, MD      . lactated ringers infusion   Intravenous Continuous Catalina Gravel, MD 75 mL/hr at 03/09/18 1010     Allergies:  Allergies  Allergen Reactions  . Tape Rash    Blisters  . Latex Rash and Other (See Comments)    blister  . Lidocaine Nausea And Vomiting  . Sulfa Antibiotics Nausea And Vomiting    Family History  Problem Relation Age of Onset  . Heart disease Mother        Enlarged heart  . Arrhythmia Mother   . Diabetes Mother   . Heart  attack Mother   . Heart disease Brother        MI at unknown age  . Anesthesia problems Brother   . Heart disease Father   . Heart attack Father   . Cancer Son        non-hodgkins lymphoma  . Stroke Sister   . Heart attack Other        MI at age 59  . Heart disease Brother        CABG  . AAA (abdominal aortic aneurysm) Brother   . CAD Brother    Social History:  reports that she has never smoked. She has never used smokeless tobacco. She reports that she does not drink alcohol or use drugs.  Review of Systems  Constitutional: Positive for malaise/fatigue.  Genitourinary: Positive for hematuria.  All other systems reviewed and are negative.   Physical Exam:  Vital signs in last 24 hours: Temp:  [98.1 F (36.7 C)] 98.1 F (36.7 C) (02/21 0912) Pulse Rate:  [99] 99 (02/21 0912) Resp:  [20] 20 (02/21 0912) BP: (170)/(103) 170/103 (02/21 0912) SpO2:  [97 %] 97 % (02/21 0912) Weight:  [55.2 kg] 55.2 kg (02/21 1000) Physical Exam  Constitutional: She is oriented to person, place, and time. She appears well-developed and well-nourished.  HENT:  Head: Normocephalic and atraumatic.  Eyes: Pupils are equal, round, and reactive to light. EOM are normal.  Neck: Normal range of motion. No thyromegaly present.  Cardiovascular: Normal rate and regular rhythm.  Respiratory: Effort normal. No respiratory distress.  GI: Soft. She exhibits no distension.  Musculoskeletal: Normal range of motion.        General: No edema.  Neurological: She is alert and oriented to person, place, and time.  Skin: Skin is warm and dry.  Psychiatric: She has a normal mood and affect. Her behavior is normal. Judgment and thought content normal.    Laboratory Data:  Results for orders placed or performed during the hospital encounter of 03/09/18 (from the past 24 hour(s))  Glucose, capillary     Status: Abnormal   Collection Time: 03/09/18  9:55 AM  Result Value Ref Range   Glucose-Capillary 156 (H) 70  - 99 mg/dL   Recent Results (from the past 240 hour(s))  Urine culture     Status: Abnormal   Collection Time: 02/27/18  6:43 PM  Result Value Ref Range Status   Specimen Description   Final    URINE, RANDOM Performed at [facility], [address], [place], [place] [address]    Special Requests   Final    NONE Performed at [facility], [address], [place],  [address]    Culture (  A)  Final    70,000 COLONIES/mL ESCHERICHIA COLI 20,000 COLONIES/mL KLEBSIELLA PNEUMONIAE    Report Status 03/03/2018 FINAL  Final   Organism ID, Bacteria ESCHERICHIA COLI (A)  Final   Organism ID, Bacteria KLEBSIELLA PNEUMONIAE (A)  Final      Susceptibility   Escherichia coli - MIC*    AMPICILLIN <=2 SENSITIVE Sensitive     CEFAZOLIN <=4 SENSITIVE Sensitive     CEFTRIAXONE <=1 SENSITIVE Sensitive     CIPROFLOXACIN <=0.25 SENSITIVE Sensitive     GENTAMICIN <=1 SENSITIVE Sensitive     IMIPENEM <=0.25 SENSITIVE Sensitive     NITROFURANTOIN <=16 SENSITIVE Sensitive     TRIMETH/SULFA <=20 SENSITIVE Sensitive     AMPICILLIN/SULBACTAM <=2 SENSITIVE Sensitive     PIP/TAZO <=4 SENSITIVE Sensitive     Extended ESBL NEGATIVE Sensitive     * 70,000 COLONIES/mL ESCHERICHIA COLI   Klebsiella pneumoniae - MIC*    AMPICILLIN RESISTANT Resistant     CEFAZOLIN <=4 SENSITIVE Sensitive     CEFTRIAXONE <=1 SENSITIVE Sensitive     CIPROFLOXACIN <=0.25 SENSITIVE Sensitive     GENTAMICIN <=1 SENSITIVE Sensitive     IMIPENEM <=0.25 SENSITIVE Sensitive     NITROFURANTOIN 32 SENSITIVE Sensitive     TRIMETH/SULFA <=20 SENSITIVE Sensitive     AMPICILLIN/SULBACTAM 4 SENSITIVE Sensitive     PIP/TAZO <=4 SENSITIVE Sensitive     Extended ESBL NEGATIVE Sensitive     * 20,000 COLONIES/mL KLEBSIELLA PNEUMONIAE   Creatinine: Recent Labs    03/04/18 1928  CREATININE 0.92   Baseline Creatinine: 0.9  Impression/Assessment:  85yo with left renal mass and adrenal metatasisis  Plan:  The  risks/benefits/alternatives to left robot assisted laparoscopic radical nephrectomy and left adrenalectomy was explained to the patient and she understands and wishes to proceed with surgery  Nicolette Bang 03/09/2018, 11:42 AM

## 2018-03-09 NOTE — Anesthesia Procedure Notes (Signed)
Arterial Line Insertion Start/End2/21/2020 12:13 PM, 03/09/2018 12:17 PM Performed by: Catalina Gravel, MD, anesthesiologist  Patient location: OR. Preanesthetic checklist: patient identified, IV checked, site marked, risks and benefits discussed, surgical consent, monitors and equipment checked, pre-op evaluation, timeout performed and anesthesia consent Lidocaine 1% used for infiltration Right, radial was placed Catheter size: 20 Fr Hand hygiene performed  and maximum sterile barriers used   Attempts: 3 Procedure performed without using ultrasound guided technique. Following insertion, dressing applied and Biopatch. Post procedure assessment: normal and unchanged  Patient tolerated the procedure well with no immediate complications.

## 2018-03-09 NOTE — Anesthesia Postprocedure Evaluation (Signed)
Anesthesia Post Note  Patient: [name]  Procedure(s) Performed: XI ROBOTIC ASSISTED LAPAROSCOPIC NEPHRECTOMY (Left )     Patient location during evaluation: PACU Anesthesia Type: General Level of consciousness: awake and alert Pain management: pain level controlled Vital Signs Assessment: post-procedure vital signs reviewed and stable Respiratory status: spontaneous breathing, nonlabored ventilation and respiratory function stable Cardiovascular status: blood pressure returned to baseline and stable Postop Assessment: no apparent nausea or vomiting Anesthetic complications: no    Last Vitals:  Vitals:   03/09/18 1630 03/09/18 1647  BP: (!) 168/88 (!) 168/87  Pulse: 78 76  Resp: (!) 21 17  Temp: (!) 35.8 C 36.9 C  SpO2: 98% 100%    Last Pain:  Vitals:   03/09/18 1647  TempSrc: Oral  PainSc:                  Laterra Lubinski A.

## 2018-03-09 NOTE — Op Note (Signed)
Preoperative diagnosis: Left renal mass  Postop diagnosis: Same  Procedure: 1.  Left robot assisted laparoscopic radical nephrectomy and adrenalectomy 2.  Lysis of adhesions, extensive 3. Intra-operative [place] of renal vein and IVC  Attending: Nicolette Bang, MD  Resident: Case Clydene Laming, MD  Anesthesia: General  Estimated blood loss: 300 cc  Drains: 16 French Foley catheter  Specimens: Left radical kidney and adrenal  Antibiotics: ancef  Findings: 1 renal artery and 1 renal vein. Renal vein thrombus extending to IVC but no involving IVC. Multiple omental metastasis and multiple mesenteric metastasis.  Indications: Patient is a 85 year old with a history of 8 cm left renal mass and 2cm adrenal metastasis. After discussing treatment options patient decided to proceed with left robot assisted laparoscopic radical nephrectomy.  Procedure in detail: Prior to procedure consent was obtained. Patient was brought to the operating room and briefing was done sure correct patient, correct procedure, correct site.  General anesthesia was in administered patient was placed in the right lateral decubitus position.  a 59 French catheter was placed. their abdomen and flank was then prepped and draped usual sterile fashion.  A Veress needle was used to obtain pneumoperitoneum.  Once pneumoperitoneum was reestablished to 15 mmHg we then placed a 8 mm camera port lateral to the umbilicus at the latera; edge of rectus.  We then proceeded to place 3 more robotic ports. We then placed an assistant port. We then docked the robot.  We then started this dissection by removing a extensive amount of anterior abdominal wall adhesions.  We then dissected along the white line of Toldt.  We then reflected the colon medially.  We then identified the psoas muscle.  Once this was done we traced it down to the iliac vessels and identified the ureter.  Once we identified the gonadal vein and ureter were then traced this to the  renal hilum.  The renal vein and renal artery were skeletonized.  We did we identified one renal vein one renal artery. We noted a full renal vein and used the robotic ultrasound and identified a thrombus extending to the IVC. We used a hem-o-lock clip at the level of the renal vein inseriton into the IVC. We ensured no thrombus was past the renal vein prior placing the clip   Using the [employer] power stapler within ligated the renal artery.  Once this was done we then used a second staple load to ligate the renal vein.  We then used a tissue load to ligate the gonadal vein and the ureter.  Once this was done we then freed the kidney from its lateral and posterior attachments.  We then used a Endo Catch bag to remove the specimen.  Once the specimen was in the Endo Catch bag we then inspected the retroperitoneum and noted no residual bleeding.  We then removed our instruments, undocked the robot, and released the pneumoperitoneum.  We then made a midline incision above the umbilicus to remove the specimen.  Once the specimen was removed we then closed the camera and assistant ports with 0 Vicryl in interrupted fashion.  We then closed the midline incision with looped PDS in a running fashion.  We then closed the overlying skin with 2-0 Vicryl in running fashion.  These skin was then subcuticularly closed with 4-0 Monocryl.  We then placed Dermabond over all the incisions.  This included the procedure which resulted by the patient.  Complications: None  Condition: Stable, x-rayed, transferred to PACU.  Plan: Patient is  to be admitted for inpatient stay. The foley catheter will be removed in the morning. They will be started on a clear liquid diet POD#1

## 2018-03-10 LAB — BASIC METABOLIC PANEL
Anion gap: 6 (ref 5–15)
BUN: 22 mg/dL (ref 8–23)
CO2: 26 mmol/L (ref 22–32)
Calcium: 7.4 mg/dL — ABNORMAL LOW (ref 8.9–10.3)
Chloride: 99 mmol/L (ref 98–111)
Creatinine, Ser: 1.74 mg/dL — ABNORMAL HIGH (ref 0.44–1.00)
GFR calc Af Amer: 31 mL/min — ABNORMAL LOW (ref >60)
GFR calc non Af Amer: 27 mL/min — ABNORMAL LOW (ref >60)
Glucose, Bld: 206 mg/dL — ABNORMAL HIGH (ref 70–99)
Potassium: 5.1 mmol/L (ref 3.5–5.1)
Sodium: 131 mmol/L — ABNORMAL LOW (ref 135–145)

## 2018-03-10 LAB — HEMOGLOBIN AND HEMATOCRIT, BLOOD
HCT: 31.1 % — ABNORMAL LOW (ref 36.0–46.0)
HEMATOCRIT: 23.7 % — AB (ref 36.0–46.0)
Hemoglobin: 7.3 g/dL — ABNORMAL LOW (ref 12.0–15.0)
Hemoglobin: 9.7 g/dL — ABNORMAL LOW (ref 12.0–15.0)

## 2018-03-10 LAB — PREPARE RBC (CROSSMATCH)

## 2018-03-10 MED ORDER — BISACODYL 10 MG RE SUPP
10.0000 mg | Freq: Once | RECTAL | Status: AC
  Administered 2018-03-10: 10 mg via RECTAL
  Filled 2018-03-10: qty 1

## 2018-03-10 MED ORDER — SODIUM CHLORIDE 0.9 % IV BOLUS
500.0000 mL | Freq: Once | INTRAVENOUS | Status: AC
  Administered 2018-03-10: 500 mL via INTRAVENOUS

## 2018-03-10 MED ORDER — SODIUM CHLORIDE 0.9% IV SOLUTION
Freq: Once | INTRAVENOUS | Status: AC
  Administered 2018-03-10: 04:00:00 via INTRAVENOUS

## 2018-03-10 NOTE — Progress Notes (Signed)
Pt's BP reading as low as 78/50 this shift. On call urologist, Dr.Borden, notified via call. Pt given 500cc bolus and BP rechecked upon completion. BP 86/45 and pt complains of dizziness when head lowered. On call urologist updated and orders received. Another 500cc bolus given, and H&H checked. Pt now comfortable and vitals stable. Will continue to monitor.

## 2018-03-10 NOTE — Progress Notes (Addendum)
Patient ID: [name], female   DOB: 30-Jan-1932, 85 y.o.   MRN: [id number]  1 Day Post-Op Subjective: Pt with low BP last night requiring 1 L of fluid bolus overnight.  Hgb was 11 post op post intra-op transfusion.  Recheck Hgb last night was 7.3 indicating probable acute blood loss as cause for hypotension.  2 units of PRBC transfusion ordered.  Not yet received.  BP currently normal but low normal.  She denies any significant pain. No nausea or vomiting.  Objective: Vital signs in last 24 hours: Temp:  [96.5 F (35.8 C)-98.5 F (36.9 C)] 97.8 F (36.6 C) (02/22 0544) Pulse Rate:  [73-99] 89 (02/22 0544) Resp:  [16-23] 16 (02/22 0544) BP: (78-178)/(45-103) 93/51 (02/22 0544) SpO2:  [96 %-100 %] 100 % (02/22 0544) Arterial Line BP: (130)/(109) 130/109 (02/21 1545) Weight:  [55.2 kg-56.7 kg] 56.7 kg (02/21 1647)  Intake/Output from previous day: 02/21 0701 - 02/22 0700 In: 2556 [P.O.:100; I.V.:1675; Blood:781] Out: 885 [Urine:410; Drains:175; Blood:300] Intake/Output this shift: No intake/output data recorded.  Physical Exam:  General: Alert and oriented CV: RRR Lungs: Clear Abdomen: Soft, ND, no bowel sounds.  Appropriately tender. Drain with serosanguinous drainage. Incisions: C/D/I Ext: NT, No erythema  Lab Results: Recent Labs    03/09/18 1350 03/09/18 1601 03/10/18 0225  HGB 8.2* 11.0* 7.3*  HCT 24.0* 34.5* 23.7*   BMET Recent Labs    03/09/18 1601 03/10/18 0225  NA 130* 131*  K 4.5 5.1  CL 96* 99  CO2 26 26  GLUCOSE 211* 206*  BUN 16 22  CREATININE 0.90 1.74*  CALCIUM 7.9* 7.4*     Studies/Results: Path pending   Assessment/Plan: POD # 1 s/p cytoreductive nephrectomy in setting of metastatic renal cell carcinoma due to symptoms from primary tumor 1) CV: Hypotension is improved with IVF boluses overnight.  Plan to transfuse 2 units PRBCs today due to drop in Hgb with hypotension.  Re-check H/H after transfusion.  If any problems today with  hemodynamic parameters, will have low threshold to transfer to stepdown ICU. Hold IV narcotics. 2) Renal: Cr increased as expected.  Continue to follow. 3) DVT prophylaxis: Continue mechanical DVT prophylaxis but will hold pharmacologic prophylaxis due to concerns of drop in Hgb. 4) Post-op: Will have PT see once HD stable to assist with ambulation. 5) GI: Continue clears right now and will consider advancing diet later today or tomorrow.   LOS: 1 day   Dutch Gray 03/10/2018, 7:44 AM

## 2018-03-11 LAB — BASIC METABOLIC PANEL
Anion gap: 8 (ref 5–15)
BUN: 21 mg/dL (ref 8–23)
CO2: 20 mmol/L — ABNORMAL LOW (ref 22–32)
Calcium: 7.2 mg/dL — ABNORMAL LOW (ref 8.9–10.3)
Chloride: 104 mmol/L (ref 98–111)
Creatinine, Ser: 1.26 mg/dL — ABNORMAL HIGH (ref 0.44–1.00)
GFR calc Af Amer: 46 mL/min — ABNORMAL LOW (ref >60)
GFR calc non Af Amer: 39 mL/min — ABNORMAL LOW (ref >60)
GLUCOSE: 231 mg/dL — AB (ref 70–99)
Potassium: 4.5 mmol/L (ref 3.5–5.1)
Sodium: 132 mmol/L — ABNORMAL LOW (ref 135–145)

## 2018-03-11 LAB — HEMOGLOBIN AND HEMATOCRIT, BLOOD
HCT: 30.9 % — ABNORMAL LOW (ref 36.0–46.0)
HEMOGLOBIN: 9.7 g/dL — AB (ref 12.0–15.0)

## 2018-03-11 MED ORDER — FUROSEMIDE 10 MG/ML IJ SOLN
20.0000 mg | Freq: Once | INTRAMUSCULAR | Status: AC
  Administered 2018-03-11: 20 mg via INTRAVENOUS
  Filled 2018-03-11: qty 2

## 2018-03-11 NOTE — Progress Notes (Signed)
Pt tolerating clears well this shift and reports no pain. Pt's BP back to baseline and other vitals stable. Pt still saturating dressing at JP drain site. Dr. Alinda Money made aware and reinforcement of dressing encouraged. Will continue to monitor.

## 2018-03-11 NOTE — Progress Notes (Signed)
Foley removed at 1500. Patient tolerated well. Patient ambulated to bathroom with 1 assist and walker and had flatus and passed small bowel movement. She then ambulated to chair and sat in chair for the remainder of the shift. Pt still DTV, will report off to oncoming RN.  Barbee Shropshire. Brigitte Pulse, RN

## 2018-03-11 NOTE — Progress Notes (Signed)
Initial Nutrition Assessment  DOCUMENTATION CODES:   Severe malnutrition in context of chronic illness  INTERVENTION:   Continue Boost Breeze po TID, each supplement provides 250 kcal and 9 grams of protein  NUTRITION DIAGNOSIS:   Severe Malnutrition related to chronic illness( left renal mass and adrenal metatasisis) as evidenced by severe fat depletion, severe muscle depletion, percent weight loss.  GOAL:   Patient will meet greater than or equal to 90% of their needs  MONITOR:   PO intake, Supplement acceptance, Labs, Weight trends, I & O's  REASON FOR ASSESSMENT:   Malnutrition Screening Tool    ASSESSMENT:   85yo with a hx of gross hematuria who was found to have a large left renal mass with a ipsalateral adrenal metastasis.  2/21: s/p  Left robot assisted laparoscopic radical nephrectomy and adrenalectomy  Patient's diet now full liquid. Pt follows a pureed/full liquid diet at home. Pt has been ordered Boost Breeze supplements and she is drinking them.   Pt reports 40 lb of weight loss. Per weight records from [facility] care, pt has lost 21 lb since 1/8 (15% wt loss x 2 months, significant for time frame).   Medications: Ferrous sulfate BID, Vitamin B-12 tablet daily, Vitamin C tablet daily, Vitamin E capsule daily Labs reviewed:  Low Na  NUTRITION - FOCUSED PHYSICAL EXAM:    Most Recent Value  Orbital Region  Severe depletion  Upper Arm Region  Mild depletion  Thoracic and Lumbar Region  Unable to assess  Buccal Region  Severe depletion  Temple Region  Severe depletion  Clavicle Bone Region  Severe depletion  Clavicle and Acromion Bone Region  Severe depletion  Scapular Bone Region  Unable to assess  Dorsal Hand  Severe depletion  Patellar Region  Unable to assess  Anterior Thigh Region  Unable to assess  Posterior Calf Region  Unable to assess  Edema (RD Assessment)  None       Diet Order:   Diet Order            Diet full liquid Room service  appropriate? Yes; Fluid consistency: Thin  Diet effective now              EDUCATION NEEDS:   No education needs have been identified at this time  Skin:  Skin Assessment: Reviewed RN Assessment  Last BM:  2/23  Height:   Ht Readings from Last 1 Encounters:  03/09/18 5\' 5"  (1.651 m)    Weight:   Wt Readings from Last 1 Encounters:  03/09/18 56.7 kg    Ideal Body Weight:  56.8 kg  BMI:  Body mass index is 20.8 kg/m.  Estimated Nutritional Needs:   Kcal:  1400-1600  Protein:  65-75g  Fluid:  1.6L/day   Clayton Bibles, MS, RD, LDN [place] Dietitian Pager: [phone] After Hours Pager: [phone]

## 2018-03-11 NOTE — Progress Notes (Signed)
Patient ID: [name], female   DOB: 15-Jun-1932, 85 y.o.   MRN: [id number]  2 Days Post-Op Subjective: Pt doing better.  Some confusion this morning but now improved.  Hgb increased appropriately after 2 units PRBCs yesterday.  BP now stable.  UOP has been somewhat low.  Objective: Vital signs in last 24 hours: Temp:  [97.3 F (36.3 C)-99.1 F (37.3 C)] 99.1 F (37.3 C) (02/23 0513) Pulse Rate:  [91-108] 108 (02/23 0513) Resp:  [14-20] 16 (02/23 0513) BP: (94-155)/(55-85) 155/85 (02/23 0513) SpO2:  [90 %-100 %] 90 % (02/23 0513)  Intake/Output from previous day: 02/22 0701 - 02/23 0700 In: 3418.4 [P.O.:555; I.V.:2238.4; Blood:625] Out: 321 [Urine:225; Drains:95; Stool:1] Intake/Output this shift: Total I/O In: -  Out: 275 [Urine:225; Drains:50]  Physical Exam:  General: Alert and oriented CV: RRR, bilateral lower extremity edema Lungs: Bilateral basilar crackles Abdomen: Soft, ND, some bowel sounds Incisions: C/D/I Ext: NT, No erythema  Lab Results: Recent Labs    03/10/18 0225 03/10/18 2049 03/11/18 0523  HGB 7.3* 9.7* 9.7*  HCT 23.7* 31.1* 30.9*   CBC Latest Ref Rng & Units 03/11/2018 03/10/2018 03/10/2018  WBC 4.0 - 10.5 K/uL - - -  Hemoglobin 12.0 - 15.0 g/dL 9.7(L) 9.7(L) 7.3(L)  Hematocrit 36.0 - 46.0 % 30.9(L) 31.1(L) 23.7(L)  Platelets 150 - 400 K/uL - - -     BMET Recent Labs    03/10/18 0225 03/11/18 0523  NA 131* 132*  K 5.1 4.5  CL 99 104  CO2 26 20*  GLUCOSE 206* 231*  BUN 22 21  CREATININE 1.74* 1.26*  CALCIUM 7.4* 7.2*     Studies/Results:   Assessment/Plan: POD # 2 s/p left cytoreductive radical nephrectomy - Advance diet - Begin ambulation, IS, will consult PT - Will diurese a little this morning considering signs of fluid overload and low UOP - Oral pain medication - Will d/c catheter after diuresis   LOS: 2 days   Dutch Gray 03/11/2018, 9:12 AM

## 2018-03-12 ENCOUNTER — Encounter (HOSPITAL_COMMUNITY): Payer: Self-pay | Admitting: Urology

## 2018-03-12 DIAGNOSIS — E43 Unspecified severe protein-calorie malnutrition: Secondary | ICD-10-CM

## 2018-03-12 LAB — BASIC METABOLIC PANEL
Anion gap: 7 (ref 5–15)
BUN: 13 mg/dL (ref 8–23)
CO2: 27 mmol/L (ref 22–32)
Calcium: 8 mg/dL — ABNORMAL LOW (ref 8.9–10.3)
Chloride: 99 mmol/L (ref 98–111)
Creatinine, Ser: 0.96 mg/dL (ref 0.44–1.00)
GFR calc Af Amer: >60 mL/min (ref >60)
GFR, EST NON AFRICAN AMERICAN: 55 mL/min — AB (ref >60)
Glucose, Bld: 177 mg/dL — ABNORMAL HIGH (ref 70–99)
Potassium: 4.5 mmol/L (ref 3.5–5.1)
SODIUM: 133 mmol/L — AB (ref 135–145)

## 2018-03-12 LAB — GLUCOSE, CAPILLARY
Glucose-Capillary: 200 mg/dL — ABNORMAL HIGH (ref 70–99)
Glucose-Capillary: 232 mg/dL — ABNORMAL HIGH (ref 70–99)
Glucose-Capillary: 88 mg/dL (ref 70–99)
Glucose-Capillary: 201 mg/dL — ABNORMAL HIGH (ref 70–99)

## 2018-03-12 LAB — HEMOGLOBIN AND HEMATOCRIT, BLOOD
HCT: 32.8 % — ABNORMAL LOW (ref 36.0–46.0)
HEMOGLOBIN: 10.1 g/dL — AB (ref 12.0–15.0)

## 2018-03-12 MED ORDER — INSULIN ASPART 100 UNIT/ML ~~LOC~~ SOLN
0.0000 [IU] | Freq: Three times a day (TID) | SUBCUTANEOUS | Status: DC
  Administered 2018-03-12: 5 [IU] via SUBCUTANEOUS
  Administered 2018-03-13: 3 [IU] via SUBCUTANEOUS
  Administered 2018-03-13: 5 [IU] via SUBCUTANEOUS
  Administered 2018-03-13 – 2018-03-14 (×2): 2 [IU] via SUBCUTANEOUS
  Administered 2018-03-14: 3 [IU] via SUBCUTANEOUS
  Administered 2018-03-14 – 2018-03-15 (×3): 2 [IU] via SUBCUTANEOUS

## 2018-03-12 NOTE — Progress Notes (Signed)
Patient ID: [name], female   DOB: 1932-02-18, 86 y.o.   MRN: [id number]  3 Days Post-Op Subjective: Patient without complaint this morning. No incisional pain. Hgb 10.1 creatinine 0.96. 2.5L urine output. 365 cc drain output.   Objective: Vital signs in last 24 hours: Temp:  [98.1 F (36.7 C)-98.4 F (36.9 C)] 98.1 F (36.7 C) (02/24 0605) Pulse Rate:  [100-105] 100 (02/24 0605) Resp:  [16-20] 17 (02/24 0605) BP: (158-169)/(86-93) 169/93 (02/24 0605) SpO2:  [94 %] 94 % (02/24 0605)  Intake/Output from previous day: 02/23 0701 - 02/24 0700 In: 120 [P.O.:120] Out: 3320 [Urine:2925; Drains:395] Intake/Output this shift: No intake/output data recorded.  Physical Exam:  General: Alert and oriented CV: RRR, bilateral lower extremity edema Lungs: Bilateral basilar crackles Abdomen: Soft, ND, some bowel sounds Incisions: C/D/I Ext: NT, No erythema  Lab Results: Recent Labs    03/10/18 2049 03/11/18 0523 03/12/18 0621  HGB 9.7* 9.7* 10.1*  HCT 31.1* 30.9* 32.8*   CBC Latest Ref Rng & Units 03/12/2018 03/11/2018 03/10/2018  WBC 4.0 - 10.5 K/uL - - -  Hemoglobin 12.0 - 15.0 g/dL 10.1(L) 9.7(L) 9.7(L)  Hematocrit 36.0 - 46.0 % 32.8(L) 30.9(L) 31.1(L)  Platelets 150 - 400 K/uL - - -     BMET Recent Labs    03/11/18 0523 03/12/18 0621  NA 132* 133*  K 4.5 4.5  CL 104 99  CO2 20* 27  GLUCOSE 231* 177*  BUN 21 13  CREATININE 1.26* 0.96  CALCIUM 7.2* 8.0*     Studies/Results:   Assessment/Plan: POD # 3 s/p left cytoreductive radical nephrectomy - regular diet - Await PT eval    LOS: 3 days   Nicolette Bang 03/12/2018, 10:10 AM

## 2018-03-12 NOTE — Evaluation (Signed)
Physical Therapy Evaluation Patient Details Name: [name] MRN: [id number] DOB: 06/23/1932 Today's Date: 03/12/2018   History of Present Illness  85yo with PMH of PVD, fibromyalgia, colon cancer, had gross hematuria and was found to have a large left renal mass with an ipsalateral adrenal metastasis. s/p radical nephrectomy 03/09/18.  Clinical Impression  Pt admitted with above diagnosis. Pt currently with functional limitations due to the deficits listed below (see PT Problem List). Pt ambulated 160' with RW with min A to negotiate obstacles x 2, no loss of balance. Good progress expected.  Pt will benefit from skilled PT to increase their independence and safety with mobility to allow discharge to the venue listed below.       Follow Up Recommendations No PT follow up    Equipment Recommendations  None recommended by PT    Recommendations for Other Services       Precautions / Restrictions Precautions Precautions: Fall Precaution Comments: no falls in past 1 year, JP drain  Restrictions Weight Bearing Restrictions: No      Mobility  Bed Mobility Overal bed mobility: Needs Assistance Bed Mobility: Rolling;Sidelying to Sit Rolling: Supervision Sidelying to sit: Supervision       General bed mobility comments: VCs for log rolling technique  Transfers Overall transfer level: Needs assistance Equipment used: Rolling walker (2 wheeled) Transfers: Sit to/from Stand Sit to Stand: Supervision         General transfer comment: VCs hand placement; pt stood x ~10 minutes for dressing change  Ambulation/Gait Ambulation/Gait assistance: Min assist Gait Distance (Feet): 160 Feet Assistive device: Rolling walker (2 wheeled) Gait Pattern/deviations: Decreased stride length Gait velocity: decr   General Gait Details: steady with RW, min A to negotiate obstacles x 2, no loss of balance  Stairs            Wheelchair Mobility    Modified Rankin (Stroke  Patients Only)       Balance Overall balance assessment: Modified Independent                                           Pertinent Vitals/Pain Pain Assessment: No/denies pain    Home Living Family/patient expects to be discharged to:: Private residence Living Arrangements: Children Available Help at Discharge: Family Type of Home: Mobile home Home Access: Ramped entrance     Home Layout: One level Home Equipment: Environmental consultant - 4 wheels;Cane - single point;Bedside commode      Prior Function Level of Independence: Independent with assistive device(s)         Comments: uses rollator, bathes and dresses independently     Hand Dominance        Extremity/Trunk Assessment   Upper Extremity Assessment Upper Extremity Assessment: Overall WFL for tasks assessed    Lower Extremity Assessment Lower Extremity Assessment: Overall WFL for tasks assessed    Cervical / Trunk Assessment Cervical / Trunk Assessment: Normal  Communication   Communication: No difficulties  Cognition Arousal/Alertness: Awake/alert Behavior During Therapy: WFL for tasks assessed/performed Overall Cognitive Status: Within Functional Limits for tasks assessed                                        General Comments      Exercises     Assessment/Plan  PT Assessment Patient needs continued PT services  PT Problem List Decreased activity tolerance;Decreased mobility       PT Treatment Interventions DME instruction;Gait training;Therapeutic activities;Patient/family education    PT Goals (Current goals can be found in the Care Plan section)  Acute Rehab PT Goals Patient Stated Goal: cleaning home PT Goal Formulation: With patient Time For Goal Achievement: 03/26/18 Potential to Achieve Goals: Good    Frequency Min 3X/week   Barriers to discharge        Co-evaluation               AM-PAC PT "6 Clicks" Mobility  Outcome Measure Help needed  turning from your back to your side while in a flat bed without using bedrails?: None Help needed moving from lying on your back to sitting on the side of a flat bed without using bedrails?: None Help needed moving to and from a bed to a chair (including a wheelchair)?: A Little Help needed standing up from a chair using your arms (e.g., wheelchair or bedside chair)?: None Help needed to walk in hospital room?: A Little Help needed climbing 3-5 steps with a railing? : A Little 6 Click Score: 21    End of Session Equipment Utilized During Treatment: Gait belt Activity Tolerance: Patient tolerated treatment well Patient left: in chair;with call bell/phone within reach;with family/visitor present Nurse Communication: Mobility status PT Visit Diagnosis: Difficulty in walking, not elsewhere classified (R26.2)    Time: 2336-1224 PT Time Calculation (min) (ACUTE ONLY): 44 min   Charges:   PT Evaluation $PT Eval Low Complexity: 1 Low PT Treatments $Gait Training: 8-22 mins $Therapeutic Activity: 8-22 mins       Blondell Reveal Kistler PT 03/12/2018  Acute Rehabilitation Services Pager [phone] Office [phone]

## 2018-03-12 NOTE — Care Management Important Message (Signed)
Important Message  Patient Details  Name: [name] MRN: [id number] Date of Birth: 07/03/1932   Medicare Important Message Given:  Yes    Kerin Salen 03/12/2018, 12:21 [place] Message  Patient Details  Name: [name] MRN: [id number] Date of Birth: 12-13-1932   Medicare Important Message Given:  Yes    Kerin Salen 03/12/2018, 12:21 PM

## 2018-03-13 LAB — TYPE AND SCREEN
ABO/RH(D): A NEG
ANTIBODY SCREEN: NEGATIVE
UNIT DIVISION: 0
Unit division: 0
Unit division: 0
Unit division: 0
Unit division: 0

## 2018-03-13 LAB — BPAM RBC
Blood Product Expiration Date: 202003182359
Blood Product Expiration Date: 202003182359
Blood Product Expiration Date: 202003182359
Blood Product Expiration Date: 202003252359
Blood Product Expiration Date: 202003252359
ISSUE DATE / TIME: 202002211243
ISSUE DATE / TIME: 202002211243
ISSUE DATE / TIME: 202002211410
ISSUE DATE / TIME: 202002221146
ISSUE DATE / TIME: 202002221542
UNIT TYPE AND RH: 600
Unit Type and Rh: 600
Unit Type and Rh: 600
Unit Type and Rh: 600
Unit Type and Rh: 600

## 2018-03-13 LAB — GLUCOSE, CAPILLARY
GLUCOSE-CAPILLARY: 208 mg/dL — AB (ref 70–99)
Glucose-Capillary: 138 mg/dL — ABNORMAL HIGH (ref 70–99)
Glucose-Capillary: 178 mg/dL — ABNORMAL HIGH (ref 70–99)
Glucose-Capillary: 192 mg/dL — ABNORMAL HIGH (ref 70–99)

## 2018-03-13 NOTE — Progress Notes (Signed)
Patient ID: NGUYET MERCER, female   DOB: Feb 13, 1932, 85 y.o.   MRN: [id number]  4 Days Post-Op Subjective: Patient without complaint this morning. No incisional pain. Patient evaluated by PT yesterday and patient would benefits from rehab Objective: Vital signs in last 24 hours: Temp:  [98.1 F (36.7 C)-98.7 F (37.1 C)] 98.1 F (36.7 C) (02/25 2148) Pulse Rate:  [102-107] 107 (02/25 2148) Resp:  [17-18] 18 (02/25 2148) BP: (146-151)/(75-92) 151/75 (02/25 2148) SpO2:  [95 %] 95 % (02/25 2148)  Intake/Output from previous day: 02/24 0701 - 02/25 0700 In: 360 [P.O.:360] Out: 990 [Urine:900; Drains:90] Intake/Output this shift: No intake/output data recorded.  Physical Exam:  General: Alert and oriented CV: RRR, bilateral lower extremity edema Lungs: Bilateral basilar crackles Abdomen: Soft, ND, some bowel sounds Incisions: C/D/I Ext: NT, No erythema  Lab Results: Recent Labs    03/11/18 0523 03/12/18 0621  HGB 9.7* 10.1*  HCT 30.9* 32.8*   CBC Latest Ref Rng & Units 03/12/2018 03/11/2018 03/10/2018  WBC 4.0 - 10.5 K/uL - - -  Hemoglobin 12.0 - 15.0 g/dL 10.1(L) 9.7(L) 9.7(L)  Hematocrit 36.0 - 46.0 % 32.8(L) 30.9(L) 31.1(L)  Platelets 150 - 400 K/uL - - -     BMET Recent Labs    03/11/18 0523 03/12/18 0621  NA 132* 133*  K 4.5 4.5  CL 104 99  CO2 20* 27  GLUCOSE 231* 177*  BUN 21 13  CREATININE 1.26* 0.96  CALCIUM 7.2* 8.0*     Studies/Results:   Assessment/Plan: POD # 4 s/p left cytoreductive radical nephrectomy - regular diet - We will get patient set up for transfer to SNF    LOS: 4 days   Nicolette Bang 03/13/2018, 9:52 PM

## 2018-03-14 LAB — GLUCOSE, CAPILLARY
Glucose-Capillary: 175 mg/dL — ABNORMAL HIGH (ref 70–99)
Glucose-Capillary: 134 mg/dL — ABNORMAL HIGH (ref 70–99)
Glucose-Capillary: 138 mg/dL — ABNORMAL HIGH (ref 70–99)
Glucose-Capillary: 115 mg/dL — ABNORMAL HIGH (ref 70–99)

## 2018-03-14 MED ORDER — GLUCERNA SHAKE PO LIQD
237.0000 mL | Freq: Three times a day (TID) | ORAL | Status: DC
  Administered 2018-03-14 – 2018-03-15 (×4): 237 mL via ORAL
  Filled 2018-03-14 (×5): qty 237

## 2018-03-14 NOTE — Progress Notes (Signed)
Physical Therapy Treatment Patient Details Name: [name] MRN: [id number] DOB: 23-Mar-1932 Today's Date: 03/14/2018    History of Present Illness 85yo with PMH of PVD, fibromyalgia, colon cancer, had gross hematuria and was found to have a large left renal mass with an ipsalateral adrenal metastasis. s/p radical nephrectomy 03/09/18.    PT Comments    Pt reports not feeling as well today however tolerated ambulation in hallway with RW well.  Pt steady with RW and able to ambulate good distance.  No f/u PT recommended at this time.  Follow Up Recommendations  No PT follow up     Equipment Recommendations  None recommended by PT    Recommendations for Other Services       Precautions / Restrictions Precautions Precautions: Fall    Mobility  Bed Mobility Overal bed mobility: Needs Assistance Bed Mobility: Rolling;Sidelying to Sit Rolling: Supervision Sidelying to sit: Supervision          Transfers Overall transfer level: Needs assistance Equipment used: Rolling walker (2 wheeled) Transfers: Sit to/from Stand Sit to Stand: Supervision         General transfer comment: utilizes UE assist with sit to stands  Ambulation/Gait Ambulation/Gait assistance: Min Gaffer (Feet): 320 Feet Assistive device: Rolling walker (2 wheeled) Gait Pattern/deviations: Step-through pattern;Decreased stride length     General Gait Details: steady with RW   Stairs             Wheelchair Mobility    Modified Rankin (Stroke Patients Only)       Balance                                            Cognition Arousal/Alertness: Awake/alert Behavior During Therapy: WFL for tasks assessed/performed Overall Cognitive Status: Within Functional Limits for tasks assessed                                        Exercises      General Comments        Pertinent Vitals/Pain Pain Assessment: No/denies pain     Home Living                      Prior Function            PT Goals (current goals can now be found in the care plan section) Progress towards PT goals: Progressing toward goals    Frequency    Min 3X/week      PT Plan Current plan remains appropriate    Co-evaluation              AM-PAC PT "6 Clicks" Mobility   Outcome Measure  Help needed turning from your back to your side while in a flat bed without using bedrails?: None Help needed moving from lying on your back to sitting on the side of a flat bed without using bedrails?: None Help needed moving to and from a bed to a chair (including a wheelchair)?: A Little Help needed standing up from a chair using your arms (e.g., wheelchair or bedside chair)?: A Little Help needed to walk in hospital room?: A Little Help needed climbing 3-5 steps with a railing? : A Little 6 Click Score: 20    End of Session  Equipment Utilized During Treatment: Gait belt Activity Tolerance: Patient tolerated treatment well Patient left: in chair;with call bell/phone within reach Nurse Communication: Mobility status PT Visit Diagnosis: Difficulty in walking, not elsewhere classified (R26.2)     Time: 2820-6015 PT Time Calculation (min) (ACUTE ONLY): 18 min  Charges:  $Gait Training: 8-22 mins                     Carmelia Bake, PT, DPT Acute Rehabilitation Services Office: [phone] Pager: [phone]  Trena Platt 03/14/2018, 1:12 PM

## 2018-03-14 NOTE — Progress Notes (Signed)
Patient ID: [name], female   DOB: 1932-11-23, 85 y.o.   MRN: [id number]  5 Days Post-Op Subjective: Patient had diarrhea overnight. No incisional pain. Patient evaluated by PT yesterday and today patient does not need any PTR needs Objective: Vital signs in last 24 hours: Temp:  [98.1 F (36.7 C)-98.3 F (36.8 C)] 98.2 F (36.8 C) (02/26 1339) Pulse Rate:  [104-107] 104 (02/26 1339) Resp:  [18-22] 18 (02/26 1339) BP: (151-166)/(75-98) 164/92 (02/26 1339) SpO2:  [95 %-98 %] 96 % (02/26 1339)  Intake/Output from previous day: 02/25 0701 - 02/26 0700 In: 480 [P.O.:480] Out: 1860 [Urine:1800; Drains:60] Intake/Output this shift: No intake/output data recorded.  Physical Exam:  General: Alert and oriented CV: RRR, bilateral lower extremity edema Lungs: Bilateral basilar crackles Abdomen: Soft, ND, some bowel sounds Incisions: C/D/I Ext: NT, No erythema  Lab Results: Recent Labs    03/12/18 0621  HGB 10.1*  HCT 32.8*   CBC Latest Ref Rng & Units 03/12/2018 03/11/2018 03/10/2018  WBC 4.0 - 10.5 K/uL - - -  Hemoglobin 12.0 - 15.0 g/dL 10.1(L) 9.7(L) 9.7(L)  Hematocrit 36.0 - 46.0 % 32.8(L) 30.9(L) 31.1(L)  Platelets 150 - 400 K/uL - - -     BMET Recent Labs    03/12/18 0621  NA 133*  K 4.5  CL 99  CO2 27  GLUCOSE 177*  BUN 13  CREATININE 0.96  CALCIUM 8.0*     Studies/Results:   Assessment/Plan: POD # 4 s/p left cytoreductive radical nephrectomy - regular diet - likely discharge tomorrow    LOS: 5 days   Nicolette Bang 03/14/2018, 7:31 PM

## 2018-03-15 LAB — GLUCOSE, CAPILLARY
GLUCOSE-CAPILLARY: 139 mg/dL — AB (ref 70–99)
Glucose-Capillary: 143 mg/dL — ABNORMAL HIGH (ref 70–99)

## 2018-03-15 MED ORDER — TRAMADOL HCL 50 MG PO TABS: 50.0000 mg | ORAL_TABLET | Freq: Four times a day (QID) | ORAL | 0 refills | Status: DC | PRN

## 2018-03-15 NOTE — Care Management Important Message (Signed)
Important Message  Patient Details  Name: [name] MRN: [id number] Date of Birth: January 29, 1932   Medicare Important Message Given:  Yes    Kerin Salen 03/15/2018, 12:07 [place] Message  Patient Details  Name: [name] MRN: [id number] Date of Birth: Jun 29, 1932   Medicare Important Message Given:  Yes    Kerin Salen 03/15/2018, 12:07 PM

## 2018-03-15 NOTE — Discharge Instructions (Signed)
Laparoscopic Nephrectomy, Care After Refer to this sheet in the next few weeks. These instructions provide you with information on caring for yourself after your procedure. Your health care provider may also give you more specific instructions. Your treatment has been planned according to current medical practices, but problems sometimes occur. Call your health care provider if you have any problems or questions after your procedure. What can I expect after the procedure? After the procedure, it is common to have:  Pain.  Soreness and numbness in your incision areas. Follow these instructions at home: Activity  Return to your normal activities as told by your health care provider. Ask what activities are safe for you.  Do not lift anything that is heavier than 10 lb (4.5 kg) until your health care provider approves. Bathing  Do not take baths, swim, or use a hot tub until your health care provider approves. Ask your health care provider if you can take showers. You may only be allowed to take sponge baths for bathing.  Keep the bandage (dressing) dry until your health care provider says it can be removed. Incision care   Follow instructions from your health care provider about how to take care of your incisions. Make sure you: ? Wash your hands with soap and water before you change your dressing. If soap and water are not available, use hand sanitizer. ? Change your dressing as told by your health care provider. ? Leave stitches (sutures), skin glue, or adhesive strips in place. These skin closures may need to be in place for 2 weeks or longer. If adhesive strip edges start to loosen and curl up, you may trim the loose edges. Do not remove adhesive strips completely unless your health care provider tells you to do that.  Check your incision area every day for signs of infection. Watch for: ? Redness, swelling, or pain. ? Fluid, blood, or pus. General instructions  Take medicines only  as directed by your health care provider.  Do not drive or operate heavy machinery while taking prescription pain medicine.  Keep all follow-up visits as told by your health care provider. This is important.  Drink enough fluid to keep your urine clear or pale yellow.  If you have a drain, follow instructions from your health care provider about how to care for it. Contact a health care provider if:  Your pain is worse.  You have redness, swelling, or pain at your incision site.  You have a bad smell coming from the wound or dressing. Get help right away if:  You have a rash.  You have trouble breathing or feel short of breath.  You feel light-headed or dizzy.  You have blood in your urine.  You have fluid, blood, or pus coming from your incision.  You have a fever. This information is not intended to replace advice given to you by your health care provider. Make sure you discuss any questions you have with your health care provider. Document Released: 09/24/2014 Document Revised: 09/11/2017 Document Reviewed: 05/28/2014 Elsevier Interactive Patient Education  [employer].

## 2018-03-19 NOTE — Discharge Summary (Signed)
Physician Discharge Summary  Patient ID: [name] MRN: [id number] DOB/AGE: [birth year] 83 y.o.  Admit date: 03/09/2018 Discharge date: 03/15/2018  Admission Diagnoses: Renal mass Discharge Diagnoses:  Active Problems:   Renal mass   Protein-calorie malnutrition, severe   Discharged Condition: good  Hospital Course: The patient tolerated the procedure well and was transferred to the floor on IV pain meds, IV fluid. On POD#1 foley was removed, pt was started on clear liquid diet and they ambulated in the halls. On POD#2 the patient was transitioned to a full liquid diet, IVFs were discontinued, and the patient passed flatus. She was evaluated by PT and PT recommended her to be discharge home without special precautions. Prior to discharge the pt was tolerating a regular diet, pain was controlled on PO pain meds, they were ambulating without difficulty, and they had normal bowel function.   Consults: None  Significant Diagnostic Studies: none  Treatments: surgery: robot assisted laparoscopic left radical nephrectomy  Discharge Exam: Blood pressure (!) 143/81, pulse (!) 105, temperature 98.3 F (36.8 C), temperature source Oral, resp. rate 20, height 5\' 5"  (1.651 m), weight 56.7 kg, last menstrual period 01/17/1994, SpO2 96 %. General appearance: alert, cooperative and appears stated age Head: Normocephalic, without obvious abnormality, atraumatic Nose: Nares normal. Septum midline. Mucosa normal. No drainage or sinus tenderness. Neck: no adenopathy, no carotid bruit, no JVD, supple, symmetrical, trachea midline and thyroid not enlarged, symmetric, no tenderness/mass/nodules Resp: clear to auscultation bilaterally Cardio: regular rate and rhythm, S1, S2 normal, no murmur, click, rub or gallop GI: soft, non-tender; bowel sounds normal; no masses,  no organomegaly Extremities: extremities normal, atraumatic, no cyanosis or edema Neurologic: Grossly normal Incision/Wound: clean,  dry, intact  Disposition:    Allergies as of 03/15/2018      Reactions   Tape Rash   Blisters   Latex Rash, Other (See Comments)   blister   Lidocaine Nausea And Vomiting   Sulfa Antibiotics Nausea And Vomiting      Medication List    TAKE these medications   amoxicillin 500 MG tablet Commonly known as:  AMOXIL Take 500 mg by mouth 3 (three) times daily.   amoxicillin 500 MG capsule Commonly known as:  AMOXIL Take 1 capsule (500 mg total) by mouth 3 (three) times daily.   aspirin EC 81 MG tablet Take 81 mg by mouth daily.   CALTRATE 600 PLUS-VIT D PO Take 1 tablet by mouth 2 (two) times daily.   CENTRUM SILVER ULTRA WOMENS PO Take 1 tablet by mouth every evening.   PRESERVISION AREDS PO Take 2 capsules by mouth daily.   cephALEXin 500 MG capsule Commonly known as:  KEFLEX Take 500 mg by mouth 2 (two) times daily.   docusate sodium 100 MG capsule Commonly known as:  COLACE Take 300 mg by mouth daily.   esomeprazole 40 MG capsule Commonly known as:  NEXIUM Take 40 mg by mouth every evening.   estradiol 0.5 MG tablet Commonly known as:  ESTRACE Take 0.25 mg by mouth at bedtime.   famotidine 20 MG tablet Commonly known as:  PEPCID Take 20 mg by mouth daily before breakfast.   ferrous sulfate 325 (65 FE) MG tablet Take 325 mg by mouth 2 (two) times daily.   HYDROcodone-acetaminophen 5-325 MG tablet Commonly known as:  NORCO/VICODIN Take 1 tablet by mouth every 6 (six) hours as needed for moderate pain.   HYDROcodone-acetaminophen 5-325 MG tablet Commonly known as:  NORCO/VICODIN Take 2 tablets by mouth  every 12 (twelve) hours as needed for severe pain.   levothyroxine 200 MCG tablet Commonly known as:  SYNTHROID, LEVOTHROID Take 200 mcg by mouth daily before breakfast.   medroxyPROGESTERone 5 MG tablet Commonly known as:  PROVERA Take 2.5 mg by mouth daily with supper.   metoprolol succinate 50 MG 24 hr tablet Commonly known as:  TOPROL-XL Take  50 mg by mouth every morning.   nortriptyline 25 MG capsule Commonly known as:  PAMELOR Take 75 mg by mouth daily.   OVER THE COUNTER MEDICATION Apply 1 application topically daily as needed. Stop Pain Roll On   simvastatin 20 MG tablet Commonly known as:  ZOCOR Take 20 mg by mouth every evening.   SYSTANE ULTRA OP Place 2 drops into both eyes at bedtime.   traMADol 50 MG tablet Commonly known as:  ULTRAM Take 1 tablet (50 mg total) by mouth every 6 (six) hours as needed.   TYLENOL 8 HOUR ARTHRITIS PAIN 650 MG CR tablet Generic drug:  acetaminophen Take 1,300 mg by mouth 2 (two) times daily.   vitamin B-12 1000 MCG tablet Commonly known as:  CYANOCOBALAMIN Take 1,000 mcg by mouth daily.   vitamin C 500 MG tablet Commonly known as:  ASCORBIC ACID Take 500 mg by mouth daily.   Vitamin D (Ergocalciferol) 1.25 MG (50000 UT) Caps capsule Commonly known as:  DRISDOL take ONE CAPSULE BY MOUTH every 7 DAYS What changed:    how much to take  how to take this  when to take this  additional instructions   vitamin E 400 UNIT capsule Take 800 Units by mouth daily.   zolpidem 10 MG tablet Commonly known as:  AMBIEN Take 1 tablet (10 mg total) by mouth at bedtime.     ASK your doctor about these medications   cephALEXin 500 MG capsule Commonly known as:  KEFLEX Take 500 mg by mouth 2 (two) times daily. Ask about: Should I take this medication?      Follow-up Information    Velmer Broadfoot, Candee Furbish, MD. Call in 2 week(s).   Specialty:  Urology Contact information: [place] [address] [phone]           Signed: Nicolette Bang 03/19/2018, 7:48 PM

## 2018-03-21 ENCOUNTER — Telehealth: Payer: Self-pay | Admitting: Oncology

## 2018-03-21 ENCOUNTER — Inpatient Hospital Stay: Payer: Medicare Other | Attending: Oncology | Admitting: Oncology

## 2018-03-21 VITALS — BP 152/84 | HR 89 | Temp 97.6°F | Resp 18 | Ht 65.0 in | Wt 126.4 lb

## 2018-03-21 DIAGNOSIS — E079 Disorder of thyroid, unspecified: Secondary | ICD-10-CM | POA: Insufficient documentation

## 2018-03-21 DIAGNOSIS — Z905 Acquired absence of kidney: Secondary | ICD-10-CM | POA: Diagnosis not present

## 2018-03-21 DIAGNOSIS — Z79899 Other long term (current) drug therapy: Secondary | ICD-10-CM | POA: Insufficient documentation

## 2018-03-21 DIAGNOSIS — Z793 Long term (current) use of hormonal contraceptives: Secondary | ICD-10-CM | POA: Diagnosis not present

## 2018-03-21 DIAGNOSIS — C652 Malignant neoplasm of left renal pelvis: Secondary | ICD-10-CM | POA: Diagnosis present

## 2018-03-21 DIAGNOSIS — C787 Secondary malignant neoplasm of liver and intrahepatic bile duct: Secondary | ICD-10-CM | POA: Diagnosis not present

## 2018-03-21 DIAGNOSIS — C7972 Secondary malignant neoplasm of left adrenal gland: Secondary | ICD-10-CM | POA: Insufficient documentation

## 2018-03-21 DIAGNOSIS — D49519 Neoplasm of unspecified behavior of unspecified kidney: Secondary | ICD-10-CM

## 2018-03-21 DIAGNOSIS — Z7982 Long term (current) use of aspirin: Secondary | ICD-10-CM | POA: Diagnosis not present

## 2018-03-21 DIAGNOSIS — C649 Malignant neoplasm of unspecified kidney, except renal pelvis: Secondary | ICD-10-CM

## 2018-03-21 NOTE — Progress Notes (Signed)
Hematology and Oncology Follow Up Visit  [name] [id number] 11/11/1932 85 y.o. 03/21/2018 8:57 AM [name], MDKnowlton, Richardson Landry, MD   Principle Diagnosis: 85 year old woman with urothelial carcinoma of the left renal pelvis extending into the perinephric fat and adrenal gland diagnosed in February 2020.  She has stage IV disease with potential hepatic metastasis.   Prior Therapy:  She is status post a left robotic laparoscopic radical nephrectomy and adrenalectomy completed on March 10, 2018.  No lymph node dissection performed.    Current therapy:   Post operative care and active surveillance.  Interim History: [name] returns today for repeat evaluation.  Since the last visit, she underwent radical nephrectomy and adrenal resection completed by Dr. Alyson Ingles on March 09, 2018.  Since her operation, she is recovering rather slowly and remains quite debilitated.  She ambulates short distances with the help of a walker and had one episode of a fall.  He denies any abdominal pain or discomfort.  She denies any further hematuria or dysuria.  She denies any recent hospitalization after her discharge from the operation.  Her performance status is marginal at this time.  She does not report any headaches, blurry vision, syncope or seizures. Does not report any fevers, chills or sweats.  Does not report any cough, wheezing or hemoptysis.  Does not report any chest pain, palpitation, orthopnea or leg edema.  Does not report any nausea, vomiting or abdominal pain.  Does not report any constipation or diarrhea.  Does not report any skeletal complaints.    Does not report frequency, urgency or hematuria.  Does not report any skin rashes or lesions. Does not report any heat or cold intolerance.  Does not report any lymphadenopathy or petechiae.  Does not report any anxiety or depression.  Remaining review of systems is negative.    Medications: I have reviewed the patient's current  medications.  Current Outpatient Medications  Medication Sig Dispense Refill  . acetaminophen (TYLENOL 8 HOUR ARTHRITIS PAIN) 650 MG CR tablet Take 1,300 mg by mouth 2 (two) times daily.     Marland Kitchen amoxicillin (AMOXIL) 500 MG capsule Take 1 capsule (500 mg total) by mouth 3 (three) times daily. 30 capsule 0  . amoxicillin (AMOXIL) 500 MG tablet Take 500 mg by mouth 3 (three) times daily.    Marland Kitchen aspirin EC 81 MG tablet Take 81 mg by mouth daily.    . Calcium-Vitamin D (CALTRATE 600 PLUS-VIT D PO) Take 1 tablet by mouth 2 (two) times daily.     . cephALEXin (KEFLEX) 500 MG capsule Take 500 mg by mouth 2 (two) times daily.    Marland Kitchen docusate sodium (COLACE) 100 MG capsule Take 300 mg by mouth daily.     Marland Kitchen esomeprazole (NEXIUM) 40 MG capsule Take 40 mg by mouth every evening.     Marland Kitchen estradiol (ESTRACE) 0.5 MG tablet Take 0.25 mg by mouth at bedtime.   3  . famotidine (PEPCID) 20 MG tablet Take 20 mg by mouth daily before breakfast.     . ferrous sulfate 325 (65 FE) MG tablet Take 325 mg by mouth 2 (two) times daily.    Marland Kitchen HYDROcodone-acetaminophen (NORCO/VICODIN) 5-325 MG tablet Take 2 tablets by mouth every 12 (twelve) hours as needed for severe pain. 10 tablet 0  . HYDROcodone-acetaminophen (NORCO/VICODIN) 5-325 MG tablet Take 1 tablet by mouth every 6 (six) hours as needed for moderate pain.    Marland Kitchen levothyroxine (SYNTHROID, LEVOTHROID) 200 MCG tablet Take 200 mcg by  mouth daily before breakfast.    . medroxyPROGESTERone (PROVERA) 5 MG tablet Take 2.5 mg by mouth daily with supper.     . metoprolol succinate (TOPROL-XL) 50 MG 24 hr tablet Take 50 mg by mouth every morning.     . Multiple Vitamins-Minerals (CENTRUM SILVER ULTRA WOMENS PO) Take 1 tablet by mouth every evening.     . Multiple Vitamins-Minerals (PRESERVISION AREDS PO) Take 2 capsules by mouth daily.     . nortriptyline (PAMELOR) 25 MG capsule Take 75 mg by mouth daily.     Marland Kitchen OVER THE COUNTER MEDICATION Apply 1 application topically daily as needed.  Stop Pain Roll On    . Polyethyl Glycol-Propyl Glycol (SYSTANE ULTRA OP) Place 2 drops into both eyes at bedtime.    . simvastatin (ZOCOR) 20 MG tablet Take 20 mg by mouth every evening.     . traMADol (ULTRAM) 50 MG tablet Take 1 tablet (50 mg total) by mouth every 6 (six) hours as needed. 30 tablet 0  . vitamin B-12 (CYANOCOBALAMIN) 1000 MCG tablet Take 1,000 mcg by mouth daily.    . vitamin C (ASCORBIC ACID) 500 MG tablet Take 500 mg by mouth daily.     . Vitamin D, Ergocalciferol, (DRISDOL) 50000 units CAPS capsule take ONE CAPSULE BY MOUTH every 7 DAYS (Patient taking differently: Take 50,000 Units by mouth every 30 (thirty) days. 1st day of each month) 30 capsule 3  . vitamin E 400 UNIT capsule Take 800 Units by mouth daily.     Marland Kitchen zolpidem (AMBIEN) 10 MG tablet Take 1 tablet (10 mg total) by mouth at bedtime. 30 tablet 0   No current facility-administered medications for this visit.      Allergies:  Allergies  Allergen Reactions  . Tape Rash    Blisters  . Latex Rash and Other (See Comments)    blister  . Lidocaine Nausea And Vomiting  . Sulfa Antibiotics Nausea And Vomiting    Past Medical History, Surgical history, Social history, and Family History were reviewed and updated.    Physical Exam: Blood pressure (!) 152/84, pulse 89, temperature 97.6 F (36.4 C), temperature source Oral, resp. rate 18, height 5\' 5"  (1.651 m), weight 126 lb 6.4 oz (57.3 kg), last menstrual period 01/17/1994, SpO2 99 %.   ECOG: 2 General appearance: alert and cooperative appeared without distress. Head: Normocephalic, without obvious abnormality Oropharynx: No oral thrush or ulcers. Eyes: No scleral icterus.  Pupils are equal and round reactive to light. Lymph nodes: Cervical, supraclavicular, and axillary nodes normal. Heart:regular rate and rhythm, S1, S2 normal, no murmur, click, rub or gallop Lung:chest clear, no wheezing, rales, normal symmetric air entry Abdomin: soft, non-tender,  without masses or organomegaly. Neurological: No motor, sensory deficits.  Intact deep tendon reflexes. Skin: No rashes or lesions.  No ecchymosis or petechiae. Musculoskeletal: No joint deformity or effusion. Psychiatric: Mood and affect are appropriate.    Lab Results: Lab Results  Component Value Date   WBC 12.1 (H) 03/04/2018   HGB 10.1 (L) 03/12/2018   HCT 32.8 (L) 03/12/2018   MCV 101.0 (H) 03/04/2018   PLT 563 (H) 03/04/2018     Chemistry      Component Value Date/Time   NA 133 (L) 03/12/2018 0621   NA 137 07/07/2015 1040   K 4.5 03/12/2018 0621   K 5.1 07/07/2015 1040   CL 99 03/12/2018 0621   CL 101 07/02/2012 0935   CO2 27 03/12/2018 0621   CO2 27  07/07/2015 1040   BUN 13 03/12/2018 0621   BUN 18.1 07/07/2015 1040   CREATININE 0.96 03/12/2018 0621   CREATININE 1.0 07/07/2015 1040      Component Value Date/Time   CALCIUM 8.0 (L) 03/12/2018 0621   CALCIUM 9.6 07/07/2015 1040   ALKPHOS 71 02/03/2018 0511   ALKPHOS 82 07/07/2015 1040   AST 17 02/03/2018 0511   AST 18 07/07/2015 1040   ALT 14 02/03/2018 0511   ALT 15 07/07/2015 1040   BILITOT 0.6 02/03/2018 0511   BILITOT 0.33 07/07/2015 1040       Radiological Studies:  IMPRESSION: Slight increase in size of an infiltrating mass within the upper pole of the left kidney. Slightly more regional edema/stranding that could be edema or tumor invasion. Tumor invasion into the collecting system, no longer with any excretion evident on the delayed imaging. Tumor may also be extending into the proximal renal vein. Metastatic disease of the left adrenal gland similar. Progressive metastatic disease to the liver, with a low-density lesion now being visible along the anterolateral right lobe and probable new disease in the more caudal right lobe. Calcified mesenteric nodal mass is unchanged from the recent study. It is larger than was seen in 2014 but this is unlikely to relate to this renal  malignancy.   Impression and Plan:  85 year old woman with the following:  1.  High-grade urothelial carcinoma of the left renal pelvis.  She is status post left nephrectomy with disease involvement in the adrenal gland.  CT scan on 02/27/2018 showed possible hepatic involvement.  The natural course of this disease and treatment options were reviewed at this time.  Upon her recovery, I would recommend proceeding with systemic therapy.  That will be in the form of carboplatin based chemotherapy versus immunotherapy.  Given her high risk disease and potentially hepatic metastasis.  Complication associated with carboplatin based therapy include nausea, vomiting, myelosuppression, neutropenia and neutropenic sepsis.  The benefit would be improvement in overall survival on her disease control and palliation of symptoms. Complication associated with immunotherapy include colitis, pneumonitis and thyroid disease.  The plan at this time is to give her a short period of observation and recovery given her quite debilitated status after operation.  I plan to repeat imaging studies in 4 weeks and we will check PDL 1 status on her tumor.  Depending on these findings we will determine whether to proceed with carboplatin chemotherapy versus immunotherapy.  2.  IV access: Peripheral veins will be used at this time and will defer inserting a Port-A-Cath.  3.  Goals of care and long-term prognosis: She understands he has a rather malignant tumor with high risk of metastatic spread beyond local regional and has already spread potentially to the liver.  Therapy most likely will be palliative to improve quality of life moving forward.  Her performance status is marginal at this time but I anticipate improvement after number recovery from her operation.  4.  Follow-up: Will be in the next 4 weeks or so after repeating imaging studies to determine the next course of systemic therapy.  25  minutes was spent with the  patient face-to-face today.  More than 50% of time was dedicated to reviewing laboratory data, imaging studies, discussing disease status and answering questions regarding future plan of care.   Zola Button, MD 3/4/20208:57 AM

## 2018-03-21 NOTE — Telephone Encounter (Signed)
Gave avs and calendar ° °

## 2018-04-09 ENCOUNTER — Other Ambulatory Visit: Payer: Self-pay | Admitting: Oncology

## 2018-04-09 ENCOUNTER — Telehealth: Payer: Self-pay

## 2018-04-09 DIAGNOSIS — C649 Malignant neoplasm of unspecified kidney, except renal pelvis: Secondary | ICD-10-CM

## 2018-04-09 NOTE — Telephone Encounter (Signed)
Received call from patient son stating that during the patient last appointment with Dr. Alyson Ingles that patient was referred back to [name] for further work-up since the patient has been more confused. Returned call to [name] and made aware that [name] has ordered an MRI of the brain and a lab appointment and that he should hear from scheduling as to the date and time of the lab appointment. Also provided [name] the contact for central scheduling to schedule MRI. [name] verbalized understanding and knows to call back with any questions or concerns.

## 2018-04-11 ENCOUNTER — Ambulatory Visit (HOSPITAL_COMMUNITY)
Admission: RE | Admit: 2018-04-11 | Discharge: 2018-04-11 | Disposition: A | Discharge status: Home / Self Care | Payer: Medicare Other | Source: Ambulatory Visit | Attending: Oncology | Admitting: Oncology

## 2018-04-11 ENCOUNTER — Other Ambulatory Visit: Payer: Self-pay

## 2018-04-11 ENCOUNTER — Emergency Department (HOSPITAL_COMMUNITY)
Admission: EM | Admit: 2018-04-11 | Discharge: 2018-04-11 | Disposition: A | Discharge status: Home / Self Care | Payer: Medicare Other | Source: Home / Self Care | Attending: Emergency Medicine | Admitting: Emergency Medicine

## 2018-04-11 ENCOUNTER — Emergency Department (HOSPITAL_COMMUNITY): Payer: Medicare Other

## 2018-04-11 ENCOUNTER — Telehealth: Payer: Self-pay | Admitting: *Deleted

## 2018-04-11 DIAGNOSIS — F329 Major depressive disorder, single episode, unspecified: Secondary | ICD-10-CM | POA: Insufficient documentation

## 2018-04-11 DIAGNOSIS — E119 Type 2 diabetes mellitus without complications: Secondary | ICD-10-CM | POA: Insufficient documentation

## 2018-04-11 DIAGNOSIS — Z79899 Other long term (current) drug therapy: Secondary | ICD-10-CM

## 2018-04-11 DIAGNOSIS — Z9049 Acquired absence of other specified parts of digestive tract: Secondary | ICD-10-CM | POA: Insufficient documentation

## 2018-04-11 DIAGNOSIS — E039 Hypothyroidism, unspecified: Secondary | ICD-10-CM | POA: Insufficient documentation

## 2018-04-11 DIAGNOSIS — I2510 Atherosclerotic heart disease of native coronary artery without angina pectoris: Secondary | ICD-10-CM

## 2018-04-11 DIAGNOSIS — C649 Malignant neoplasm of unspecified kidney, except renal pelvis: Secondary | ICD-10-CM

## 2018-04-11 DIAGNOSIS — Z91040 Latex allergy status: Secondary | ICD-10-CM

## 2018-04-11 DIAGNOSIS — Z96641 Presence of right artificial hip joint: Secondary | ICD-10-CM

## 2018-04-11 DIAGNOSIS — Z7982 Long term (current) use of aspirin: Secondary | ICD-10-CM | POA: Insufficient documentation

## 2018-04-11 DIAGNOSIS — Z85528 Personal history of other malignant neoplasm of kidney: Secondary | ICD-10-CM | POA: Insufficient documentation

## 2018-04-11 DIAGNOSIS — I10 Essential (primary) hypertension: Secondary | ICD-10-CM | POA: Insufficient documentation

## 2018-04-11 DIAGNOSIS — R410 Disorientation, unspecified: Secondary | ICD-10-CM

## 2018-04-11 DIAGNOSIS — R4182 Altered mental status, unspecified: Secondary | ICD-10-CM | POA: Diagnosis not present

## 2018-04-11 DIAGNOSIS — Z96651 Presence of right artificial knee joint: Secondary | ICD-10-CM

## 2018-04-11 DIAGNOSIS — F0390 Unspecified dementia without behavioral disturbance: Secondary | ICD-10-CM | POA: Insufficient documentation

## 2018-04-11 DIAGNOSIS — E860 Dehydration: Secondary | ICD-10-CM | POA: Diagnosis not present

## 2018-04-11 LAB — CBC WITH DIFFERENTIAL/PLATELET
Abs Immature Granulocytes: 0.03 10*3/uL (ref 0.00–0.07)
BASOS ABS: 0 10*3/uL (ref 0.0–0.1)
Basophils Relative: 0 %
EOS ABS: 0.1 10*3/uL (ref 0.0–0.5)
EOS PCT: 2 %
HCT: 35.8 % — ABNORMAL LOW (ref 36.0–46.0)
Hemoglobin: 11 g/dL — ABNORMAL LOW (ref 12.0–15.0)
IMMATURE GRANULOCYTES: 0 %
Lymphocytes Relative: 12 %
Lymphs Abs: 1 10*3/uL (ref 0.7–4.0)
MCH: 30.6 pg (ref 26.0–34.0)
MCHC: 30.7 g/dL (ref 30.0–36.0)
MCV: 99.7 fL (ref 80.0–100.0)
Monocytes Absolute: 1.1 10*3/uL — ABNORMAL HIGH (ref 0.1–1.0)
Monocytes Relative: 14 %
NRBC: 0 % (ref 0.0–0.2)
Neutro Abs: 6.1 10*3/uL (ref 1.7–7.7)
Neutrophils Relative %: 72 %
Platelets: 383 10*3/uL (ref 150–400)
RBC: 3.59 MIL/uL — ABNORMAL LOW (ref 3.87–5.11)
RDW: 14.6 % (ref 11.5–15.5)
WBC: 8.4 10*3/uL (ref 4.0–10.5)

## 2018-04-11 LAB — URINALYSIS, ROUTINE W REFLEX MICROSCOPIC
Bilirubin Urine: NEGATIVE
Glucose, UA: NEGATIVE mg/dL
Hgb urine dipstick: NEGATIVE
Ketones, ur: NEGATIVE mg/dL
Nitrite: NEGATIVE
Protein, ur: NEGATIVE mg/dL
Specific Gravity, Urine: 1.011 (ref 1.005–1.030)
pH: 6 (ref 5.0–8.0)

## 2018-04-11 LAB — COMPREHENSIVE METABOLIC PANEL
ALK PHOS: 473 U/L — AB (ref 38–126)
ALT: 98 U/L — ABNORMAL HIGH (ref 0–44)
AST: 80 U/L — ABNORMAL HIGH (ref 15–41)
Albumin: 3 g/dL — ABNORMAL LOW (ref 3.5–5.0)
Anion gap: 8 (ref 5–15)
BUN: 28 mg/dL — ABNORMAL HIGH (ref 8–23)
CO2: 28 mmol/L (ref 22–32)
Calcium: 9.1 mg/dL (ref 8.9–10.3)
Chloride: 96 mmol/L — ABNORMAL LOW (ref 98–111)
Creatinine, Ser: 1.05 mg/dL — ABNORMAL HIGH (ref 0.44–1.00)
GFR calc Af Amer: 57 mL/min — ABNORMAL LOW (ref >60)
GFR calc non Af Amer: 49 mL/min — ABNORMAL LOW (ref >60)
Glucose, Bld: 177 mg/dL — ABNORMAL HIGH (ref 70–99)
Potassium: 4.4 mmol/L (ref 3.5–5.1)
Sodium: 132 mmol/L — ABNORMAL LOW (ref 135–145)
Total Bilirubin: 0.3 mg/dL (ref 0.3–1.2)
Total Protein: 7.5 g/dL (ref 6.5–8.1)

## 2018-04-11 MED ORDER — SODIUM CHLORIDE 0.9 % IV SOLN
INTRAVENOUS | Status: DC
  Administered 2018-04-11: 20:00:00 via INTRAVENOUS

## 2018-04-11 MED ORDER — SODIUM CHLORIDE 0.9 % IV BOLUS
500.0000 mL | Freq: Once | INTRAVENOUS | Status: AC
  Administered 2018-04-11: 500 mL via INTRAVENOUS

## 2018-04-11 MED ORDER — GADOBUTROL 1 MMOL/ML IV SOLN
5.0000 mL | Freq: Once | INTRAVENOUS | Status: AC | PRN
  Administered 2018-04-11: 5 mL via INTRAVENOUS

## 2018-04-11 NOTE — Telephone Encounter (Signed)
Conveyed provider orders for [name] to report to ED to her son. Allison Quarry.  "I will take her to [name]."

## 2018-04-11 NOTE — ED Notes (Signed)
Pt confused. Oriented to self and place. Pt rambles, disorganized, unable to answer majority of assessment questions appropriately. Picking and pulling at lines, fidgety .

## 2018-04-11 NOTE — ED Notes (Addendum)
Patient transported to XRAY 

## 2018-04-11 NOTE — ED Provider Notes (Signed)
[facility] DEPT Provider Note   CSN: [id number] Arrival date & time: 04/11/18  1654    History   Chief Complaint Chief Complaint  Patient presents with  . Altered Mental Status    HPI [name] is a 85 y.o. female.     Patient brought in by son.  They had been in conversation with [name] today patient's oncologist..  Felt that patient had been agitated not answering and sleeping more than usual.  Son seem to think that she had a brain tumor.  Patient did have an MRI ordered today results of that are normal no evidence of any brain abnormality or stroke or metastatic disease.  Patient does have metastatic bladder cancer also does have liver metastatic disease.  Son is noticed significant change in her since her surgery in February.  And does not understand why her confusion is not better and also does understand why it appears to be getting worse.  He is also concerned that she is not eating or drinking.     Past Medical History:  Diagnosis Date  . Abdominal pain   . Anemia   . Calcification of aorta (HCC) 11/16/2012  . Coronary artery calcification 02/14/2018   cardiologist-- dr t. Oval Linsey  . Depression   . Diabetes mellitus type 2, diet-controlled ([place])    pt denies  . DJD (degenerative joint disease)   . DM II (diabetes mellitus, type II), controlled 11/16/2012  . Dysuria   . Epistaxis   . Family history of adverse reaction to anesthesia    sister--- ponv  . Fibromyalgia   . GERD (gastroesophageal reflux disease)   . Goiter   . Gross hematuria   . Headache(784.0)    tension headache daily per pt   . Heart murmur   . Hiatal hernia   . History of vertebral compression fracture   . Hypertension   . Hypothyroidism    endocrinologist-- dr Chalmers Cater  . Left adrenal mass ([place])   . Left renal mass   . Left thyroid nodule    hx biopsy 2013, benign  . Macular degeneration    both  left > right  . Osteoarthritis   .  Osteoporosis   . Peripheral vascular disease ([place])    varicose veins in left arm   . 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 PONV (postoperative nausea and vomiting)   . Renal cell cancer, left [facility])    oncologist-- dr Alen Blew  . TMJ syndrome    right   . UTI (urinary tract infection) 02/28/2018  . Wears glasses     Patient Active Problem List   Diagnosis Date Noted  . Protein-calorie malnutrition, severe 03/12/2018  . Renal mass 03/09/2018  . Coronary artery calcification 02/14/2018  . Near syncope 02/02/2018  . Orthostatic hypotension 02/02/2018  . Anemia 02/02/2018  . Suprapubic discomfort 07/06/2017  . Vitamin D deficiency 08/19/2015  . Closed right hip fracture ([place]) 05/30/2015  . Chest pain syndrome 05/30/2015  . Fall at home 05/30/2015  . Closed right femoral fracture ([place]) 05/30/2015  . Displaced fracture of right femoral neck ([place]) 05/30/2015  . Fall   . Cough 07/02/2013  . Other dyspnea and respiratory abnormality 07/02/2013  . History of colon cancer, stage  III 07/02/2013  . Chronic cholecystitis with calculus s/p lap chole 11/17/2012 11/19/2012  . GERD (gastroesophageal reflux disease) 11/16/2012  . Choledocholithiasis with obstruction 11/16/2012  . Calcification of aorta (HCC) 11/16/2012  . Atherosclerosis 11/16/2012  . Hypothyroidism 11/15/2012  . Nonspecific (abnormal) findings on radiological and other examination of gastrointestinal tract 11/08/2012  . S/P right knee replacement 12/22/2010  . Preop cardiovascular exam 12/08/2010  . Essential hypertension 12/08/2010  . Hyperlipidemia 12/08/2010    Past Surgical History:  Procedure Laterality Date  . APPENDECTOMY  age 40  . CATARACT EXTRACTION W/ INTRAOCULAR LENS  IMPLANT, BILATERAL  2004   approx.  . CHOLECYSTECTOMY N/A 11/17/2012   Procedure: LAPAROSCOPIC CHOLECYSTECTOMY;  Surgeon: Leighton Ruff, MD;  Location: [facility];  Service: General;  Laterality: N/A;  . COLONOSCOPY W/ BIOPSIES  09/25/2002, 09/18/2001  . ERCP N/A 11/14/2012   Procedure: ENDOSCOPIC RETROGRADE CHOLANGIOPANCREATOGRAPHY (ERCP);  Surgeon: Jeryl Columbia, MD;  Location: [facility];  Service: Endoscopy;  Laterality: N/A;  . ESOPHAGOGASTRODUODENOSCOPY ENDOSCOPY    . KYPHOPLASTY  01/2011  . LAPAROSCOPIC RIGHT HEMI COLECTOMY  10-03-2001    dr Hassell Done @[facility]   . [place]  2000  . porta catheter placement     REMOVAL 2005  . ROBOT ASSISTED LAPAROSCOPIC NEPHRECTOMY Left 03/09/2018   Procedure: XI ROBOTIC ASSISTED LAPAROSCOPIC NEPHRECTOMY;  Surgeon: Cleon Gustin, MD;  Location: [facility];  Service: Urology;  Laterality: Left;  3 HRS  . TEMPOROMANDIBULAR JOINT SURGERY Right 1990s  . TONSILLECTOMY  child  . TOTAL HIP ARTHROPLASTY Right 05/30/2015   Procedure: TOTAL HIP ARTHROPLASTY ANTERIOR APPROACH;  Surgeon: Rod Can, MD;  Location: [facility];  Service: Orthopedics;  Laterality: Right;  . TOTAL KNEE ARTHROPLASTY  12/20/2010   Procedure: TOTAL KNEE ARTHROPLASTY;  Surgeon: Mauri Pole;  Location: [facility];  Service: Orthopedics;  Laterality: Right;  . TOTAL KNEE ARTHROPLASTY Right 12-20-2010  dr Alvan Dame @[facility]   . UPPER GASTROINTESTINAL ENDOSCOPY  09/18/2001     OB History    Gravida  1   Para  1   Term  1   Preterm  0   AB  0   Living  1     SAB  0   TAB  0   Ectopic  0   Multiple  0   Live Births  1            Home Medications    Prior to Admission medications   Medication Sig Start Date End Date Taking? Authorizing Provider  acetaminophen (TYLENOL 8 HOUR ARTHRITIS PAIN) 650 MG CR tablet Take 1,300 mg by mouth 2 (two) times daily.    Yes [provider]  aspirin EC 81 MG tablet Take 81 mg by mouth daily.   Yes [provider]  Calcium-Vitamin D (CALTRATE 600 PLUS-VIT D  PO) Take 1 tablet by mouth 2 (two) times daily.    Yes [provider]  docusate sodium (COLACE) 100 MG capsule Take 300 mg by mouth daily.    Yes [provider]  esomeprazole (NEXIUM) 40 MG capsule Take 40 mg by mouth 2 (two) times daily at 8 am and 10 pm.    Yes [provider]  estradiol (ESTRACE) 0.5 MG tablet Take 0.25 mg by mouth every morning.  05/16/16  Yes [provider]  famotidine (PEPCID) 20 MG tablet Take 20 mg by mouth daily before breakfast.    Yes [provider]  ferrous sulfate 325 (65 FE) MG tablet Take 325  mg by mouth 2 (two) times daily.   Yes [provider]  levothyroxine (SYNTHROID, LEVOTHROID) 200 MCG tablet Take 200 mcg by mouth daily before breakfast.   Yes [provider]  medroxyPROGESTERone (PROVERA) 5 MG tablet Take 2.5 mg by mouth every morning.    Yes [provider]  metoprolol succinate (TOPROL-XL) 50 MG 24 hr tablet Take 50 mg by mouth every morning.    Yes [provider]  Multiple Vitamins-Minerals (CENTRUM SILVER ULTRA WOMENS PO) Take 1 tablet by mouth every evening.    Yes [provider]  Multiple Vitamins-Minerals (PRESERVISION AREDS PO) Take 2 capsules by mouth daily.    Yes [provider]  nortriptyline (PAMELOR) 25 MG capsule Take 75 mg by mouth daily.    Yes [provider]  Polyethyl Glycol-Propyl Glycol (SYSTANE ULTRA OP) Place 2 drops into both eyes at bedtime.   Yes [provider]  simvastatin (ZOCOR) 20 MG tablet Take 20 mg by mouth every evening.    Yes [provider]  vitamin B-12 (CYANOCOBALAMIN) 1000 MCG tablet Take 1,000 mcg by mouth daily.   Yes [provider]  vitamin C (ASCORBIC ACID) 500 MG tablet Take 500 mg by mouth daily.    Yes [provider]  Vitamin D, Ergocalciferol, (DRISDOL) 50000 units CAPS capsule take ONE CAPSULE BY MOUTH every 7 DAYS Patient taking differently: Take 50,000 Units  by mouth every 30 (thirty) days. 1st day of each month 08/19/15  Yes Kem Boroughs, FNP  vitamin E 400 UNIT capsule Take 800 Units by mouth daily.    Yes [provider]  zolpidem (AMBIEN) 10 MG tablet Take 1 tablet (10 mg total) by mouth at bedtime. 06/01/15  Yes Theodis Blaze, MD  amoxicillin (AMOXIL) 500 MG capsule Take 1 capsule (500 mg total) by mouth 3 (three) times daily. Patient not taking: Reported on 04/11/2018 02/01/18   Rolland Porter, MD  HYDROcodone-acetaminophen (NORCO/VICODIN) 5-325 MG tablet Take 2 tablets by mouth every 12 (twelve) hours as needed for severe pain. Patient not taking: Reported on 04/11/2018 03/04/18   Virgel Manifold, MD  traMADol (ULTRAM) 50 MG tablet Take 1 tablet (50 mg total) by mouth every 6 (six) hours as needed. Patient not taking: Reported on 04/11/2018 03/15/18 03/15/19  Cleon Gustin, MD    Family History Family History  Problem Relation Age of Onset  . Heart disease Mother        Enlarged heart  . Arrhythmia Mother   . Diabetes Mother   . Heart attack Mother   . Heart disease Brother        MI at unknown age  . Anesthesia problems Brother   . Heart disease Father   . Heart attack Father   . Cancer Son        non-hodgkins lymphoma  . Stroke Sister   . Heart attack Other        MI at age 102  . Heart disease Brother        CABG  . AAA (abdominal aortic aneurysm) Brother   . CAD Brother     Social History Social History   Tobacco Use  . Smoking status: Never Smoker  . Smokeless tobacco: Never Used  Substance Use Topics  . Alcohol use: No  . Drug use: No     Allergies   Tape; Latex; Lidocaine; and Sulfa antibiotics   Review of Systems Review of Systems  Unable to perform ROS: Dementia  Physical Exam Updated Vital Signs BP (!) 170/96   Pulse 93   Temp 97.7 F (36.5 C) (Oral)   Resp 18   LMP 01/17/1994   SpO2 97%   Physical Exam Vitals signs and nursing note reviewed.  Constitutional:      General: She  is not in acute distress.    Appearance: She is well-developed.  HENT:     Head: Normocephalic and atraumatic.     Nose: No congestion.     Mouth/Throat:     Comments: Mucous membranes slightly dry Eyes:     Extraocular Movements: Extraocular movements intact.     Conjunctiva/sclera: Conjunctivae normal.     Pupils: Pupils are equal, round, and reactive to light.  Neck:     Musculoskeletal: Neck supple.  Cardiovascular:     Rate and Rhythm: Normal rate and regular rhythm.     Heart sounds: Normal heart sounds. No murmur.  Pulmonary:     Effort: Pulmonary effort is normal. No respiratory distress.     Breath sounds: Normal breath sounds.  Abdominal:     General: Bowel sounds are normal.     Palpations: Abdomen is soft.     Tenderness: There is no abdominal tenderness.  Musculoskeletal: Normal range of motion.  Skin:    General: Skin is warm and dry.  Neurological:     Mental Status: She is alert. She is disoriented.     Comments: Patient alert no depressed mental status does have confusion.  Patient is easily redirectable.  No significant inappropriate behavior.  Patient moving all 4 extremities.      ED Treatments / Results  Labs (all labs ordered are listed, but only abnormal results are displayed) Labs Reviewed  COMPREHENSIVE METABOLIC PANEL - Abnormal; Notable for the following components:      Result Value   Sodium 132 (*)    Chloride 96 (*)    Glucose, Bld 177 (*)    BUN 28 (*)    Creatinine, Ser 1.05 (*)    Albumin 3.0 (*)    AST 80 (*)    ALT 98 (*)    Alkaline Phosphatase 473 (*)    GFR calc non Af Amer 49 (*)    GFR calc Af Amer 57 (*)    All other components within normal limits  CBC WITH DIFFERENTIAL/PLATELET - Abnormal; Notable for the following components:   RBC 3.59 (*)    Hemoglobin 11.0 (*)    HCT 35.8 (*)    Monocytes Absolute 1.1 (*)    All other components within normal limits  URINALYSIS, ROUTINE W REFLEX MICROSCOPIC - Abnormal; Notable for  the following components:   Leukocytes,Ua TRACE (*)    Bacteria, UA RARE (*)    All other components within normal limits  URINE CULTURE    EKG EKG Interpretation  Date/Time:  Wednesday April 11 2018 19:01:58 EDT Ventricular Rate:  91 PR Interval:    QRS Duration: 103 QT Interval:  360 QTC Calculation: 443 R Axis:   16 Text Interpretation:  Sinus rhythm Probable left atrial enlargement Baseline wander in lead(s) II III aVF No significant change since last tracing Confirmed by Fredia Sorrow [phone]) on 04/11/2018 7:13:49 PM   Radiology Dg Chest 2 View  Result Date: 04/11/2018 CLINICAL DATA:  Altered mental status EXAM: CHEST - 2 VIEW COMPARISON:  PET-CT 01/18/2018, chest x-ray 12/20/2017 FINDINGS: There is no focal consolidation. There is no pleural effusion or pneumothorax. The heart and mediastinal contours are unremarkable. The  osseous structures are unremarkable. IMPRESSION: No active cardiopulmonary disease. Electronically Signed   By: Kathreen Devoid   On: 04/11/2018 19:08   Mr Brain W Wo Contrast  Result Date: 04/11/2018 CLINICAL DATA:  Urothelial carcinoma.  Confusion.  Staging. EXAM: MRI HEAD WITHOUT AND WITH CONTRAST TECHNIQUE: Multiplanar, multiecho pulse sequences of the brain and surrounding structures were obtained without and with intravenous contrast. CONTRAST:  Gadavist 5 mL. COMPARISON:  None. FINDINGS: Brain: No evidence for acute infarction, hemorrhage, mass lesion, hydrocephalus, or extra-axial fluid. Generalized atrophy. Moderate T2 and FLAIR hyperintensities in the white matter, likely small vessel disease. Chronic BILATERAL cerebellar infarcts. Post infusion, no abnormal enhancement of the brain or meninges. Vascular: Flow voids are maintained throughout the carotid, basilar, and vertebral arteries. There are no areas of chronic hemorrhage. RIGHT vertebral dominant. Skull and upper cervical spine: Unremarkable visualized calvarium, skullbase, and cervical vertebrae.  Pituitary, pineal, cerebellar tonsils unremarkable. No upper cervical cord lesions. Suspect C4-C5 spondylosis. Sinuses/Orbits: No orbital masses or proptosis. Globes appear symmetric. Sinuses appear well aerated, without evidence for air-fluid level. Other: None. IMPRESSION: Atrophy and small vessel disease, not unexpected for age. No acute intracranial findings. No evidence for metastatic disease to the brain or surrounding structures. Electronically Signed   By: Staci Righter M.D.   On: 04/11/2018 15:50    Procedures Procedures (including critical care time)  Medications Ordered in ED Medications  0.9 %  sodium chloride infusion ( Intravenous New Bag/Given 04/11/18 1933)  sodium chloride 0.9 % bolus 500 mL (0 mLs Intravenous Stopped 04/11/18 2027)     Initial Impression / Assessment and Plan / ED Course  I have reviewed the triage vital signs and the nursing notes.  Pertinent labs & imaging results that were available during my care of the patient were reviewed by me and considered in my medical decision making (see chart for details).        Lab findings and chest x-ray MRI discussed with [name] from hematology oncology.  Patient with slight increase in in BUN but no significant dehydration.  Patient was given a 500 cc bolus of normal saline and was receiving 75 cc an hour.  MRI from earlier today was negative for any brain abnormality.  Chest x-ray here today was negative.  Urinalysis not consistent with urinary tract infection but there were a few white blood cells.  So it was sent for culture just to be extra careful.  Patient clinically has been alert here.  Has required a little bit redirection but has not been inappropriate.  [name] felt that patient was stable for discharge home.  No objective indications for admission.  I contacted patient's son somewhat resistant for her to go back home but I explained we had no indications for admission and had run everything by Dr.  Alen Blew.  We feel that perhaps maybe there is some increasing dementia.  But nothing that warrants admission.  Patient stable for discharge home.  Son will bring her back for anything new or worse.  We will follow-up with primary care doctor and will follow-up with [name].  In addition patient's blood pressure has been elevated here.  She does have a history of hypertension and is on long-acting beta-blocker.  Initial pressures when she first came in were fine.  But not sure of her long-acting medication has worn off.  Follow-up with primary care doctor for the blood pressure would be important.  Patient without any fevers.  Patient's oxygen saturations have been  good.  No cough or any signs concerning for upper respiratory infection.  Final Clinical Impressions(s) / ED Diagnoses   Final diagnoses:  Confusion  Dementia without behavioral disturbance, unspecified dementia type [facility])    ED Discharge Orders    None       Fredia Sorrow, MD 04/11/18 2210

## 2018-04-11 NOTE — ED Triage Notes (Signed)
Pt presents to the ED confused and stating she has a brain tumor. Pt is agitated and not answering questions appropriately. [name] told patient's son to take her to the ER to be seen.  Pt recently had an MRI of the brain per the son.  Patient's son Allison Quarry can be reached at [phone]

## 2018-04-11 NOTE — Discharge Instructions (Signed)
Lab findings here today along with chest x-ray discussed with [name].  He felt there was no indication for admission patient was cleared for discharge home.  We do realize that there has been some increased confusion but we could not find an objective reason for that.  Diabetes extra careful urine was sent for culture.  Recommend follow-up with PCP or back with [name].

## 2018-04-11 NOTE — ED Notes (Signed)
(  Son)  [phone]

## 2018-04-11 NOTE — Telephone Encounter (Signed)
She needs to go to ED

## 2018-04-11 NOTE — ED Notes (Signed)
Called lab to add urine culture.  

## 2018-04-11 NOTE — ED Notes (Signed)
Son called to pick up patient.

## 2018-04-11 NOTE — Telephone Encounter (Signed)
"  [name]'s son and caregiver Allison Quarry [phone]).  Completed MRI.  Will she be admitted today or is she to go home in [place]?  Do not want to go to the ED.  She's confused.  She can't dress herself or feed herself.  Not eating much.  Argues with me that she has eaten but has not.  Keeps taking her clothes off and on again and again."

## 2018-04-11 NOTE — ED Notes (Addendum)
Per patient's son patient has been sleeping more than usual, eating and drinking less than normal. Son's number [phone].

## 2018-04-12 ENCOUNTER — Other Ambulatory Visit: Payer: Self-pay | Admitting: Oncology

## 2018-04-12 LAB — URINE CULTURE

## 2018-04-12 NOTE — Progress Notes (Signed)
Events in the last few days noted.  MRI of the brain as well as laboratory testing and urine analysis was reviewed from 04/11/2018.  These results were also discussed with the patient's son [name] over the phone.  He is still concerned that his mother is still confused.  She does fluctuate and waxes and wanes in her level of confusion.  She recognizes them at times but also has reported occasional hallucination.  MRI of the brain did not show any evidence of metastasis but her liver function test are mildly elevated which could be an indication of progressive hepatic metastasis that causes increase in her ammonia.  The etiology of the symptoms are unclear.  She could also have early signs of dementia that has been exacerbated by her recent cancer, cancer operation possible hepatic progression.  I recommended to her son [name] if her symptoms do not improve to bring her again to the emergency department for potential admission and medical work-up.

## 2018-04-13 ENCOUNTER — Inpatient Hospital Stay (HOSPITAL_COMMUNITY)
Admission: EM | Admit: 2018-04-13 | Discharge: 2018-04-16 | DRG: 640 | Disposition: A | Discharge status: Home Health | Payer: Medicare Other | Attending: Internal Medicine | Admitting: Internal Medicine

## 2018-04-13 ENCOUNTER — Telehealth: Payer: Self-pay | Admitting: *Deleted

## 2018-04-13 ENCOUNTER — Ambulatory Visit (HOSPITAL_COMMUNITY): Payer: Medicare Other

## 2018-04-13 ENCOUNTER — Emergency Department (HOSPITAL_COMMUNITY): Payer: Medicare Other

## 2018-04-13 ENCOUNTER — Other Ambulatory Visit: Payer: Self-pay

## 2018-04-13 ENCOUNTER — Encounter (HOSPITAL_COMMUNITY): Payer: Self-pay | Admitting: *Deleted

## 2018-04-13 ENCOUNTER — Inpatient Hospital Stay: Payer: Medicare Other

## 2018-04-13 DIAGNOSIS — Z9841 Cataract extraction status, right eye: Secondary | ICD-10-CM

## 2018-04-13 DIAGNOSIS — Z96641 Presence of right artificial hip joint: Secondary | ICD-10-CM | POA: Diagnosis present

## 2018-04-13 DIAGNOSIS — N179 Acute kidney failure, unspecified: Secondary | ICD-10-CM | POA: Diagnosis present

## 2018-04-13 DIAGNOSIS — I2510 Atherosclerotic heart disease of native coronary artery without angina pectoris: Secondary | ICD-10-CM | POA: Diagnosis present

## 2018-04-13 DIAGNOSIS — E785 Hyperlipidemia, unspecified: Secondary | ICD-10-CM | POA: Diagnosis present

## 2018-04-13 DIAGNOSIS — I700 Atherosclerosis of aorta: Secondary | ICD-10-CM | POA: Diagnosis present

## 2018-04-13 DIAGNOSIS — C679 Malignant neoplasm of bladder, unspecified: Secondary | ICD-10-CM | POA: Diagnosis present

## 2018-04-13 DIAGNOSIS — R443 Hallucinations, unspecified: Secondary | ICD-10-CM | POA: Diagnosis present

## 2018-04-13 DIAGNOSIS — M810 Age-related osteoporosis without current pathological fracture: Secondary | ICD-10-CM | POA: Diagnosis present

## 2018-04-13 DIAGNOSIS — E861 Hypovolemia: Secondary | ICD-10-CM | POA: Diagnosis present

## 2018-04-13 DIAGNOSIS — Z96651 Presence of right artificial knee joint: Secondary | ICD-10-CM | POA: Diagnosis present

## 2018-04-13 DIAGNOSIS — Z882 Allergy status to sulfonamides status: Secondary | ICD-10-CM

## 2018-04-13 DIAGNOSIS — I10 Essential (primary) hypertension: Secondary | ICD-10-CM | POA: Diagnosis not present

## 2018-04-13 DIAGNOSIS — G9341 Metabolic encephalopathy: Secondary | ICD-10-CM | POA: Diagnosis present

## 2018-04-13 DIAGNOSIS — M797 Fibromyalgia: Secondary | ICD-10-CM | POA: Diagnosis present

## 2018-04-13 DIAGNOSIS — F329 Major depressive disorder, single episode, unspecified: Secondary | ICD-10-CM | POA: Diagnosis present

## 2018-04-13 DIAGNOSIS — Z823 Family history of stroke: Secondary | ICD-10-CM

## 2018-04-13 DIAGNOSIS — G9340 Encephalopathy, unspecified: Secondary | ICD-10-CM | POA: Diagnosis not present

## 2018-04-13 DIAGNOSIS — G8929 Other chronic pain: Secondary | ICD-10-CM | POA: Diagnosis present

## 2018-04-13 DIAGNOSIS — Z91040 Latex allergy status: Secondary | ICD-10-CM

## 2018-04-13 DIAGNOSIS — Z905 Acquired absence of kidney: Secondary | ICD-10-CM

## 2018-04-13 DIAGNOSIS — Z9221 Personal history of antineoplastic chemotherapy: Secondary | ICD-10-CM

## 2018-04-13 DIAGNOSIS — N289 Disorder of kidney and ureter, unspecified: Secondary | ICD-10-CM

## 2018-04-13 DIAGNOSIS — R945 Abnormal results of liver function studies: Secondary | ICD-10-CM | POA: Diagnosis present

## 2018-04-13 DIAGNOSIS — K219 Gastro-esophageal reflux disease without esophagitis: Secondary | ICD-10-CM | POA: Diagnosis present

## 2018-04-13 DIAGNOSIS — E038 Other specified hypothyroidism: Secondary | ICD-10-CM

## 2018-04-13 DIAGNOSIS — Z85038 Personal history of other malignant neoplasm of large intestine: Secondary | ICD-10-CM

## 2018-04-13 DIAGNOSIS — Z79890 Hormone replacement therapy (postmenopausal): Secondary | ICD-10-CM

## 2018-04-13 DIAGNOSIS — Z79899 Other long term (current) drug therapy: Secondary | ICD-10-CM

## 2018-04-13 DIAGNOSIS — H3530 Unspecified macular degeneration: Secondary | ICD-10-CM | POA: Diagnosis present

## 2018-04-13 DIAGNOSIS — F0390 Unspecified dementia without behavioral disturbance: Secondary | ICD-10-CM | POA: Diagnosis present

## 2018-04-13 DIAGNOSIS — Z9049 Acquired absence of other specified parts of digestive tract: Secondary | ICD-10-CM

## 2018-04-13 DIAGNOSIS — M1990 Unspecified osteoarthritis, unspecified site: Secondary | ICD-10-CM | POA: Diagnosis present

## 2018-04-13 DIAGNOSIS — Z961 Presence of intraocular lens: Secondary | ICD-10-CM | POA: Diagnosis present

## 2018-04-13 DIAGNOSIS — E860 Dehydration: Principal | ICD-10-CM | POA: Diagnosis present

## 2018-04-13 DIAGNOSIS — R4182 Altered mental status, unspecified: Secondary | ICD-10-CM

## 2018-04-13 DIAGNOSIS — E871 Hypo-osmolality and hyponatremia: Secondary | ICD-10-CM | POA: Diagnosis not present

## 2018-04-13 DIAGNOSIS — Z7982 Long term (current) use of aspirin: Secondary | ICD-10-CM

## 2018-04-13 DIAGNOSIS — Z833 Family history of diabetes mellitus: Secondary | ICD-10-CM

## 2018-04-13 DIAGNOSIS — Z9842 Cataract extraction status, left eye: Secondary | ICD-10-CM

## 2018-04-13 DIAGNOSIS — R7989 Other specified abnormal findings of blood chemistry: Secondary | ICD-10-CM | POA: Diagnosis present

## 2018-04-13 DIAGNOSIS — Z8553 Personal history of malignant neoplasm of renal pelvis: Secondary | ICD-10-CM

## 2018-04-13 DIAGNOSIS — C787 Secondary malignant neoplasm of liver and intrahepatic bile duct: Secondary | ICD-10-CM | POA: Diagnosis present

## 2018-04-13 DIAGNOSIS — E1151 Type 2 diabetes mellitus with diabetic peripheral angiopathy without gangrene: Secondary | ICD-10-CM | POA: Diagnosis present

## 2018-04-13 DIAGNOSIS — I160 Hypertensive urgency: Secondary | ICD-10-CM | POA: Diagnosis not present

## 2018-04-13 DIAGNOSIS — E039 Hypothyroidism, unspecified: Secondary | ICD-10-CM | POA: Diagnosis present

## 2018-04-13 DIAGNOSIS — Z888 Allergy status to other drugs, medicaments and biological substances status: Secondary | ICD-10-CM

## 2018-04-13 DIAGNOSIS — Z807 Family history of other malignant neoplasms of lymphoid, hematopoietic and related tissues: Secondary | ICD-10-CM

## 2018-04-13 DIAGNOSIS — Z8249 Family history of ischemic heart disease and other diseases of the circulatory system: Secondary | ICD-10-CM

## 2018-04-13 LAB — URINALYSIS, ROUTINE W REFLEX MICROSCOPIC
Bilirubin Urine: NEGATIVE
Glucose, UA: NEGATIVE mg/dL
Hgb urine dipstick: NEGATIVE
Ketones, ur: NEGATIVE mg/dL
Nitrite: NEGATIVE
PH: 6 (ref 5.0–8.0)
Protein, ur: NEGATIVE mg/dL
Specific Gravity, Urine: 1.015 (ref 1.005–1.030)

## 2018-04-13 LAB — CBC WITH DIFFERENTIAL/PLATELET
Abs Immature Granulocytes: 0.06 10*3/uL (ref 0.00–0.07)
BASOS PCT: 0 %
Basophils Absolute: 0 10*3/uL (ref 0.0–0.1)
EOS ABS: 0.2 10*3/uL (ref 0.0–0.5)
Eosinophils Relative: 2 %
HCT: 39 % (ref 36.0–46.0)
Hemoglobin: 12.1 g/dL (ref 12.0–15.0)
Immature Granulocytes: 1 %
Lymphocytes Relative: 14 %
Lymphs Abs: 1.4 10*3/uL (ref 0.7–4.0)
MCH: 31.2 pg (ref 26.0–34.0)
MCHC: 31 g/dL (ref 30.0–36.0)
MCV: 100.5 fL — ABNORMAL HIGH (ref 80.0–100.0)
Monocytes Absolute: 1.1 10*3/uL — ABNORMAL HIGH (ref 0.1–1.0)
Monocytes Relative: 11 %
Neutro Abs: 7.3 10*3/uL (ref 1.7–7.7)
Neutrophils Relative %: 72 %
PLATELETS: 441 10*3/uL — AB (ref 150–400)
RBC: 3.88 MIL/uL (ref 3.87–5.11)
RDW: 14.6 % (ref 11.5–15.5)
WBC: 10.1 10*3/uL (ref 4.0–10.5)
nRBC: 0 % (ref 0.0–0.2)

## 2018-04-13 LAB — COMPREHENSIVE METABOLIC PANEL
ALT: 124 U/L — ABNORMAL HIGH (ref 0–44)
AST: 110 U/L — ABNORMAL HIGH (ref 15–41)
Albumin: 3.5 g/dL (ref 3.5–5.0)
Alkaline Phosphatase: 626 U/L — ABNORMAL HIGH (ref 38–126)
Anion gap: 10 (ref 5–15)
BUN: 26 mg/dL — ABNORMAL HIGH (ref 8–23)
CO2: 26 mmol/L (ref 22–32)
Calcium: 9.6 mg/dL (ref 8.9–10.3)
Chloride: 97 mmol/L — ABNORMAL LOW (ref 98–111)
Creatinine, Ser: 1.06 mg/dL — ABNORMAL HIGH (ref 0.44–1.00)
GFR calc Af Amer: 56 mL/min — ABNORMAL LOW (ref >60)
GFR calc non Af Amer: 49 mL/min — ABNORMAL LOW (ref >60)
Glucose, Bld: 116 mg/dL — ABNORMAL HIGH (ref 70–99)
POTASSIUM: 4 mmol/L (ref 3.5–5.1)
Sodium: 133 mmol/L — ABNORMAL LOW (ref 135–145)
Total Bilirubin: 0.6 mg/dL (ref 0.3–1.2)
Total Protein: 8.4 g/dL — ABNORMAL HIGH (ref 6.5–8.1)

## 2018-04-13 LAB — AMMONIA: Ammonia: 34 umol/L (ref 9–35)

## 2018-04-13 MED ORDER — FAMOTIDINE 20 MG PO TABS
20.0000 mg | ORAL_TABLET | Freq: Every day | ORAL | Status: DC
  Administered 2018-04-14 – 2018-04-16 (×3): 20 mg via ORAL
  Filled 2018-04-13 (×3): qty 1

## 2018-04-13 MED ORDER — HYDROCODONE-ACETAMINOPHEN 5-325 MG PO TABS: 0.5000 | ORAL_TABLET | Freq: Three times a day (TID) | ORAL | Status: DC | PRN

## 2018-04-13 MED ORDER — PANTOPRAZOLE SODIUM 40 MG PO TBEC
40.0000 mg | DELAYED_RELEASE_TABLET | Freq: Every day | ORAL | Status: DC
  Administered 2018-04-14 – 2018-04-16 (×3): 40 mg via ORAL
  Filled 2018-04-13 (×3): qty 1

## 2018-04-13 MED ORDER — LEVOTHYROXINE SODIUM 100 MCG PO TABS
200.0000 ug | ORAL_TABLET | Freq: Every day | ORAL | Status: DC
  Administered 2018-04-14 – 2018-04-16 (×3): 200 ug via ORAL
  Filled 2018-04-13 (×3): qty 2

## 2018-04-13 MED ORDER — SODIUM CHLORIDE 0.9% FLUSH
3.0000 mL | Freq: Two times a day (BID) | INTRAVENOUS | Status: DC
  Administered 2018-04-14 – 2018-04-15 (×2): 3 mL via INTRAVENOUS

## 2018-04-13 MED ORDER — SODIUM CHLORIDE 0.9 % IV SOLN
INTRAVENOUS | Status: AC
  Administered 2018-04-14: 01:00:00 via INTRAVENOUS

## 2018-04-13 MED ORDER — METOPROLOL SUCCINATE ER 50 MG PO TB24
50.0000 mg | ORAL_TABLET | Freq: Every day | ORAL | Status: DC
  Administered 2018-04-13 – 2018-04-16 (×4): 50 mg via ORAL
  Filled 2018-04-13 (×4): qty 1

## 2018-04-13 MED ORDER — SODIUM CHLORIDE 0.9 % IV SOLN
1.0000 g | Freq: Once | INTRAVENOUS | Status: AC
  Administered 2018-04-13: 1 g via INTRAVENOUS
  Filled 2018-04-13: qty 10

## 2018-04-13 MED ORDER — ASPIRIN EC 81 MG PO TBEC
81.0000 mg | DELAYED_RELEASE_TABLET | Freq: Every day | ORAL | Status: DC
  Administered 2018-04-14 – 2018-04-16 (×3): 81 mg via ORAL
  Filled 2018-04-13 (×3): qty 1

## 2018-04-13 MED ORDER — LABETALOL HCL 5 MG/ML IV SOLN
5.0000 mg | INTRAVENOUS | Status: DC | PRN
  Administered 2018-04-14: 5 mg via INTRAVENOUS
  Filled 2018-04-13 (×3): qty 4

## 2018-04-13 MED ORDER — ACETAMINOPHEN 325 MG PO TABS
650.0000 mg | ORAL_TABLET | Freq: Four times a day (QID) | ORAL | Status: DC | PRN
  Administered 2018-04-14: 650 mg via ORAL
  Filled 2018-04-13: qty 2

## 2018-04-13 MED ORDER — HEPARIN SODIUM (PORCINE) 5000 UNIT/ML IJ SOLN
5000.0000 [IU] | Freq: Three times a day (TID) | INTRAMUSCULAR | Status: DC
  Administered 2018-04-14 – 2018-04-16 (×6): 5000 [IU] via SUBCUTANEOUS
  Filled 2018-04-13 (×7): qty 1

## 2018-04-13 MED ORDER — ONDANSETRON HCL 4 MG PO TABS
4.0000 mg | ORAL_TABLET | Freq: Four times a day (QID) | ORAL | Status: DC | PRN
  Administered 2018-04-15: 4 mg via ORAL
  Filled 2018-04-13: qty 1

## 2018-04-13 MED ORDER — ACETAMINOPHEN 650 MG RE SUPP: 650.0000 mg | Freq: Four times a day (QID) | RECTAL | Status: DC | PRN

## 2018-04-13 MED ORDER — SODIUM CHLORIDE 0.9 % IV SOLN
INTRAVENOUS | Status: DC
  Administered 2018-04-13: 20:00:00 via INTRAVENOUS

## 2018-04-13 MED ORDER — ONDANSETRON HCL 4 MG/2ML IJ SOLN: 4.0000 mg | Freq: Four times a day (QID) | INTRAMUSCULAR | Status: DC | PRN

## 2018-04-13 NOTE — ED Triage Notes (Signed)
Per pt's son [name] pt was evaluated in the ER two days ago for confusion and was discharged home. He sts her symptoms are only getting worse, she is walking around naked, thinks "everyone is out to get her" . Pt's son sts he called Dr Alen Blew and was told to bring pt back to ER for admission. When asked what brought her to ER pt sts "everything is wrinkled" and would not answer any more questions. Pt's son [name] can be reached at [phone]

## 2018-04-13 NOTE — ED Notes (Signed)
ED TO INPATIENT HANDOFF REPORT  ED Nurse Name and Phone #: Tomi Likens, RN  S Name/Age/Gender [name] 85 y.o. female Room/Bed: WA10/WA10  Code Status   Code Status: Prior  Home/SNF/Other Home Patient oriented to: self, place, time and situation Is this baseline? Yes   Triage Complete: Triage complete  Chief Complaint ams  Triage Note Per pt's son [name] pt was evaluated in the ER two days ago for confusion and was discharged home. He sts her symptoms are only getting worse, she is walking around naked, thinks "everyone is out to get her" . Pt's son sts he called Dr Alen Blew and was told to bring pt back to ER for admission. When asked what brought her to ER pt sts "everything is wrinkled" and would not answer any more questions. Pt's son [name] can be reached at [phone]   Allergies Allergies  Allergen Reactions  . Tape Rash    Blisters  . Latex Rash and Other (See Comments)    blister  . Lidocaine Nausea And Vomiting  . Sulfa Antibiotics Nausea And Vomiting    Level of Care/Admitting Diagnosis ED Disposition    ED Disposition Condition Comment   Admit  Hospital Area: [facility] [[id number]]  Level of Care: Telemetry [5]  Admit to tele based on following criteria: Other see comments  Comments: acute neurologic  Diagnosis: Acute encephalopathy [[id number]]  Admitting Physician: Vianne Bulls [[id number]]  Attending Physician: Vianne Bulls [[id number]]  PT Class (Do Not Modify): Observation [104]  PT Acc Code (Do Not Modify): Observation [10022]       B Medical/Surgery History Past Medical History:  Diagnosis Date  . Abdominal pain   . Anemia   . Calcification of aorta (HCC) 11/16/2012  . Coronary artery calcification 02/14/2018   cardiologist-- dr t. Oval Linsey  . Depression   . Diabetes mellitus type 2, diet-controlled ([place])    pt denies  . DJD (degenerative joint disease)   . DM II (diabetes mellitus, type II), controlled 11/16/2012  .  Dysuria   . Epistaxis   . Family history of adverse reaction to anesthesia    sister--- ponv  . Fibromyalgia   . GERD (gastroesophageal reflux disease)   . Goiter   . Gross hematuria   . Headache(784.0)    tension headache daily per pt   . Heart murmur   . Hiatal hernia   . History of vertebral compression fracture   . Hypertension   . Hypothyroidism    endocrinologist-- dr Chalmers Cater  . Left adrenal mass ([place])   . Left renal mass   . Left thyroid nodule    hx biopsy 2013, benign  . Macular degeneration    both  left > right  . Osteoarthritis   . Osteoporosis   . Peripheral vascular disease ([place])    varicose veins in left arm   . 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 PONV (postoperative nausea and vomiting)   . Renal cell cancer, left [facility])    oncologist-- dr Alen Blew  . TMJ syndrome    right   . UTI (urinary tract infection) 02/28/2018  . Wears glasses    Past Surgical History:  Procedure Laterality Date  . APPENDECTOMY  age 19  . CATARACT EXTRACTION  W/ INTRAOCULAR LENS  IMPLANT, BILATERAL  2004  approx.  . CHOLECYSTECTOMY N/A 11/17/2012   Procedure: LAPAROSCOPIC CHOLECYSTECTOMY;  Surgeon: Leighton Ruff, MD;  Location: [facility];  Service: General;  Laterality: N/A;  . COLONOSCOPY W/ BIOPSIES  09/25/2002, 09/18/2001  . ERCP N/A 11/14/2012   Procedure: ENDOSCOPIC RETROGRADE CHOLANGIOPANCREATOGRAPHY (ERCP);  Surgeon: Jeryl Columbia, MD;  Location: [facility];  Service: Endoscopy;  Laterality: N/A;  . ESOPHAGOGASTRODUODENOSCOPY ENDOSCOPY    . KYPHOPLASTY  01/2011  . LAPAROSCOPIC RIGHT HEMI COLECTOMY  10-03-2001    dr Hassell Done @[facility]   . [place]  2000  . porta catheter placement     REMOVAL 2005  . ROBOT ASSISTED LAPAROSCOPIC NEPHRECTOMY Left 03/09/2018    Procedure: XI ROBOTIC ASSISTED LAPAROSCOPIC NEPHRECTOMY;  Surgeon: Cleon Gustin, MD;  Location: [facility];  Service: Urology;  Laterality: Left;  3 HRS  . TEMPOROMANDIBULAR JOINT SURGERY Right 1990s  . TONSILLECTOMY  child  . TOTAL HIP ARTHROPLASTY Right 05/30/2015   Procedure: TOTAL HIP ARTHROPLASTY ANTERIOR APPROACH;  Surgeon: Rod Can, MD;  Location: [facility];  Service: Orthopedics;  Laterality: Right;  . TOTAL KNEE ARTHROPLASTY  12/20/2010   Procedure: TOTAL KNEE ARTHROPLASTY;  Surgeon: Mauri Pole;  Location: [facility];  Service: Orthopedics;  Laterality: Right;  . TOTAL KNEE ARTHROPLASTY Right 12-20-2010  dr Alvan Dame @[facility]   . UPPER GASTROINTESTINAL ENDOSCOPY  09/18/2001     A IV Location/Drains/Wounds Patient Lines/Drains/Airways Status   Active Line/Drains/Airways    Name:   Placement date:   Placement time:   Site:   Days:   Peripheral IV 04/11/18 Right Antecubital   04/11/18    1930    Antecubital   2   Peripheral IV 04/13/18 Right Antecubital   04/13/18    2024    Antecubital   less than 1   Incision (Closed) 05/30/15 Thigh Right   05/30/15    2137     1049   Incision (Closed) 03/09/18 Abdomen   03/09/18    1445     35   Incision - 3 Ports Abdomen 1: Left;Upper 2: Left 3: Left;Lower   03/09/18    1300     35          Intake/Output Last 24 hours No intake or output data in the 24 hours ending 04/13/18 2249  Labs/Imaging Results for orders placed or performed during the hospital encounter of 04/13/18 (from the past 48 hour(s))  CBC with Differential/Platelet     Status: Abnormal   Collection Time: 04/13/18  8:17 PM  Result Value Ref Range   WBC 10.1 4.0 - 10.5 K/uL   RBC 3.88 3.87 - 5.11 MIL/uL   Hemoglobin 12.1 12.0 - 15.0 g/dL   HCT 39.0 36.0 - 46.0 %   MCV 100.5 (H) 80.0 - 100.0 fL   MCH 31.2 26.0 - 34.0 pg   MCHC 31.0 30.0 - 36.0 g/dL   RDW 14.6 11.5 - 15.5 %   Platelets 441 (H) 150 - 400 K/uL   nRBC 0.0 0.0 - 0.2 %   Neutrophils Relative % 72 %   Neutro Abs 7.3  1.7 - 7.7 K/uL   Lymphocytes Relative 14 %   Lymphs Abs 1.4 0.7 - 4.0 K/uL   Monocytes Relative 11 %   Monocytes Absolute 1.1 (H) 0.1 - 1.0 K/uL   Eosinophils Relative 2 %   Eosinophils Absolute 0.2 0.0 - 0.5 K/uL   Basophils Relative 0 %   Basophils Absolute 0.0 0.0 -  0.1 K/uL   Immature Granulocytes 1 %   Abs Immature Granulocytes 0.06 0.00 - 0.07 K/uL    Comment: Performed at [facility], [place] [address], [place], [place] [address]  Comprehensive metabolic panel     Status: Abnormal   Collection Time: 04/13/18  8:17 PM  Result Value Ref Range   Sodium 133 (L) 135 - 145 mmol/L   Potassium 4.0 3.5 - 5.1 mmol/L   Chloride 97 (L) 98 - 111 mmol/L   CO2 26 22 - 32 mmol/L   Glucose, Bld 116 (H) 70 - 99 mg/dL   BUN 26 (H) 8 - 23 mg/dL   Creatinine, Ser 1.06 (H) 0.44 - 1.00 mg/dL   Calcium 9.6 8.9 - 10.3 mg/dL   Total Protein 8.4 (H) 6.5 - 8.1 g/dL   Albumin 3.5 3.5 - 5.0 g/dL   AST 110 (H) 15 - 41 U/L   ALT 124 (H) 0 - 44 U/L   Alkaline Phosphatase 626 (H) 38 - 126 U/L   Total Bilirubin 0.6 0.3 - 1.2 mg/dL   GFR calc non Af Amer 49 (L) >60 mL/min   GFR calc Af Amer 56 (L) >60 mL/min   Anion gap 10 5 - 15    Comment: Performed at [facility], [place] [address], [place], [place] [address]  Ammonia     Status: None   Collection Time: 04/13/18  8:23 PM  Result Value Ref Range   Ammonia 34 9 - 35 umol/L    Comment: Performed at [facility], [place] [address] [place], [place] [address]  Urinalysis, Routine w reflex microscopic     Status: Abnormal   Collection Time: 04/13/18  8:23 PM  Result Value Ref Range   Color, Urine YELLOW YELLOW   APPearance CLEAR CLEAR   Specific Gravity, Urine 1.015 1.005 - 1.030   pH 6.0 5.0 - 8.0   Glucose, UA NEGATIVE NEGATIVE mg/dL   Hgb urine dipstick NEGATIVE NEGATIVE   Bilirubin Urine NEGATIVE NEGATIVE   Ketones, ur NEGATIVE NEGATIVE mg/dL   Protein, ur NEGATIVE NEGATIVE mg/dL   Nitrite  NEGATIVE NEGATIVE   Leukocytes,Ua MODERATE (A) NEGATIVE   RBC / HPF 0-5 0 - 5 RBC/hpf   WBC, UA 11-20 0 - 5 WBC/hpf   Bacteria, UA RARE (A) NONE SEEN   Squamous Epithelial / LPF 0-5 0 - 5   Mucus PRESENT    Hyaline Casts, UA PRESENT     Comment: Performed at [facility], [place] [address] [place],  [address]   Dg Chest 2 View  Result Date: 04/13/2018 CLINICAL DATA:  AMS EXAM: CHEST - 2 VIEW COMPARISON:  04/11/2018 FINDINGS: The heart size and mediastinal contours are within normal limits. Both lungs are clear. The visualized skeletal structures are unremarkable. IMPRESSION: No acute abnormality of the lungs.  No focal airspace opacity. Electronically Signed   By: Eddie Candle M.D.   On: 04/13/2018 20:47    Pending Labs Unresulted Labs (From admission, onward)    Start     Ordered   04/13/18 1843  Urine Culture  ONCE - STAT,   STAT     04/13/18 1842          Vitals/Pain Today's Vitals   04/13/18 1745 04/13/18 2053 04/13/18 2230  BP: (!) 154/100 (!) 174/103 (!) 181/111  Pulse: 87 92 90  Resp: 18 18 18   Temp: 98.4 F (36.9 C)  98.2 F (36.8 C)  TempSrc: Oral  Oral  SpO2: 97% 100% 98%    Isolation Precautions No active isolations  Medications Medications  0.9 %  sodium chloride infusion ( Intravenous New Bag/Given 04/13/18 2025)  cefTRIAXone (ROCEPHIN) 1 g in sodium chloride 0.9 % 100 mL IVPB (1 g Intravenous New Bag/Given 04/13/18 2229)  metoprolol succinate (TOPROL-XL) 24 hr tablet 50 mg (has no administration in time range)    Mobility walks with person assist High fall risk   Focused Assessments    R Recommendations: See Admitting Provider Note  Report given to:   Additional Notes:  Hx of dementia but pt A&Ox4 in [facility] ED

## 2018-04-13 NOTE — ED Provider Notes (Signed)
[facility] DEPT Provider Note   CSN: [id number] Arrival date & time: 04/13/18  1731    History   Chief Complaint Chief Complaint  Patient presents with   Altered Mental Status   Level 5 caveat due to altered mental status. HPI [name] is a 85 y.o. 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  Seen in the ED on 04/11/2018 for the same brought in by her son who has been conversing with the patient's oncologist [name].  Per that note the patient had been agitated not answering questions and sleeping more than usual.  At that time work-up showed negative MRI no evidence of brain abnormality or stroke or metastatic disease.  UA negative for UTI but was cultured and showed multiple species present.  Her BUN was slightly increased from baseline and so she was given a small fluid bolus.  Per note from [name] yesterday, the patient's son is concerned that his mother is still confused with waxing and waning lucid intervals.  Apparently has the occasional hallucinations as well.  He recommended presentation to the ED for potential admission and medical work-up.  On my assessment the patient is resting comfortably in bed.  She is oriented to person place and time.  She otherwise does appear confused and is having a hard time answering most questions.  She does not know why she is here.  She is fidgeting with a plastic bag in her lap.     The history is provided by the patient and medical records. The history is limited by the condition of the patient.    Past Medical History:  Diagnosis Date   Abdominal pain    Anemia    Calcification of aorta ([place]) 11/16/2012   Coronary artery calcification 02/14/2018   cardiologist-- dr t. Oval Linsey    Depression    Diabetes mellitus type 2, diet-controlled ([place])    pt denies   DJD (degenerative joint disease)    DM II (diabetes mellitus, type II), controlled 11/16/2012   Dysuria    Epistaxis    Family history of adverse reaction to anesthesia    sister--- ponv   Fibromyalgia    GERD (gastroesophageal reflux disease)    Goiter    Gross hematuria    Headache(784.0)    tension headache daily per pt    Heart murmur    Hiatal hernia    History of vertebral compression fracture    Hypertension    Hypothyroidism    endocrinologist-- dr balan   Left adrenal mass [facility])    Left renal mass    Left thyroid nodule    hx biopsy 2013, benign   Macular degeneration    both  left > right   Osteoarthritis    Osteoporosis    Peripheral vascular disease ([place])    varicose veins in left arm    Personal history of colon cancer, stage III oncologist-- dr Alvy Bimler (note in epic no recurrence)   2003---  s/p  right hemicolectomy 10-03-2001  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;  and completed chemo 04-2002  (Stage IIIB, pT3 N1 M0)   PONV (postoperative nausea and vomiting)    Renal cell cancer, left [facility])    oncologist-- dr Alen Blew   TMJ syndrome    right    UTI (urinary tract infection)  02/28/2018   Wears glasses     Patient Active Problem List   Diagnosis Date Noted   Acute encephalopathy 04/13/2018   Mild renal insufficiency 04/13/2018   LFTs abnormal 04/13/2018   Hypertensive urgency 04/13/2018   Protein-calorie malnutrition, severe 03/12/2018   Renal mass 03/09/2018   Coronary artery calcification 02/14/2018   Near syncope 02/02/2018   Orthostatic hypotension 02/02/2018   Anemia 02/02/2018   Suprapubic discomfort 07/06/2017   Vitamin D deficiency 08/19/2015   Closed right hip fracture ([place]) 05/30/2015   Chest pain syndrome 05/30/2015   Fall at home 05/30/2015   Closed right femoral fracture ([place]) 05/30/2015    Displaced fracture of right femoral neck ([place]) 05/30/2015   Fall    Cough 07/02/2013   Other dyspnea and respiratory abnormality 07/02/2013   History of colon cancer, stage III 07/02/2013   Chronic cholecystitis with calculus s/p lap chole 11/17/2012 11/19/2012   Hyponatremia 11/16/2012   GERD (gastroesophageal reflux disease) 11/16/2012   Choledocholithiasis with obstruction 11/16/2012   Calcification of aorta ([place]) 11/16/2012   Atherosclerosis 11/16/2012   Hypothyroidism 11/15/2012   Nonspecific (abnormal) findings on radiological and other examination of gastrointestinal tract 11/08/2012   S/P right knee replacement 12/22/2010   Preop cardiovascular exam 12/08/2010   Essential hypertension 12/08/2010   Hyperlipidemia 12/08/2010    Past Surgical History:  Procedure Laterality Date   APPENDECTOMY  age 79   CATARACT EXTRACTION W/ [place], BILATERAL  2004  approx.   CHOLECYSTECTOMY N/A 11/17/2012   Procedure: LAPAROSCOPIC CHOLECYSTECTOMY;  Surgeon: Leighton Ruff, MD;  Location: [facility] ORS;  Service: General;  Laterality: N/A;   COLONOSCOPY W/ BIOPSIES  09/25/2002, 09/18/2001   ERCP N/A 11/14/2012   Procedure: ENDOSCOPIC RETROGRADE CHOLANGIOPANCREATOGRAPHY (ERCP);  Surgeon: Jeryl Columbia, MD;  Location: [facility];  Service: Endoscopy;  Laterality: N/A;   ESOPHAGOGASTRODUODENOSCOPY ENDOSCOPY     KYPHOPLASTY  01/2011   LAPAROSCOPIC RIGHT HEMI COLECTOMY  10-03-2001    dr Hassell Done @[facility]    LUMBAR SPINE SURGERY  2000   porta catheter placement     REMOVAL 2005   ROBOT ASSISTED LAPAROSCOPIC NEPHRECTOMY Left 03/09/2018   Procedure: XI ROBOTIC ASSISTED LAPAROSCOPIC NEPHRECTOMY;  Surgeon: Cleon Gustin, MD;  Location: [facility];  Service: Urology;  Laterality: Left;  3 HRS   TEMPOROMANDIBULAR JOINT SURGERY Right 1990s   TONSILLECTOMY  child   TOTAL HIP ARTHROPLASTY Right 05/30/2015   Procedure: TOTAL HIP ARTHROPLASTY ANTERIOR APPROACH;  Surgeon: Rod Can, MD;  Location: [facility];  Service: Orthopedics;  Laterality: Right;   TOTAL KNEE ARTHROPLASTY  12/20/2010   Procedure: TOTAL KNEE ARTHROPLASTY;  Surgeon: Mauri Pole;  Location: [facility];  Service: Orthopedics;  Laterality: Right;   TOTAL KNEE ARTHROPLASTY Right 12-20-2010  dr Alvan Dame @[facility]    UPPER GASTROINTESTINAL ENDOSCOPY  09/18/2001     OB History    Gravida  1   Para  1   Term  1   Preterm  0   AB  0   Living  1     SAB  0   TAB  0   Ectopic  0   Multiple  0   Live Births  1            Home Medications    Prior to Admission medications   Medication Sig Start Date End Date Taking? Authorizing Provider  acetaminophen (TYLENOL 8 HOUR ARTHRITIS PAIN) 650 MG CR tablet Take 1,300 mg by mouth 2 (two) times daily.  Yes [provider]  aspirin EC 81 MG tablet Take 81 mg by mouth daily.   Yes [provider]  Calcium-Vitamin D (CALTRATE 600 PLUS-VIT D PO) Take 1 tablet by mouth 2 (two) times daily.    Yes [provider]  docusate sodium (COLACE) 100 MG capsule Take 300 mg by mouth daily.    Yes [provider]  esomeprazole (NEXIUM) 40 MG capsule Take 40 mg by mouth 2 (two) times daily at 8 am and 10 pm.    Yes [provider]  estradiol (ESTRACE) 0.5 MG tablet Take 0.25 mg by mouth every morning.  05/16/16  Yes [provider]  famotidine (PEPCID) 20 MG tablet Take 20 mg by mouth daily before breakfast.    Yes [provider]  ferrous sulfate 325 (65 FE) MG tablet Take 325 mg by mouth 2 (two) times daily.   Yes [provider]  levothyroxine (SYNTHROID, LEVOTHROID) 200 MCG tablet Take 200 mcg by mouth daily before breakfast.   Yes [provider]  medroxyPROGESTERone (PROVERA) 5 MG tablet Take 2.5 mg by mouth every morning.    Yes [provider]  metoprolol succinate (TOPROL-XL) 50 MG 24 hr tablet Take 50 mg by mouth every morning.    Yes [provider]  Multiple  Vitamins-Minerals (CENTRUM SILVER ULTRA WOMENS PO) Take 1 tablet by mouth every evening.    Yes [provider]  Multiple Vitamins-Minerals (PRESERVISION AREDS PO) Take 2 capsules by mouth daily.    Yes [provider]  nortriptyline (PAMELOR) 25 MG capsule Take 75 mg by mouth daily.    Yes [provider]  Polyethyl Glycol-Propyl Glycol (SYSTANE ULTRA OP) Place 2 drops into both eyes at bedtime.   Yes [provider]  simvastatin (ZOCOR) 20 MG tablet Take 20 mg by mouth every evening.    Yes [provider]  vitamin B-12 (CYANOCOBALAMIN) 1000 MCG tablet Take 1,000 mcg by mouth daily.   Yes [provider]  vitamin C (ASCORBIC ACID) 500 MG tablet Take 500 mg by mouth daily.    Yes [provider]  Vitamin D, Ergocalciferol, (DRISDOL) 50000 units CAPS capsule take ONE CAPSULE BY MOUTH every 7 DAYS Patient taking differently: Take 50,000 Units by mouth every 30 (thirty) days. 1st day of each month 08/19/15  Yes Kem Boroughs, FNP  vitamin E 400 UNIT capsule Take 800 Units by mouth daily.    Yes [provider]  zolpidem (AMBIEN) 10 MG tablet Take 1 tablet (10 mg total) by mouth at bedtime. 06/01/15  Yes Theodis Blaze, MD  amoxicillin (AMOXIL) 500 MG capsule Take 1 capsule (500 mg total) by mouth 3 (three) times daily. Patient not taking: Reported on 04/11/2018 02/01/18   Rolland Porter, MD  HYDROcodone-acetaminophen (NORCO/VICODIN) 5-325 MG tablet Take 2 tablets by mouth every 12 (twelve) hours as needed for severe pain. Patient not taking: Reported on 04/11/2018 03/04/18   Virgel Manifold, MD  traMADol (ULTRAM) 50 MG tablet Take 1 tablet (50 mg total) by mouth every 6 (six) hours as needed. Patient not taking: Reported on 04/11/2018 03/15/18 03/15/19  Cleon Gustin, MD    Family History Family History  Problem Relation Age of Onset   Heart disease Mother        Enlarged heart   Arrhythmia Mother    Diabetes Mother    Heart  attack Mother    Heart disease Brother        MI at unknown  age   Anesthesia problems Brother    Heart disease Father    Heart attack Father    Cancer Son        non-hodgkins lymphoma   Stroke Sister    Heart attack Other        MI at age 100   Heart disease Brother        CABG   AAA (abdominal aortic aneurysm) Brother    CAD Brother     Social History Social History   Tobacco Use   Smoking status: Never Smoker   Smokeless tobacco: Never Used  Substance Use Topics   Alcohol use: No   Drug use: No     Allergies   Tape; Latex; Lidocaine; and Sulfa antibiotics   Review of Systems Review of Systems  Unable to perform ROS: Mental status change     Physical Exam Updated Vital Signs BP (!) 181/111    Pulse 94    Temp 98.2 F (36.8 C) (Oral)    Resp 18    LMP 01/17/1994    SpO2 98%   Physical Exam Vitals signs and nursing note reviewed.  Constitutional:      General: She is not in acute distress.    Appearance: She is well-developed.  HENT:     Head: Normocephalic and atraumatic.  Eyes:     General:        Right eye: No discharge.        Left eye: No discharge.     Conjunctiva/sclera: Conjunctivae normal.  Neck:     Vascular: No JVD.     Trachea: No tracheal deviation.  Cardiovascular:     Rate and Rhythm: Normal rate and regular rhythm.     Comments: 2+ radial and DP/PT pulses bilaterally, no lower extremity edema Pulmonary:     Comments: Diffuse crackles.  Speaking in full sentences without difficulty. Abdominal:     General: Abdomen is flat. A surgical scar is present. Bowel sounds are normal. There is no distension.     Palpations: Abdomen is soft.     Tenderness: There is no abdominal tenderness. There is no guarding or rebound.  Skin:    Findings: No erythema.  Neurological:     Mental Status: She is alert and oriented to person, place, and time.     Comments: Fluent speech with no evidence of dysarthria or aphasia.  No facial droop.   Follows commands without difficulty.  Oriented to person place and time.  Appears mildly confused.  Moves extremities spontaneously with intact strength.  Sensation intact to soft touch of extremities and face.  Psychiatric:        Attention and Perception: She is inattentive.        Behavior: Behavior normal. Behavior is cooperative.     Comments: Fidgeting.  Does not appear to be responding to internal stimuli      ED Treatments / Results  Labs (all labs ordered are listed, but only abnormal results are displayed) Labs Reviewed  CBC WITH DIFFERENTIAL/PLATELET - Abnormal; Notable for the following components:      Result Value   MCV 100.5 (*)    Platelets 441 (*)    Monocytes Absolute 1.1 (*)    All other components within normal limits  COMPREHENSIVE METABOLIC PANEL - Abnormal; Notable for the following components:   Sodium 133 (*)    Chloride 97 (*)    Glucose, Bld 116 (*)    BUN 26 (*)    Creatinine,  Ser 1.06 (*)    Total Protein 8.4 (*)    AST 110 (*)    ALT 124 (*)    Alkaline Phosphatase 626 (*)    GFR calc non Af Amer 49 (*)    GFR calc Af Amer 56 (*)    All other components within normal limits  URINALYSIS, ROUTINE W REFLEX MICROSCOPIC - Abnormal; Notable for the following components:   Leukocytes,Ua MODERATE (*)    Bacteria, UA RARE (*)    All other components within normal limits  URINE CULTURE  AMMONIA    EKG None  Radiology Dg Chest 2 View  Result Date: 04/13/2018 CLINICAL DATA:  AMS EXAM: CHEST - 2 VIEW COMPARISON:  04/11/2018 FINDINGS: The heart size and mediastinal contours are within normal limits. Both lungs are clear. The visualized skeletal structures are unremarkable. IMPRESSION: No acute abnormality of the lungs.  No focal airspace opacity. Electronically Signed   By: Eddie Candle M.D.   On: 04/13/2018 20:47    Procedures Procedures (including critical care time)  Medications Ordered in ED Medications  0.9 %  sodium chloride infusion (  Intravenous New Bag/Given 04/13/18 2025)  metoprolol succinate (TOPROL-XL) 24 hr tablet 50 mg (50 mg Oral Given 04/13/18 2313)  cefTRIAXone (ROCEPHIN) 1 g in sodium chloride 0.9 % 100 mL IVPB (0 g Intravenous Stopped 04/13/18 2315)     Initial Impression / Assessment and Plan / ED Course  I have reviewed the triage vital signs and the nursing notes.  Pertinent labs & imaging results that were available during my care of the patient were reviewed by me and considered in my medical decision making (see chart for details).        Patient presenting for persistently altered mental status.  She is afebrile, hypertensive in the ED.  Vital signs otherwise stable.  She is nontoxic in appearance resting comfortably in bed.  Physical examination reassuring.  She does have some adventitious breath sounds but chest x-ray shows no evidence of acute cardiopulmonary abnormality and her SPO2 saturations are stable.  No evidence of CVA.  Lab work significant for no leukocytosis, no anemia.  Mildly elevated creatinine and BUN.  Worsening LFTs.  UA suggest possible UTI, will culture and start on Rocephin.  Patient does not appear to be septic at this time.  Spoke with Dr. Myna Hidalgo with [facility] hospitalist service who agrees to assume care of patient and bring her into the hospital for further evaluation and management.  Patient seen and evaluate by Dr. Zenia Resides who agrees with assessment and plan at this time.  Final Clinical Impressions(s) / ED Diagnoses   Final diagnoses:  Altered mental status, unspecified altered mental status type    ED Discharge Orders    None       Debroah Baller 04/13/18 2331    Lacretia Leigh, MD 04/14/18 1527

## 2018-04-13 NOTE — Telephone Encounter (Signed)
Spoke with son randy. Re: patient's altered mental status. Per dr Alen Blew he is to take her to the E.R.

## 2018-04-13 NOTE — ED Provider Notes (Signed)
Medical screening examination/treatment/procedure(s) were conducted as a shared visit with non-physician practitioner(s) and myself.  I personally evaluated the patient during the encounter.  None 85 year old female presents with confusion after recent evaluation for same.  Had recent MRI which came back okay.  Urinalysis does show some infection at this time.  Will be started on antibiotics and admitted to the hospital   Lacretia Leigh, MD 04/13/18 2219

## 2018-04-13 NOTE — H&P (Signed)
History and Physical    [name] LNL:[id number] DOB: [birth year] DOA: 04/13/2018  PCP: [name], MD   Patient coming from: Home   Chief Complaint: Confusion, paranoia, hallucinations   HPI: [name] is a 85 y.o. female with medical history significant for hypertension, depression, thyroidism, chronic pain, and urothelial carcinoma status post left nephrectomy, now presenting to the emergency department for evaluation of progressive confusion.  Patient's family has been concerned for increasing confusion with paranoia and apparent hallucinations.  Patient suspects family members of trying to harm her, has had waxing and waning confusion that has been overall worsening, and was evaluated in the emergency department 2 days ago where she had a negative MRI brain and returned home.  Patient denies any fevers, chest pain, abdominal pain, cough, or shortness of breath.  Patient is worried that her husband is in the emergency department and trying to harm her.  ED Course: Upon arrival to the ED, patient is found to be afebrile, saturating well on room air, and hypertensive to 175/100.  Chest x-ray is negative for acute cardiopulmonary disease.  Chemistry panel features a mild hyponatremia, mild renal insufficiency, alkaline phosphatase 626, and transaminases in the low 100s.  CBC is notable for a chronic thrombocytosis and urinalysis features rare bacteria, moderate leukocytes, negative nitrites, and 11-20 WBC.  Urine was sent for culture and the patient was given a dose of Rocephin in the ED.  She remains hemodynamically stable and will be observed for ongoing evaluation and management.  Review of Systems:  All other systems reviewed and apart from HPI, are negative.  Past Medical History:  Diagnosis Date  . Abdominal pain   . Anemia   . Calcification of aorta (HCC) 11/16/2012  . Coronary artery calcification 02/14/2018   cardiologist-- dr t. Oval Linsey  . Depression   .  Diabetes mellitus type 2, diet-controlled ([place])    pt denies  . DJD (degenerative joint disease)   . DM II (diabetes mellitus, type II), controlled 11/16/2012  . Dysuria   . Epistaxis   . Family history of adverse reaction to anesthesia    sister--- ponv  . Fibromyalgia   . GERD (gastroesophageal reflux disease)   . Goiter   . Gross hematuria   . Headache(784.0)    tension headache daily per pt   . Heart murmur   . Hiatal hernia   . History of vertebral compression fracture   . Hypertension   . Hypothyroidism    endocrinologist-- dr Chalmers Cater  . Left adrenal mass ([place])   . Left renal mass   . Left thyroid nodule    hx biopsy 2013, benign  . Macular degeneration    both  left > right  . Osteoarthritis   . Osteoporosis   . Peripheral vascular disease ([place])    varicose veins in left arm   . 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 PONV (postoperative nausea and vomiting)   . Renal cell cancer, left [facility])    oncologist-- dr Alen Blew  . TMJ syndrome    right   . UTI (urinary tract infection) 02/28/2018  . Wears glasses     Past Surgical History:  Procedure Laterality Date  . APPENDECTOMY  age 24  . CATARACT EXTRACTION W/ INTRAOCULAR  LENS  IMPLANT, BILATERAL  2004  approx.  . CHOLECYSTECTOMY N/A 11/17/2012   Procedure: LAPAROSCOPIC CHOLECYSTECTOMY;  Surgeon: Leighton Ruff, MD;  Location: [facility];  Service: General;  Laterality: N/A;  . COLONOSCOPY W/ BIOPSIES  09/25/2002, 09/18/2001  . ERCP N/A 11/14/2012   Procedure: ENDOSCOPIC RETROGRADE CHOLANGIOPANCREATOGRAPHY (ERCP);  Surgeon: Jeryl Columbia, MD;  Location: [facility];  Service: Endoscopy;  Laterality: N/A;  . ESOPHAGOGASTRODUODENOSCOPY ENDOSCOPY    . KYPHOPLASTY  01/2011  . LAPAROSCOPIC RIGHT HEMI COLECTOMY   10-03-2001    dr Hassell Done _0   . LUMBAR SPINE SURGERY  2000  . porta catheter placement     REMOVAL 2005  . ROBOT ASSISTED LAPAROSCOPIC NEPHRECTOMY Left 03/09/2018   Procedure: XI ROBOTIC ASSISTED LAPAROSCOPIC NEPHRECTOMY;  Surgeon: Cleon Gustin, MD;  Location: [facility];  Service: Urology;  Laterality: Left;  3 HRS  . TEMPOROMANDIBULAR JOINT SURGERY Right 1990s  . TONSILLECTOMY  child  . TOTAL HIP ARTHROPLASTY Right 05/30/2015   Procedure: TOTAL HIP ARTHROPLASTY ANTERIOR APPROACH;  Surgeon: Rod Can, MD;  Location: [place];  Service: Orthopedics;  Laterality: Right;  . TOTAL KNEE ARTHROPLASTY  12/20/2010   Procedure: TOTAL KNEE ARTHROPLASTY;  Surgeon: Mauri Pole;  Location: [facility];  Service: Orthopedics;  Laterality: Right;  . TOTAL KNEE ARTHROPLASTY Right 12-20-2010  dr Alvan Dame _1   . UPPER GASTROINTESTINAL ENDOSCOPY  09/18/2001     reports that she has never smoked. She has never used smokeless tobacco. She reports that she does not drink alcohol or use drugs.  Allergies  Allergen Reactions  . Tape Rash    Blisters  . Latex Rash and Other (See Comments)    blister  . Lidocaine Nausea And Vomiting  . Sulfa Antibiotics Nausea And Vomiting    Family History  Problem Relation Age of Onset  . Heart disease Mother        Enlarged heart  . Arrhythmia Mother   . Diabetes Mother   . Heart attack Mother   . Heart disease Brother        MI at unknown age  . Anesthesia problems Brother   . Heart disease Father   . Heart attack Father   . Cancer Son        non-hodgkins lymphoma  . Stroke Sister   . Heart attack Other        MI at age 76  . Heart disease Brother        CABG  . AAA (abdominal aortic aneurysm) Brother   . CAD Brother      Prior to Admission medications   Medication Sig Start Date End Date Taking? Authorizing Provider  acetaminophen (TYLENOL 8 HOUR ARTHRITIS PAIN) 650 MG CR tablet Take 1,300 mg by mouth 2 (two) times daily.    Yes [provider]   aspirin EC 81 MG tablet Take 81 mg by mouth daily.   Yes [provider]  Calcium-Vitamin D (CALTRATE 600 PLUS-VIT D PO) Take 1 tablet by mouth 2 (two) times daily.    Yes [provider]  docusate sodium (COLACE) 100 MG capsule Take 300 mg by mouth daily.    Yes [provider]  esomeprazole (NEXIUM) 40 MG capsule Take 40 mg by mouth 2 (two) times daily at 8 am and 10 pm.    Yes [provider]  estradiol (ESTRACE) 0.5 MG tablet Take 0.25 mg by mouth every morning.  05/16/16  Yes [provider]  famotidine (PEPCID) 20 MG tablet Take  20 mg by mouth daily before breakfast.    Yes [provider]  ferrous sulfate 325 (65 FE) MG tablet Take 325 mg by mouth 2 (two) times daily.   Yes [provider]  levothyroxine (SYNTHROID, LEVOTHROID) 200 MCG tablet Take 200 mcg by mouth daily before breakfast.   Yes [provider]  medroxyPROGESTERone (PROVERA) 5 MG tablet Take 2.5 mg by mouth every morning.    Yes [provider]  metoprolol succinate (TOPROL-XL) 50 MG 24 hr tablet Take 50 mg by mouth every morning.    Yes [provider]  Multiple Vitamins-Minerals (CENTRUM SILVER ULTRA WOMENS PO) Take 1 tablet by mouth every evening.    Yes [provider]  Multiple Vitamins-Minerals (PRESERVISION AREDS PO) Take 2 capsules by mouth daily.    Yes [provider]  nortriptyline (PAMELOR) 25 MG capsule Take 75 mg by mouth daily.    Yes [provider]  Polyethyl Glycol-Propyl Glycol (SYSTANE ULTRA OP) Place 2 drops into both eyes at bedtime.   Yes [provider]  simvastatin (ZOCOR) 20 MG tablet Take 20 mg by mouth every evening.    Yes [provider]  vitamin B-12 (CYANOCOBALAMIN) 1000 MCG tablet Take 1,000 mcg by mouth daily.   Yes [provider]  vitamin C (ASCORBIC ACID) 500 MG tablet Take 500 mg by mouth daily.    Yes [provider]  Vitamin D,  Ergocalciferol, (DRISDOL) 50000 units CAPS capsule take ONE CAPSULE BY MOUTH every 7 DAYS Patient taking differently: Take 50,000 Units by mouth every 30 (thirty) days. 1st day of each month 08/19/15  Yes Kem Boroughs, FNP  vitamin E 400 UNIT capsule Take 800 Units by mouth daily.    Yes [provider]  zolpidem (AMBIEN) 10 MG tablet Take 1 tablet (10 mg total) by mouth at bedtime. 06/01/15  Yes Theodis Blaze, MD    Physical Exam: Vitals:   04/13/18 1745 04/13/18 2053 04/13/18 2230 04/13/18 2313  BP: (!) 154/100 (!) 174/103 (!) 181/111   Pulse: 87 92 90 94  Resp: _0 Temp: 98.4 F (36.9 C)  98.2 F (36.8 C)   TempSrc: Oral  Oral   SpO2: 97% 100% 98%     Constitutional: NAD, calm  Eyes: PERTLA, lids and conjunctivae normal ENMT: Mucous membranes are moist. Posterior pharynx clear of any exudate or lesions.   Neck: normal, supple, no masses, no thyromegaly Respiratory: clear to auscultation bilaterally, no wheezing, no crackles. Normal respiratory effort.    Cardiovascular: S1 & S2 heard, regular rate and rhythm. No extremity edema.  Abdomen: No distension, no tenderness, soft. Bowel sounds active.  Musculoskeletal: no clubbing / cyanosis. No joint deformity upper and lower extremities.  Skin: healing abdominal surgical incision with crust, no drainage. Warm, dry, well-perfused. Neurologic: No facial asymmetry. Sensation intact, patellar DTR intact. Strength 5/5 in all 4 limbs.  Psychiatric: Alert. Oriented to person and place only. Cooperative.    Labs on Admission: I have personally reviewed following labs and imaging studies  CBC: Recent Labs  Lab 04/11/18 1920 04/13/18 2017  WBC 8.4 10.1  NEUTROABS 6.1 7.3  HGB 11.0* 12.1  HCT 35.8* 39.0  MCV 99.7 100.5*  PLT 383 161*   Basic Metabolic Panel: Recent Labs  Lab 04/11/18 1920 04/13/18 2017  NA 132* 133*  K 4.4 4.0  CL 96* 97*  CO2 28 26  GLUCOSE 177* 116*  BUN 28* 26*  CREATININE 1.05* 1.06*  CALCIUM 9.1 9.6   GFR: CrCl cannot be calculated (Unknown ideal weight.). Liver Function Tests: Recent Labs  Lab 04/11/18 1920 04/13/18 2017  AST 80* 110*  ALT 98* 124*  ALKPHOS 473* 626*  BILITOT 0.3 0.6  PROT 7.5 8.4*  ALBUMIN 3.0* 3.5   No results for input(s): LIPASE, AMYLASE in the last 168 hours. Recent Labs  Lab 04/13/18 2023  AMMONIA 34   Coagulation Profile: No results for input(s): INR, PROTIME in the last 168 hours. Cardiac Enzymes: No results for input(s): CKTOTAL, CKMB, CKMBINDEX, TROPONINI in the last 168 hours. BNP (last 3 results) No results for input(s): PROBNP in the last 8760 hours. HbA1C: No results for input(s): HGBA1C in the last 72 hours. CBG: No results for input(s): GLUCAP in the last 168 hours. Lipid Profile: No results for input(s): CHOL, HDL, LDLCALC, TRIG, CHOLHDL, LDLDIRECT in the last 72 hours. Thyroid Function Tests: No results for input(s): TSH, T4TOTAL, FREET4, T3FREE, THYROIDAB in the last 72 hours. Anemia Panel: No results for input(s): VITAMINB12, FOLATE, FERRITIN, TIBC, IRON, RETICCTPCT in the last 72 hours. Urine analysis:    Component Value Date/Time   COLORURINE YELLOW 04/13/2018 2023   APPEARANCEUR CLEAR 04/13/2018 2023   LABSPEC 1.015 04/13/2018 2023   PHURINE 6.0 04/13/2018 2023   GLUCOSEU NEGATIVE 04/13/2018 2023   HGBUR NEGATIVE 04/13/2018 2023   BILIRUBINUR NEGATIVE 04/13/2018 2023   BILIRUBINUR neg 07/29/2013 0952   KETONESUR NEGATIVE 04/13/2018 2023   PROTEINUR NEGATIVE 04/13/2018 2023   UROBILINOGEN negative 07/29/2013 0952   UROBILINOGEN 0.2 12/24/2010 0729   NITRITE NEGATIVE 04/13/2018 2023   LEUKOCYTESUR MODERATE (A) 04/13/2018 2023   Sepsis Labs: _0 (procalcitonin:4,lacticidven:4) ) Recent Results (from the past 240 hour(s))  Urine Culture     Status: Abnormal   Collection Time: 04/11/18  9:16 PM  Result Value Ref Range Status   Specimen Description   Final    URINE, RANDOM Performed at  [facility], [place] [address] [place], [place] [address]    Special Requests   Final    NONE Performed at [facility], [place] [address], [place], [place] [address]    Culture MULTIPLE SPECIES PRESENT, SUGGEST RECOLLECTION (A)  Final   Report Status 04/12/2018 FINAL  Final     Radiological Exams on Admission: Dg Chest 2 View  Result Date: 04/13/2018 CLINICAL DATA:  AMS EXAM: CHEST - 2 VIEW COMPARISON:  04/11/2018 FINDINGS: The heart size and mediastinal contours are within normal limits. Both lungs are clear. The visualized skeletal structures are unremarkable. IMPRESSION: No acute abnormality of the lungs.  No focal airspace opacity. Electronically Signed   By: Eddie Candle M.D.   On: 04/13/2018 20:47    EKG: Not performed.   Assessment/Plan  1. Acute encephalopathy  - Presents with progressive confusion, paranoia, and hallucinations  - She has MRI brain two days ago without any acute findings  - She denies abd or flank pain, and denies dysuria but urine was sent for culture and she was given an empiric dose of Rocephin in ED  - Check TSH, b12, folate, RPR; start a gentle IVF hydration for hypovolemia, hold further abx for now while following urine culture, pending labs, and clinical course    2. Elevated LFT's  - Alk phos 626 and transaminases in low 100's, increased from prior  - Likely related to liver metastases noted on recent imaging  - Abd exam is benign and she denies N/V/D    3. Urothelial cell carcinoma  -  Status-post left nephrectomy and currently following with oncology for observation  - She has likely liver metastases on recent imaging  - She will continue oncology follow-up after discharge    4. Hyponatremia  - Serum sodium is 133 in setting of hypovolemia  - Do not suspect this to be causing her AMS  - IVF hydration, repeat chem panel in am    5. Mild renal insufficiency  - SCr is 1.06 on admission, similar to prior  -  Renally-dose medications, avoid dehydration    6. Hypothyroidism  - Checking TSH as above  - Continue Synthroid    7. Hypertension with hypertensive urgency  - BP is 180/100 range in ED; doubt this is causing her AMS   - Continue metoprolol with dose now, and use labetalol IVP's as-needed     DVT prophylaxis: sq heparin  Code Status: Full  Family Communication: Discussed with patient Consults called: None Admission status: Observation     Vianne Bulls, MD [facility] Pager [phone]  If 7PM-7AM, please contact night-coverage [email or web address] Password [email or web address]  04/13/2018, 11:40 PM

## 2018-04-14 DIAGNOSIS — R4182 Altered mental status, unspecified: Secondary | ICD-10-CM | POA: Diagnosis present

## 2018-04-14 DIAGNOSIS — M797 Fibromyalgia: Secondary | ICD-10-CM | POA: Diagnosis present

## 2018-04-14 DIAGNOSIS — Z91040 Latex allergy status: Secondary | ICD-10-CM | POA: Diagnosis not present

## 2018-04-14 DIAGNOSIS — G9341 Metabolic encephalopathy: Secondary | ICD-10-CM | POA: Diagnosis present

## 2018-04-14 DIAGNOSIS — C787 Secondary malignant neoplasm of liver and intrahepatic bile duct: Secondary | ICD-10-CM | POA: Diagnosis present

## 2018-04-14 DIAGNOSIS — I700 Atherosclerosis of aorta: Secondary | ICD-10-CM | POA: Diagnosis present

## 2018-04-14 DIAGNOSIS — R443 Hallucinations, unspecified: Secondary | ICD-10-CM | POA: Diagnosis present

## 2018-04-14 DIAGNOSIS — M1990 Unspecified osteoarthritis, unspecified site: Secondary | ICD-10-CM | POA: Diagnosis present

## 2018-04-14 DIAGNOSIS — N179 Acute kidney failure, unspecified: Secondary | ICD-10-CM | POA: Diagnosis present

## 2018-04-14 DIAGNOSIS — Z882 Allergy status to sulfonamides status: Secondary | ICD-10-CM | POA: Diagnosis not present

## 2018-04-14 DIAGNOSIS — E039 Hypothyroidism, unspecified: Secondary | ICD-10-CM | POA: Diagnosis present

## 2018-04-14 DIAGNOSIS — E871 Hypo-osmolality and hyponatremia: Secondary | ICD-10-CM | POA: Diagnosis present

## 2018-04-14 DIAGNOSIS — Z8553 Personal history of malignant neoplasm of renal pelvis: Secondary | ICD-10-CM | POA: Diagnosis not present

## 2018-04-14 DIAGNOSIS — R945 Abnormal results of liver function studies: Secondary | ICD-10-CM | POA: Diagnosis present

## 2018-04-14 DIAGNOSIS — R7989 Other specified abnormal findings of blood chemistry: Secondary | ICD-10-CM | POA: Diagnosis present

## 2018-04-14 DIAGNOSIS — E1151 Type 2 diabetes mellitus with diabetic peripheral angiopathy without gangrene: Secondary | ICD-10-CM | POA: Diagnosis present

## 2018-04-14 DIAGNOSIS — I2510 Atherosclerotic heart disease of native coronary artery without angina pectoris: Secondary | ICD-10-CM | POA: Diagnosis present

## 2018-04-14 DIAGNOSIS — G8929 Other chronic pain: Secondary | ICD-10-CM | POA: Diagnosis present

## 2018-04-14 DIAGNOSIS — I10 Essential (primary) hypertension: Secondary | ICD-10-CM | POA: Diagnosis not present

## 2018-04-14 DIAGNOSIS — E860 Dehydration: Secondary | ICD-10-CM | POA: Diagnosis present

## 2018-04-14 DIAGNOSIS — Z905 Acquired absence of kidney: Secondary | ICD-10-CM | POA: Diagnosis not present

## 2018-04-14 DIAGNOSIS — F329 Major depressive disorder, single episode, unspecified: Secondary | ICD-10-CM | POA: Diagnosis present

## 2018-04-14 DIAGNOSIS — K219 Gastro-esophageal reflux disease without esophagitis: Secondary | ICD-10-CM | POA: Diagnosis present

## 2018-04-14 DIAGNOSIS — Z888 Allergy status to other drugs, medicaments and biological substances status: Secondary | ICD-10-CM | POA: Diagnosis not present

## 2018-04-14 DIAGNOSIS — I160 Hypertensive urgency: Secondary | ICD-10-CM | POA: Diagnosis present

## 2018-04-14 DIAGNOSIS — G9340 Encephalopathy, unspecified: Secondary | ICD-10-CM | POA: Diagnosis not present

## 2018-04-14 LAB — CBC WITH DIFFERENTIAL/PLATELET
Abs Immature Granulocytes: 0.04 10*3/uL (ref 0.00–0.07)
Basophils Absolute: 0 10*3/uL (ref 0.0–0.1)
Basophils Relative: 0 %
Eosinophils Absolute: 0.1 10*3/uL (ref 0.0–0.5)
Eosinophils Relative: 1 %
HEMATOCRIT: 36.8 % (ref 36.0–46.0)
Hemoglobin: 11.3 g/dL — ABNORMAL LOW (ref 12.0–15.0)
Immature Granulocytes: 0 %
Lymphocytes Relative: 9 %
Lymphs Abs: 1 10*3/uL (ref 0.7–4.0)
MCH: 30.5 pg (ref 26.0–34.0)
MCHC: 30.7 g/dL (ref 30.0–36.0)
MCV: 99.2 fL (ref 80.0–100.0)
Monocytes Absolute: 1 10*3/uL (ref 0.1–1.0)
Monocytes Relative: 10 %
Neutro Abs: 8.5 10*3/uL — ABNORMAL HIGH (ref 1.7–7.7)
Neutrophils Relative %: 80 %
Platelets: 390 10*3/uL (ref 150–400)
RBC: 3.71 MIL/uL — ABNORMAL LOW (ref 3.87–5.11)
RDW: 14.4 % (ref 11.5–15.5)
WBC: 10.7 10*3/uL — ABNORMAL HIGH (ref 4.0–10.5)
nRBC: 0 % (ref 0.0–0.2)

## 2018-04-14 LAB — COMPREHENSIVE METABOLIC PANEL
ALBUMIN: 3.2 g/dL — AB (ref 3.5–5.0)
ALT: 107 U/L — ABNORMAL HIGH (ref 0–44)
AST: 95 U/L — ABNORMAL HIGH (ref 15–41)
Alkaline Phosphatase: 555 U/L — ABNORMAL HIGH (ref 38–126)
Anion gap: 10 (ref 5–15)
BUN: 20 mg/dL (ref 8–23)
CALCIUM: 9.1 mg/dL (ref 8.9–10.3)
CO2: 25 mmol/L (ref 22–32)
Chloride: 98 mmol/L (ref 98–111)
Creatinine, Ser: 0.89 mg/dL (ref 0.44–1.00)
GFR calc non Af Amer: 60 mL/min — ABNORMAL LOW (ref >60)
Glucose, Bld: 159 mg/dL — ABNORMAL HIGH (ref 70–99)
Potassium: 4.5 mmol/L (ref 3.5–5.1)
SODIUM: 133 mmol/L — AB (ref 135–145)
Total Bilirubin: 0.7 mg/dL (ref 0.3–1.2)
Total Protein: 7.3 g/dL (ref 6.5–8.1)

## 2018-04-14 LAB — TSH: TSH: 1.395 u[IU]/mL (ref 0.350–4.500)

## 2018-04-14 LAB — VITAMIN B12: Vitamin B-12: 601 pg/mL (ref 180–914)

## 2018-04-14 NOTE — Evaluation (Signed)
Physical Therapy Evaluation Patient Details Name: [name] MRN: [id number] DOB: 15-Dec-1932 Today's Date: 04/14/2018   History of Present Illness  85yo admitted with AMS and with PMH of PVD, fibromyalgia, colon cancer, had gross hematuria and was found to have a large left renal mass with an ipsalateral adrenal metastasis. s/p radical nephrectomy 03/09/18.  Clinical Impression  Pt admitted as above and presenting with functional mobility limitations 2* generalized weakness, ambulatory balance deficits, and cognitive deficits with questionable safety awareness.  Pt unclear on level of assist at home, initially insisting she lives here at the hospital.  Pt should progress to dc home with family if 24/7 assist is available.  Otherwise, pt would benefit from follow up rehab at SNF level to maximize IND and safety - all dependent on acute stay progress physically and cognitively.    Follow Up Recommendations Home health PT;SNF(dependent on acute stay progress and level of assist at home)    Equipment Recommendations  None recommended by PT    Recommendations for Other Services OT consult     Precautions / Restrictions Precautions Precautions: Fall Restrictions Weight Bearing Restrictions: No      Mobility  Bed Mobility               General bed mobility comments: NT - pt sitting at EOB with RN on PT arrival to room  Transfers Overall transfer level: Needs assistance Equipment used: Rolling walker (2 wheeled) Transfers: Sit to/from Stand Sit to Stand: Min assist         General transfer comment: cues for use of UEs to self assist with physical assist to bring wt up and fwd and to balance in initial standing  Ambulation/Gait Ambulation/Gait assistance: Min assist Gait Distance (Feet): 18 Feet Assistive device: Rolling walker (2 wheeled) Gait Pattern/deviations: Step-through pattern;Decreased step length - right;Decreased step length - left;Shuffle;Trunk  flexed Gait velocity: decr   General Gait Details: cues for posture and position from RW; physical assist to manage RW and for balance/support; distance ltd by pt complaint of "my knees are giving out"  Stairs            Wheelchair Mobility    Modified Rankin (Stroke Patients Only)       Balance Overall balance assessment: Needs assistance Sitting-balance support: No upper extremity supported;Feet supported Sitting balance-Leahy Scale: Good     Standing balance support: Bilateral upper extremity supported Standing balance-Leahy Scale: Poor                               Pertinent Vitals/Pain Pain Assessment: No/denies pain    Home Living Family/patient expects to be discharged to:: Private residence Living Arrangements: Children Available Help at Discharge: Family Type of Home: Mobile home Home Access: Ramped entrance     Home Layout: One level Home Equipment: Environmental consultant - 4 wheels;Cane - single point;Bedside commode Additional Comments: Information taken from previous stay, pt not reliable historian at time of eval    Prior Function Level of Independence: Independent with assistive device(s)         Comments: uses rollator, bathes and dresses independently     Hand Dominance        Extremity/Trunk Assessment   Upper Extremity Assessment Upper Extremity Assessment: Generalized weakness    Lower Extremity Assessment Lower Extremity Assessment: Generalized weakness    Cervical / Trunk Assessment Cervical / Trunk Assessment: Kyphotic  Communication   Communication: No difficulties  Cognition  Arousal/Alertness: Awake/alert Behavior During Therapy: Flat affect Overall Cognitive Status: No family/caregiver present to determine baseline cognitive functioning                                 General Comments: Pt appears confused vs recent previous admissions.  Pt states she lives here      General Comments      Exercises      Assessment/Plan    PT Assessment Patient needs continued PT services  PT Problem List Decreased strength;Decreased activity tolerance;Decreased balance;Decreased mobility;Decreased cognition;Decreased knowledge of use of DME;Decreased safety awareness       PT Treatment Interventions DME instruction;Gait training;Functional mobility training;Therapeutic activities;Therapeutic exercise;Balance training;Patient/family education    PT Goals (Current goals can be found in the Care Plan section)  Acute Rehab PT Goals Patient Stated Goal: No goals expressed PT Goal Formulation: Patient unable to participate in goal setting Time For Goal Achievement: 04/28/18 Potential to Achieve Goals: Fair    Frequency Min 3X/week   Barriers to discharge Decreased caregiver support Pt unclear on level of assist at home; Pt currently would require 24/7 assist for safety    Co-evaluation               AM-PAC PT "6 Clicks" Mobility  Outcome Measure Help needed turning from your back to your side while in a flat bed without using bedrails?: A Little Help needed moving from lying on your back to sitting on the side of a flat bed without using bedrails?: A Little Help needed moving to and from a bed to a chair (including a wheelchair)?: A Little Help needed standing up from a chair using your arms (e.g., wheelchair or bedside chair)?: A Little Help needed to walk in hospital room?: A Little Help needed climbing 3-5 steps with a railing? : A Lot 6 Click Score: 17    End of Session Equipment Utilized During Treatment: Gait belt Activity Tolerance: Patient limited by fatigue Patient left: in chair;with call bell/phone within reach;with chair alarm set Nurse Communication: Mobility status PT Visit Diagnosis: Unsteadiness on feet (R26.81);Muscle weakness (generalized) (M62.81);Difficulty in walking, not elsewhere classified (R26.2)    Time: 6045-4098 PT Time Calculation (min) (ACUTE ONLY): 15  min   Charges:   PT Evaluation $PT Eval Low Complexity: 1 Low          [place] Pager [phone] Office [phone]   Aracelly Tencza 04/14/2018, 4:40 PM

## 2018-04-14 NOTE — Progress Notes (Signed)
PROGRESS NOTE    [name]  HQI:[id number] DOB: 09/10/1932 DOA: 04/13/2018 PCP: [name], MD   Brief Narrative: [name] is a 85 y.o. female with medical history significant for hypertension, depression, thyroidism, chronic pain, and urothelial carcinoma status post left nephrectomy, now presenting to the emergency department for evaluation of progressive confusion.  Patient's family has been concerned for increasing confusion with paranoia and apparent hallucinations.  Patient suspects family members of trying to harm her, has had waxing and waning confusion that has been overall worsening, and was evaluated in the emergency department 2 days ago where she had a negative MRI brain and returned home.  Patient denies any fevers, chest pain, abdominal pain, cough, or shortness of breath.  Patient is worried that her husband is in the emergency department and trying to harm her.  Assessment & Plan:   Principal Problem:   Acute encephalopathy Active Problems:   Essential hypertension   Hypothyroidism   Hyponatremia   Mild renal insufficiency   LFTs abnormal   Hypertensive urgency   Acute encephalopathy: Unclear etiology, suspect from dehydration. Pt is still confused. She says her son dropped her off at ED and left. She denies any chest pain or sob. she reports feeing weak and tired. She denies any dysuria.  Urine cultures are pending. UA is negative for infection. CXR is negative for infection.  TSH wnl B12 is 601 FOLATE pending.  Ammonia level. Wnl.  Would recommend to continue with IV fluids for another 24 hours.  Get PT/OT evaluation.    Urothelial cell ca. With mets to the liver: Status-post left nephrectomy and currently following with oncology for observation    Hyponatremia: probably from dehydration.    AKI:  Improving with  hydration.    Hypothyroidism: TSH wnl .  Resume synthroid.    Hypertensive urgency: Improved.   DVT  prophylaxis:sq heparin.  Code Status:  Full code.  Family Communication: none at bedside.  Disposition Plan: pending PT/OT eval.   Consultants:  None.   Procedures: None.  Antimicrobials: none.    Subjective: Wants to go home. No chest pain or sob.   Objective: Vitals:   04/13/18 2313 04/14/18 0016 04/14/18 0243 04/14/18 0606  BP:  (!) 193/88 (!) 160/83 (!) 142/89  Pulse: 94 88 93 93  Resp:  20  (!) 21  Temp:  98 F (36.7 C)  97.9 F (36.6 C)  TempSrc:    Oral  SpO2:  98% 97% 93%  Weight:  53.4 kg    Height:  5\' 6"  (1.676 m)      Intake/Output Summary (Last 24 hours) at 04/14/2018 1209 Last data filed at 04/14/2018 1000 Gross per 24 hour  Intake 592.65 ml  Output -  Net 592.65 ml   Filed Weights   04/14/18 0016  Weight: 53.4 kg    Examination:  General exam: Appears calm and comfortable  Respiratory system: Clear to auscultation. Respiratory effort normal. Cardiovascular system: S1 & S2 heard, RRR.  Gastrointestinal system: Abdomen is nondistended, soft and nontender. No organomegaly or masses felt. Normal bowel sounds heard. Central nervous system: Alert and oriented. No focal neurological deficits. Extremities: Symmetric 5 x 5 power. Skin: No rashes, lesions or ulcers Psychiatry: Mood & affect appropriate.     Data Reviewed: I have personally reviewed following labs and imaging studies  CBC: Recent Labs  Lab 04/11/18 1920 04/13/18 2017 04/14/18 0426  WBC 8.4 10.1 10.7*  NEUTROABS 6.1 7.3 8.5*  HGB 11.0* 12.1  11.3*  HCT 35.8* 39.0 36.8  MCV 99.7 100.5* 99.2  PLT 383 441* 601   Basic Metabolic Panel: Recent Labs  Lab 04/11/18 1920 04/13/18 2017 04/14/18 0426  NA 132* 133* 133*  K 4.4 4.0 4.5  CL 96* 97* 98  CO2 28 26 25   GLUCOSE 177* 116* 159*  BUN 28* 26* 20  CREATININE 1.05* 1.06* 0.89  CALCIUM 9.1 9.6 9.1   GFR: Estimated Creatinine Clearance: 40.4 mL/min (by C-G formula based on SCr of 0.89 mg/dL). Liver Function Tests: Recent  Labs  Lab 04/11/18 1920 04/13/18 2017 04/14/18 0426  AST 80* 110* 95*  ALT 98* 124* 107*  ALKPHOS 473* 626* 555*  BILITOT 0.3 0.6 0.7  PROT 7.5 8.4* 7.3  ALBUMIN 3.0* 3.5 3.2*   No results for input(s): LIPASE, AMYLASE in the last 168 hours. Recent Labs  Lab 04/13/18 2023  AMMONIA 34   Coagulation Profile: No results for input(s): INR, PROTIME in the last 168 hours. Cardiac Enzymes: No results for input(s): CKTOTAL, CKMB, CKMBINDEX, TROPONINI in the last 168 hours. BNP (last 3 results) No results for input(s): PROBNP in the last 8760 hours. HbA1C: No results for input(s): HGBA1C in the last 72 hours. CBG: No results for input(s): GLUCAP in the last 168 hours. Lipid Profile: No results for input(s): CHOL, HDL, LDLCALC, TRIG, CHOLHDL, LDLDIRECT in the last 72 hours. Thyroid Function Tests: Recent Labs    04/14/18 0009  TSH 1.395   Anemia Panel: Recent Labs    04/14/18 0009  VITAMINB12 601   Sepsis Labs: No results for input(s): PROCALCITON, LATICACIDVEN in the last 168 hours.  Recent Results (from the past 240 hour(s))  Urine Culture     Status: Abnormal   Collection Time: 04/11/18  9:16 PM  Result Value Ref Range Status   Specimen Description   Final    URINE, RANDOM Performed at [facility] [address] [place], [place] [address]    Special Requests   Final    NONE Performed at [facility], [place] [address] [place], [place] [address]    Culture MULTIPLE SPECIES PRESENT, SUGGEST RECOLLECTION (A)  Final   Report Status 04/12/2018 FINAL  Final         Radiology Studies: Dg Chest 2 View  Result Date: 04/13/2018 CLINICAL DATA:  AMS EXAM: CHEST - 2 VIEW COMPARISON:  04/11/2018 FINDINGS: The heart size and mediastinal contours are within normal limits. Both lungs are clear. The visualized skeletal structures are unremarkable. IMPRESSION: No acute abnormality of the lungs.  No focal airspace opacity. Electronically  Signed   By: Eddie Candle M.D.   On: 04/13/2018 20:47        Scheduled Meds: . aspirin EC  81 mg Oral Daily  . famotidine  20 mg Oral QAC breakfast  . heparin  5,000 Units Subcutaneous Q8H  . levothyroxine  200 mcg Oral Q0600  . metoprolol succinate  50 mg Oral Daily  . pantoprazole  40 mg Oral Daily  . sodium chloride flush  3 mL Intravenous Q12H   Continuous Infusions:   LOS: 0 days    Time spent: 34 minutes.     Hosie Poisson, MD [facility] Pager [phone]  If 7PM-7AM, please contact night-coverage [email or web address] Password [facility] 04/14/2018, 12:09 PM

## 2018-04-14 NOTE — Plan of Care (Signed)
  Problem: Clinical Measurements: Goal: Ability to maintain clinical measurements within normal limits will improve Outcome: Progressing Goal: Diagnostic test results will improve Outcome: Progressing   Problem: Activity: Goal: Risk for activity intolerance will decrease Outcome: Progressing   Problem: Nutrition: Goal: Adequate nutrition will be maintained Outcome: Progressing   Problem: Safety: Goal: Ability to remain free from injury will improve Outcome: Progressing   Problem: Skin Integrity: Goal: Risk for impaired skin integrity will decrease Outcome: Progressing

## 2018-04-14 NOTE — Care Management Obs Status (Signed)
[facility] NOTIFICATION   Patient Details  Name: [name] MRN: [id number] Date of Birth: [birth year]   Medicare Observation Status Notification Given:  Yes    Erenest Rasher, RN 04/14/2018, 4:25 PM

## 2018-04-15 LAB — URINE CULTURE

## 2018-04-15 LAB — COMPREHENSIVE METABOLIC PANEL
ALT: 118 U/L — ABNORMAL HIGH (ref 0–44)
AST: 89 U/L — ABNORMAL HIGH (ref 15–41)
Albumin: 3.3 g/dL — ABNORMAL LOW (ref 3.5–5.0)
Alkaline Phosphatase: 667 U/L — ABNORMAL HIGH (ref 38–126)
Anion gap: 12 (ref 5–15)
BUN: 13 mg/dL (ref 8–23)
CO2: 23 mmol/L (ref 22–32)
Calcium: 9.2 mg/dL (ref 8.9–10.3)
Chloride: 96 mmol/L — ABNORMAL LOW (ref 98–111)
Creatinine, Ser: 0.8 mg/dL (ref 0.44–1.00)
GFR calc non Af Amer: >60 mL/min (ref >60)
Glucose, Bld: 154 mg/dL — ABNORMAL HIGH (ref 70–99)
Potassium: 3.9 mmol/L (ref 3.5–5.1)
Sodium: 131 mmol/L — ABNORMAL LOW (ref 135–145)
Total Bilirubin: 0.6 mg/dL (ref 0.3–1.2)
Total Protein: 7.9 g/dL (ref 6.5–8.1)

## 2018-04-15 LAB — HIV ANTIBODY (ROUTINE TESTING W REFLEX): HIV Screen 4th Generation wRfx: NONREACTIVE

## 2018-04-15 LAB — RPR: RPR Ser Ql: NONREACTIVE

## 2018-04-15 MED ORDER — AMLODIPINE BESYLATE 5 MG PO TABS
5.0000 mg | ORAL_TABLET | Freq: Every day | ORAL | Status: DC
  Administered 2018-04-15 – 2018-04-16 (×2): 5 mg via ORAL
  Filled 2018-04-15 (×2): qty 1

## 2018-04-15 MED ORDER — HYDRALAZINE HCL 20 MG/ML IJ SOLN
10.0000 mg | Freq: Four times a day (QID) | INTRAMUSCULAR | Status: DC | PRN
  Administered 2018-04-15: 10 mg via INTRAVENOUS
  Filled 2018-04-15: qty 1

## 2018-04-15 NOTE — Progress Notes (Signed)
When pt is d/c please coordinate w/ son [name] for pick up at [phone]

## 2018-04-15 NOTE — Progress Notes (Signed)
Physical Therapy Treatment Patient Details Name: [name] MRN: [id number] DOB: 1932/06/22 Today's Date: 04/15/2018    History of Present Illness 85yo admitted with AMS and with PMH of PVD, fibromyalgia, colon cancer, had gross hematuria and was found to have a large left renal mass with an ipsalateral adrenal metastasis. s/p radical nephrectomy 03/09/18.    PT Comments    Pt with marked improvement in activity tolerance and ambulatory balance but continues to demonstrate mild general instability and with one mild LOB while ambulating this pm.   Follow Up Recommendations  Home health PT     Equipment Recommendations  None recommended by PT    Recommendations for Other Services OT consult     Precautions / Restrictions Precautions Precautions: Fall Restrictions Weight Bearing Restrictions: No    Mobility  Bed Mobility Overal bed mobility: Needs Assistance Bed Mobility: Supine to Sit     Supine to sit: Min guard;Supervision     General bed mobility comments: Increased time but no physical assist  Transfers Overall transfer level: Needs assistance Equipment used: Rolling walker (2 wheeled) Transfers: Sit to/from Stand Sit to Stand: Min assist Stand pivot transfers: Min assist       General transfer comment: cues for hand placement  Ambulation/Gait Ambulation/Gait assistance: Min assist Gait Distance (Feet): 400 Feet Assistive device: Rolling walker (2 wheeled) Gait Pattern/deviations: Step-through pattern;Decreased step length - right;Decreased step length - left;Shuffle;Trunk flexed Gait velocity: decr   General Gait Details: cues for posture, position from RW and safety awareness;    Stairs             Wheelchair Mobility    Modified Rankin (Stroke Patients Only)       Balance Overall balance assessment: Needs assistance Sitting-balance support: No upper extremity supported;Feet supported Sitting balance-Leahy Scale: Good      Standing balance support: No upper extremity supported Standing balance-Leahy Scale: Fair                              Cognition Arousal/Alertness: Awake/alert Behavior During Therapy: Flat affect Overall Cognitive Status: No family/caregiver present to determine baseline cognitive functioning                                 General Comments: pt teary stating things are just confusing      Exercises      General Comments        Pertinent Vitals/Pain Pain Assessment: No/denies pain    Home Living Family/patient expects to be discharged to:: Private residence Living Arrangements: Children Available Help at Discharge: Family Type of Home: Mobile home Home Access: Ramped entrance   Home Layout: One level Home Equipment: Environmental consultant - 4 wheels;Cane - single point;Bedside commode Additional Comments: Information taken from previous stay, pt not reliable historian at time of eval    Prior Function Level of Independence: Independent with assistive device(s)      Comments: uses rollator, bathes and dresses independently   PT Goals (current goals can now be found in the care plan section) Acute Rehab PT Goals Patient Stated Goal: No goals expressed PT Goal Formulation: Patient unable to participate in goal setting Time For Goal Achievement: 04/28/18 Potential to Achieve Goals: Fair Progress towards PT goals: Progressing toward goals    Frequency    Min 3X/week      PT Plan Discharge plan needs to be  updated    Co-evaluation              AM-PAC PT "6 Clicks" Mobility   Outcome Measure  Help needed turning from your back to your side while in a flat bed without using bedrails?: A Little Help needed moving from lying on your back to sitting on the side of a flat bed without using bedrails?: A Little Help needed moving to and from a bed to a chair (including a wheelchair)?: A Little Help needed standing up from a chair using your arms  (e.g., wheelchair or bedside chair)?: A Little Help needed to walk in hospital room?: A Little Help needed climbing 3-5 steps with a railing? : A Lot 6 Click Score: 17    End of Session Equipment Utilized During Treatment: Gait belt Activity Tolerance: Patient tolerated treatment well Patient left: in chair;with call bell/phone within reach;with chair alarm set Nurse Communication: Mobility status PT Visit Diagnosis: Unsteadiness on feet (R26.81);Muscle weakness (generalized) (M62.81);Difficulty in walking, not elsewhere classified (R26.2)     Time: 1525-1550 PT Time Calculation (min) (ACUTE ONLY): 25 min  Charges:  $Gait Training: 23-37 mins                     [place] Pager [phone] Office [phone]    Jayson Waterhouse 04/15/2018, 4:56 PM

## 2018-04-15 NOTE — Evaluation (Signed)
Occupational Therapy Evaluation Patient Details Name: [name] MRN: [id number] DOB: 1932/01/19 Today's Date: 04/15/2018    History of Present Illness 85yo admitted with AMS and with PMH of PVD, fibromyalgia, colon cancer, had gross hematuria and was found to have a large left renal mass with an ipsalateral adrenal metastasis. s/p radical nephrectomy 03/09/18.   Clinical Impression   Pt admitted with AMS and the above.   Pt currently with functional limitations due to the deficits listed below (see OT Problem List).  Pt will benefit from skilled OT to increase their safety and independence with ADL and functional mobility for ADL to facilitate discharge to venue listed below.      Follow Up Recommendations  Home health OT;SNF;Supervision/Assistance - 24 hour ( will need to consult with family)   Equipment Recommendations  3 in 1 bedside commode       Precautions / Restrictions    Fall    Mobility Bed Mobility Overal bed mobility: Needs Assistance Bed Mobility: Supine to Sit     Supine to sit: Mod assist        Transfers Overall transfer level: Needs assistance Equipment used: Rolling walker (2 wheeled) Transfers: Sit to/from [facility] Sit to Stand: Min assist Stand pivot transfers: Min assist       General transfer comment: cues for hand placement    Balance Overall balance assessment: Needs assistance Sitting-balance support: No upper extremity supported;Feet supported Sitting balance-Leahy Scale: Good     Standing balance support: Bilateral upper extremity supported Standing balance-Leahy Scale: Poor                             ADL either performed or assessed with clinical judgement   ADL Overall ADL's : Needs assistance/impaired Eating/Feeding: Minimal assistance;Sitting   Grooming: Minimal assistance;Sitting   Upper Body Bathing: Minimal assistance;Sitting   Lower Body Bathing: Maximal assistance;Sit to/from  stand;Cueing for sequencing;Cueing for safety   Upper Body Dressing : Minimal assistance;Sitting   Lower Body Dressing: Maximal assistance;Sit to/from stand;Cueing for sequencing;Cueing for safety   Toilet Transfer: Moderate assistance;Stand-pivot;Cueing for safety;Cueing for sequencing;BSC   Toileting- Clothing Manipulation and Hygiene: Sitting/lateral lean;Cueing for safety;Cueing for sequencing         General ADL Comments: pt teary at times during eval. OT reoriented pt and pt still teary. CNA reports she has been teary off and on this morning     Vision Patient Visual Report: No change from baseline              Pertinent Vitals/Pain Pain Assessment: No/denies pain     Hand Dominance     Extremity/Trunk Assessment Upper Extremity Assessment Upper Extremity Assessment: Generalized weakness       Cervical / Trunk Assessment Cervical / Trunk Assessment: Kyphotic   Communication Communication Communication: No difficulties   Cognition Arousal/Alertness: Awake/alert Behavior During Therapy: Flat affect(teary at times)                                   General Comments: pt teary stating things are just confusing   General Comments               Home Living Family/patient expects to be discharged to:: Private residence Living Arrangements: Children Available Help at Discharge: Family Type of Home: Mobile home Home Access: Ramped entrance     Home Layout: One level  Bathroom Shower/Tub: Teacher, early years/pre: Handicapped height     Home Equipment: Environmental consultant - 4 wheels;Cane - single point;Bedside commode   Additional Comments: Information taken from previous stay, pt not reliable historian at time of eval      Prior Functioning/Environment Level of Independence: Independent with assistive device(s)        Comments: uses rollator, bathes and dresses independently        OT Problem List: Decreased  strength;Decreased activity tolerance;Impaired balance (sitting and/or standing);Decreased safety awareness;Decreased knowledge of precautions;Decreased cognition      OT Treatment/Interventions: Self-care/ADL training;Patient/family education;DME and/or AE instruction    OT Goals(Current goals can be found in the care plan section) Acute Rehab OT Goals Patient Stated Goal: No goals expressed OT Goal Formulation: With patient Time For Goal Achievement: 04/22/18 Potential to Achieve Goals: Good  OT Frequency: Min 2X/week   Barriers to D/C:               AM-PAC OT "6 Clicks" Daily Activity     Outcome Measure Help from another person eating meals?: A Little Help from another person taking care of personal grooming?: A Little Help from another person toileting, which includes using toliet, bedpan, or urinal?: A Lot Help from another person bathing (including washing, rinsing, drying)?: A Lot Help from another person to put on and taking off regular upper body clothing?: A Little Help from another person to put on and taking off regular lower body clothing?: A Lot 6 Click Score: 15   End of Session Equipment Utilized During Treatment: Gait belt;Rolling walker Nurse Communication: Mobility status  Activity Tolerance: Patient tolerated treatment well Patient left: in bed  OT Visit Diagnosis: Unsteadiness on feet (R26.81);Other abnormalities of gait and mobility (R26.89);Muscle weakness (generalized) (M62.81)                Time: 6294-7654 OT Time Calculation (min): 20 min Charges:  OT General Charges $OT Visit: 1 Visit OT Evaluation $OT Eval Moderate Complexity: 1 Mod  Kari Baars, [facility] [phone] Office- [phone]     Dequavion Follette, Edwena Felty D 04/15/2018, 1:21 PM

## 2018-04-15 NOTE — Plan of Care (Signed)
  Problem: Clinical Measurements: Goal: Diagnostic test results will improve Outcome: Progressing   Problem: Activity: Goal: Risk for activity intolerance will decrease Outcome: Progressing   Problem: Nutrition: Goal: Adequate nutrition will be maintained Outcome: Progressing   Problem: Safety: Goal: Ability to remain free from injury will improve Outcome: Progressing   Problem: Skin Integrity: Goal: Risk for impaired skin integrity will decrease Outcome: Progressing   

## 2018-04-15 NOTE — Progress Notes (Signed)
PROGRESS NOTE    [name]  ATF:[id number] DOB: 07/08/1932 DOA: 04/13/2018 PCP: [name], MD   Brief Narrative: [name] is a 85 y.o. female with medical history significant for hypertension, depression, thyroidism, chronic pain, and urothelial carcinoma status post left nephrectomy, now presenting to the emergency department for evaluation of progressive confusion.  Patient's family has been concerned for increasing confusion with paranoia and apparent hallucinations.  Patient suspects family members of trying to harm her, has had waxing and waning confusion that has been overall worsening, and was evaluated in the emergency department 2 days ago where she had a negative MRI brain and returned home.  Patient denies any fevers, chest pain, abdominal pain, cough, or shortness of breath.  Patient is worried that her husband is in the emergency department and trying to harm her.  Assessment & Plan:   Principal Problem:   Acute encephalopathy Active Problems:   Essential hypertension   Hypothyroidism   Hyponatremia   Mild renal insufficiency   LFTs abnormal   Hypertensive urgency   Acute encephalopathy: Unclear etiology, suspect from dehydration. Pt is still confused. She says her son dropped her off at ED and left. She denies any chest pain or sob. she reports feeing weak and tired. She denies any dysuria.  Urine cultures are pending. UA is negative for infection. CXR is negative for infection.  TSH wnl B12 is 601 FOLATE pending.  Ammonia level. Wnl.  Would recommend to continue with IV fluids for another 24 hours.  Get PT/OT evaluation.    Urothelial cell ca. With mets to the liver: Status-post left nephrectomy and currently following with oncology for observation    Hyponatremia: probably from dehydration.    AKI:  Improving with  hydration.    Hypothyroidism: TSH wnl .  Resume synthroid.    Hypertensive urgency: Improved.   DVT  prophylaxis:sq heparin.  Code Status:  Full code.  Family Communication: none at bedside.  Disposition Plan: pending PT/OT eval.   Consultants:  None.   Procedures: None.  Antimicrobials: none.    Subjective: Wants to go home. No chest pain or sob.   Objective: Vitals:   04/14/18 1400 04/14/18 2028 04/15/18 0627 04/15/18 0924  BP: (!) 152/88 (!) 179/89 (!) 181/107 (!) 160/100  Pulse:  95 97 (!) 109  Resp:  17 17 18   Temp:  98.1 F (36.7 C) 97.9 F (36.6 C) 97.9 F (36.6 C)  TempSrc:  Oral Oral Oral  SpO2:  94% 94% 97%  Weight:      Height:       No intake or output data in the 24 hours ending 04/15/18 1152 Filed Weights   04/14/18 0016  Weight: 53.4 kg    Examination:  General exam: Appears calm and comfortable  Respiratory system: Clear to auscultation. Respiratory effort normal. Cardiovascular system: S1 & S2 heard, RRR.  Gastrointestinal system: Abdomen is nondistended, soft and nontender. No organomegaly or masses felt. Normal bowel sounds heard. Central nervous system: Alert and oriented. No focal neurological deficits. Extremities: Symmetric 5 x 5 power. Skin: No rashes, lesions or ulcers Psychiatry: Mood & affect appropriate.     Data Reviewed: I have personally reviewed following labs and imaging studies  CBC: Recent Labs  Lab 04/11/18 1920 04/13/18 2017 04/14/18 0426  WBC 8.4 10.1 10.7*  NEUTROABS 6.1 7.3 8.5*  HGB 11.0* 12.1 11.3*  HCT 35.8* 39.0 36.8  MCV 99.7 100.5* 99.2  PLT 383 441* 270   Basic Metabolic  Panel: Recent Labs  Lab 04/11/18 1920 04/13/18 2017 04/14/18 0426  NA 132* 133* 133*  K 4.4 4.0 4.5  CL 96* 97* 98  CO2 28 26 25   GLUCOSE 177* 116* 159*  BUN 28* 26* 20  CREATININE 1.05* 1.06* 0.89  CALCIUM 9.1 9.6 9.1   GFR: Estimated Creatinine Clearance: 40.4 mL/min (by C-G formula based on SCr of 0.89 mg/dL). Liver Function Tests: Recent Labs  Lab 04/11/18 1920 04/13/18 2017 04/14/18 0426  AST 80* 110* 95*  ALT  98* 124* 107*  ALKPHOS 473* 626* 555*  BILITOT 0.3 0.6 0.7  PROT 7.5 8.4* 7.3  ALBUMIN 3.0* 3.5 3.2*   No results for input(s): LIPASE, AMYLASE in the last 168 hours. Recent Labs  Lab 04/13/18 2023  AMMONIA 34   Coagulation Profile: No results for input(s): INR, PROTIME in the last 168 hours. Cardiac Enzymes: No results for input(s): CKTOTAL, CKMB, CKMBINDEX, TROPONINI in the last 168 hours. BNP (last 3 results) No results for input(s): PROBNP in the last 8760 hours. HbA1C: No results for input(s): HGBA1C in the last 72 hours. CBG: No results for input(s): GLUCAP in the last 168 hours. Lipid Profile: No results for input(s): CHOL, HDL, LDLCALC, TRIG, CHOLHDL, LDLDIRECT in the last 72 hours. Thyroid Function Tests: Recent Labs    04/14/18 0009  TSH 1.395   Anemia Panel: Recent Labs    04/14/18 0009  VITAMINB12 601   Sepsis Labs: No results for input(s): PROCALCITON, LATICACIDVEN in the last 168 hours.  Recent Results (from the past 240 hour(s))  Urine Culture     Status: Abnormal   Collection Time: 04/11/18  9:16 PM  Result Value Ref Range Status   Specimen Description   Final    URINE, RANDOM Performed at [facility] [address] [place], [place] [address]    Special Requests   Final    NONE Performed at [facility], [place] [address] [place], [place] [address]    Culture MULTIPLE SPECIES PRESENT, SUGGEST RECOLLECTION (A)  Final   Report Status 04/12/2018 FINAL  Final  Urine Culture     Status: None   Collection Time: 04/13/18  8:23 PM  Result Value Ref Range Status   Specimen Description   Final    URINE, RANDOM Performed at [facility] [address] [place], [place] [address]    Special Requests   Final    NONE Performed at [facility], [place] [address], [place],  [address]    Culture   Final    Multiple bacterial morphotypes present, none predominant. Suggest  appropriate recollection if clinically indicated.   Report Status 04/15/2018 FINAL  Final         Radiology Studies: Dg Chest 2 View  Result Date: 04/13/2018 CLINICAL DATA:  AMS EXAM: CHEST - 2 VIEW COMPARISON:  04/11/2018 FINDINGS: The heart size and mediastinal contours are within normal limits. Both lungs are clear. The visualized skeletal structures are unremarkable. IMPRESSION: No acute abnormality of the lungs.  No focal airspace opacity. Electronically Signed   By: Eddie Candle M.D.   On: 04/13/2018 20:47        Scheduled Meds: . amLODipine  5 mg Oral Daily  . aspirin EC  81 mg Oral Daily  . famotidine  20 mg Oral QAC breakfast  . heparin  5,000 Units Subcutaneous Q8H  . levothyroxine  200 mcg Oral Q0600  . metoprolol succinate  50 mg Oral Daily  .  pantoprazole  40 mg Oral Daily  . sodium chloride flush  3 mL Intravenous Q12H   Continuous Infusions:   LOS: 1 day    Time spent: 34 minutes.     Hosie Poisson, MD [facility] Pager [phone]  If 7PM-7AM, please contact night-coverage [email or web address] Password [email or web address] 04/15/2018, 11:52 AM

## 2018-04-15 NOTE — TOC Initial Note (Addendum)
Transition of Care [facility]) - Initial/Assessment Note    Patient Details  Name: [name] MRN: [id number] Date of Birth: 1932/09/21  Transition of Care [facility]) CM/SW Contact:    Erenest Rasher, RN Phone Number: 04/15/2018, 1:09 PM  Clinical Narrative:                  Spoke to pt's son, [name] and states he is concerned he will not be able to visit pt at [facility] and is requesting she comes home with [facility]. Agreeable to [facility] for [facility]. Has RW with seat at home. Provided pt with info on private duty aide ([place], Home Instead). Contacted [facility] with new referral for [facility]. Sent message to attending for [facility] orders.      Expected Discharge Plan: [place] Barriers to Discharge: Continued Medical Work up   Patient Goals and CMS Choice Patient states their goals for this hospitalization and ongoing recovery are:: be able to eat and drink CMS Medicare.gov Compare Post Acute Care list provided to:: Patient Represenative (must comment)(son, Allison Quarry) Choice offered to / list presented to : Adult Children  Expected Discharge Plan and Services Expected Discharge Plan: [place]   Discharge Planning Services: CM Consult Post Acute Care Choice: [place] arrangements for the past 2 months: Single Family Home                 DME Arranged: N/A DME Agency: NA HH Arranged: RN, PT, OT, Nurse's Aide, Social Work [employer] Agency: [place]  Prior Living Arrangements/Services Living arrangements for the past 2 months: [place] Lives with:: Adult Children(son ) Patient language and need for interpreter reviewed:: Yes Do you feel safe going back to the place where you live?: Yes      Need for Family Participation in Patient Care: Yes (Comment) Care giver support system in place?: Yes (comment) Current home services: Agricultural consultant) Criminal Activity/Legal Involvement Pertinent to Current Situation/Hospitalization: No -  Comment as needed  Activities of Daily Living Home Assistive Devices/Equipment: None ADL Screening (condition at time of admission) Patient's cognitive ability adequate to safely complete daily activities?: No Is the patient deaf or have difficulty hearing?: No Does the patient have difficulty seeing, even when wearing glasses/contacts?: No Does the patient have difficulty concentrating, remembering, or making decisions?: Yes Patient able to express need for assistance with ADLs?: No Does the patient have difficulty dressing or bathing?: Yes Independently performs ADLs?: No Communication: Needs assistance Is this a change from baseline?: Pre-admission baseline Dressing (OT): Needs assistance Grooming: Independent Feeding: Independent Bathing: Needs assistance Is this a change from baseline?: Change from baseline, expected to last <3 days Toileting: Needs assistance Is this a change from baseline?: Pre-admission baseline In/Out Bed: Needs assistance Does the patient have difficulty walking or climbing stairs?: Yes Weakness of Legs: None Weakness of Arms/Hands: None  Permission Sought/Granted Permission sought to share information with : Case Manager, PCP, Other (comment), Family Supports Permission granted to share information with : Yes, Verbal Permission Granted  Share Information with NAME: Allison Quarry  Permission granted to share info w AGENCY: [place] granted to share info w Relationship: son     Emotional Assessment Appearance:: Appears stated age Attitude/Demeanor/Rapport: Unable to Assess Affect (typically observed): Restless Orientation: : Oriented to Self   Psych Involvement: No (comment)  Admission diagnosis:  Altered mental status, unspecified altered mental status type [R41.82] Patient Active Problem List  Diagnosis Date Noted  . Acute encephalopathy 04/13/2018  . Mild renal insufficiency 04/13/2018  . LFTs abnormal 04/13/2018  .  Hypertensive urgency 04/13/2018  . Protein-calorie malnutrition, severe 03/12/2018  . Renal mass 03/09/2018  . Coronary artery calcification 02/14/2018  . Near syncope 02/02/2018  . Orthostatic hypotension 02/02/2018  . Anemia 02/02/2018  . Suprapubic discomfort 07/06/2017  . Vitamin D deficiency 08/19/2015  . Closed right hip fracture ([place]) 05/30/2015  . Chest pain syndrome 05/30/2015  . Fall at home 05/30/2015  . Closed right femoral fracture ([place]) 05/30/2015  . Displaced fracture of right femoral neck ([place]) 05/30/2015  . Fall   . Cough 07/02/2013  . Other dyspnea and respiratory abnormality 07/02/2013  . History of colon cancer, stage III 07/02/2013  . Chronic cholecystitis with calculus s/p lap chole 11/17/2012 11/19/2012  . Hyponatremia 11/16/2012  . GERD (gastroesophageal reflux disease) 11/16/2012  . Choledocholithiasis with obstruction 11/16/2012  . Calcification of aorta (HCC) 11/16/2012  . Atherosclerosis 11/16/2012  . Hypothyroidism 11/15/2012  . Nonspecific (abnormal) findings on radiological and other examination of gastrointestinal tract 11/08/2012  . S/P right knee replacement 12/22/2010  . Preop cardiovascular exam 12/08/2010  . Essential hypertension 12/08/2010  . Hyperlipidemia 12/08/2010   PCP:  [name], MD Pharmacy:   [facility], [place] [address] Phone: [phone] Fax: [phone]  [id number] [place], [place] [address] Phone: [phone] Fax: [phone], [place], [place] [address] Phone: [phone] Fax: [phone]     Social Determinants of Health (SDOH) Interventions  Readmission Risk Interventions .Marland Kitchen 30 Day Unplanned Readmission Risk Score     ED to Hosp-Admission (Current) from 04/13/2018 in [place] [facility]   30 Day Unplanned Readmission Risk Score (%)  24 Filed at 04/15/2018 1200     This score is the patient's risk of an unplanned readmission within 30 days of being discharged (0 -100%). The score is based on dignosis, age, lab data, medications, orders, and past utilization.   Low:  0-14.9   Medium: 15-21.9   High: 22-29.9   Extreme: 30 and above       .Marland Kitchen 30 Day Unplanned Readmission Risk Score     ED to Hosp-Admission (Current) from 04/13/2018 in [place] [facility]  30 Day Unplanned Readmission Risk Score (%)  24 Filed at 04/15/2018 1200     This score is the patient's risk of an unplanned readmission within 30 days of being discharged (0 -100%). The score is based on dignosis, age, lab data, medications, orders, and past utilization.   Low:  0-14.9   Medium: 15-21.9   High: 22-29.9   Extreme: 30 and above        Readmission Risk Prevention Plan 04/15/2018  Transportation Screening Complete  HRI or [place] Complete  Social Work Consult for [place] Planning/Counseling Complete  Palliative Care Screening Not Applicable  Some recent data might be hidden

## 2018-04-16 LAB — FOLATE RBC
Folate, Hemolysate: >620 ng/mL
Hematocrit: 33.1 % — ABNORMAL LOW (ref 34.0–46.6)

## 2018-04-16 MED ORDER — AMLODIPINE BESYLATE 5 MG PO TABS: 5.0000 mg | ORAL_TABLET | Freq: Every day | ORAL | 0 refills | Status: DC

## 2018-04-16 NOTE — Progress Notes (Signed)
AVS information reviewed with son, [name].  He verbalized understanding.  CM called to make sure home health is set up.  Patient left hospital with son via personal vehicle.  She took AVS and all belongings. Virginia Rochester, RN

## 2018-04-17 ENCOUNTER — Encounter (HOSPITAL_COMMUNITY): Payer: Self-pay | Admitting: Emergency Medicine

## 2018-04-17 ENCOUNTER — Emergency Department (HOSPITAL_COMMUNITY)
Admission: EM | Admit: 2018-04-17 | Discharge: 2018-04-18 | Payer: Medicare Other | Attending: Emergency Medicine | Admitting: Emergency Medicine

## 2018-04-17 DIAGNOSIS — Z7982 Long term (current) use of aspirin: Secondary | ICD-10-CM | POA: Diagnosis not present

## 2018-04-17 DIAGNOSIS — R0781 Pleurodynia: Secondary | ICD-10-CM | POA: Diagnosis present

## 2018-04-17 DIAGNOSIS — R945 Abnormal results of liver function studies: Secondary | ICD-10-CM | POA: Insufficient documentation

## 2018-04-17 DIAGNOSIS — Z96641 Presence of right artificial hip joint: Secondary | ICD-10-CM | POA: Insufficient documentation

## 2018-04-17 DIAGNOSIS — R7989 Other specified abnormal findings of blood chemistry: Secondary | ICD-10-CM | POA: Insufficient documentation

## 2018-04-17 DIAGNOSIS — Z79899 Other long term (current) drug therapy: Secondary | ICD-10-CM | POA: Insufficient documentation

## 2018-04-17 DIAGNOSIS — Z85528 Personal history of other malignant neoplasm of kidney: Secondary | ICD-10-CM | POA: Insufficient documentation

## 2018-04-17 DIAGNOSIS — R6251 Failure to thrive (child): Secondary | ICD-10-CM

## 2018-04-17 DIAGNOSIS — Z96651 Presence of right artificial knee joint: Secondary | ICD-10-CM | POA: Diagnosis not present

## 2018-04-17 DIAGNOSIS — E039 Hypothyroidism, unspecified: Secondary | ICD-10-CM | POA: Insufficient documentation

## 2018-04-17 DIAGNOSIS — Z85038 Personal history of other malignant neoplasm of large intestine: Secondary | ICD-10-CM | POA: Insufficient documentation

## 2018-04-17 DIAGNOSIS — Z91040 Latex allergy status: Secondary | ICD-10-CM | POA: Diagnosis not present

## 2018-04-17 DIAGNOSIS — I10 Essential (primary) hypertension: Secondary | ICD-10-CM | POA: Insufficient documentation

## 2018-04-17 DIAGNOSIS — R627 Adult failure to thrive: Secondary | ICD-10-CM | POA: Insufficient documentation

## 2018-04-17 DIAGNOSIS — E119 Type 2 diabetes mellitus without complications: Secondary | ICD-10-CM | POA: Insufficient documentation

## 2018-04-17 LAB — COMPREHENSIVE METABOLIC PANEL
ALT: 96 U/L — ABNORMAL HIGH (ref 0–44)
ANION GAP: 14 (ref 5–15)
AST: 65 U/L — ABNORMAL HIGH (ref 15–41)
Albumin: 3 g/dL — ABNORMAL LOW (ref 3.5–5.0)
Alkaline Phosphatase: 613 U/L — ABNORMAL HIGH (ref 38–126)
BUN: 21 mg/dL (ref 8–23)
CO2: 26 mmol/L (ref 22–32)
Calcium: 9.6 mg/dL (ref 8.9–10.3)
Chloride: 91 mmol/L — ABNORMAL LOW (ref 98–111)
Creatinine, Ser: 1.32 mg/dL — ABNORMAL HIGH (ref 0.44–1.00)
GFR calc Af Amer: 43 mL/min — ABNORMAL LOW (ref >60)
GFR calc non Af Amer: 37 mL/min — ABNORMAL LOW (ref >60)
Glucose, Bld: 146 mg/dL — ABNORMAL HIGH (ref 70–99)
Potassium: 4.1 mmol/L (ref 3.5–5.1)
SODIUM: 131 mmol/L — AB (ref 135–145)
Total Bilirubin: 0.5 mg/dL (ref 0.3–1.2)
Total Protein: 7.6 g/dL (ref 6.5–8.1)

## 2018-04-17 LAB — CBC WITH DIFFERENTIAL/PLATELET
Abs Immature Granulocytes: 0.03 10*3/uL (ref 0.00–0.07)
Basophils Absolute: 0 10*3/uL (ref 0.0–0.1)
Basophils Relative: 0 %
Eosinophils Absolute: 0.1 10*3/uL (ref 0.0–0.5)
Eosinophils Relative: 1 %
HCT: 48 % — ABNORMAL HIGH (ref 36.0–46.0)
HEMOGLOBIN: 15.1 g/dL — AB (ref 12.0–15.0)
Immature Granulocytes: 0 %
LYMPHS PCT: 8 %
Lymphs Abs: 0.6 10*3/uL — ABNORMAL LOW (ref 0.7–4.0)
MCH: 30.3 pg (ref 26.0–34.0)
MCHC: 31.5 g/dL (ref 30.0–36.0)
MCV: 96.4 fL (ref 80.0–100.0)
Monocytes Absolute: 0.9 10*3/uL (ref 0.1–1.0)
Monocytes Relative: 11 %
NEUTROS ABS: 6.2 10*3/uL (ref 1.7–7.7)
Neutrophils Relative %: 80 %
PLATELETS: 371 10*3/uL (ref 150–400)
RBC: 4.98 MIL/uL (ref 3.87–5.11)
RDW: 14.6 % (ref 11.5–15.5)
WBC: 7.9 10*3/uL (ref 4.0–10.5)
nRBC: 0 % (ref 0.0–0.2)

## 2018-04-17 LAB — URINALYSIS, ROUTINE W REFLEX MICROSCOPIC
Bilirubin Urine: NEGATIVE
Glucose, UA: NEGATIVE mg/dL
Hgb urine dipstick: NEGATIVE
Ketones, ur: NEGATIVE mg/dL
Leukocytes,Ua: NEGATIVE
Nitrite: NEGATIVE
Protein, ur: NEGATIVE mg/dL
Specific Gravity, Urine: 1.013 (ref 1.005–1.030)
pH: 5 (ref 5.0–8.0)

## 2018-04-17 MED ORDER — NORTRIPTYLINE HCL 25 MG PO CAPS
75.0000 mg | ORAL_CAPSULE | Freq: Every day | ORAL | Status: DC
  Administered 2018-04-17: 75 mg via ORAL
  Filled 2018-04-17: qty 3

## 2018-04-17 MED ORDER — MEDROXYPROGESTERONE ACETATE 2.5 MG PO TABS
2.5000 mg | ORAL_TABLET | Freq: Every morning | ORAL | Status: DC
  Administered 2018-04-18: 2.5 mg via ORAL
  Filled 2018-04-17: qty 1

## 2018-04-17 MED ORDER — POLYVINYL ALCOHOL 1.4 % OP SOLN
Freq: Every day | OPHTHALMIC | Status: DC
  Filled 2018-04-17: qty 15

## 2018-04-17 MED ORDER — SODIUM CHLORIDE 0.9 % IV BOLUS
1000.0000 mL | Freq: Once | INTRAVENOUS | Status: AC
  Administered 2018-04-17: 1000 mL via INTRAVENOUS

## 2018-04-17 MED ORDER — AMLODIPINE BESYLATE 5 MG PO TABS
5.0000 mg | ORAL_TABLET | Freq: Every day | ORAL | Status: DC
  Administered 2018-04-17 – 2018-04-18 (×2): 5 mg via ORAL
  Filled 2018-04-17 (×2): qty 1

## 2018-04-17 MED ORDER — ASPIRIN EC 81 MG PO TBEC
81.0000 mg | DELAYED_RELEASE_TABLET | Freq: Every day | ORAL | Status: DC
  Administered 2018-04-17 – 2018-04-18 (×2): 81 mg via ORAL
  Filled 2018-04-17 (×2): qty 1

## 2018-04-17 MED ORDER — ACETAMINOPHEN ER 650 MG PO TBCR: 1300.0000 mg | EXTENDED_RELEASE_TABLET | Freq: Two times a day (BID) | ORAL | Status: DC

## 2018-04-17 MED ORDER — ESTRADIOL 0.5 MG PO TABS: 0.2500 mg | ORAL_TABLET | Freq: Every morning | ORAL | Status: DC

## 2018-04-17 MED ORDER — FAMOTIDINE 20 MG PO TABS
20.0000 mg | ORAL_TABLET | Freq: Every day | ORAL | Status: DC
  Administered 2018-04-18: 20 mg via ORAL
  Filled 2018-04-17: qty 1

## 2018-04-17 MED ORDER — METOPROLOL SUCCINATE ER 25 MG PO TB24
50.0000 mg | ORAL_TABLET | Freq: Every morning | ORAL | Status: DC
  Administered 2018-04-18: 50 mg via ORAL
  Filled 2018-04-17: qty 2

## 2018-04-17 MED ORDER — DOCUSATE SODIUM 100 MG PO CAPS
300.0000 mg | ORAL_CAPSULE | Freq: Every day | ORAL | Status: DC
  Administered 2018-04-17 – 2018-04-18 (×2): 300 mg via ORAL
  Filled 2018-04-17 (×2): qty 3

## 2018-04-17 MED ORDER — LEVOTHYROXINE SODIUM 100 MCG PO TABS
200.0000 ug | ORAL_TABLET | Freq: Every day | ORAL | Status: DC
  Administered 2018-04-18: 200 ug via ORAL
  Filled 2018-04-17: qty 2

## 2018-04-17 MED ORDER — ACETAMINOPHEN 500 MG PO TABS
1000.0000 mg | ORAL_TABLET | Freq: Once | ORAL | Status: AC
  Administered 2018-04-17: 1000 mg via ORAL
  Filled 2018-04-17: qty 2

## 2018-04-17 MED ORDER — ZOLPIDEM TARTRATE 5 MG PO TABS
10.0000 mg | ORAL_TABLET | Freq: Every day | ORAL | Status: DC
  Administered 2018-04-17: 10 mg via ORAL
  Filled 2018-04-17: qty 2

## 2018-04-17 MED ORDER — PANTOPRAZOLE SODIUM 40 MG PO TBEC
40.0000 mg | DELAYED_RELEASE_TABLET | Freq: Every day | ORAL | Status: DC
  Administered 2018-04-18: 40 mg via ORAL
  Filled 2018-04-17: qty 1

## 2018-04-17 MED ORDER — NONFORMULARY OR COMPOUNDED ITEM: Freq: Every day | Status: DC

## 2018-04-17 MED ORDER — ACETAMINOPHEN 500 MG PO TABS
1000.0000 mg | ORAL_TABLET | Freq: Two times a day (BID) | ORAL | Status: DC
  Administered 2018-04-17: 1000 mg via ORAL
  Filled 2018-04-17 (×2): qty 2

## 2018-04-17 MED ORDER — FERROUS SULFATE 325 (65 FE) MG PO TABS
325.0000 mg | ORAL_TABLET | Freq: Two times a day (BID) | ORAL | Status: DC
  Administered 2018-04-17: 325 mg via ORAL
  Filled 2018-04-17 (×2): qty 1

## 2018-04-17 NOTE — ED Provider Notes (Signed)
Care assumed at shift change from Billie Lade, pending SNF placement. See her note for full HPI and workup. Briefly, pt presenting after hospital discharge yesterday - initially admitted for confusion/encephalopathy. At discharge they offered family SNF but son reportedly did not want her to go at the time, and opted for home health.  Patient woke up this morning then complained of chronic rib pain and son was unable to assist her.  Therefore, she was brought back to the ED.  Patient is ambulating in the ED. currently pending disposition.   PT recommending SNF placement. Currently pending case management placement to SNF.  It has been reported that multiple facilities are currently unwilling to accept patients without negative Covid testing.  Physical Exam  BP (!) 169/93   Pulse 92   Temp 97.8 F (36.6 C) (Oral)   Resp 19   LMP 01/17/1994   SpO2 97%   Physical Exam Vitals signs and nursing note reviewed.  Constitutional:      Appearance: She is well-developed.  HENT:     Head: Normocephalic and atraumatic.  Eyes:     Conjunctiva/sclera: Conjunctivae normal.  Cardiovascular:     Rate and Rhythm: Normal rate.  Pulmonary:     Effort: Pulmonary effort is normal.  Neurological:     Mental Status: She is alert.  Psychiatric:        Mood and Affect: Mood normal.        Behavior: Behavior normal.     ED Course/Procedures   Clinical Course as of Apr 17 1654  Tue Apr 17, 2018  0954 Couldn't stand up due to pain in ribs 1230 HH tylenol 2 qam qpm last night none today pain since surgery both 130 pm slept on chair 730 pm x falls    [CG]  1149 Similar to previous  Sodium(!): 131 [CG]  1149 Creatinine(!): 1.32 [CG]  1150 Chronically elevated  Alkaline Phosphatase(!): 613 [CG]    Clinical Course User Index [CG] Kinnie Feil, PA-C    Procedures  MDM  Patient boarding overnight awaiting placement to SNF facility.  [facility] was contacted and is reviewing patient's  records.  If patient not accepted at these facilities and is requiring COVID testing prior to placement, son is currently agreeable to bring patient home until such results.   Oncoming provider made aware of this plan.    Robinson, [place] N, PA-C 04/17/18 2354    Merrily Pew, MD 04/20/18 1550

## 2018-04-17 NOTE — Progress Notes (Signed)
CSW received a call from pt's son asking for an update.  CSW stated CSW could neither confirm nor deny pt's presence in the ED at this time.  CSW took pt's son's info and stated he would call pt's son back.  CSW spoke to [facility] RN CM who was following up pt's D/C from the [facility] ED and is assisting with D/C and continuing with D/C process begun by the 1st shift [facility] ED [facility] RN CM.  CSW will continue to follow for D/C needs.  Alphonse Guild. Rene Sizelove, LCSW, LCAS, CSI Transitions of Care Clinical Social Worker Care Coordination Department Ph: [phone]

## 2018-04-17 NOTE — ED Notes (Addendum)
Patients son [name] called for update. Informed him patient is boarding in the ED at this time until placement in a facility. Patient's son agrees with plan and requests to be called once patient is accepted into a facility.   [phone]

## 2018-04-17 NOTE — Evaluation (Signed)
Physical Therapy Evaluation Patient Details Name: [name] MRN: [id number] DOB: 1933-01-08 Today's Date: 04/17/2018   History of Present Illness  84yo admitted to [facility] and discharged home 2 days ago now readmitted due to abdominal, flank, and back pain. She has PMH of PVD, fibromyalgia, colon cancer, had gross hematuria and was found to have a large left renal mass with an ipsalateral adrenal metastasis. s/p radical nephrectomy 03/09/18.  Clinical Impression   Pt admitted with above diagnosis. Pt currently with functional limitations due to general weakness, deconditioning, increased abdominal/back/feet pain, decreased standing tolerance, and decreased standing balance making gait difficult and unsafe. Pt will benefit from skilled PT to increase their independence and safety. PT recommending discharge to SNF and PT spoke with her son who agrees to this. Her son requests the following SNF placements 1) [facility], 2) [place], 3) [place]  Sitting BP 171/107 Post mobility BP 190/148     Follow Up Recommendations SNF(son requesting [facility])    Equipment Recommendations  None recommended by PT    Recommendations for Other Services       Precautions / Restrictions Precautions Precautions: Fall Restrictions Weight Bearing Restrictions: No      Mobility  Bed Mobility Overal bed mobility: Needs Assistance Bed Mobility: Supine to Sit     Supine to sit: Supervision     General bed mobility comments: Increased time but no physical assist  Transfers Overall transfer level: Needs assistance Equipment used: 1 person hand held assist Transfers: Sit to/from Stand Sit to Stand: Min assist            Ambulation/Gait             General Gait Details: attempted to take steps but says her feet were too painful and sat back down (did march in place one time bilat needing mod A for balance)         Balance Overall balance  assessment: Needs assistance   Sitting balance-Leahy Scale: Fair       Standing balance-Leahy Scale: Poor Standing balance comment: needs UE support                  Pertinent Vitals/Pain Pain Assessment: Faces Faces Pain Scale: Hurts even more Pain Location: abdomin, flank, back, feet Pain Descriptors / Indicators: Aching Pain Intervention(s): Limited activity within patient's tolerance;Monitored during session;Repositioned    Home Living Family/patient expects to be discharged to:: Skilled nursing facility(spoke on the phone with son who agrees to this recomendation) Living Arrangements: Children Available Help at Discharge: Family Type of Home: Mobile home Home Access: Ramped entrance     Home Layout: One level Home Equipment: Environmental consultant - 4 wheels;Cane - single point;Bedside commode Additional Comments: Information taken from previous stay, pt not reliable historian at time of eval    Prior Function Level of Independence: Independent with assistive device(s)         Comments: uses rollator, bathes and dresses independently     Hand Dominance        Extremity/Trunk Assessment   Upper Extremity Assessment Upper Extremity Assessment: Generalized weakness    Lower Extremity Assessment Lower Extremity Assessment: Generalized weakness(overall 4/5 MMT grossly tested in sitting)    Cervical / Trunk Assessment Cervical / Trunk Assessment: Kyphotic  Communication   Communication: No difficulties  Cognition Arousal/Alertness: Awake/alert Behavior During Therapy: Flat affect Overall Cognitive Status: No family/caregiver present to determine baseline cognitive functioning  General Comments: pt teary stating things are just confusing and she misses her babies and wants to have visitors.      General Comments General comments (skin integrity, edema, etc.): mild edema noted in feet and lower legs, she has painful calf squeeze     PT  Problem List Decreased strength;Decreased activity tolerance;Decreased balance;Decreased mobility;Decreased cognition;Decreased knowledge of use of DME;Decreased safety awareness;Pain       PT Treatment Interventions DME instruction;Gait training;Functional mobility training;Therapeutic activities;Therapeutic exercise;Balance training;Patient/family education;Neuromuscular re-education    PT Goals (Current goals can be found in the Care Plan section)  Acute Rehab PT Goals Patient Stated Goal: No goals expressed PT Goal Formulation: With family Time For Goal Achievement: 05/01/18 Potential to Achieve Goals: Fair    Frequency Min 3X/week   Barriers to discharge Decreased caregiver support         AM-PAC PT "6 Clicks" Mobility  Outcome Measure Help needed turning from your back to your side while in a flat bed without using bedrails?: None Help needed moving from lying on your back to sitting on the side of a flat bed without using bedrails?: A Little Help needed moving to and from a bed to a chair (including a wheelchair)?: A Lot Help needed standing up from a chair using your arms (e.g., wheelchair or bedside chair)?: A Little Help needed to walk in hospital room?: A Lot Help needed climbing 3-5 steps with a railing? : A Lot 6 Click Score: 16    End of Session   Activity Tolerance: Patient limited by pain Patient left: in bed;with call bell/phone within reach   PT Visit Diagnosis: Unsteadiness on feet (R26.81);Muscle weakness (generalized) (M62.81);Difficulty in walking, not elsewhere classified (R26.2)    Time: 3953-2023 PT Time Calculation (min) (ACUTE ONLY): 20 min   Charges:   PT Evaluation $PT Eval Moderate Complexity: 1 Mod          Elsie Ra, PT,DPT [place] Office [phone]   Debbe Odea 04/17/2018, 5:03 PM

## 2018-04-17 NOTE — Discharge Summary (Addendum)
Physician Discharge Summary  [name] ZGY:[id number] DOB: October 20, 1932 DOA: 04/13/2018  PCP: [name], MD  Admit date: 04/13/2018 Discharge date: 04/16/2018  Admitted From: home.  Disposition:  Family refused SNF. Home with home health.   Recommendations for Outpatient Follow-up:  1. Follow up with PCP in 1-2 weeks 2. Please obtain BMP/CBC in one week Please follow up with neurology as outpatient.   Discharge Condition:guarded.  CODE STATUS: full code.  Diet recommendation: Heart Healthy  Brief/Interim Summary:  [name] a 85 y.o.femalewith medical history significant forhypertension, depression, thyroidism, chronic pain, and urothelial carcinoma status post left nephrectomy, now presenting to the emergency department for evaluation of progressive confusion. Patient's family has been concerned for increasingconfusion with paranoia and apparent hallucinations. Patient suspects family members of trying to harm her, has had waxing and waning confusion that has been overall worsening, and was evaluated in the emergency department 2 days ago where she had a negative MRI brain and returned home. Patient denies any fevers, chest pain, abdominal pain, cough, or shortness of breath. Patient is worried that her husband is in the emergency department and trying to harm her.  Discharge Diagnoses:  Principal Problem:   Acute encephalopathy Active Problems:   Essential hypertension   Hypothyroidism   Hyponatremia   Mild renal insufficiency   LFTs abnormal   Hypertensive urgency   Acute encephalopathy: probably metabolic encephalopathy.  Unclear etiology, suspect from dehydration. Pt is still confused. She says her son dropped her off at ED and left. She denies any chest pain or sob. she reports feeing weak and tired. She denies any dysuria.  Urine cultures are negative. Marland Kitchen UA is negative for infection. CXR is negative for infection.  TSH wnl B12 is 601 FOLATE  pending.  Ammonia level. Wnl.  Get PT/OT evaluation.    Urothelial cell ca. With mets to the liver: Status-post left nephrectomy and currently following with oncology for observation   Hyponatremia: probably from dehydration.    AKI:  Improving with  hydration.    Hypothyroidism: TSH wnl .  Resume synthroid.    Hypertensive urgency: Improved.    Discharge Instructions  Discharge Instructions    Diet - low sodium heart healthy   Complete by:  As directed    Discharge instructions   Complete by:  As directed    Follow up with PCP in one week.     Allergies as of 04/16/2018      Reactions   Tape Rash   Blisters   Latex Rash, Other (See Comments)   blister   Lidocaine Nausea And Vomiting   Sulfa Antibiotics Nausea And Vomiting      Medication List    STOP taking these medications   simvastatin 20 MG tablet Commonly known as:  ZOCOR     TAKE these medications   amLODipine 5 MG tablet Commonly known as:  NORVASC Take 1 tablet (5 mg total) by mouth daily.   aspirin EC 81 MG tablet Take 81 mg by mouth daily.   CALTRATE 600 PLUS-VIT D PO Take 1 tablet by mouth 2 (two) times daily.   CENTRUM SILVER ULTRA WOMENS PO Take 1 tablet by mouth every evening.   PRESERVISION AREDS PO Take 2 capsules by mouth daily.   docusate sodium 100 MG capsule Commonly known as:  COLACE Take 300 mg by mouth daily.   esomeprazole 40 MG capsule Commonly known as:  NEXIUM Take 40 mg by mouth 2 (two) times daily at 8 am  and 10 pm.   estradiol 0.5 MG tablet Commonly known as:  ESTRACE Take 0.25 mg by mouth every morning.   famotidine 20 MG tablet Commonly known as:  PEPCID Take 20 mg by mouth daily before breakfast.   ferrous sulfate 325 (65 FE) MG tablet Take 325 mg by mouth 2 (two) times daily.   levothyroxine 200 MCG tablet Commonly known as:  SYNTHROID, LEVOTHROID Take 200 mcg by mouth daily before breakfast.   medroxyPROGESTERone 5 MG  tablet Commonly known as:  PROVERA Take 2.5 mg by mouth every morning.   metoprolol succinate 50 MG 24 hr tablet Commonly known as:  TOPROL-XL Take 50 mg by mouth every morning.   nortriptyline 25 MG capsule Commonly known as:  PAMELOR Take 75 mg by mouth daily.   SYSTANE ULTRA OP Place 2 drops into both eyes at bedtime.   Tylenol 8 Hour Arthritis Pain 650 MG CR tablet Generic drug:  acetaminophen Take 1,300 mg by mouth 2 (two) times daily.   vitamin B-12 1000 MCG tablet Commonly known as:  CYANOCOBALAMIN Take 1,000 mcg by mouth daily.   vitamin C 500 MG tablet Commonly known as:  ASCORBIC ACID Take 500 mg by mouth daily.   Vitamin D (Ergocalciferol) 1.25 MG (50000 UT) Caps capsule Commonly known as:  DRISDOL take ONE CAPSULE BY MOUTH every 7 DAYS What changed:    how much to take  how to take this  when to take this  additional instructions   vitamin E 400 UNIT capsule Take 800 Units by mouth daily.   zolpidem 10 MG tablet Commonly known as:  AMBIEN Take 1 tablet (10 mg total) by mouth at bedtime.      Follow-up Information    [facility] Follow up.   Specialty:  Home Health Services Why:  Home Health RN, Physical Therapy, aide, Social Worker-agency will call to arrange initial visit Contact information: [place] [address] [phone]        [name], MD. Schedule an appointment as soon as possible for a visit in 1 week(s).   Specialty:  Family Medicine Contact information: [place] [address] [phone]        [facility] Follow up in 1 week(s).   Contact information: [place], [place] [address] [phone]         Allergies  Allergen Reactions  . Tape Rash    Blisters  . Latex Rash and Other (See Comments)    blister  . Lidocaine Nausea And Vomiting  . Sulfa Antibiotics Nausea And Vomiting     Consultations:  None.    Procedures/Studies: Dg Chest 2 View  Result Date: 04/13/2018 CLINICAL DATA:  AMS EXAM: CHEST - 2 VIEW COMPARISON:  04/11/2018 FINDINGS: The heart size and mediastinal contours are within normal limits. Both lungs are clear. The visualized skeletal structures are unremarkable. IMPRESSION: No acute abnormality of the lungs.  No focal airspace opacity. Electronically Signed   By: Eddie Candle M.D.   On: 04/13/2018 20:47   Dg Chest 2 View  Result Date: 04/11/2018 CLINICAL DATA:  Altered mental status EXAM: CHEST - 2 VIEW COMPARISON:  PET-CT 01/18/2018, chest x-ray 12/20/2017 FINDINGS: There is no focal consolidation. There is no pleural effusion or pneumothorax. The heart and mediastinal contours are unremarkable. The osseous structures are unremarkable. IMPRESSION: No active cardiopulmonary disease. Electronically Signed   By: Kathreen Devoid   On: 04/11/2018  19:08   Mr Jeri Cos WN Contrast  Result Date: 04/11/2018 CLINICAL DATA:  Urothelial carcinoma.  Confusion.  Staging. EXAM: MRI HEAD WITHOUT AND WITH CONTRAST TECHNIQUE: Multiplanar, multiecho pulse sequences of the brain and surrounding structures were obtained without and with intravenous contrast. CONTRAST:  Gadavist 5 mL. COMPARISON:  None. FINDINGS: Brain: No evidence for acute infarction, hemorrhage, mass lesion, hydrocephalus, or extra-axial fluid. Generalized atrophy. Moderate T2 and FLAIR hyperintensities in the white matter, likely small vessel disease. Chronic BILATERAL cerebellar infarcts. Post infusion, no abnormal enhancement of the brain or meninges. Vascular: Flow voids are maintained throughout the carotid, basilar, and vertebral arteries. There are no areas of chronic hemorrhage. RIGHT vertebral dominant. Skull and upper cervical spine: Unremarkable visualized calvarium, skullbase, and cervical vertebrae. Pituitary, pineal, cerebellar tonsils unremarkable. No upper cervical cord lesions. Suspect C4-C5  spondylosis. Sinuses/Orbits: No orbital masses or proptosis. Globes appear symmetric. Sinuses appear well aerated, without evidence for air-fluid level. Other: None. IMPRESSION: Atrophy and small vessel disease, not unexpected for age. No acute intracranial findings. No evidence for metastatic disease to the brain or surrounding structures. Electronically Signed   By: Staci Righter M.D.   On: 04/11/2018 15:50       Subjective: No new complaints.   Discharge Exam: Vitals:   04/16/18 0542 04/16/18 0552  BP: (!) 166/94 (!) 165/94  Pulse: 92 94  Resp:    Temp:    SpO2:     Vitals:   04/15/18 2048 04/16/18 0539 04/16/18 0542 04/16/18 0552  BP: (!) 144/95 (!) 167/101 (!) 166/94 (!) 165/94  Pulse: 99 96 92 94  Resp: 20 (!) 23    Temp: 98.4 F (36.9 C) 97.6 F (36.4 C)    TempSrc: Oral     SpO2: 96% 98%    Weight:      Height:        General: Pt is alert, awake, not in acute distress Cardiovascular: RRR, S1/S2 +, no rubs, no gallops Respiratory: CTA bilaterally, no wheezing, no rhonchi Abdominal: Soft, NT, ND, bowel sounds + Extremities: no edema, no cyanosis    The results of significant diagnostics from this hospitalization (including imaging, microbiology, ancillary and laboratory) are listed below for reference.     Microbiology: Recent Results (from the past 240 hour(s))  Urine Culture     Status: Abnormal   Collection Time: 04/11/18  9:16 PM  Result Value Ref Range Status   Specimen Description   Final    URINE, RANDOM Performed at [facility] [address], [place], [place] [address]    Special Requests   Final    NONE Performed at [facility], [place] [address] [place], [place] [address]    Culture MULTIPLE SPECIES PRESENT, SUGGEST RECOLLECTION (A)  Final   Report Status 04/12/2018 FINAL  Final  Urine Culture     Status: None   Collection Time: 04/13/18  8:23 PM  Result Value Ref Range Status   Specimen Description    Final    URINE, RANDOM Performed at [facility] [address], [place], [place] [address]    Special Requests   Final    NONE Performed at [facility], [place] [address], [place], [place] [address]    Culture   Final    Multiple bacterial morphotypes present, none predominant. Suggest appropriate recollection if clinically indicated.   Report Status 04/15/2018 FINAL  Final     Labs: BNP (last 3 results) Recent Labs  12/28/17 1251  BNP 02.7   Basic Metabolic Panel: Recent Labs  Lab 04/11/18 1920 04/13/18 2017 04/14/18 0426 04/15/18 0939 04/17/18 1022  NA 132* 133* 133* 131* 131*  K 4.4 4.0 4.5 3.9 4.1  CL 96* 97* 98 96* 91*  CO2 28 26 25 23 26   GLUCOSE 177* 116* 159* 154* 146*  BUN 28* 26* 20 13 21   CREATININE 1.05* 1.06* 0.89 0.80 1.32*  CALCIUM 9.1 9.6 9.1 9.2 9.6   Liver Function Tests: Recent Labs  Lab 04/11/18 1920 04/13/18 2017 04/14/18 0426 04/15/18 0939 04/17/18 1022  AST 80* 110* 95* 89* 65*  ALT 98* 124* 107* 118* 96*  ALKPHOS 473* 626* 555* 667* 613*  BILITOT 0.3 0.6 0.7 0.6 0.5  PROT 7.5 8.4* 7.3 7.9 7.6  ALBUMIN 3.0* 3.5 3.2* 3.3* 3.0*   No results for input(s): LIPASE, AMYLASE in the last 168 hours. Recent Labs  Lab 04/13/18 2023  AMMONIA 34   CBC: Recent Labs  Lab 04/11/18 1920 04/13/18 2017 04/14/18 0426 04/17/18 1022  WBC 8.4 10.1 10.7* 7.9  NEUTROABS 6.1 7.3 8.5* 6.2  HGB 11.0* 12.1 11.3* 15.1*  HCT 35.8* 39.0 36.8  33.1* 48.0*  MCV 99.7 100.5* 99.2 96.4  PLT 383 441* 390 371   Cardiac Enzymes: No results for input(s): CKTOTAL, CKMB, CKMBINDEX, TROPONINI in the last 168 hours. BNP: Invalid input(s): POCBNP CBG: No results for input(s): GLUCAP in the last 168 hours. D-Dimer No results for input(s): DDIMER in the last 72 hours. Hgb A1c No results for input(s): HGBA1C in the last 72 hours. Lipid Profile No results for input(s): CHOL, HDL, LDLCALC, TRIG, CHOLHDL, LDLDIRECT in the  last 72 hours. Thyroid function studies No results for input(s): TSH, T4TOTAL, T3FREE, THYROIDAB in the last 72 hours.  Invalid input(s): FREET3 Anemia work up No results for input(s): VITAMINB12, FOLATE, FERRITIN, TIBC, IRON, RETICCTPCT in the last 72 hours. Urinalysis    Component Value Date/Time   COLORURINE YELLOW 04/17/2018 1017   APPEARANCEUR CLEAR 04/17/2018 1017   LABSPEC 1.013 04/17/2018 1017   PHURINE 5.0 04/17/2018 1017   GLUCOSEU NEGATIVE 04/17/2018 1017   HGBUR NEGATIVE 04/17/2018 1017   BILIRUBINUR NEGATIVE 04/17/2018 1017   BILIRUBINUR neg 07/29/2013 0952   KETONESUR NEGATIVE 04/17/2018 1017   PROTEINUR NEGATIVE 04/17/2018 1017   UROBILINOGEN negative 07/29/2013 0952   UROBILINOGEN 0.2 12/24/2010 0729   NITRITE NEGATIVE 04/17/2018 1017   LEUKOCYTESUR NEGATIVE 04/17/2018 1017   Sepsis Labs Invalid input(s): PROCALCITONIN,  WBC,  LACTICIDVEN Microbiology Recent Results (from the past 240 hour(s))  Urine Culture     Status: Abnormal   Collection Time: 04/11/18  9:16 PM  Result Value Ref Range Status   Specimen Description   Final    URINE, RANDOM Performed at [facility], [place] [address], [place], [place] [address]    Special Requests   Final    NONE Performed at [facility], [place] [address], [place], [place] [address]    Culture MULTIPLE SPECIES PRESENT, SUGGEST RECOLLECTION (A)  Final   Report Status 04/12/2018 FINAL  Final  Urine Culture     Status: None   Collection Time: 04/13/18  8:23 PM  Result Value Ref Range Status   Specimen Description   Final    URINE, RANDOM Performed at [facility] [address] [place],  [address]    Special Requests   Final    NONE Performed at [facility], [place] [address]  [place], [place] [address]    Culture   Final    Multiple bacterial morphotypes present, none predominant. Suggest appropriate recollection if clinically  indicated.   Report Status 04/15/2018 FINAL  Final     Time coordinating discharge: 31 minutes  SIGNED:   Hosie Poisson, MD  [facility] 04/17/2018, 5:59 PM Pager   If 7PM-7AM, please contact night-coverage [email or web address] Password [email or web address]

## 2018-04-17 NOTE — ED Provider Notes (Addendum)
[facility] EMERGENCY DEPARTMENT Provider Note   CSN: [id number] Arrival date & time: 04/17/18  3875    History   Chief Complaint No chief complaint on file.   HPI [name] is a 85 y.o. female with history of hypertension, hypothyroidism, depression, chronic pain syndrome, kidney cancer status post left nephrectomy with mets to liver, renal insufficiency, elevated LFTs brought to the ED from home by EMS for pain.  Per EMS, son reported pt not acting herself and complaining of pain.  Pt oriented to self, place only.  Follows commands. Patient points to bilateral lower ribs and back.  It hurts when she moves.  She has no pain when she is laying down or sitting still.  Pain has been there for "a while".  When asked what made her son call 911 today she started crying and stated "because he does not want to take care of me anymore".  Denies fever, cough, shortness of breath, chest pain, vomiting, diarrhea, abdominal pain.  No interventions. Admitted on 3/29 for progressive confusion, initially planned to discharge to SNF but family requested home with [facility] aide due to SNF visiting restrictions. Will contact son for more history.   1000: Long discussion with son. Pt arrived home yesterday\after dc, slept on the couch all day, went to bed and woke up this morning couldn't get out of bed due to rib/back pain, screamed out in pain when son tried to stand her, she fell back down complained of dizziness. Pain since surgery 02/2018 but never this bad per son. No falls, fevers while at home. Son chose home with [facility] at discharge yesterday instead of SNF due to restrictions, but now would like placement. Will attempt ambulation.     HPI  Past Medical History:  Diagnosis Date  . Abdominal pain   . Anemia   . Calcification of aorta (HCC) 11/16/2012  . Coronary artery calcification 02/14/2018   cardiologist-- dr t. Oval Linsey  . Depression   . Diabetes mellitus type 2,  diet-controlled ([place])    pt denies  . DJD (degenerative joint disease)   . DM II (diabetes mellitus, type II), controlled 11/16/2012  . Dysuria   . Epistaxis   . Family history of adverse reaction to anesthesia    sister--- ponv  . Fibromyalgia   . GERD (gastroesophageal reflux disease)   . Goiter   . Gross hematuria   . Headache(784.0)    tension headache daily per pt   . Heart murmur   . Hiatal hernia   . History of vertebral compression fracture   . Hypertension   . Hypothyroidism    endocrinologist-- dr Chalmers Cater  . Left adrenal mass ([place])   . Left renal mass   . Left thyroid nodule    hx biopsy 2013, benign  . Macular degeneration    both  left > right  . Osteoarthritis   . Osteoporosis   . Peripheral vascular disease ([place])    varicose veins in left arm   . 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 PONV (postoperative nausea and vomiting)   . Renal cell cancer, left [facility])    oncologist-- dr Alen Blew  . TMJ syndrome    right   . UTI (urinary tract infection) 02/28/2018  .  Wears glasses     Patient Active Problem List   Diagnosis Date Noted  . Acute encephalopathy 04/13/2018  . Mild renal insufficiency 04/13/2018  . LFTs abnormal 04/13/2018  . Hypertensive urgency 04/13/2018  . Protein-calorie malnutrition, severe 03/12/2018  . Renal mass 03/09/2018  . Coronary artery calcification 02/14/2018  . Near syncope 02/02/2018  . Orthostatic hypotension 02/02/2018  . Anemia 02/02/2018  . Suprapubic discomfort 07/06/2017  . Vitamin D deficiency 08/19/2015  . Closed right hip fracture ([place]) 05/30/2015  . Chest pain syndrome 05/30/2015  . Fall at home 05/30/2015  . Closed right femoral fracture ([place]) 05/30/2015  . Displaced fracture of right femoral neck ([place])  05/30/2015  . Fall   . Cough 07/02/2013  . Other dyspnea and respiratory abnormality 07/02/2013  . History of colon cancer, stage III 07/02/2013  . Chronic cholecystitis with calculus s/p lap chole 11/17/2012 11/19/2012  . Hyponatremia 11/16/2012  . GERD (gastroesophageal reflux disease) 11/16/2012  . Choledocholithiasis with obstruction 11/16/2012  . Calcification of aorta (HCC) 11/16/2012  . Atherosclerosis 11/16/2012  . Hypothyroidism 11/15/2012  . Nonspecific (abnormal) findings on radiological and other examination of gastrointestinal tract 11/08/2012  . S/P right knee replacement 12/22/2010  . Preop cardiovascular exam 12/08/2010  . Essential hypertension 12/08/2010  . Hyperlipidemia 12/08/2010    Past Surgical History:  Procedure Laterality Date  . APPENDECTOMY  age 54  . CATARACT EXTRACTION W/ INTRAOCULAR LENS  IMPLANT, BILATERAL  2004  approx.  . CHOLECYSTECTOMY N/A 11/17/2012   Procedure: LAPAROSCOPIC CHOLECYSTECTOMY;  Surgeon: Leighton Ruff, MD;  Location: [facility];  Service: General;  Laterality: N/A;  . COLONOSCOPY W/ BIOPSIES  09/25/2002, 09/18/2001  . ERCP N/A 11/14/2012   Procedure: ENDOSCOPIC RETROGRADE CHOLANGIOPANCREATOGRAPHY (ERCP);  Surgeon: Jeryl Columbia, MD;  Location: [facility];  Service: Endoscopy;  Laterality: N/A;  . ESOPHAGOGASTRODUODENOSCOPY ENDOSCOPY    . KYPHOPLASTY  01/2011  . LAPAROSCOPIC RIGHT HEMI COLECTOMY  10-03-2001    dr Hassell Done @[facility]   . [place]  2000  . porta catheter placement     REMOVAL 2005  . ROBOT ASSISTED LAPAROSCOPIC NEPHRECTOMY Left 03/09/2018   Procedure: XI ROBOTIC ASSISTED LAPAROSCOPIC NEPHRECTOMY;  Surgeon: Cleon Gustin, MD;  Location: [facility];  Service: Urology;  Laterality: Left;  3 HRS  . TEMPOROMANDIBULAR JOINT SURGERY Right 1990s  . TONSILLECTOMY  child  . TOTAL HIP ARTHROPLASTY Right 05/30/2015   Procedure: TOTAL HIP ARTHROPLASTY ANTERIOR APPROACH;  Surgeon: Rod Can, MD;  Location: [facility];  Service:  Orthopedics;  Laterality: Right;  . TOTAL KNEE ARTHROPLASTY  12/20/2010   Procedure: TOTAL KNEE ARTHROPLASTY;  Surgeon: Mauri Pole;  Location: [facility];  Service: Orthopedics;  Laterality: Right;  . TOTAL KNEE ARTHROPLASTY Right 12-20-2010  dr Alvan Dame @[facility]   . UPPER GASTROINTESTINAL ENDOSCOPY  09/18/2001     OB History    Gravida  1   Para  1   Term  1   Preterm  0   AB  0   Living  1     SAB  0   TAB  0   Ectopic  0   Multiple  0   Live Births  1            Home Medications    Prior to Admission medications   Medication Sig Start Date End Date Taking? Authorizing Provider  acetaminophen (TYLENOL 8 HOUR ARTHRITIS PAIN) 650 MG CR tablet Take 1,300 mg by mouth 2 (two) times daily.     [provider]  amLODipine (NORVASC) 5 MG tablet Take 1 tablet (5 mg total) by mouth daily. 04/16/18   Hosie Poisson, MD  aspirin EC 81 MG tablet Take 81 mg by mouth daily.    [provider]  Calcium-Vitamin D (CALTRATE 600 PLUS-VIT D PO) Take 1 tablet by mouth 2 (two) times daily.     [provider]  docusate sodium (COLACE) 100 MG capsule Take 300 mg by mouth daily.     [provider]  esomeprazole (NEXIUM) 40 MG capsule Take 40 mg by mouth 2 (two) times daily at 8 am and 10 pm.     [provider]  estradiol (ESTRACE) 0.5 MG tablet Take 0.25 mg by mouth every morning.  05/16/16   [provider]  famotidine (PEPCID) 20 MG tablet Take 20 mg by mouth daily before breakfast.     [provider]  ferrous sulfate 325 (65 FE) MG tablet Take 325 mg by mouth 2 (two) times daily.    [provider]  levothyroxine (SYNTHROID, LEVOTHROID) 200 MCG tablet Take 200 mcg by mouth daily before breakfast.    [provider]  medroxyPROGESTERone (PROVERA) 5 MG tablet Take 2.5 mg by mouth every morning.     [provider]  metoprolol succinate (TOPROL-XL) 50 MG 24 hr tablet Take 50 mg by mouth every morning.      [provider]  Multiple Vitamins-Minerals (CENTRUM SILVER ULTRA WOMENS PO) Take 1 tablet by mouth every evening.     [provider]  Multiple Vitamins-Minerals (PRESERVISION AREDS PO) Take 2 capsules by mouth daily.     [provider]  nortriptyline (PAMELOR) 25 MG capsule Take 75 mg by mouth daily.     [provider]  Polyethyl Glycol-Propyl Glycol (SYSTANE ULTRA OP) Place 2 drops into both eyes at bedtime.    [provider]  vitamin B-12 (CYANOCOBALAMIN) 1000 MCG tablet Take 1,000 mcg by mouth daily.    [provider]  vitamin C (ASCORBIC ACID) 500 MG tablet Take 500 mg by mouth daily.     [provider]  Vitamin D, Ergocalciferol, (DRISDOL) 50000 units CAPS capsule take ONE CAPSULE BY MOUTH every 7 DAYS Patient taking differently: Take 50,000 Units by mouth every 30 (thirty) days. 1st day of each month 08/19/15   Kem Boroughs, FNP  vitamin E 400 UNIT capsule Take 800 Units by mouth daily.     [provider]  zolpidem (AMBIEN) 10 MG tablet Take 1 tablet (10 mg total) by mouth at bedtime. 06/01/15   Theodis Blaze, MD    Family History Family History  Problem Relation Age of Onset  . Heart disease Mother        Enlarged heart  . Arrhythmia Mother   . Diabetes Mother   . Heart attack Mother   . Heart disease Brother        MI at unknown age  . Anesthesia problems Brother   . Heart disease Father   . Heart attack Father   . Cancer Son        non-hodgkins lymphoma  . Stroke Sister   . Heart attack Other        MI at age 68  . Heart disease Brother        CABG  . AAA (abdominal aortic aneurysm) Brother   . CAD Brother     Social History Social History   Tobacco Use  . Smoking status: Never Smoker  .  Smokeless tobacco: Never Used  Substance Use Topics  . Alcohol use: No  . Drug use: No     Allergies   Tape; Latex; Lidocaine; and Sulfa antibiotics   Review of Systems Review of Systems   Musculoskeletal: Positive for back pain.  All other systems reviewed and are negative.    Physical Exam Updated Vital Signs BP (!) 170/96   Pulse 96   Temp 97.8 F (36.6 C) (Oral)   Resp 20   LMP 01/17/1994   SpO2 99%   Physical Exam Vitals signs and nursing note reviewed.  Constitutional:      Appearance: She is well-developed.     Comments: Non toxic. Awake. NAD.  HENT:     Head: Normocephalic and atraumatic.     Nose: Nose normal.     Mouth/Throat:     Comments: MMM Eyes:     Conjunctiva/sclera: Conjunctivae normal.     Pupils: Pupils are equal, round, and reactive to light.  Neck:     Musculoskeletal: Normal range of motion.     Comments: c-spine: no midline tenderness. Flexion without pain, no meningismus.  Cardiovascular:     Rate and Rhythm: Normal rate and regular rhythm.     Comments: 1+ radial and DP pulses bilaterally. No LE edema or calf tenderness.  Pulmonary:     Effort: Pulmonary effort is normal.     Breath sounds: Normal breath sounds.     Comments: Mild, unreliable tenderness to bilateral anterior/lateral rib cages.  Normal WOB.  Chest:     Chest wall: Tenderness present.  Abdominal:     General: Bowel sounds are normal.     Palpations: Abdomen is soft.     Tenderness: There is no abdominal tenderness.     Comments: No G/R/R. No suprapubic or CVA tenderness. Negative Murphy's and McBurney's. Active BS to lower quadrants.   Musculoskeletal: Normal range of motion.     Comments: TL spine: No midline or paraspinal muscle tenderness. Sits up in bed without assistance or pain.   Skin:    General: Skin is warm and dry.     Capillary Refill: Capillary refill takes less than 2 seconds.  Neurological:     Mental Status: She is alert and oriented to person, place, and time.     Comments: Alert and oriented to self, place only. Follows commands.  Speech is fluent without dysarthria or dysphasia. Strength 5/5 with hand grip and ankle F/E.   Sensation to  light touch intact in face, hands and feet. Stands at side of bed without spotter, no trunk sway.  Wide stance, short steps needs 1 person assist.  No pronator drift. No leg drop. SLR bilaterally with 5 sec hold. Normal finger-to-nose and feet tapping.  CN I and II not tested CN III, IV, VI PEERL and EOMs intact bilaterally CN V light touch intact in all 3 divisions of trigeminal nerve CN VII facial movements symmetric CN VIII not tested CN IX, X no uvula deviation, symmetric rise of soft palate  CN XI 5/5 SCM and trapezius strength bilaterally  CN XII Midline tongue protrusion, symmetric L/R movements  Psychiatric:        Behavior: Behavior normal.      ED Treatments / Results  Labs (all labs ordered are listed, but only abnormal results are displayed) Labs Reviewed  CBC WITH DIFFERENTIAL/PLATELET - Abnormal; Notable for the following components:      Result Value   Hemoglobin 15.1 (*)    HCT  48.0 (*)    Lymphs Abs 0.6 (*)    All other components within normal limits  COMPREHENSIVE METABOLIC PANEL - Abnormal; Notable for the following components:   Sodium 131 (*)    Chloride 91 (*)    Glucose, Bld 146 (*)    Creatinine, Ser 1.32 (*)    Albumin 3.0 (*)    AST 65 (*)    ALT 96 (*)    Alkaline Phosphatase 613 (*)    GFR calc non Af Amer 37 (*)    GFR calc Af Amer 43 (*)    All other components within normal limits  URINE CULTURE  URINALYSIS, ROUTINE W REFLEX MICROSCOPIC    EKG EKG Interpretation  Date/Time:  Tuesday April 17 2018 08:49:05 EDT Ventricular Rate:  89 PR Interval:    QRS Duration: 94 QT Interval:  364 QTC Calculation: 443 R Axis:   36 Text Interpretation:  Sinus rhythm Borderline short PR interval Borderline repolarization abnormality PR appears shorter than previously Confirmed by Merrily Pew [phone]) on 04/17/2018 8:58:23 AM   Radiology No results found.  Procedures Procedures (including critical care time)  Medications Ordered in ED  Medications  acetaminophen (TYLENOL) tablet 1,000 mg (1,000 mg Oral Given 04/17/18 1028)  sodium chloride 0.9 % bolus 1,000 mL (0 mLs Intravenous Stopped 04/17/18 1417)     Initial Impression / Assessment and Plan / ED Course  I have reviewed the triage vital signs and the nursing notes.  Pertinent labs & imaging results that were available during my care of the patient were reviewed by me and considered in my medical decision making (see chart for details).  Clinical Course as of Apr 16 1920  Tue Apr 17, 2018  0954 Couldn't stand up due to pain in ribs 1230 HH tylenol 2 qam qpm last night none today pain since surgery both 130 pm slept on chair 730 pm x falls    [CG]  1149 Similar to previous  Sodium(!): 131 [CG]  1149 Creatinine(!): 1.32 [CG]  1150 Chronically elevated  Alkaline Phosphatase(!): 613 [CG]    Clinical Course User Index [CG] Kinnie Feil, PA-C      Reviewed recent admission, imaging, UA and cx, PT notes, labs including MRI, CXR, UAs, CBC, CMP.  AKI in hospital resolved after IVFs. Last two UA inconclusive, last urine cx with multiple species. Family offered SNF but opted for [facility] aide.  1020: Orthostatic but I personally stood and ambulated pt in room required minimal assistance. Had some bilateral/lateral ribs which eased off and pt amble to take 10+ in ED. No complaints of CP, SOB, dizziness, hypoxia during ambulation. Given inconclusive UAs, increased pain, will obtain I&O for urine. EKG unchanged.   1218Rosendo Gros CM has ordered PT states will then seek SNF placement. UA negative. IVF.  Pt tolerated PO.  Son aware she will be placed out of ED after PT.  Re-evaluated pt she is oriented to self, place.  No signs of significant confusion/encephalopathy today.  Cooperative in ED.  Appropriate for dc to SNF after PT eval. I see no medical indication for admission.  Final Clinical Impressions(s) / ED Diagnoses   1707: At 1630 I was notified by [facility] CM that all  SNFs in [place] requiring negative COVID before accepting any patients.  Multiple calls to CM and PT. PT evaluated pt recommending SNF.  [facility] prefers [facility], per [employer] may not require this testing.  CM to work on placement to facilities  outside [facility]. Son states if no SNF will accept pt, he will assume care of pt at home to avoid prolonged stay in ED/hospital. Handed off to oncoming EDPA. If SNFs requiring COVID test, consider order test in ED and discharge pt home under son's care. CM will be able to arrange placement from home with PT/OT and HH. Case discussed with [facility] and Dr Sabra Heck.  Final diagnoses:  Rib pain  Elevated serum creatinine  Failure to thrive (0-17)  Elevated LFTs    ED Discharge Orders    None     Arlean Hopping 04/17/18 1226   Mesner, Corene Cornea, MD 04/17/18 1343   Kinnie Feil, PA-C 04/17/18 1734    Kinnie Feil, PA-C 04/17/18 1922    Mesner, Corene Cornea, MD 04/20/18 1550

## 2018-04-17 NOTE — NC FL2 (Signed)
[place] LEVEL OF CARE SCREENING TOOL     IDENTIFICATION  Patient Name: [name] Birthdate: 05/25/1932 Sex: female Admission Date (Current Location): 04/17/2018  [facility] and [place] Number:  Herbalist and Address:  [facility],  [place] [address], [place]      Provider Number: [id number]  Attending Physician Name and Address:  Merrily Pew, MD  Relative Name and Phone Number:  Allison Quarry # [phone]    Current Level of Care: Hospital Recommended Level of Care: [place] Prior Approval Number:    Date Approved/Denied:   PASRR Number: [id number]  Discharge Plan: SNF    Current Diagnoses: Patient Active Problem List   Diagnosis Date Noted  . Acute encephalopathy 04/13/2018  . Mild renal insufficiency 04/13/2018  . LFTs abnormal 04/13/2018  . Hypertensive urgency 04/13/2018  . Protein-calorie malnutrition, severe 03/12/2018  . Renal mass 03/09/2018  . Coronary artery calcification 02/14/2018  . Near syncope 02/02/2018  . Orthostatic hypotension 02/02/2018  . Anemia 02/02/2018  . Suprapubic discomfort 07/06/2017  . Vitamin D deficiency 08/19/2015  . Closed right hip fracture ([place]) 05/30/2015  . Chest pain syndrome 05/30/2015  . Fall at home 05/30/2015  . Closed right femoral fracture ([place]) 05/30/2015  . Displaced fracture of right femoral neck ([place]) 05/30/2015  . Fall   . Cough 07/02/2013  . Other dyspnea and respiratory abnormality 07/02/2013  . History of colon cancer, stage III 07/02/2013  . Chronic cholecystitis with calculus s/p lap chole 11/17/2012 11/19/2012  . Hyponatremia 11/16/2012  . GERD (gastroesophageal reflux disease) 11/16/2012  . Choledocholithiasis with obstruction 11/16/2012  . Calcification of aorta (HCC) 11/16/2012  . Atherosclerosis 11/16/2012  . Hypothyroidism 11/15/2012  . Nonspecific (abnormal) findings on radiological and other examination of  gastrointestinal tract 11/08/2012  . S/P right knee replacement 12/22/2010  . Preop cardiovascular exam 12/08/2010  . Essential hypertension 12/08/2010  . Hyperlipidemia 12/08/2010    Orientation RESPIRATION BLADDER Height & Weight     Self, Time, Place  Normal Continent Weight:   Height:     BEHAVIORAL SYMPTOMS/MOOD NEUROLOGICAL BOWEL NUTRITION STATUS      Continent Diet  AMBULATORY STATUS COMMUNICATION OF NEEDS Skin   Extensive Assist Verbally Normal                       Personal Care Assistance Level of Assistance  Bathing, Dressing Bathing Assistance: Limited assistance   Dressing Assistance: Limited assistance     Functional Limitations Info             SPECIAL CARE FACTORS FREQUENCY  PT (By licensed PT), OT (By licensed OT)     PT Frequency: 5 OT Frequency: 5            Contractures Contractures Info: Not present    Additional Factors Info  Code Status, Allergies Code Status Info: Full Code Allergies Info: Latex, Tape, Sulfa Antibiotics, Lidocaine           Current Medications (04/17/2018):  This is the current hospital active medication list No current facility-administered medications for this encounter.    Current Outpatient Medications  Medication Sig Dispense Refill  . acetaminophen (TYLENOL 8 HOUR ARTHRITIS PAIN) 650 MG CR tablet Take 1,300 mg by mouth 2 (two) times daily.     Marland Kitchen amLODipine (NORVASC) 5 MG tablet Take 1 tablet (5 mg total) by mouth daily. 30 tablet 0  . aspirin EC 81 MG tablet Take 81 mg  by mouth daily.    . Calcium-Vitamin D (CALTRATE 600 PLUS-VIT D PO) Take 1 tablet by mouth 2 (two) times daily.     Marland Kitchen docusate sodium (COLACE) 100 MG capsule Take 300 mg by mouth daily.     Marland Kitchen esomeprazole (NEXIUM) 40 MG capsule Take 40 mg by mouth 2 (two) times daily at 8 am and 10 pm.     . estradiol (ESTRACE) 0.5 MG tablet Take 0.25 mg by mouth every morning.   3  . famotidine (PEPCID) 20 MG tablet Take 20 mg by mouth daily before  breakfast.     . ferrous sulfate 325 (65 FE) MG tablet Take 325 mg by mouth 2 (two) times daily.    Marland Kitchen levothyroxine (SYNTHROID, LEVOTHROID) 200 MCG tablet Take 200 mcg by mouth daily before breakfast.    . medroxyPROGESTERone (PROVERA) 5 MG tablet Take 2.5 mg by mouth every morning.     . metoprolol succinate (TOPROL-XL) 50 MG 24 hr tablet Take 50 mg by mouth every morning.     . Multiple Vitamins-Minerals (CENTRUM SILVER ULTRA WOMENS PO) Take 1 tablet by mouth every evening.     . Multiple Vitamins-Minerals (PRESERVISION AREDS PO) Take 2 capsules by mouth daily.     . nortriptyline (PAMELOR) 25 MG capsule Take 75 mg by mouth daily.     Vladimir Faster Glycol-Propyl Glycol (SYSTANE ULTRA OP) Place 2 drops into both eyes at bedtime.    . vitamin B-12 (CYANOCOBALAMIN) 1000 MCG tablet Take 1,000 mcg by mouth daily.    . vitamin C (ASCORBIC ACID) 500 MG tablet Take 500 mg by mouth daily.     . Vitamin D, Ergocalciferol, (DRISDOL) 50000 units CAPS capsule take ONE CAPSULE BY MOUTH every 7 DAYS (Patient taking differently: Take 50,000 Units by mouth every 30 (thirty) days. 1st day of each month) 30 capsule 3  . vitamin E 400 UNIT capsule Take 800 Units by mouth daily.     Marland Kitchen zolpidem (AMBIEN) 10 MG tablet Take 1 tablet (10 mg total) by mouth at bedtime. 30 tablet 0     Discharge Medications: Please see discharge summary for a list of discharge medications.  Relevant Imaging Results:  Relevant Lab Results:   Additional Information [id number]  Erenest Rasher, RN

## 2018-04-17 NOTE — ED Triage Notes (Signed)
Patient arrived from home via [facility] team reporting that she was just d/c from [place] kidney surgery. PT's son called to say she was "not herself" and in pain.  On arrival, patient is alert, not-cooperative with answering questions stating "Im going to start hitting someone." She was able to tell this RN her name and date of birth.

## 2018-04-17 NOTE — TOC Initial Note (Signed)
Transition of Care [facility]) - Initial/Assessment Note    Patient Details  Name: [name] MRN: [id number] Date of Birth: 1932-11-11  Transition of Care [facility]) CM/SW Contact:    Erenest Rasher, RN Phone Number: 04/17/2018, 6:13 PM  Clinical Narrative:                 Recent discharge from [facility] and dc home to son, [name]. Pt was brought back to ED with complaints of pain and per son "just not herself" On initial contact with son, [name] on 04/15/2018, he requested pt come home with [facility]. Contacted Son, [name] and he now wants her to go to SNF rehab. He wants [facility] or [facility] SNF. Requested permission to start FL 2 and fax out to facilities for SNF placement. Son agreeable. PT evaluation complete and recommendation is for SNF. FL 2 started and contacted [employer]. They are requesting information be sent in the hub and they will review. Provided contact number for [facility] ED TOC CM/CSW.   Referrals sent to facilities and areas requested by pt's son. Waiting for confirmation from facilities. [facility] will review info sent through hub. Will have CSW/CM review on tomorrow, 04/18/2018.    Expected Discharge Plan: Skilled Nursing Facility Barriers to Discharge: SNF Pending bed offer   Patient Goals and CMS Choice Patient states their goals for this hospitalization and ongoing recovery are:: be able to eat and drink CMS Medicare.gov Compare Post Acute Care list provided to:: Patient Represenative (must comment)([name] Middleton-son) Choice offered to / list presented to : Adult Children  Expected Discharge Plan and Services Expected Discharge Plan: [place] In-house Referral: Clinical Social Work Discharge Planning Services: CM Consult   Living arrangements for the past 2 months: [place]                          Prior Living Arrangements/Services Living arrangements for the past 2 months: Single Family Home Lives  with:: Adult Children Patient language and need for interpreter reviewed:: Yes Do you feel safe going back to the place where you live?: No   Son is requesting SNF rehab  Need for Family Participation in Patient Care: Yes (Comment) Care giver support system in place?: Yes (comment)   Criminal Activity/Legal Involvement Pertinent to Current Situation/Hospitalization: No - Comment as needed  Activities of Daily Living      Permission Sought/Granted Permission sought to share information with : Case Manager, PCP, Customer service manager, Family Supports Permission granted to share information with : Yes, Verbal Permission Granted  Share Information with NAME: Allison Quarry   Permission granted to share info w AGENCY: (SNF's)  Permission granted to share info w Relationship: (son)     Emotional Assessment Appearance:: Appears stated age Attitude/Demeanor/Rapport: Unable to Assess Affect (typically observed): Unable to Assess Orientation: : Oriented to Self   Psych Involvement: No (comment)  Admission diagnosis:  AMS Patient Active Problem List   Diagnosis Date Noted  . Acute encephalopathy 04/13/2018  . Mild renal insufficiency 04/13/2018  . LFTs abnormal 04/13/2018  . Hypertensive urgency 04/13/2018  . Protein-calorie malnutrition, severe 03/12/2018  . Renal mass 03/09/2018  . Coronary artery calcification 02/14/2018  . Near syncope 02/02/2018  . Orthostatic hypotension 02/02/2018  . Anemia 02/02/2018  . Suprapubic discomfort 07/06/2017  . Vitamin D deficiency 08/19/2015  . Closed right hip fracture ([place]) 05/30/2015  . Chest pain syndrome 05/30/2015  . Fall at home  05/30/2015  . Closed right femoral fracture ([place]) 05/30/2015  . Displaced fracture of right femoral neck ([place]) 05/30/2015  . Fall   . Cough 07/02/2013  . Other dyspnea and respiratory abnormality 07/02/2013  . History of colon cancer, stage III 07/02/2013  . Chronic cholecystitis with calculus s/p  lap chole 11/17/2012 11/19/2012  . Hyponatremia 11/16/2012  . GERD (gastroesophageal reflux disease) 11/16/2012  . Choledocholithiasis with obstruction 11/16/2012  . Calcification of aorta (HCC) 11/16/2012  . Atherosclerosis 11/16/2012  . Hypothyroidism 11/15/2012  . Nonspecific (abnormal) findings on radiological and other examination of gastrointestinal tract 11/08/2012  . S/P right knee replacement 12/22/2010  . Preop cardiovascular exam 12/08/2010  . Essential hypertension 12/08/2010  . Hyperlipidemia 12/08/2010   PCP:  [name], MD Pharmacy:   [place], [place] - [address] [place] [address] Phone: [phone] Fax: [phone]  [id number] [place], [place] [address] Phone: [phone] Fax: [phone], [place], [place] - [address] [place] [address] Phone: [phone] Fax: [phone]     Social Determinants of Health (SDOH) Interventions    Readmission Risk Interventions Readmission Risk Prevention Plan 04/15/2018  Transportation Screening Complete  HRI or Home Care Consult Complete  Social Work Consult for [place] Planning/Counseling Complete  Palliative Care Screening Not Applicable  Some recent data might be hidden

## 2018-04-18 LAB — URINE CULTURE: Culture: NO GROWTH

## 2018-04-18 MED ORDER — ENSURE MAX PROTEIN PO LIQD
11.0000 [oz_av] | Freq: Every day | ORAL | Status: DC
  Filled 2018-04-18: qty 330

## 2018-04-18 MED ORDER — SODIUM CHLORIDE 0.9 % IV BOLUS: 1000.0000 mL | Freq: Once | INTRAVENOUS | Status: DC

## 2018-04-18 NOTE — ED Notes (Signed)
Pt ambulated with assistance. Pt was unsteady when ambulating. Pt used bathroom and now is back in her room. Pt is resting with both eyes closed.

## 2018-04-18 NOTE — Progress Notes (Addendum)
1:35PM: Patient is set to discharge to [facility] SNF today. Son, [name], aware. Patient will need to be transported via [facility]. Please call report to [phone]. Please fax AVS to [phone].   CSW spoke with patients son, [name], who has accepted bed offer at [facility]. CSW notified facility and is awaiting time that patient is able to be transported.   Kingsley Spittle, [place]  [phone]

## 2018-04-18 NOTE — ED Notes (Signed)
[facility] notified to pick pt up for transfer to [facility] in [place].  [facility] notified of pt's admission, report called. [phone].

## 2018-04-18 NOTE — Discharge Instructions (Addendum)
It would be important for you to follow-up with your primary care doctor.  Continue your regular medication.  Follow-up as scheduled and return if any worsening symptoms.

## 2018-04-18 NOTE — ED Provider Notes (Signed)
85 year old female received a signout from [facility] ending stent placement.  Please see previous notes from [place] and given for full HPI and work-up.  Patient presented to the ER briefly yesterday after hospital discharge where she was initially admitted for confusion encephalopathy.  At discharge, she was offered SNF, but family declined.  At the time and opted for home health.  Yesterday, the patient woke up with chronic rib pain and segment was unable to assist her so she returned to the ER.  CT recommending SNF placement.  She is currently pending case management consult has been to staff.  There is been reported that multiple facilities are currently unwilling to accept patients without a documented negative test for COVID-19.   Physical Exam  BP (!) 151/89 (BP Location: Right Arm)   Pulse (!) 118   Temp 98.1 F (36.7 C) (Oral)   Resp 17   LMP 01/17/1994   SpO2 95%   Physical Exam Vitals signs and nursing note reviewed.  Constitutional:      General: She is not in acute distress.    Appearance: She is well-developed.     Comments: Cachectic, elderly female.  HENT:     Head: Normocephalic and atraumatic.  Neck:     Musculoskeletal: Normal range of motion.  Cardiovascular:     Rate and Rhythm: Tachycardia present.  Musculoskeletal: Normal range of motion.  Neurological:     Mental Status: She is alert.     ED Course/Procedures   Clinical Course as of Apr 17 644  Tue Apr 17, 2018  0954 Couldn't stand up due to pain in ribs 1230 HH tylenol 2 qam qpm last night none today pain since surgery both 130 pm slept on chair 730 pm x falls    [CG]  1149 Similar to previous  Sodium(!): 131 [CG]  1149 Creatinine(!): 1.32 [CG]  1150 Chronically elevated  Alkaline Phosphatase(!): 613 [CG]    Clinical Course User Index [CG] Kinnie Feil, PA-C    Procedures  MDM  85 year old female received a signout from PA [place] pending SNF placement. Please see prior provider  notes for complete workup and MDM.    Patient with tachycardia in the 110s despite having arrived in the ER without tachycardia.  She appears to have a mild AKI will give IV fluid bolus while she is pending case management placement.  Patient care transferred to [place] at the end of my shift. Patient presentation, ED course, and plan of care discussed with review of all pertinent labs and imaging. Please see his/her note for further details regarding further ED course and disposition.  Case management consult has been placed and is pending. Son states if no SNF will accept pt, he will assume care of pt at home to avoid prolonged stay in ED/hospital. Handed off to oncoming EDPA. If SNFs requiring COVID test, consider order test in ED and discharge pt home under son's care.      Joanne Gavel, PA-C 04/18/18 0701    Fatima Blank, MD 04/18/18 [phone]

## 2018-04-18 NOTE — ED Notes (Signed)
Pt woke up to take meds- states " I am dead, how can you give me medicine?" stressed to pt that she is not dead and that she was in the hospital.

## 2018-04-18 NOTE — ED Notes (Signed)
IV has been taken out. IV infiltrated. Notified Santiago Glad, Therapist, sports.

## 2018-04-23 ENCOUNTER — Telehealth: Payer: Self-pay | Admitting: Oncology

## 2018-04-23 NOTE — Telephone Encounter (Signed)
Changed appt for 4/17 to a phone visit . - left message for patient on  vmail that appts change and to not come in. - per 4/2 sch message

## 2018-04-24 ENCOUNTER — Telehealth: Payer: Self-pay

## 2018-04-24 NOTE — Telephone Encounter (Signed)
Received call from patient son [name] stating that the patient has 4/15 lab and CT scheduled and 4/17 phone visit with [name]. He stated that the patient is in a rehab facility and he cannot transport her to [facility] for the appointments. He also stated that the patient is confused and will not be able to participate in a phone MD visit. He would like to know if the appointments have to be rescheduled or if he can have the labs and CT at [facility] because that is closer. Explained that will communicate to MD and call back with plan for appointments.

## 2018-04-24 NOTE — Telephone Encounter (Signed)
-----   Message from Wyatt Portela, MD sent at 04/24/2018  3:54 PM EDT ----- Regarding: RE: appointment concerns Appointments can be pushed for 4 to 6 week.  ----- Message ----- From: Scot Dock, RN Sent: 04/24/2018   3:30 PM EDT To: Wyatt Portela, MD Subject: appointment concerns                           Son [name] stated that patient is in rehab facility and is confused. He is unable to transport the patient to [place] for 4/15 lab and CT abd/pelvis and is unsure about the 4/17 MD phone visit d/t pt confusion in a facility. He is either asking to reschedule the appt's or have the lab and CT done at [facility] which is closer to him. Please advise.

## 2018-04-24 NOTE — Telephone Encounter (Signed)
Contacted son [name] and made aware that he will receive a call from scheduling to reschedule all the appointments.

## 2018-04-27 ENCOUNTER — Telehealth: Payer: Self-pay | Admitting: Oncology

## 2018-04-27 NOTE — Telephone Encounter (Signed)
Reschedule lab and f/u per sch msg. Called patients son, [name]. Confirmed dates and times. Gave central radiology number to reschedule CT

## 2018-04-30 ENCOUNTER — Telehealth: Payer: Self-pay | Admitting: *Deleted

## 2018-04-30 NOTE — Telephone Encounter (Signed)
Patients son called concerned about anticipated discharge of patient to his home this coming Wednesday.  He reports patient is non cooperative with staff at [facility] and is now showing a lot of signs of Paranoia.    Questioned if patient has had any change in her medication and son does not know of any.  He reports that all of her confusion increased after she had her nephrectomy.  He is asking for opinion on whether the surgery is the cause of her new confusion and/or recommendations for how to manage her paranoia and confusion at home.  Any medications or recommendations?

## 2018-04-30 NOTE — Telephone Encounter (Signed)
I don't have any suggestions regarding this. He needs to contract her PCP.

## 2018-05-01 NOTE — Telephone Encounter (Signed)
Message relayed to son.  He stated that he tried to call the PCP already and they haven't called him back.  I encouraged him to continue to pursue them for advice.

## 2018-05-02 ENCOUNTER — Other Ambulatory Visit: Payer: Self-pay

## 2018-05-02 ENCOUNTER — Emergency Department (HOSPITAL_COMMUNITY): Payer: Medicare Other

## 2018-05-02 ENCOUNTER — Other Ambulatory Visit: Payer: Medicare Other

## 2018-05-02 ENCOUNTER — Observation Stay (HOSPITAL_COMMUNITY)
Admission: EM | Admit: 2018-05-02 | Discharge: 2018-05-04 | Disposition: A | Discharge status: Home / Self Care | Payer: Medicare Other | Attending: Emergency Medicine | Admitting: Emergency Medicine

## 2018-05-02 ENCOUNTER — Encounter (HOSPITAL_COMMUNITY): Payer: Self-pay | Admitting: Emergency Medicine

## 2018-05-02 ENCOUNTER — Ambulatory Visit (HOSPITAL_COMMUNITY): Admission: RE | Admit: 2018-05-02 | Payer: Medicare Other | Source: Ambulatory Visit

## 2018-05-02 DIAGNOSIS — Z66 Do not resuscitate: Secondary | ICD-10-CM

## 2018-05-02 DIAGNOSIS — Z7982 Long term (current) use of aspirin: Secondary | ICD-10-CM | POA: Insufficient documentation

## 2018-05-02 DIAGNOSIS — R627 Adult failure to thrive: Principal | ICD-10-CM | POA: Diagnosis present

## 2018-05-02 DIAGNOSIS — C680 Malignant neoplasm of urethra: Secondary | ICD-10-CM | POA: Insufficient documentation

## 2018-05-02 DIAGNOSIS — W19XXXA Unspecified fall, initial encounter: Secondary | ICD-10-CM

## 2018-05-02 DIAGNOSIS — R531 Weakness: Secondary | ICD-10-CM | POA: Diagnosis not present

## 2018-05-02 DIAGNOSIS — Z9181 History of falling: Secondary | ICD-10-CM | POA: Diagnosis not present

## 2018-05-02 DIAGNOSIS — Z515 Encounter for palliative care: Secondary | ICD-10-CM

## 2018-05-02 DIAGNOSIS — Z96641 Presence of right artificial hip joint: Secondary | ICD-10-CM | POA: Insufficient documentation

## 2018-05-02 DIAGNOSIS — R000 Tachycardia, unspecified: Secondary | ICD-10-CM

## 2018-05-02 DIAGNOSIS — Z79899 Other long term (current) drug therapy: Secondary | ICD-10-CM | POA: Diagnosis not present

## 2018-05-02 DIAGNOSIS — Z91040 Latex allergy status: Secondary | ICD-10-CM | POA: Insufficient documentation

## 2018-05-02 DIAGNOSIS — Z7189 Other specified counseling: Secondary | ICD-10-CM

## 2018-05-02 DIAGNOSIS — E860 Dehydration: Secondary | ICD-10-CM

## 2018-05-02 DIAGNOSIS — Z96651 Presence of right artificial knee joint: Secondary | ICD-10-CM | POA: Insufficient documentation

## 2018-05-02 DIAGNOSIS — I10 Essential (primary) hypertension: Secondary | ICD-10-CM | POA: Insufficient documentation

## 2018-05-02 DIAGNOSIS — R6251 Failure to thrive (child): Secondary | ICD-10-CM

## 2018-05-02 DIAGNOSIS — C7910 Secondary malignant neoplasm of unspecified urinary organs: Secondary | ICD-10-CM

## 2018-05-02 DIAGNOSIS — E43 Unspecified severe protein-calorie malnutrition: Secondary | ICD-10-CM | POA: Diagnosis not present

## 2018-05-02 DIAGNOSIS — M25512 Pain in left shoulder: Secondary | ICD-10-CM | POA: Diagnosis present

## 2018-05-02 LAB — DIFFERENTIAL
Abs Immature Granulocytes: 0.06 10*3/uL (ref 0.00–0.07)
Basophils Absolute: 0 10*3/uL (ref 0.0–0.1)
Basophils Relative: 0 %
Eosinophils Absolute: 0.1 10*3/uL (ref 0.0–0.5)
Eosinophils Relative: 1 %
Immature Granulocytes: 1 %
Lymphocytes Relative: 6 %
Lymphs Abs: 0.8 10*3/uL (ref 0.7–4.0)
Monocytes Absolute: 1.1 10*3/uL — ABNORMAL HIGH (ref 0.1–1.0)
Monocytes Relative: 9 %
Neutro Abs: 10.8 10*3/uL — ABNORMAL HIGH (ref 1.7–7.7)
Neutrophils Relative %: 83 %

## 2018-05-02 LAB — CBC
HCT: 40 % (ref 36.0–46.0)
Hemoglobin: 12.7 g/dL (ref 12.0–15.0)
MCH: 31.2 pg (ref 26.0–34.0)
MCHC: 31.8 g/dL (ref 30.0–36.0)
MCV: 98.3 fL (ref 80.0–100.0)
Platelets: 387 10*3/uL (ref 150–400)
RBC: 4.07 MIL/uL (ref 3.87–5.11)
RDW: 14.7 % (ref 11.5–15.5)
WBC: 13 10*3/uL — ABNORMAL HIGH (ref 4.0–10.5)
nRBC: 0 % (ref 0.0–0.2)

## 2018-05-02 LAB — URINALYSIS, ROUTINE W REFLEX MICROSCOPIC
Bacteria, UA: NONE SEEN
Bilirubin Urine: NEGATIVE
Glucose, UA: NEGATIVE mg/dL
Hgb urine dipstick: NEGATIVE
Ketones, ur: 5 mg/dL — AB
Leukocytes,Ua: NEGATIVE
Nitrite: NEGATIVE
Protein, ur: 30 mg/dL — AB
Specific Gravity, Urine: 1.025 (ref 1.005–1.030)
pH: 5 (ref 5.0–8.0)

## 2018-05-02 LAB — BASIC METABOLIC PANEL
Anion gap: 10 (ref 5–15)
BUN: 32 mg/dL — ABNORMAL HIGH (ref 8–23)
CO2: 29 mmol/L (ref 22–32)
Calcium: 10.4 mg/dL — ABNORMAL HIGH (ref 8.9–10.3)
Chloride: 100 mmol/L (ref 98–111)
Creatinine, Ser: 1.19 mg/dL — ABNORMAL HIGH (ref 0.44–1.00)
GFR calc Af Amer: 49 mL/min — ABNORMAL LOW (ref >60)
GFR calc non Af Amer: 42 mL/min — ABNORMAL LOW (ref >60)
Glucose, Bld: 185 mg/dL — ABNORMAL HIGH (ref 70–99)
Potassium: 4.3 mmol/L (ref 3.5–5.1)
Sodium: 139 mmol/L (ref 135–145)

## 2018-05-02 MED ORDER — AMLODIPINE BESYLATE 5 MG PO TABS
10.0000 mg | ORAL_TABLET | Freq: Every morning | ORAL | Status: DC
  Administered 2018-05-03 – 2018-05-04 (×2): 10 mg via ORAL
  Filled 2018-05-02 (×2): qty 2

## 2018-05-02 MED ORDER — DRONABINOL 2.5 MG PO CAPS
2.5000 mg | ORAL_CAPSULE | Freq: Two times a day (BID) | ORAL | Status: DC
  Administered 2018-05-03 – 2018-05-04 (×2): 2.5 mg via ORAL
  Filled 2018-05-02 (×3): qty 1

## 2018-05-02 MED ORDER — ESTRADIOL 0.5 MG PO TABS
0.5000 mg | ORAL_TABLET | Freq: Every morning | ORAL | Status: DC
  Administered 2018-05-03 – 2018-05-04 (×2): 0.5 mg via ORAL
  Filled 2018-05-02 (×5): qty 1

## 2018-05-02 MED ORDER — POLYETHYLENE GLYCOL 3350 17 G PO PACK: 17.0000 g | PACK | Freq: Every day | ORAL | Status: DC | PRN

## 2018-05-02 MED ORDER — ACETAMINOPHEN 325 MG PO TABS: 650.0000 mg | ORAL_TABLET | Freq: Four times a day (QID) | ORAL | Status: DC | PRN

## 2018-05-02 MED ORDER — ASPIRIN EC 81 MG PO TBEC
81.0000 mg | DELAYED_RELEASE_TABLET | Freq: Every day | ORAL | Status: DC
  Administered 2018-05-03 – 2018-05-04 (×2): 81 mg via ORAL
  Filled 2018-05-02 (×3): qty 1

## 2018-05-02 MED ORDER — MEDROXYPROGESTERONE ACETATE 5 MG PO TABS
2.5000 mg | ORAL_TABLET | Freq: Every morning | ORAL | Status: DC
  Administered 2018-05-03 – 2018-05-04 (×2): 2.5 mg via ORAL
  Filled 2018-05-02 (×5): qty 1

## 2018-05-02 MED ORDER — DOCUSATE SODIUM 100 MG PO CAPS
100.0000 mg | ORAL_CAPSULE | Freq: Every morning | ORAL | Status: DC
  Administered 2018-05-03 – 2018-05-04 (×2): 100 mg via ORAL
  Filled 2018-05-02 (×2): qty 1

## 2018-05-02 MED ORDER — NORTRIPTYLINE HCL 25 MG PO CAPS
50.0000 mg | ORAL_CAPSULE | Freq: Every day | ORAL | Status: DC
  Administered 2018-05-02 – 2018-05-03 (×2): 50 mg via ORAL
  Filled 2018-05-02 (×2): qty 2

## 2018-05-02 MED ORDER — TRAMADOL HCL 50 MG PO TABS
50.0000 mg | ORAL_TABLET | Freq: Two times a day (BID) | ORAL | Status: DC | PRN
  Administered 2018-05-02: 50 mg via ORAL
  Filled 2018-05-02: qty 1

## 2018-05-02 MED ORDER — SODIUM CHLORIDE 0.9 % IV SOLN
1000.0000 mL | INTRAVENOUS | Status: DC
  Administered 2018-05-02: 1000 mL via INTRAVENOUS

## 2018-05-02 MED ORDER — LEVOTHYROXINE SODIUM 100 MCG PO TABS
200.0000 ug | ORAL_TABLET | Freq: Every day | ORAL | Status: DC
  Administered 2018-05-03 – 2018-05-04 (×2): 200 ug via ORAL
  Filled 2018-05-02 (×2): qty 2

## 2018-05-02 MED ORDER — SODIUM CHLORIDE 0.9 % IV BOLUS (SEPSIS)
500.0000 mL | Freq: Once | INTRAVENOUS | Status: AC
  Administered 2018-05-02: 20:00:00 500 mL via INTRAVENOUS

## 2018-05-02 MED ORDER — SODIUM CHLORIDE 0.9 % IV SOLN
INTRAVENOUS | Status: AC
  Administered 2018-05-03: 01:00:00 via INTRAVENOUS

## 2018-05-02 MED ORDER — PANTOPRAZOLE SODIUM 40 MG PO TBEC
40.0000 mg | DELAYED_RELEASE_TABLET | Freq: Every day | ORAL | Status: DC
  Administered 2018-05-03 – 2018-05-04 (×2): 40 mg via ORAL
  Filled 2018-05-02 (×2): qty 1

## 2018-05-02 MED ORDER — ENOXAPARIN SODIUM 30 MG/0.3ML ~~LOC~~ SOLN
30.0000 mg | SUBCUTANEOUS | Status: DC
  Administered 2018-05-03 (×2): 30 mg via SUBCUTANEOUS
  Filled 2018-05-02 (×2): qty 0.3

## 2018-05-02 MED ORDER — ACETAMINOPHEN 650 MG RE SUPP: 650.0000 mg | Freq: Four times a day (QID) | RECTAL | Status: DC | PRN

## 2018-05-02 MED ORDER — ONDANSETRON HCL 4 MG/2ML IJ SOLN: 4.0000 mg | Freq: Four times a day (QID) | INTRAMUSCULAR | Status: DC | PRN

## 2018-05-02 MED ORDER — ONDANSETRON HCL 4 MG PO TABS: 4.0000 mg | ORAL_TABLET | Freq: Four times a day (QID) | ORAL | Status: DC | PRN

## 2018-05-02 MED ORDER — METOPROLOL SUCCINATE ER 50 MG PO TB24
50.0000 mg | ORAL_TABLET | Freq: Every morning | ORAL | Status: DC
  Administered 2018-05-03 – 2018-05-04 (×2): 50 mg via ORAL
  Filled 2018-05-02 (×2): qty 1

## 2018-05-02 NOTE — ED Notes (Signed)
Allison Quarry (son and POA) made aware that patient is being admitted to room 331.

## 2018-05-02 NOTE — TOC Initial Note (Signed)
Transition of Care [facility]) - Initial/Assessment Note    Patient Details  Name: [name] MRN: [id number] Date of Birth: December 26, 1932  Transition [facility] Care [facility]) CM/SW Contact:    Alyze Lauf Dimitri Ped, LCSW Phone Number: 05/02/2018, 9:53 PM  Clinical Narrative:   CSW consulted with EDP and Pt representative regarding Pt discharge status.          CSW informed by Pt's son, [name], that he felt incapable of caring for her after he was unable to assist her out of the car at home after being discharged from SNF.            Expected Discharge Plan: [place]     Patient Goals and CMS Choice Patient states their goals for this hospitalization and ongoing recovery are:: to discharge  CMS Medicare.gov Compare Post Acute Care list provided to:: Patient Represenative (must comment) Choice offered to / list presented to : Adult Children  Expected Discharge Plan and Services Expected Discharge Plan: [place] In-house Referral: Clinical Social Work   Post Acute Care Choice: [place] Living arrangements for the past 2 months: [place], [place]                 DME Arranged: N/A DME Agency: NA      Prior Living Arrangements/Services Living arrangements for the past 2 months: [place], [place] Lives with:: Adult Children Patient language and need for interpreter reviewed:: Yes Do you feel safe going back to the place where you live?: No   Son is requesting additional SNF rehab  Need for Family Participation in Patient Care: Yes (Comment) Care giver support system in place?: Yes (comment) Current home services: (Pt currently has bedside commode, wheelchair, and rollator. ) Criminal Activity/Legal Involvement Pertinent to Current Situation/Hospitalization: No - Comment as needed  Activities of Daily Living      Permission Sought/Granted Permission sought to share information with : Case  Manager, Customer service manager, Family Supports Permission granted to share information with : Yes, Verbal Permission Granted  Share Information with NAME: Allison Quarry           Emotional Assessment Appearance:: Appears stated age Attitude/Demeanor/Rapport: Guarded Affect (typically observed): Irritable Orientation: : Oriented to Self, Oriented to Place   Psych Involvement: No (comment)  Admission diagnosis:  Fall Patient Active Problem List   Diagnosis Date Noted  . FTT (failure to thrive) in adult 05/02/2018  . Acute encephalopathy 04/13/2018  . Mild renal insufficiency 04/13/2018  . LFTs abnormal 04/13/2018  . Hypertensive urgency 04/13/2018  . Protein-calorie malnutrition, severe 03/12/2018  . Renal mass 03/09/2018  . Coronary artery calcification 02/14/2018  . Near syncope 02/02/2018  . Orthostatic hypotension 02/02/2018  . Anemia 02/02/2018  . Suprapubic discomfort 07/06/2017  . Vitamin D deficiency 08/19/2015  . Closed right hip fracture ([place]) 05/30/2015  . Chest pain syndrome 05/30/2015  . Fall at home 05/30/2015  . Closed right femoral fracture ([place]) 05/30/2015  . Displaced fracture of right femoral neck ([place]) 05/30/2015  . Fall   . Cough 07/02/2013  . Other dyspnea and respiratory abnormality 07/02/2013  . History of colon cancer, stage III 07/02/2013  . Chronic cholecystitis with calculus s/p lap chole 11/17/2012 11/19/2012  . Hyponatremia 11/16/2012  . GERD (gastroesophageal reflux disease) 11/16/2012  . Choledocholithiasis with obstruction 11/16/2012  . Calcification of aorta (HCC) 11/16/2012  . Atherosclerosis 11/16/2012  . Hypothyroidism 11/15/2012  . Nonspecific (abnormal) findings on radiological and  other examination of gastrointestinal tract 11/08/2012  . S/P right knee replacement 12/22/2010  . Preop cardiovascular exam 12/08/2010  . Essential hypertension 12/08/2010  . Hyperlipidemia 12/08/2010   PCP:  [name],  MD Pharmacy:   [facility], [place] - [address] [place]  [address] Phone: [phone] Fax: [phone]  [id number] [place], [place] [address] Phone: [phone] Fax: [phone], [place], [place] - [address] [place] [address] Phone: [phone] Fax: [phone]     Social Determinants of Health (SDOH) Interventions    Readmission Risk Interventions Readmission Risk Prevention Plan 04/15/2018  Transportation Screening Complete  HRI or Home Care Consult Complete  Social Work Consult for [place] Planning/Counseling Complete  Palliative Care Screening Not Applicable  Some recent data might be hidden

## 2018-05-02 NOTE — Progress Notes (Addendum)
CSW met with Pt at bedside to assess for any discharge needs. Pt was hard to understand. Pt granted CSW permission to contact her son to continue conversation.   Pt's son, [name] reports that Mother has been at [facility] center since 4/1-4/15. [name] goes into detail and states that Pt fell last night at the SNF. [name] expresses confusion about how his mother needed 2 person assist to transfer from wheelchair to car after being discharged from PT at the SNF.   [name] reports that he did not receive any notice although they set a date for her  discharge because she had met her goals for PT.   Pt's son [name] reports that because he has a disability himself, he is unable to care for her because he can not lift her. [name] goes into detail and expresses that he expected her to be able to do more independently since being discharged from SNF.    Pt states that home health care was scheduled to start services on tomorrow 05/03/2018 with [phone] ext [phone].   [name] concluded conversation by expressing that he would be unable to care for her in her current condition considering his disability as well.   CSW updated EDP.   CSW will CSW will continue to follow for D/C needs.  [facility]  Clinical Social Worker  Ph: [phone]

## 2018-05-02 NOTE — H&P (Signed)
History and Physical    [name] QMG:[id number] DOB: 02-Mar-1932 DOA: 05/02/2018  PCP: [name], MD   [facility] >> Home >> ED  I have personally briefly reviewed patient's old medical records in [place]  Chief Complaint: Generalized weakness  HPI: [name] is a 85 y.o. female with medical history significant for hypertension,, carcinoma with metastasis to the liver s/p left nephrectomy, who was brought to the ED from home with reports of generalized weakness.  Recent hospitalization patient was discharged to [facility].  Yesterday patient fell-unsure about the circumstances of the fall, he was sitting on the side of the bed when she fell. She was discharged home today.  Per notes she was discharged on the 15th day of the 21-day stay.  Patient reports left shoulder pain.  On, arrival home today, she required 2 person assist to get her from the car.  She had some who patient lives with is on disability.  Recent hospitalization 3/27 to 3/30-for acute encephalopathy thought likely metabolic- probably dehydration.  Infectious etiology was ruled out.  She was hydrated and discharged to SNF.  I called the [facility] ED-they have no sick : "Covid" cases.  Patient's mostly ambulated with assistance, she was weak but made some progress, also occasionally get up on her own to use the bathroom or transfer by herself to a wheelchair. She was otherwise doing okay until she fell last night.  Patient's appetite was very poor also and she was started on Marinol facility.  She had a history of falls.  ED Course: Blood pressure systolic 326Z to 124P, tachycardic- to 116, WBC 13, creatinine 1.1 otherwise unremarkable BMP.  UA-negative for nitrite leukocytes and bacteria.  Imaging included-left shoulder x-ray, portable chest x-ray, CT head without contrast, pelvic x-ray-all negative for acute abnormality or fractures.  EKG unchanged from prior.  1 mL bolus  given in ED.  Social worker consulted in the ED, plans to place patient in another nursing home, as per request she does not want to go back to [facility] Hospitalist to admit for failure to thrive, tachycardia.  Review of Systems: As per HPI all other systems reviewed and negative.  Past Medical History:  Diagnosis Date   Abdominal pain    Anemia    Calcification of aorta ([place]) 11/16/2012   Coronary artery calcification 02/14/2018   cardiologist-- dr t. Oval Linsey   Depression    Diabetes mellitus type 2, diet-controlled ([place])    pt denies   DJD (degenerative joint disease)    DM II (diabetes mellitus, type II), controlled 11/16/2012   Dysuria    Epistaxis    Family history of adverse reaction to anesthesia    sister--- ponv   Fibromyalgia    GERD (gastroesophageal reflux disease)    Goiter    Gross hematuria    Headache(784.0)    tension headache daily per pt    Heart murmur    Hiatal hernia    History of vertebral compression fracture    Hypertension    Hypothyroidism    endocrinologist-- dr balan   Left adrenal mass [facility])    Left renal mass    Left thyroid nodule    hx biopsy 2013, benign   Macular degeneration    both  left > right   Osteoarthritis    Osteoporosis    Peripheral vascular disease ([place])    varicose veins in left arm    Personal history of colon  cancer, stage III oncologist-- dr Alvy Bimler (note in epic no recurrence)   2003---  s/p  right hemicolectomy 10-03-2001  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    CHOLECYSTECTOMY N/A 11/17/2012   Procedure: LAPAROSCOPIC CHOLECYSTECTOMY;  Surgeon: Leighton Ruff, MD;  Location: [facility] ORS;  Service: General;  Laterality: N/A;   COLONOSCOPY W/ BIOPSIES  09/25/2002, 09/18/2001   ERCP N/A 11/14/2012   Procedure: ENDOSCOPIC RETROGRADE CHOLANGIOPANCREATOGRAPHY (ERCP);  Surgeon: Jeryl Columbia, MD;  Location: [facility];  Service: Endoscopy;  Laterality: N/A;   ESOPHAGOGASTRODUODENOSCOPY ENDOSCOPY     KYPHOPLASTY  01/2011   LAPAROSCOPIC RIGHT HEMI COLECTOMY  10-03-2001    dr Hassell Done @[facility]    LUMBAR SPINE SURGERY  2000   porta catheter placement     REMOVAL 2005   ROBOT ASSISTED LAPAROSCOPIC NEPHRECTOMY Left 03/09/2018   Procedure: XI ROBOTIC ASSISTED LAPAROSCOPIC NEPHRECTOMY;  Surgeon: Cleon Gustin, MD;  Location: [facility];  Service: Urology;  Laterality: Left;  3 HRS   TEMPOROMANDIBULAR JOINT SURGERY Right 1990s   TONSILLECTOMY  child   TOTAL HIP ARTHROPLASTY Right 05/30/2015   Procedure: TOTAL HIP ARTHROPLASTY ANTERIOR APPROACH;  Surgeon: Rod Can, MD;  Location: [place];  Service: Orthopedics;  Laterality: Right;   TOTAL KNEE ARTHROPLASTY  12/20/2010   Procedure: TOTAL KNEE ARTHROPLASTY;  Surgeon: Mauri Pole;  Location: [facility];  Service: Orthopedics;  Laterality: Right;   TOTAL KNEE ARTHROPLASTY Right 12-20-2010  dr Alvan Dame @[facility]    UPPER GASTROINTESTINAL ENDOSCOPY  09/18/2001     reports that she has never smoked. She has never used smokeless tobacco. She reports that she does not drink alcohol or use drugs.  Allergies  Allergen Reactions   Tape Rash    Blisters   Latex Rash and Other (See Comments)    blister   Lidocaine Nausea And Vomiting   Sulfa Antibiotics Nausea And Vomiting    Family History  Problem Relation Age of Onset   Heart disease Mother        Enlarged heart   Arrhythmia Mother    Diabetes Mother    Heart attack Mother    Heart disease Brother        MI at unknown age   Anesthesia problems Brother    Heart  disease Father    Heart attack Father    Cancer Son        non-hodgkins lymphoma   Stroke Sister    Heart attack Other        MI at age 17   Heart disease Brother        CABG   AAA (abdominal aortic aneurysm) Brother    CAD Brother     Prior to Admission medications   Medication Sig Start Date End Date Taking? Authorizing Provider  acetaminophen (TYLENOL) 325 MG tablet Take 650 mg by mouth 2 (two) times daily.   Yes [provider]  amLODipine (NORVASC) 5 MG tablet Take 1 tablet (5 mg total) by mouth daily. Patient taking differently: Take 10  mg by mouth every morning.  04/16/18  Yes Hosie Poisson, MD  aspirin EC 81 MG tablet Take 81 mg by mouth daily.   Yes [provider]  Cholecalciferol (VITAMIN D) 50 MCG (2000 UT) CAPS Take 1 capsule by mouth every morning.   Yes [provider]  docusate sodium (COLACE) 100 MG capsule Take 100 mg by mouth every morning.    Yes [provider]  dronabinol (MARINOL) 2.5 MG capsule Take 2.5 mg by mouth 2 (two) times daily before a meal.   Yes [provider]  estradiol (ESTRACE) 0.5 MG tablet Take 0.5 mg by mouth every morning.  05/16/16  Yes [provider]  ferrous sulfate 325 (65 FE) MG tablet Take 325 mg by mouth 2 (two) times daily.   Yes [provider]  levothyroxine (SYNTHROID, LEVOTHROID) 200 MCG tablet Take 200 mcg by mouth daily before breakfast.   Yes [provider]  medroxyPROGESTERone (PROVERA) 5 MG tablet Take 2.5 mg by mouth every morning.    Yes [provider]  metoprolol succinate (TOPROL-XL) 50 MG 24 hr tablet Take 50 mg by mouth every morning.    Yes [provider]  Multiple Vitamins-Minerals (CENTRUM SILVER ULTRA WOMENS PO) Take 1 tablet by mouth every evening.    Yes [provider]  Multiple Vitamins-Minerals (PRESERVISION AREDS PO) Take 2 capsules by mouth daily.    Yes [provider]  nortriptyline (PAMELOR) 50  MG capsule Take 50 mg by mouth at bedtime.    Yes [provider]  omeprazole (PRILOSEC) 20 MG capsule Take 20 mg by mouth daily.   Yes [provider]  Polyethyl Glycol-Propyl Glycol (SYSTANE ULTRA OP) Place 2 drops into both eyes at bedtime.   Yes [provider]  traMADol (ULTRAM) 50 MG tablet Take 50 mg by mouth every 4 (four) hours as needed for moderate pain or severe pain.   Yes [provider]  vitamin B-12 (CYANOCOBALAMIN) 1000 MCG tablet Take 1,000 mcg by mouth daily.   Yes [provider]  vitamin C (ASCORBIC ACID) 500 MG tablet Take 500 mg by mouth daily.    Yes [provider]  Vitamin D, Ergocalciferol, (DRISDOL) 50000 units CAPS capsule take ONE CAPSULE BY MOUTH every 7 DAYS Patient taking differently: Take 50,000 Units by mouth every Monday.  08/19/15  Yes Kem Boroughs, FNP    Physical Exam: Vitals:   05/02/18 1815 05/02/18 1830 05/02/18 2000 05/02/18 2030  BP:  (!) 152/89 (!) 162/90 (!) 164/94  Pulse:   (!) 111 (!) 116  Resp: (!) 37 (!) 25 (!) 24 (!) 25  Temp:      TempSrc:      SpO2:   96% 100%    Constitutional: NAD, calm, comfortable Vitals:   05/02/18 1815 05/02/18 1830 05/02/18 2000 05/02/18 2030  BP:  (!) 152/89 (!) 162/90 (!) 164/94  Pulse:   (!) 111 (!) 116  Resp: (!) 37 (!) 25 (!) 24 (!) 25  Temp:      TempSrc:      SpO2:   96% 100%   Eyes: PERRL, lids and conjunctivae normal ENMT: Mucous membranes are dry Posterior pharynx clear of any exudate or lesions. Neck: normal, supple, no masses, no thyromegaly Respiratory:  Normal respiratory effort. No accessory muscle use.  Cardiovascular: TAchycardia, but Regular rate and rhythm, No extremity edema. 2+ pedal pulses. No carotid bruits.  Abdomen: no tenderness, no masses palpated. No hepatosplenomegaly. Bowel sounds positive.  Musculoskeletal:  no clubbing / cyanosis. No joint deformity upper and lower extremities. Good ROM, no contractures. Normal muscle  tone.  Skin: no rashes, lesions, ulcers. No induration Neurologic: CN 2-12 grossly intact. Sensation intact, DTR normal. Strength 4+ /5 in all 4.  Psychiatric: Normal judgment and insight. Alert and oriented x 3. Normal mood.   Labs on Admission: I have personally reviewed following labs and imaging studies  CBC: Recent Labs  Lab 05/02/18 1820  WBC 13.0*  HGB 12.7  HCT 40.0  MCV 98.3  PLT 951   Basic Metabolic Panel: Recent Labs  Lab 05/02/18 1820  NA 139  K 4.3  CL 100  CO2 29  GLUCOSE 185*  BUN 32*  CREATININE 1.19*  CALCIUM 10.4*   Urine analysis:    Component Value Date/Time   COLORURINE AMBER (A) 05/02/2018 1827   APPEARANCEUR CLEAR 05/02/2018 1827   LABSPEC 1.025 05/02/2018 1827   PHURINE 5.0 05/02/2018 1827   GLUCOSEU NEGATIVE 05/02/2018 1827   HGBUR NEGATIVE 05/02/2018 1827   BILIRUBINUR NEGATIVE 05/02/2018 1827   BILIRUBINUR neg 07/29/2013 0952   KETONESUR 5 (A) 05/02/2018 1827   PROTEINUR 30 (A) 05/02/2018 1827   UROBILINOGEN negative 07/29/2013 0952   UROBILINOGEN 0.2 12/24/2010 0729   NITRITE NEGATIVE 05/02/2018 1827   LEUKOCYTESUR NEGATIVE 05/02/2018 1827    Radiological Exams on Admission: Dg Chest 1 View  Result Date: 05/02/2018 CLINICAL DATA:  85 year old female with a fall EXAM: CHEST  1 VIEW COMPARISON:  04/13/2018 FINDINGS: Cardiomediastinal silhouette unchanged in size and contour. No evidence of central vascular congestion or interlobular septal thickening. Diffuse coarsening of interstitial markings similar to the prior. No pneumothorax or pleural effusion. No confluent airspace disease. Pleuroparenchymal thickening at the apices. No acute displaced fracture. Degenerative changes of the spine and the shoulders. IMPRESSION: Negative for acute cardiopulmonary disease Electronically Signed   By: Corrie Mckusick D.O.   On: 05/02/2018 18:59   Dg Pelvis 1-2 Views  Result Date: 05/02/2018 CLINICAL DATA:  85 year old female with fall EXAM: PELVIS -  1-2 VIEW COMPARISON:  02/27/2010 FINDINGS: Bony pelvic ring is intact. No acute fracture identified. Surgical changes of right hip arthroplasty. Components are aligned. Left hip projects normally over the acetabula. No periprosthetic fracture with unremarkable proximal right femur. Calcified mass of the abdomen, better demonstrated on CT from February. IMPRESSION: No acute bony abnormality. Right hip arthroplasty. Electronically Signed   By: Corrie Mckusick D.O.   On: 05/02/2018 19:58   Ct Head Wo Contrast  Result Date: 05/02/2018 CLINICAL DATA:  85 year old female with a fall EXAM: CT HEAD WITHOUT CONTRAST TECHNIQUE: Contiguous axial images were obtained from the base of the skull through the vertex without intravenous contrast. COMPARISON:  MR 04/11/2018, no prior head CT FINDINGS: Brain: No acute intracranial hemorrhage. No midline shift or mass effect. Gray-white differentiation maintained. Unremarkable appearance of the ventricular system. Brain volume loss. Patchy hypodensity in the periventricular white matter. Vascular: Vascular calcifications Skull: No acute fracture.  No aggressive bone lesion identified. Sinuses/Orbits: Unremarkable appearance of the orbits. Mastoid air cells clear. No middle ear effusion. No significant sinus disease. Other: None IMPRESSION: Negative for acute intracranial abnormality. Evidence of chronic microvascular ischemic disease. Electronically Signed   By: Corrie Mckusick D.O.   On: 05/02/2018 19:01   Dg Shoulder Left  Result Date: 05/02/2018 CLINICAL DATA:  85 year old female with a fall EXAM: LEFT SHOULDER - 2+ VIEW COMPARISON:  None. FINDINGS: Osteopenia. Glenohumeral joint appears congruent. No acute displaced fracture. Unremarkable clavicle. Degenerative changes  of the acromioclavicular joint. Significant subacromial spurring. Rounded calcifications overlying the humerus likely representing loose bodies. IMPRESSION: Negative for acute bony abnormality. Degenerative  changes Electronically Signed   By: Corrie Mckusick D.O.   On: 05/02/2018 19:00    EKG: Independently reviewed.  Sinus tachycardia, EKG unchanged from prior.  Assessment/Plan Active Problems:   FTT (failure to thrive) in adult   Failure to thrive-frequent falls, generalized weakness, requiring assistance with ambulation, poor p.o. intake.  Appears dehydrated with dry mucous membranes.  Debility likely related to frailty and advanced age, possibly progression of her stage 4, urothelial  carcinoma with hepatic metastasis.  At this time symptoms, imaging finding and UA do not suggest infectious etiology. -Hydrate, 548ml bolus given in Ed, cont N/s 100cc/hr x 15 hrs -Social work consult for placement - PT eval  High grade Urothelial carcinoma- of left renal pelvis extending into perinephric fat and adrenal gland diagnosed in February 2020 , with potential hepatic metastasis. S/p left robotic laparoscopic radical nephrectomy and adrenalectomy completed on March 10, 2018. Follows with [name]. per 03/21/18 note- Plan was to observe and return after imaging in 4 weeks to evaluate for Chemo Vs immunotherapy, palliative to improve her quality of life.  -Continue home Marinol, estradiol. -Will order Palliative care consult  Tachycardia-rate is 116, likely secondary to pain and dehydration. UA and chest xray clear. Mild leukocytosis- 13, with increased absolute neutrophil count. - Hydrate - PRN Tramadol - CBC a.m  Recurrent falls-pelvic, left shoulder, head CT chest x-ray negative for acute abnormality.  No appreciable focal deficits on exam. -Pain control with tramadol PRN  HTN-pressure systolic 436G to 677C -Continue home Norvasc, metoprolol   DVT prophylaxis:  Lovenox Code Status: Full Family Communication: None at bedside Disposition Plan: 1-2 days Consults called: None Admission status: Obs, tele   Bethena Roys MD [facility]  05/02/2018, 9:27 PM

## 2018-05-02 NOTE — ED Provider Notes (Addendum)
[facility] EMERGENCY DEPARTMENT Provider Note   CSN: [id number] Arrival date & time: 05/02/18  1747    History   Chief Complaint Chief Complaint  Patient presents with  . Fall    HPI [name] is a 85 y.o. female.     HPI Patient presents the emergency room for evaluation after a fall last evening at the rehab facility.  Patient was at the [facility].  Patient states she was discharged today.  It is unclear what exactly happened but patient states her son called EMS to have her evaluated in the emergency department.  Patient states she was sitting on the edge of the bed and is not sure if she hit her head.  She complains of pain in the left shoulder but otherwise denies any significant headache or chest pain.  No fevers or chills.  No chest pain or shortness of breath. Past Medical History:  Diagnosis Date  . Abdominal pain   . Anemia   . Calcification of aorta (HCC) 11/16/2012  . Coronary artery calcification 02/14/2018   cardiologist-- dr t. Oval Linsey  . Depression   . Diabetes mellitus type 2, diet-controlled ([place])    pt denies  . DJD (degenerative joint disease)   . DM II (diabetes mellitus, type II), controlled 11/16/2012  . Dysuria   . Epistaxis   . Family history of adverse reaction to anesthesia    sister--- ponv  . Fibromyalgia   . GERD (gastroesophageal reflux disease)   . Goiter   . Gross hematuria   . Headache(784.0)    tension headache daily per pt   . Heart murmur   . Hiatal hernia   . History of vertebral compression fracture   . Hypertension   . Hypothyroidism    endocrinologist-- dr Chalmers Cater  . Left adrenal mass ([place])   . Left renal mass   . Left thyroid nodule    hx biopsy 2013, benign  . Macular degeneration    both  left > right  . Osteoarthritis   . Osteoporosis   . Peripheral vascular disease ([place])    varicose veins in left arm   . 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 PONV (postoperative nausea and vomiting)   . Renal cell cancer, left [facility])    oncologist-- dr Alen Blew  . TMJ syndrome    right   . UTI (urinary tract infection) 02/28/2018  . Wears glasses     Patient Active Problem List   Diagnosis Date Noted  . Acute encephalopathy 04/13/2018  . Mild renal insufficiency 04/13/2018  . LFTs abnormal 04/13/2018  . Hypertensive urgency 04/13/2018  . Protein-calorie malnutrition, severe 03/12/2018  . Renal mass 03/09/2018  . Coronary artery calcification 02/14/2018  . Near syncope 02/02/2018  . Orthostatic hypotension 02/02/2018  . Anemia 02/02/2018  . Suprapubic discomfort 07/06/2017  . Vitamin D deficiency 08/19/2015  . Closed right hip fracture ([place]) 05/30/2015  . Chest pain syndrome 05/30/2015  . Fall at home 05/30/2015  . Closed right femoral fracture ([place]) 05/30/2015  . Displaced fracture of right femoral neck ([place]) 05/30/2015  . Fall   . Cough 07/02/2013  . Other dyspnea and respiratory abnormality 07/02/2013  . History of colon cancer, stage III 07/02/2013  . Chronic cholecystitis with  calculus s/p lap chole 11/17/2012 11/19/2012  . Hyponatremia 11/16/2012  . GERD (gastroesophageal reflux disease) 11/16/2012  . Choledocholithiasis with obstruction 11/16/2012  . Calcification of aorta (HCC) 11/16/2012  . Atherosclerosis 11/16/2012  . Hypothyroidism 11/15/2012  . Nonspecific (abnormal) findings on radiological and other examination of gastrointestinal tract 11/08/2012  . S/P right knee replacement 12/22/2010  . Preop cardiovascular exam 12/08/2010  . Essential hypertension 12/08/2010  . Hyperlipidemia 12/08/2010    Past Surgical History:  Procedure Laterality Date  . APPENDECTOMY  age 85  . CATARACT EXTRACTION W/ INTRAOCULAR LENS  IMPLANT, BILATERAL  2004  approx.  .  CHOLECYSTECTOMY N/A 11/17/2012   Procedure: LAPAROSCOPIC CHOLECYSTECTOMY;  Surgeon: Leighton Ruff, MD;  Location: [facility];  Service: General;  Laterality: N/A;  . COLONOSCOPY W/ BIOPSIES  09/25/2002, 09/18/2001  . ERCP N/A 11/14/2012   Procedure: ENDOSCOPIC RETROGRADE CHOLANGIOPANCREATOGRAPHY (ERCP);  Surgeon: Jeryl Columbia, MD;  Location: [facility];  Service: Endoscopy;  Laterality: N/A;  . ESOPHAGOGASTRODUODENOSCOPY ENDOSCOPY    . KYPHOPLASTY  01/2011  . LAPAROSCOPIC RIGHT HEMI COLECTOMY  10-03-2001    dr Hassell Done @[facility]   . [place]  2000  . porta catheter placement     REMOVAL 2005  . ROBOT ASSISTED LAPAROSCOPIC NEPHRECTOMY Left 03/09/2018   Procedure: XI ROBOTIC ASSISTED LAPAROSCOPIC NEPHRECTOMY;  Surgeon: Cleon Gustin, MD;  Location: [facility];  Service: Urology;  Laterality: Left;  3 HRS  . TEMPOROMANDIBULAR JOINT SURGERY Right 1990s  . TONSILLECTOMY  child  . TOTAL HIP ARTHROPLASTY Right 05/30/2015   Procedure: TOTAL HIP ARTHROPLASTY ANTERIOR APPROACH;  Surgeon: Rod Can, MD;  Location: [place];  Service: Orthopedics;  Laterality: Right;  . TOTAL KNEE ARTHROPLASTY  12/20/2010   Procedure: TOTAL KNEE ARTHROPLASTY;  Surgeon: Mauri Pole;  Location: [facility];  Service: Orthopedics;  Laterality: Right;  . TOTAL KNEE ARTHROPLASTY Right 12-20-2010  dr Alvan Dame @[facility]   . UPPER GASTROINTESTINAL ENDOSCOPY  09/18/2001     OB History    Gravida  1   Para  1   Term  1   Preterm  0   AB  0   Living  1     SAB  0   TAB  0   Ectopic  0   Multiple  0   Live Births  1            Home Medications    Prior to Admission medications   Medication Sig Start Date End Date Taking? Authorizing Provider  acetaminophen (TYLENOL) 325 MG tablet Take 650 mg by mouth 2 (two) times daily.   Yes [provider]  amLODipine (NORVASC) 5 MG tablet Take 1 tablet (5 mg total) by mouth daily. Patient taking differently: Take 10 mg by mouth every morning.  04/16/18  Yes Hosie Poisson, MD  aspirin EC 81 MG tablet Take 81 mg by mouth daily.   Yes [provider]  Cholecalciferol (VITAMIN D) 50 MCG (2000 UT) CAPS Take 1 capsule by mouth every morning.   Yes [provider]  docusate sodium (COLACE) 100 MG capsule Take 100 mg by mouth every morning.    Yes [provider]  dronabinol (MARINOL) 2.5 MG capsule Take 2.5 mg by mouth 2 (two) times daily before a meal.   Yes [provider]  estradiol (ESTRACE) 0.5 MG tablet Take 0.5 mg by mouth every morning.  05/16/16  Yes [provider]  ferrous sulfate 325 (65 FE) MG tablet Take 325 mg by mouth  2 (two) times daily.   Yes [provider]  levothyroxine (SYNTHROID, LEVOTHROID) 200 MCG tablet Take 200 mcg by mouth daily before breakfast.   Yes [provider]  medroxyPROGESTERone (PROVERA) 5 MG tablet Take 2.5 mg by mouth every morning.    Yes [provider]  metoprolol succinate (TOPROL-XL) 50 MG 24 hr tablet Take 50 mg by mouth every morning.    Yes [provider]  Multiple Vitamins-Minerals (CENTRUM SILVER ULTRA WOMENS PO) Take 1 tablet by mouth every evening.    Yes [provider]  Multiple Vitamins-Minerals (PRESERVISION AREDS PO) Take 2 capsules by mouth daily.    Yes [provider]  nortriptyline (PAMELOR) 50 MG capsule Take 50 mg by mouth at bedtime.    Yes [provider]  omeprazole (PRILOSEC) 20 MG capsule Take 20 mg by mouth daily.   Yes [provider]  Polyethyl Glycol-Propyl Glycol (SYSTANE ULTRA OP) Place 2 drops into both eyes at bedtime.   Yes [provider]  traMADol (ULTRAM) 50 MG tablet Take 50 mg by mouth every 4 (four) hours as needed for moderate pain or severe pain.   Yes [provider]  vitamin B-12 (CYANOCOBALAMIN) 1000 MCG tablet Take 1,000 mcg by mouth daily.   Yes [provider]  vitamin C (ASCORBIC ACID) 500 MG tablet Take 500 mg by mouth daily.    Yes  [provider]  Vitamin D, Ergocalciferol, (DRISDOL) 50000 units CAPS capsule take ONE CAPSULE BY MOUTH every 7 DAYS Patient taking differently: Take 50,000 Units by mouth every Monday.  08/19/15  Yes Kem Boroughs, FNP    Family History Family History  Problem Relation Age of Onset  . Heart disease Mother        Enlarged heart  . Arrhythmia Mother   . Diabetes Mother   . Heart attack Mother   . Heart disease Brother        MI at unknown age  . Anesthesia problems Brother   . Heart disease Father   . Heart attack Father   . Cancer Son        non-hodgkins lymphoma  . Stroke Sister   . Heart attack Other        MI at age 70  . Heart disease Brother        CABG  . AAA (abdominal aortic aneurysm) Brother   . CAD Brother     Social History Social History   Tobacco Use  . Smoking status: Never Smoker  . Smokeless tobacco: Never Used  Substance Use Topics  . Alcohol use: No  . Drug use: No     Allergies   Tape; Latex; Lidocaine; and Sulfa antibiotics   Review of Systems Review of Systems  All other systems reviewed and are negative.    Physical Exam Updated Vital Signs BP (!) 164/94   Pulse (!) 111   Temp 97.9 F (36.6 C) (Oral)   Resp (!) 25   LMP 01/17/1994   SpO2 100%   Physical Exam Vitals signs and nursing note reviewed.  Constitutional:      General: She is not in acute distress.    Appearance: She is well-developed.     Comments: Elderly, frail  HENT:     Head: Normocephalic and atraumatic.     Comments: Mucous membranes dry    Right Ear: External ear normal.     Left Ear: External ear normal.  Eyes:     General: No scleral  icterus.       Right eye: No discharge.        Left eye: No discharge.     Conjunctiva/sclera: Conjunctivae normal.  Neck:     Musculoskeletal: Neck supple.     Trachea: No tracheal deviation.  Cardiovascular:     Rate and Rhythm: Normal rate and regular rhythm.  Pulmonary:     Effort: Pulmonary effort is  normal. No respiratory distress.     Breath sounds: Normal breath sounds. No stridor. No wheezing or rales.  Abdominal:     General: Bowel sounds are normal. There is no distension.     Palpations: Abdomen is soft.     Tenderness: There is no abdominal tenderness. There is no guarding or rebound.  Musculoskeletal:     Right shoulder: She exhibits no tenderness, no bony tenderness and no swelling.     Left shoulder: She exhibits tenderness. She exhibits no bony tenderness and no swelling.     Right wrist: She exhibits no tenderness, no bony tenderness and no swelling.     Left wrist: She exhibits no tenderness, no bony tenderness and no swelling.     Right hip: She exhibits normal range of motion, no tenderness, no bony tenderness and no swelling.     Left hip: She exhibits normal range of motion, no tenderness and no bony tenderness.     Right ankle: She exhibits no swelling. No tenderness.     Left ankle: She exhibits no swelling. No tenderness.     Cervical back: She exhibits no tenderness, no bony tenderness and no swelling.     Thoracic back: She exhibits no tenderness, no bony tenderness and no swelling.     Lumbar back: She exhibits no tenderness, no bony tenderness and no swelling.  Skin:    General: Skin is warm and dry.     Findings: No rash.  Neurological:     Mental Status: She is alert.     Cranial Nerves: No cranial nerve deficit (no facial droop, extraocular movements intact, no slurred speech).     Sensory: No sensory deficit.     Motor: Weakness present. No abnormal muscle tone or seizure activity.     Coordination: Coordination normal.     Comments: Listless, generalized weakness, patient is able to squeeze my hands equally, move both lower extremities, speech is slightly garbled intermittently       ED Treatments / Results  Labs (all labs ordered are listed, but only abnormal results are displayed) Labs Reviewed  CBC - Abnormal; Notable for the following  components:      Result Value   WBC 13.0 (*)    All other components within normal limits  BASIC METABOLIC PANEL - Abnormal; Notable for the following components:   Glucose, Bld 185 (*)    BUN 32 (*)    Creatinine, Ser 1.19 (*)    Calcium 10.4 (*)    GFR calc non Af Amer 42 (*)    GFR calc Af Amer 49 (*)    All other components within normal limits  URINALYSIS, ROUTINE W REFLEX MICROSCOPIC - Abnormal; Notable for the following components:   Color, Urine AMBER (*)    Ketones, ur 5 (*)    Protein, ur 30 (*)    All other components within normal limits    EKG EKG Interpretation  Date/Time:  Wednesday May 02 2018 17:53:31 EDT Ventricular Rate:  111 PR Interval:    QRS Duration: 94 QT Interval:  333 QTC Calculation: 449 R Axis:   5 Text Interpretation:  Sinus tachycardia Multiple premature complexes, vent & supraven Low voltage, precordial leads Borderline T wave abnormalities Baseline wander in lead(s) I aVR Since last tracing rate faster Confirmed by Dorie Rank [phone]) on 05/02/2018 6:20:29 PM   Radiology Dg Chest 1 View  Result Date: 05/02/2018 CLINICAL DATA:  85 year old female with a fall EXAM: CHEST  1 VIEW COMPARISON:  04/13/2018 FINDINGS: Cardiomediastinal silhouette unchanged in size and contour. No evidence of central vascular congestion or interlobular septal thickening. Diffuse coarsening of interstitial markings similar to the prior. No pneumothorax or pleural effusion. No confluent airspace disease. Pleuroparenchymal thickening at the apices. No acute displaced fracture. Degenerative changes of the spine and the shoulders. IMPRESSION: Negative for acute cardiopulmonary disease Electronically Signed   By: Corrie Mckusick D.O.   On: 05/02/2018 18:59   Dg Pelvis 1-2 Views  Result Date: 05/02/2018 CLINICAL DATA:  85 year old female with fall EXAM: PELVIS - 1-2 VIEW COMPARISON:  02/27/2010 FINDINGS: Bony pelvic ring is intact. No acute fracture identified. Surgical changes  of right hip arthroplasty. Components are aligned. Left hip projects normally over the acetabula. No periprosthetic fracture with unremarkable proximal right femur. Calcified mass of the abdomen, better demonstrated on CT from February. IMPRESSION: No acute bony abnormality. Right hip arthroplasty. Electronically Signed   By: Corrie Mckusick D.O.   On: 05/02/2018 19:58   Ct Head Wo Contrast  Result Date: 05/02/2018 CLINICAL DATA:  85 year old female with a fall EXAM: CT HEAD WITHOUT CONTRAST TECHNIQUE: Contiguous axial images were obtained from the base of the skull through the vertex without intravenous contrast. COMPARISON:  MR 04/11/2018, no prior head CT FINDINGS: Brain: No acute intracranial hemorrhage. No midline shift or mass effect. Gray-white differentiation maintained. Unremarkable appearance of the ventricular system. Brain volume loss. Patchy hypodensity in the periventricular white matter. Vascular: Vascular calcifications Skull: No acute fracture.  No aggressive bone lesion identified. Sinuses/Orbits: Unremarkable appearance of the orbits. Mastoid air cells clear. No middle ear effusion. No significant sinus disease. Other: None IMPRESSION: Negative for acute intracranial abnormality. Evidence of chronic microvascular ischemic disease. Electronically Signed   By: Corrie Mckusick D.O.   On: 05/02/2018 19:01   Dg Shoulder Left  Result Date: 05/02/2018 CLINICAL DATA:  85 year old female with a fall EXAM: LEFT SHOULDER - 2+ VIEW COMPARISON:  None. FINDINGS: Osteopenia. Glenohumeral joint appears congruent. No acute displaced fracture. Unremarkable clavicle. Degenerative changes of the acromioclavicular joint. Significant subacromial spurring. Rounded calcifications overlying the humerus likely representing loose bodies. IMPRESSION: Negative for acute bony abnormality. Degenerative changes Electronically Signed   By: Corrie Mckusick D.O.   On: 05/02/2018 19:00    Procedures Procedures (including  critical care time)  Medications Ordered in ED Medications  sodium chloride 0.9 % bolus 500 mL (0 mLs Intravenous Stopped 05/02/18 2021)    Followed by  0.9 %  sodium chloride infusion (1,000 mLs Intravenous New Bag/Given 05/02/18 1943)     Initial Impression / Assessment and Plan / ED Course  I have reviewed the triage vital signs and the nursing notes.  Pertinent labs & imaging results that were available during my care of the patient were reviewed by me and considered in my medical decision making (see chart for details).  Clinical Course as of May 02 2031  Wed May 02, 2018  1918 Discussed with son.  Pt fell last night at the [facility].  According to the staff she had been walking previously but  she required a lot of effort to get up today and just get in the car to go home from the nursing facility.  Pt has been complaining of pain in the groin area.  Pt is requiring more assistance than expected and has not been able to get off the cough. Son is not able to take care of her.   [JK]  2004 Discussed findings with patient.  She states she has not been walking well even before leaving the facility.   [JK]    Clinical Course User Index [JK] Dorie Rank, MD     Patient presented to the emergency room for evaluation of persistent weakness.  Patient was just discharged from a nursing facility.  Patient actually fell prior to her discharge.  Son states he is unable to care for her.  Patient does appear generally ill and frail.  Her laboratory tests are notable for an elevation white blood cell count but I do not see any definitive signs of infection.  She is not coughing she denies vomiting and diarrhea.  No evidence of cellulitis on exam.  No evidence of urinary tract infection.  Patient shows chronic renal sufficiency but this is stable.  Her x-rays and CT scans do not show any acute findings.  I think the patient would be stable to return to a nursing facility.  We will involve social work  further assistance.    Final Clinical Impressions(s) / ED Diagnoses   Final diagnoses:  Fall, initial encounter  Failure to thrive (0-17)        Dorie Rank, MD 05/02/18 2121

## 2018-05-02 NOTE — ED Triage Notes (Signed)
Patient fell at the [facility] last night while she was there for rehab and DCd to home today. Patient says she was sitting on the side of the bed and it unknown if she hit her head.

## 2018-05-02 NOTE — Progress Notes (Signed)
Consult request has been received. CSW attempting to follow up at present time  Saabir Blyth M. Kaylia Winborne LCSWA Transitions of Care  Clinical Social Worker  Ph: [phone] 

## 2018-05-02 NOTE — ED Notes (Signed)
Patient transported to CT 

## 2018-05-03 DIAGNOSIS — E860 Dehydration: Secondary | ICD-10-CM | POA: Diagnosis not present

## 2018-05-03 DIAGNOSIS — C7910 Secondary malignant neoplasm of unspecified urinary organs: Secondary | ICD-10-CM | POA: Diagnosis not present

## 2018-05-03 DIAGNOSIS — Z7189 Other specified counseling: Secondary | ICD-10-CM | POA: Diagnosis not present

## 2018-05-03 DIAGNOSIS — Z66 Do not resuscitate: Secondary | ICD-10-CM

## 2018-05-03 DIAGNOSIS — R5381 Other malaise: Secondary | ICD-10-CM

## 2018-05-03 DIAGNOSIS — R000 Tachycardia, unspecified: Secondary | ICD-10-CM

## 2018-05-03 DIAGNOSIS — Z515 Encounter for palliative care: Secondary | ICD-10-CM | POA: Diagnosis not present

## 2018-05-03 DIAGNOSIS — R6251 Failure to thrive (child): Secondary | ICD-10-CM

## 2018-05-03 DIAGNOSIS — W19XXXA Unspecified fall, initial encounter: Secondary | ICD-10-CM | POA: Diagnosis not present

## 2018-05-03 LAB — CBC
HCT: 41.1 % (ref 36.0–46.0)
Hemoglobin: 12.9 g/dL (ref 12.0–15.0)
MCH: 31.1 pg (ref 26.0–34.0)
MCHC: 31.4 g/dL (ref 30.0–36.0)
MCV: 99 fL (ref 80.0–100.0)
Platelets: 345 10*3/uL (ref 150–400)
RBC: 4.15 MIL/uL (ref 3.87–5.11)
RDW: 14.6 % (ref 11.5–15.5)
WBC: 12.2 10*3/uL — ABNORMAL HIGH (ref 4.0–10.5)
nRBC: 0 % (ref 0.0–0.2)

## 2018-05-03 LAB — GLUCOSE, CAPILLARY
Glucose-Capillary: 113 mg/dL — ABNORMAL HIGH (ref 70–99)
Glucose-Capillary: 138 mg/dL — ABNORMAL HIGH (ref 70–99)

## 2018-05-03 LAB — MRSA PCR SCREENING: MRSA by PCR: NEGATIVE

## 2018-05-03 MED ORDER — ENSURE ENLIVE PO LIQD
237.0000 mL | Freq: Two times a day (BID) | ORAL | Status: DC
  Administered 2018-05-03 – 2018-05-04 (×2): 237 mL via ORAL

## 2018-05-03 MED ORDER — INSULIN ASPART 100 UNIT/ML ~~LOC~~ SOLN
0.0000 [IU] | Freq: Three times a day (TID) | SUBCUTANEOUS | Status: DC
  Administered 2018-05-04: 2 [IU] via SUBCUTANEOUS
  Administered 2018-05-04: 1 [IU] via SUBCUTANEOUS

## 2018-05-03 MED ORDER — INSULIN ASPART 100 UNIT/ML ~~LOC~~ SOLN: 0.0000 [IU] | Freq: Every day | SUBCUTANEOUS | Status: DC

## 2018-05-03 MED ORDER — SODIUM CHLORIDE 0.9 % IV SOLN
INTRAVENOUS | Status: AC
  Administered 2018-05-03: 16:00:00 via INTRAVENOUS

## 2018-05-03 NOTE — Consult Note (Signed)
Consultation Note Date: 05/03/2018   Patient Name: [name]  DOB: 1932/10/24  MRN: [id number]  Age / Sex: 85 y.o., female  PCP: [name], MD Referring Physician: Barton Dubois, MD  Reason for Consultation: Establishing goals of care  HPI/Patient Profile: 85 y.o. female  with past medical history of metastatic urothelial cancer to liver s/p left laparoscopic nephrectomy/adrenalectomy 03/10/2018, hypertension, DM, depression, osteoarthritis admitted on 05/02/2018 with weakness and recurrent falls. In ED, pelvic, left shoulder, and head CT negative for acute abnormality. CT head and UA negative for acute. Followed by [name] for stage IV urothelia carcinoma with mets to liver. Plan was to observe post-op and repeat imaging/evaluate chemotherapy vs immunotherapy options. Oncology notes reviewed. Palliative medicine consultation for goals of care.   Clinical Assessment and Goals of Care:  I have reviewed medical records, discussed with Dr. Dyann Kief, and assessed the patient at bedside. [name] is sitting up in chair. She is alert to person and place but pleasantly confused and unable to participate in [place] discussion. She is frustrated with repeat hospitalization and asking if [name] plans to pick her up today. She remembers she had a fall but none of the events following the fall. She cannot speak to her conversations with care team or oncologist. She does not appear to be in distress or discomfort.   Shortly after, spoke with son Allison Quarry) via telephone to discuss diagnosis, [place], EOL wishes, disposition and options. Due to Covid-19 visitor restriction, unable to meet in person.   Introduced Palliative Medicine as specialized medical care for people living with serious illness. It focuses on providing relief from the symptoms and stress of a serious illness. The goal is to improve quality of life  for both the patient and the family.  We discussed a brief life review of the patient. Prior to health decline from cancer diagnosis, [name] shares that his mother was fairly independent and oriented until December. She was riding an exercise bike 3x a week and still able to cook/clean. She did require walker/cane assist with ambulation.   Discussed recent events including diagnosis of cancer and surgery in late February. [name] expresses his concerns that her cognitive status has not been the same since after her surgical procedure. MRI was negative for mets but did show chronic bilateral infarcts and small vessel disease. He shares that in the last month, she has been intermittently confused, now requiring assist with ADL's, and talking to deceased family members. Appetite has been poor with ~25 lb weight loss. [name] does describe history of hiatal hernia and acid reflux, impacting her ability to eat certain foods and tolerate large meals.   Discussed events leading up to hospital admission. Patient discharged from SNF yesterday. [name] struggled with getting her into the house and she remains extremely weak despite rehab. He is unable to care for her in this current state.   Explored [name]'s understanding of cancer diagnoses, expectations moving forward, and prognosis. [name] shares his conversation with [name] in the fact that his  mother needs to become stronger with increased appetite before considering chemo or immunotherapy. Reviewed Dr. Hazeline Junker notes and that treatment for this stage IV cancer is palliative in nature. At this point in the conversation, [name] shares that their family "believes in God" and leaving it in "God's hands." He shares that his mother is a strong-willed individual and states she will "live till 52."   Compassionately explained that [name] is faced with big decisions regarding goals of care, especially if his mother's functional, nutritional, and cognitive status continue to  decline and that she is not a candidate for oncology interventions.   [name] wishes to further f/u with oncology for repeat CT scan (scheduled 06/12/18) and oncology appointment (scheduled 06/14/2018). Encouraged [name] to write down questions or concerns for oncologist and ask open, honest questions regarding expectations and prognosis.  [name] does speak of the importance of getting her back to SNF to attempt further rehab. He does plan to start the Medicaid process.    I attempted to elicit values and goals of care important to the patient and son. Advanced directives, concepts specific to code status, artifical feeding and hydration, and rehospitalization were considered and discussed. [name] explains that his mother does have a living will and he is POA. [name] shares that she has not spoken of EOL wishes and has left decisions up to him. Educated on medical recommendation against heroic interventions including resuscitation/life support with underlying frailty, age, and stage IV cancer diagnosis. Discussed code blue scenario in detail. [name] said he signed a 'DNR' form at the facility but he is unaware of a gold form. He shares that he would not want resuscitation, understanding she is frail and her ribs would break.   Introduced and discussed MOST form in detail. [name] is agreeable to complete via telephone. Family wishes include: DNR/DNI in the event of cardiac arrest, limited additional interventions including CPAP/BiPAP and re-hospitalization, IVF/ABX if indicated, and undecided on feeding tube. Durable DNR completed. Copies made for chart and son. Hard Choices booklet and copies placed in patient belonging bag for son.   Encouraged [name] to ask whichever SNF she is discharged to, if they have outpatient palliative services. Explained that outpatient palliative is unfortunately limited in [facility].   Questions and concerns were addressed. PMT contact information given.    SUMMARY OF  RECOMMENDATIONS    Good initial palliative discussion with son Allison Quarry). Patient is pleasantly confused and unable to participate in [place] discussion or understand medical complexity of current conditions.   Introduced and completed MOST form. DNR/DNI in event of cardiac arrest, limited interventions including re-hospitalization, CPAP/BiPAP if indicated, IVF/ABX if indicated, undecided on feeding tube. Durable DNR completed. Copies made for chart and son. Hard choices booklet and copies placed in patient belonging bag for son.   Son wishes to f/u with outpatient oncology. Scheduled repeat CT scan 06/12/18 and oncology f/u 06/14/18. He does acknowledge that with current functional and nutritional status, she is a poor candidate for oncology interventions.   Son unable to care for her at home. Wishes to pursue SNF placement for rehab. Plans to apply for Medicaid. SW following.  May benefit from outpatient palliative referral but unfortunately limited in [facility].   Code Status/Advance Care Planning:  DNR  Symptom Management:   Per attending  Palliative Prophylaxis:   Aspiration, Delirium Protocol, Oral Care and Turn Reposition  Additional Recommendations (Limitations, Scope, Preferences):  DNR/DNI, otherwise continue current plan of care and medical management  Psycho-social/Spiritual:  Desire for further Chaplaincy support:yes  Additional Recommendations: Caregiving  Support/Resources  Prognosis:   Unable to determine: guarded with metastatic urothelial cancer and progressive decline in functional, cognitive, and nutritional status.   Discharge Planning: SNF for rehab     Primary Diagnoses: Present on Admission:  FTT (failure to thrive) in adult   I have reviewed the medical record, interviewed the patient and family, and examined the patient. The following aspects are pertinent.  Past Medical History:  Diagnosis Date   Abdominal pain    Anemia      Calcification of aorta ([place]) 11/16/2012   Coronary artery calcification 02/14/2018   cardiologist-- dr t. Oval Linsey   Depression    Diabetes mellitus type 2, diet-controlled ([place])    pt denies   DJD (degenerative joint disease)    DM II (diabetes mellitus, type II), controlled 11/16/2012   Dysuria    Epistaxis    Family history of adverse reaction to anesthesia    sister--- ponv   Fibromyalgia    GERD (gastroesophageal reflux disease)    Goiter    Gross hematuria    Headache(784.0)    tension headache daily per pt    Heart murmur    Hiatal hernia    History of vertebral compression fracture    Hypertension    Hypothyroidism    endocrinologist-- dr balan   Left adrenal mass [facility])    Left renal mass    Left thyroid nodule    hx biopsy 2013, benign   Macular degeneration    both  left > right   Osteoarthritis    Osteoporosis    Peripheral vascular disease ([place])    varicose veins in left arm    Personal history of colon cancer, stage III oncologist-- dr Alvy Bimler (note in epic no recurrence)   2003---  s/p  right hemicolectomy 10-03-2001  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;  and completed chemo 04-2002  (Stage IIIB, pT3 N1 M0)   PONV (postoperative nausea and vomiting)    Renal cell cancer, left [facility])    oncologist-- dr Alen Blew   TMJ syndrome    right    UTI (urinary tract infection) 02/28/2018   Wears glasses    Social History   Socioeconomic History   Marital status: Single    Spouse name: Not on file   Number of children: 1   Years of education: Not on file   Highest education level: Not on file  Occupational History    Comment: retired  Scientist, product/process development strain: Not on file   Food insecurity:    Worry: Not on file    Inability: Not on Lexicographer needs:    Medical: Not on file    Non-medical: Not on file  Tobacco Use   Smoking status: Never Smoker   Smokeless tobacco:  Never Used  Substance and Sexual Activity   Alcohol use: No   Drug use: No   Sexual activity: Never    Birth control/protection: Post-menopausal  Lifestyle   Physical activity:    Days per week: Not on file    Minutes per session: Not on file   Stress: Not on file  Relationships   Social connections:    Talks on phone: Not on file    Gets together: Not on file    Attends religious service: Not on file    Active member of club or organization: Not on file    Attends meetings  of clubs or organizations: Not on file    Relationship status: Not on file  Other Topics Concern   Not on file  Social History Narrative   Not on file   Family History  Problem Relation Age of Onset   Heart disease Mother        Enlarged heart   Arrhythmia Mother    Diabetes Mother    Heart attack Mother    Heart disease Brother        MI at unknown age   Anesthesia problems Brother    Heart disease Father    Heart attack Father    Cancer Son        non-hodgkins lymphoma   Stroke Sister    Heart attack Other        MI at age 77   Heart disease Brother        CABG   AAA (abdominal aortic aneurysm) Brother    CAD Brother    Scheduled Meds:  amLODipine  10 mg Oral q morning - 10a   aspirin EC  81 mg Oral Daily   docusate sodium  100 mg Oral q morning - 10a   dronabinol  2.5 mg Oral BID AC   enoxaparin (LOVENOX) injection  30 mg Subcutaneous Q24H   estradiol  0.5 mg Oral q morning - 10a   insulin aspart  0-5 Units Subcutaneous QHS   insulin aspart  0-9 Units Subcutaneous TID WC   levothyroxine  200 mcg Oral Q0600   medroxyPROGESTERone  2.5 mg Oral q morning - 10a   metoprolol succinate  50 mg Oral q morning - 10a   nortriptyline  50 mg Oral QHS   pantoprazole  40 mg Oral Daily   Continuous Infusions:  PRN Meds:.acetaminophen **OR** acetaminophen, ondansetron **OR** ondansetron (ZOFRAN) IV, polyethylene glycol, traMADol Medications Prior to Admission:    Prior to Admission medications   Medication Sig Start Date End Date Taking? Authorizing Provider  acetaminophen (TYLENOL) 325 MG tablet Take 650 mg by mouth 2 (two) times daily.   Yes [provider]  amLODipine (NORVASC) 5 MG tablet Take 1 tablet (5 mg total) by mouth daily. Patient taking differently: Take 10 mg by mouth every morning.  04/16/18  Yes Hosie Poisson, MD  aspirin EC 81 MG tablet Take 81 mg by mouth daily.   Yes [provider]  Cholecalciferol (VITAMIN D) 50 MCG (2000 UT) CAPS Take 1 capsule by mouth every morning.   Yes [provider]  docusate sodium (COLACE) 100 MG capsule Take 100 mg by mouth every morning.    Yes [provider]  dronabinol (MARINOL) 2.5 MG capsule Take 2.5 mg by mouth 2 (two) times daily before a meal.   Yes [provider]  estradiol (ESTRACE) 0.5 MG tablet Take 0.5 mg by mouth every morning.  05/16/16  Yes [provider]  ferrous sulfate 325 (65 FE) MG tablet Take 325 mg by mouth 2 (two) times daily.   Yes [provider]  levothyroxine (SYNTHROID, LEVOTHROID) 200 MCG tablet Take 200 mcg by mouth daily before breakfast.   Yes [provider]  medroxyPROGESTERone (PROVERA) 5 MG tablet Take 2.5 mg by mouth every morning.    Yes [provider]  metoprolol succinate (TOPROL-XL) 50 MG 24 hr tablet Take 50 mg by mouth every morning.    Yes [provider]  Multiple Vitamins-Minerals (CENTRUM SILVER ULTRA WOMENS PO) Take 1 tablet by mouth every evening.  Yes [provider]  Multiple Vitamins-Minerals (PRESERVISION AREDS PO) Take 2 capsules by mouth daily.    Yes [provider]  nortriptyline (PAMELOR) 50 MG capsule Take 50 mg by mouth at bedtime.    Yes [provider]  omeprazole (PRILOSEC) 20 MG capsule Take 20 mg by mouth daily.   Yes [provider]  Polyethyl Glycol-Propyl Glycol (SYSTANE ULTRA OP) Place 2 drops into both eyes at  bedtime.   Yes [provider]  traMADol (ULTRAM) 50 MG tablet Take 50 mg by mouth every 4 (four) hours as needed for moderate pain or severe pain.   Yes [provider]  vitamin B-12 (CYANOCOBALAMIN) 1000 MCG tablet Take 1,000 mcg by mouth daily.   Yes [provider]  vitamin C (ASCORBIC ACID) 500 MG tablet Take 500 mg by mouth daily.    Yes [provider]  Vitamin D, Ergocalciferol, (DRISDOL) 50000 units CAPS capsule take ONE CAPSULE BY MOUTH every 7 DAYS Patient taking differently: Take 50,000 Units by mouth every Monday.  08/19/15  Yes Kem Boroughs, FNP   Allergies  Allergen Reactions   Tape Rash    Blisters   Latex Rash and Other (See Comments)    blister   Lidocaine Nausea And Vomiting   Sulfa Antibiotics Nausea And Vomiting   Review of Systems  Unable to perform ROS: Mental status change    Physical Exam Vitals signs and nursing note reviewed.  Constitutional:      General: She is awake.     Appearance: She is cachectic. She is ill-appearing.  HENT:     Head: Normocephalic and atraumatic.  Pulmonary:     Effort: No tachypnea, accessory muscle usage or respiratory distress.  Abdominal:     Tenderness: There is no abdominal tenderness.  Skin:    General: Skin is warm and dry.  Neurological:     Mental Status: She is alert.     Comments: Oriented to time/place. Pleasant confusion  Psychiatric:        Cognition and Memory: Cognition is impaired.     Comments: Irritable    Vital Signs: BP (!) 141/97 (BP Location: Right Arm)    Pulse (!) 115    Temp 97.8 F (36.6 C) (Oral)    Resp 18    Ht 5\' 5"  (1.651 m)    Wt 48.4 kg    LMP 01/17/1994    SpO2 94%    BMI 17.76 kg/m  Pain Scale: 0-10   Pain Score: Asleep(resp even and unlabored.)   SpO2: SpO2: 94 % O2 Device:SpO2: 94 % O2 Flow Rate: .   IO: Intake/output summary:   Intake/Output Summary (Last 24 hours) at 05/03/2018 1512 Last data filed at 05/03/2018 1300 Gross per 24  hour  Intake 2039.68 ml  Output 663 ml  Net 1376.68 ml    LBM:   Baseline Weight: Weight: 48.4 kg Most recent weight: Weight: 48.4 kg     Palliative Assessment/Data: PPS 50%   Flowsheet Rows     Most Recent Value  Intake Tab  Referral Department  Hospitalist  Unit at Time of Referral  Med/Surg Unit  Palliative Care Primary Diagnosis  Cancer  Palliative Care Type  New Palliative care  Reason for referral  Clarify Goals of Care  Date first seen by Palliative Care  05/03/18  Clinical Assessment  Palliative Performance Scale Score  50%  Psychosocial & Spiritual Assessment  Palliative Care Outcomes  Patient/Family meeting held?  Yes  Who  was at the meeting?  son  Palliative Care Outcomes  Clarified goals of care, Provided psychosocial or spiritual support, Provided end of life care assistance, Completed durable DNR, ACP counseling assistance, Provided advance care planning, Changed CPR status, Linked to palliative care logitudinal support      Time In: 1300 Time Out: 1415 Time Total: 75 Greater than 50%  of this time was spent counseling and coordinating care related to the above assessment and plan.  Signed by:  Ihor Dow, FNP-C Palliative Medicine Team  Phone: [phone] Fax: [phone]   Please contact Palliative Medicine Team phone at [phone] for questions and concerns.  For individual provider: See Shea Evans

## 2018-05-03 NOTE — NC FL2 (Signed)
[place] LEVEL OF CARE SCREENING TOOL     IDENTIFICATION  Patient Name: [name] Birthdate: 01-Mar-1932 Sex: female Admission Date (Current Location): 05/02/2018  [facility] and [place] Number:  [employer] and Address:  [facility] [address] [place]      Provider Number: [id number]  Attending Physician Name and Address:  Barton Dubois, MD  Relative Name and Phone Number:  Allison Quarry # [phone]    Current Level of Care: Hospital Recommended Level of Care: [place] Prior Approval Number:    Date Approved/Denied:   PASRR Number: [id number]  Discharge Plan: SNF    Current Diagnoses: Patient Active Problem List   Diagnosis Date Noted  . FTT (failure to thrive) in adult 05/02/2018  . Acute encephalopathy 04/13/2018  . Mild renal insufficiency 04/13/2018  . LFTs abnormal 04/13/2018  . Hypertensive urgency 04/13/2018  . Protein-calorie malnutrition, severe 03/12/2018  . Renal mass 03/09/2018  . Coronary artery calcification 02/14/2018  . Near syncope 02/02/2018  . Orthostatic hypotension 02/02/2018  . Anemia 02/02/2018  . Suprapubic discomfort 07/06/2017  . Vitamin D deficiency 08/19/2015  . Closed right hip fracture ([place]) 05/30/2015  . Chest pain syndrome 05/30/2015  . Fall at home 05/30/2015  . Closed right femoral fracture ([place]) 05/30/2015  . Displaced fracture of right femoral neck ([place]) 05/30/2015  . Fall   . Cough 07/02/2013  . Other dyspnea and respiratory abnormality 07/02/2013  . History of colon cancer, stage III 07/02/2013  . Chronic cholecystitis with calculus s/p lap chole 11/17/2012 11/19/2012  . Hyponatremia 11/16/2012  . GERD (gastroesophageal reflux disease) 11/16/2012  . Choledocholithiasis with obstruction 11/16/2012  . Calcification of aorta ([facility]) 11/16/2012  . Atherosclerosis 11/16/2012  . Hypothyroidism 11/15/2012  . Nonspecific (abnormal) findings on  radiological and other examination of gastrointestinal tract 11/08/2012  . S/P right knee replacement 12/22/2010  . Preop cardiovascular exam 12/08/2010  . Essential hypertension 12/08/2010  . Hyperlipidemia 12/08/2010    Orientation RESPIRATION BLADDER Height & Weight     Self, Place  Normal Continent Weight: 48.4 kg Height:  5\' 5"  (165.1 cm)  BEHAVIORAL SYMPTOMS/MOOD NEUROLOGICAL BOWEL NUTRITION STATUS      Continent Diet  AMBULATORY STATUS COMMUNICATION OF NEEDS Skin   Extensive Assist Verbally Normal                       Personal Care Assistance Level of Assistance  Bathing, Feeding, Dressing Bathing Assistance: Maximum assistance Feeding assistance: Independent Dressing Assistance: Limited assistance     Functional Limitations Info  Sight, Hearing, Speech Sight Info: Adequate Hearing Info: Adequate Speech Info: Adequate    SPECIAL CARE FACTORS FREQUENCY  PT (By licensed PT)     PT Frequency: 5X/W              Contractures Contractures Info: Not present    Additional Factors Info  Code Status, Allergies Code Status Info: Full Allergies Info: Latex, Tape, Sulfa Antibiotics, Lidocaine           Current Medications (05/03/2018):  This is the current hospital active medication list Current Facility-Administered Medications  Medication Dose Route Frequency Provider Last Rate Last Dose  . 0.9 %  sodium chloride infusion   Intravenous Continuous Emokpae, Ejiroghene E, MD 100 mL/hr at 05/03/18 0600    . acetaminophen (TYLENOL) tablet 650 mg  650 mg Oral Q6H PRN Emokpae, Ejiroghene E, MD       Or  .  acetaminophen (TYLENOL) suppository 650 mg  650 mg Rectal Q6H PRN Emokpae, Ejiroghene E, MD      . amLODipine (NORVASC) tablet 10 mg  10 mg Oral q morning - 10a Emokpae, Ejiroghene E, MD   10 mg at 05/03/18 1052  . aspirin EC tablet 81 mg  81 mg Oral Daily Emokpae, Ejiroghene E, MD   81 mg at 05/03/18 1051  . docusate sodium (COLACE) capsule 100 mg  100 mg  Oral q morning - 10a Emokpae, Ejiroghene E, MD   100 mg at 05/03/18 1053  . dronabinol (MARINOL) capsule 2.5 mg  2.5 mg Oral BID AC Emokpae, Ejiroghene E, MD   2.5 mg at 05/03/18 0751  . enoxaparin (LOVENOX) injection 30 mg  30 mg Subcutaneous Q24H Emokpae, Ejiroghene E, MD   30 mg at 05/03/18 0317  . estradiol (ESTRACE) tablet 0.5 mg  0.5 mg Oral q morning - 10a Emokpae, Ejiroghene E, MD   0.5 mg at 05/03/18 1051  . levothyroxine (SYNTHROID, LEVOTHROID) tablet 200 mcg  200 mcg Oral Q0600 Emokpae, Ejiroghene E, MD   200 mcg at 05/03/18 0546  . medroxyPROGESTERone (PROVERA) tablet 2.5 mg  2.5 mg Oral q morning - 10a Emokpae, Ejiroghene E, MD   2.5 mg at 05/03/18 1051  . metoprolol succinate (TOPROL-XL) 24 hr tablet 50 mg  50 mg Oral q morning - 10a Emokpae, Ejiroghene E, MD   50 mg at 05/03/18 1053  . nortriptyline (PAMELOR) capsule 50 mg  50 mg Oral QHS Emokpae, Ejiroghene E, MD   50 mg at 05/02/18 2322  . ondansetron (ZOFRAN) tablet 4 mg  4 mg Oral Q6H PRN Emokpae, Ejiroghene E, MD       Or  . ondansetron (ZOFRAN) injection 4 mg  4 mg Intravenous Q6H PRN Emokpae, Ejiroghene E, MD      . pantoprazole (PROTONIX) EC tablet 40 mg  40 mg Oral Daily Emokpae, Ejiroghene E, MD   40 mg at 05/03/18 1052  . polyethylene glycol (MIRALAX / GLYCOLAX) packet 17 g  17 g Oral Daily PRN Emokpae, Ejiroghene E, MD      . traMADol (ULTRAM) tablet 50 mg  50 mg Oral Q12H PRN Emokpae, Ejiroghene E, MD   50 mg at 05/02/18 2322     Discharge Medications: Please see discharge summary for a list of discharge medications.  Relevant Imaging Results:  Relevant Lab Results:   Additional Information [id number]  [place], [place]

## 2018-05-03 NOTE — Clinical Social Work Note (Addendum)
After speaking with son, sent SNF referral to [facility], [facility], [facility] and [facility]. Got bed offer from [facility].  They are checking with insurance for authorization and will be in touch with me in AM.

## 2018-05-03 NOTE — Evaluation (Signed)
Physical Therapy Evaluation Patient Details Name: [name] MRN: [id number] DOB: March 13, 1932 Today's Date: 05/03/2018   History of Present Illness  [name] is a 85 y.o. female with medical history significant for hypertension,, carcinoma with metastasis to the liver s/p left nephrectomy, who was brought to the ED from home with reports of generalized weakness.  Recent hospitalization patient was discharged to [facility].  Yesterday patient fell-unsure about the circumstances of the fall, he was sitting on the side of the bed when she fell. She was discharged home today.  Per notes she was discharged on the 15th day of the 21-day stay.     Clinical Impression  Patient limited for functional mobility as stated below secondary to BLE weakness, fatigue and poor standing balance.  Patient able transfer to [facility] to urinate demonstrating slow labored movement, limited to taking steps in room due to fatigue and tolerated sitting up in chair after therapy.   Patient will benefit from continued physical therapy in hospital and recommended venue below to increase strength, balance, endurance for safe ADLs and gait.    Follow Up Recommendations SNF    Equipment Recommendations  None recommended by PT    Recommendations for Other Services       Precautions / Restrictions Precautions Precautions: Fall Restrictions Weight Bearing Restrictions: No      Mobility  Bed Mobility Overal bed mobility: Needs Assistance Bed Mobility: Supine to Sit     Supine to sit: Min assist;Mod assist     General bed mobility comments: has difficulty scooting forward at bedside  Transfers Overall transfer level: Needs assistance Equipment used: Rolling walker (2 wheeled) Transfers: Sit to/from [facility] Sit to Stand: Min assist;Mod assist Stand pivot transfers: Min assist;Mod assist       General transfer comment: slow labored movement  Ambulation/Gait Ambulation/Gait  assistance: Mod assist Gait Distance (Feet): 12 Feet Assistive device: Rolling walker (2 wheeled) Gait Pattern/deviations: Decreased step length - right;Decreased step length - left;Decreased stride length Gait velocity: decreased   General Gait Details: slow labored cadence with decreased step/stride length, no loss of balance, limited secondary to fatigue and fair/poor standing balance  Stairs            Wheelchair Mobility    Modified Rankin (Stroke Patients Only)       Balance Overall balance assessment: Needs assistance Sitting-balance support: Feet supported Sitting balance-Leahy Scale: Fair     Standing balance support: Bilateral upper extremity supported;During functional activity Standing balance-Leahy Scale: Fair Standing balance comment: using RW                             Pertinent Vitals/Pain Pain Assessment: Faces Faces Pain Scale: Hurts little more Pain Location: when given tactile pressure to BLE when scooting forward Pain Descriptors / Indicators: Grimacing;Sore Pain Intervention(s): Limited activity within patient's tolerance;Monitored during session    [place] expects to be discharged to:: Private residence Living Arrangements: Children Available Help at Discharge: Family Type of Home: Mobile home Home Access: Ramped entrance     [place]: [employer] [place]: Environmental consultant - 4 wheels;Cane - single point;Bedside commode;Wheelchair - [employer] - 2 wheels      Prior Function Level of Independence: Independent with assistive device(s)         Comments: Household ambulator with Rollator     Hand Dominance   Dominant Hand: Right    Extremity/Trunk Assessment   Upper  Extremity Assessment Upper Extremity Assessment: Generalized weakness    Lower Extremity Assessment Lower Extremity Assessment: Generalized weakness    Cervical / Trunk Assessment Cervical / Trunk Assessment: Kyphotic   Communication   Communication: No difficulties  Cognition Arousal/Alertness: Awake/alert Behavior During Therapy: WFL for tasks assessed/performed Overall Cognitive Status: Within Functional Limits for tasks assessed                                        General Comments      Exercises     Assessment/Plan    PT Assessment Patient needs continued PT services  PT Problem List Decreased strength;Decreased activity tolerance;Decreased balance;Decreased mobility       PT Treatment Interventions Therapeutic exercise;Gait training;Stair training;Functional mobility training;Therapeutic activities;Patient/family education    PT Goals (Current goals can be found in the Care Plan section)  Acute Rehab PT Goals Patient Stated Goal: return home PT Goal Formulation: With patient Time For Goal Achievement: 05/17/18 Potential to Achieve Goals: Good    Frequency Min 3X/week   Barriers to discharge        Co-evaluation               AM-PAC PT "6 Clicks" Mobility  Outcome Measure Help needed turning from your back to your side while in a flat bed without using bedrails?: None Help needed moving from lying on your back to sitting on the side of a flat bed without using bedrails?: A Little Help needed moving to and from a bed to a chair (including a wheelchair)?: A Lot Help needed standing up from a chair using your arms (e.g., wheelchair or bedside chair)?: A Lot Help needed to walk in hospital room?: A Lot Help needed climbing 3-5 steps with a railing? : A Lot 6 Click Score: 15    End of Session   Activity Tolerance: Patient tolerated treatment well;Patient limited by fatigue Patient left: in chair;with chair alarm set Nurse Communication: Mobility status PT Visit Diagnosis: Unsteadiness on feet (R26.81);Other abnormalities of gait and mobility (R26.89);Muscle weakness (generalized) (M62.81)    Time: 3491-7915 PT Time Calculation (min) (ACUTE ONLY): 33  min   Charges:   PT Evaluation $PT Eval Moderate Complexity: 1 Mod PT Treatments $Therapeutic Activity: 23-37 mins        10:02 AM, 05/03/18 Lonell Grandchild, MPT Physical Therapist with [facility] 336 [phone] office [phone] mobile phone

## 2018-05-03 NOTE — Care Management Obs Status (Signed)
[facility] NOTIFICATION   Patient Details  Name: [name] MRN: [id number] Date of Birth: 31-May-1932   Medicare Observation Status Notification Given:  Other (see comment)(notice delivered telephonically to Pt's son Allison Quarry )    [place], LCSW 05/03/2018, 3:42 PM

## 2018-05-03 NOTE — Progress Notes (Signed)
Inpatient Diabetes Program Recommendations  AACE/ADA: New Consensus Statement on Inpatient Glycemic Control   Target Ranges:  Prepandial:   less than 140 mg/dL      Peak postprandial:   less than 180 mg/dL (1-2 hours)      Critically ill patients:  140 - 180 mg/dL   Results for [name], [name] (MRN [id number]) as of 05/03/2018 09:27  Ref. Range 05/02/2018 18:20  Glucose Latest Ref Range: 70 - 99 mg/dL 185 (H)   Results for [name], [name] (MRN [id number]) as of 05/03/2018 09:27  Ref. Range 03/01/2018 09:40  Hemoglobin A1C Latest Ref Range: 4.8 - 5.6 % 7.5 (H)   Review of Glycemic Control  Diabetes history: DM2  Outpatient Diabetes medications: None Current orders for Inpatient glycemic control: None  Inpatient Diabetes Program Recommendations:   Correction (SSI): While inpatient, please consider ordering CBGs ACHS with Novolog 0-9 units TID with meals and Novolog 0-5 units QHS.  Thanks, Barnie Alderman, RN, MSN, CDE Diabetes Coordinator Inpatient Diabetes Program [phone] (Team Pager from 8am to 5pm)

## 2018-05-03 NOTE — Plan of Care (Signed)
  Problem: Pain Managment: Goal: General experience of comfort will improve Outcome: Progressing   

## 2018-05-03 NOTE — Plan of Care (Signed)
  Problem: Acute Rehab PT Goals(only PT should resolve) Goal: Pt Will Go Supine/Side To Sit Outcome: Progressing Flowsheets (Taken 05/03/2018 1003) Pt will go Supine/Side to Sit: with min guard assist Goal: Patient Will Transfer Sit To/From Stand Outcome: Progressing Flowsheets (Taken 05/03/2018 1003) Patient will transfer sit to/from stand: with min guard assist Goal: Pt Will Transfer Bed To Chair/Chair To Bed Outcome: Progressing Flowsheets (Taken 05/03/2018 1003) Pt will Transfer Bed to Chair/Chair to Bed: min guard assist Goal: Pt Will Ambulate Outcome: Progressing Flowsheets (Taken 05/03/2018 1003) Pt will Ambulate: with minimal assist; 50 feet; with rolling walker   10:04 AM, 05/03/18 Lonell Grandchild, MPT Physical Therapist with [facility] [phone] office [phone] mobile phone

## 2018-05-03 NOTE — Progress Notes (Signed)
PROGRESS NOTE    SHERRYN POLLINO  MIW:[id number] DOB: 05-02-1932 DOA: 05/02/2018 PCP: [name], MD     Brief Narrative:  85 year old female with a medical history significant for hypertension, urothelial carcinoma with metastases to the liver (status post left nephrectomy), who was brought to the emergency department from home with reports of generalized weakness and failure to thrive.  Patient has a recent admission secondary to encephalopathy and UTI and she was discharged to [facility] for rehabilitation.  On the day prior to admission she apparently experienced a mechanical fall while at the facility and since then has been requiring increased level of assistance to transfer positions and to ambulate.  She was still discharged home from the facility after 21 days stay for rehabilitation.  At home patient's son unable to care after her due to his own physical disability.  Work-up in the emergency department demonstrated no acute infection, mild dehydration and just generally weak and frail elderly woman.    Assessment & Plan: Failure to thrive -Will change diet to mechanical soft to facilitate intake -Start Ensure twice a day -Patient received fluid resuscitation overnight -No physical findings of severe dehydration currently.  Tachycardia -Most likely associated with prior to admission dehydration -Resolve after fluids given -Electrolytes are within normal limits -Will discontinue telemetry.  High-grade urothelial carcinoma -Patient is status left nephrectomy -With concern for hepatic metastasis -Patient with a schedule outpatient follow-up with [name] to repeat imaging studies and reevaluate for potential chemo versus immunotherapy. -Palliative care has met with patient and his goals with patient's son; decision has been made for DNR/DNI; but otherwise continue treating what is treatable.  Essential hypertension -Continue Norvasc metoprolol  Recurrent  falls -Head CT, pelvic and left shoulder x-rays without any acute abnormalities or fractures. -Will continue PRN pain medications -Appreciate evaluation by physical therapy; who recommended a skilled nursing facility for further care and conditioning at this time.  Severe protein calorie malnutrition -As mentioned above patient will be started on Ensure twice daily -encourage to increase oral intake.   DVT prophylaxis: Lovenox Code Status: DNR/DNI Family Communication: No family at bedside Disposition Plan: Waiting on insurance authorization/bed availability and nursing on for placement to pursuit rehabilitation.  Consultants:   Palliative care  Procedures:   Below for x-ray reports  Antimicrobials:  Anti-infectives (From admission, onward)   None       Subjective: Generalized weakness, feeling tired with poor appetite.  Objective: Vitals:   05/02/18 2224 05/03/18 0000 05/03/18 0519 05/03/18 1413  BP: (!) 172/93  (!) 164/93 (!) 141/97  Pulse: (!) 118  (!) 120 (!) 115  Resp: _0 Temp: 97.8 F (36.6 C)  97.8 F (36.6 C)   TempSrc: Oral  Oral   SpO2: 99%  99% 94%  Weight:  48.4 kg    Height:  _1  (1.651 m)      Intake/Output Summary (Last 24 hours) at 05/03/2018 1512 Last data filed at 05/03/2018 1300 Gross per 24 hour  Intake 2039.68 ml  Output 663 ml  Net 1376.68 ml   Filed Weights   05/03/18 0000  Weight: 48.4 kg    Examination: General exam: Alert, awake, oriented x 2; denies chest pain, no shortness of breath, no nausea, no vomiting.  Patient is afebrile.  Frail, underweight and chronically ill in appearance. Respiratory system: Clear to auscultation. Respiratory effort normal. Cardiovascular system:RRR. No murmurs, rubs, gallops. Gastrointestinal system: Abdomen is nondistended, soft and nontender. No organomegaly or masses  felt. Normal bowel sounds heard. Central nervous system: Alert and oriented. No focal neurological  deficits. Extremities: No cyanosis or clubbing. Skin: No rashes, lesions or ulcers Psychiatry: No hallucinations, no suicidal ideation.    Data Reviewed: I have personally reviewed following labs and imaging studies  CBC: Recent Labs  Lab 05/02/18 1820 05/03/18 0611  WBC 13.0* 12.2*  NEUTROABS 10.8*  --   HGB 12.7 12.9  HCT 40.0 41.1  MCV 98.3 99.0  PLT 387 409   Basic Metabolic Panel: Recent Labs  Lab 05/02/18 1820  NA 139  K 4.3  CL 100  CO2 29  GLUCOSE 185*  BUN 32*  CREATININE 1.19*  CALCIUM 10.4*   GFR: Estimated Creatinine Clearance: 27.4 mL/min (A) (by C-G formula based on SCr of 1.19 mg/dL (H)).  Urine analysis:    Component Value Date/Time   COLORURINE AMBER (A) 05/02/2018 1827   APPEARANCEUR CLEAR 05/02/2018 1827   LABSPEC 1.025 05/02/2018 1827   PHURINE 5.0 05/02/2018 1827   GLUCOSEU NEGATIVE 05/02/2018 1827   HGBUR NEGATIVE 05/02/2018 1827   BILIRUBINUR NEGATIVE 05/02/2018 1827   BILIRUBINUR neg 07/29/2013 0952   KETONESUR 5 (A) 05/02/2018 1827   PROTEINUR 30 (A) 05/02/2018 1827   UROBILINOGEN negative 07/29/2013 0952   UROBILINOGEN 0.2 12/24/2010 0729   NITRITE NEGATIVE 05/02/2018 1827   LEUKOCYTESUR NEGATIVE 05/02/2018 1827    Recent Results (from the past 240 hour(s))  MRSA PCR Screening     Status: None   Collection Time: 05/03/18  3:04 AM  Result Value Ref Range Status   MRSA by PCR NEGATIVE NEGATIVE Final    Comment:        The GeneXpert MRSA Assay (FDA approved for NASAL specimens only), is one component of a comprehensive MRSA colonization surveillance program. It is not intended to diagnose MRSA infection nor to guide or monitor treatment for MRSA infections. Performed at [facility], [address] [place], [place] [address]      Radiology Studies: Dg Chest 1 View  Result Date: 05/02/2018 CLINICAL DATA:  85 year old female with a fall EXAM: CHEST  1 VIEW COMPARISON:  04/13/2018 FINDINGS: Cardiomediastinal silhouette  unchanged in size and contour. No evidence of central vascular congestion or interlobular septal thickening. Diffuse coarsening of interstitial markings similar to the prior. No pneumothorax or pleural effusion. No confluent airspace disease. Pleuroparenchymal thickening at the apices. No acute displaced fracture. Degenerative changes of the spine and the shoulders. IMPRESSION: Negative for acute cardiopulmonary disease Electronically Signed   By: Corrie Mckusick D.O.   On: 05/02/2018 18:59   Dg Pelvis 1-2 Views  Result Date: 05/02/2018 CLINICAL DATA:  85 year old female with fall EXAM: PELVIS - 1-2 VIEW COMPARISON:  02/27/2010 FINDINGS: Bony pelvic ring is intact. No acute fracture identified. Surgical changes of right hip arthroplasty. Components are aligned. Left hip projects normally over the acetabula. No periprosthetic fracture with unremarkable proximal right femur. Calcified mass of the abdomen, better demonstrated on CT from February. IMPRESSION: No acute bony abnormality. Right hip arthroplasty. Electronically Signed   By: Corrie Mckusick D.O.   On: 05/02/2018 19:58   Ct Head Wo Contrast  Result Date: 05/02/2018 CLINICAL DATA:  85 year old female with a fall EXAM: CT HEAD WITHOUT CONTRAST TECHNIQUE: Contiguous axial images were obtained from the base of the skull through the vertex without intravenous contrast. COMPARISON:  MR 04/11/2018, no prior head CT FINDINGS: Brain: No acute intracranial hemorrhage. No midline shift or mass effect. Gray-white differentiation maintained. Unremarkable appearance of the ventricular  system. Brain volume loss. Patchy hypodensity in the periventricular white matter. Vascular: Vascular calcifications Skull: No acute fracture.  No aggressive bone lesion identified. Sinuses/Orbits: Unremarkable appearance of the orbits. Mastoid air cells clear. No middle ear effusion. No significant sinus disease. Other: None IMPRESSION: Negative for acute intracranial abnormality.  Evidence of chronic microvascular ischemic disease. Electronically Signed   By: Corrie Mckusick D.O.   On: 05/02/2018 19:01   Dg Shoulder Left  Result Date: 05/02/2018 CLINICAL DATA:  85 year old female with a fall EXAM: LEFT SHOULDER - 2+ VIEW COMPARISON:  None. FINDINGS: Osteopenia. Glenohumeral joint appears congruent. No acute displaced fracture. Unremarkable clavicle. Degenerative changes of the acromioclavicular joint. Significant subacromial spurring. Rounded calcifications overlying the humerus likely representing loose bodies. IMPRESSION: Negative for acute bony abnormality. Degenerative changes Electronically Signed   By: Corrie Mckusick D.O.   On: 05/02/2018 19:00    Scheduled Meds:  amLODipine  10 mg Oral q morning - 10a   aspirin EC  81 mg Oral Daily   docusate sodium  100 mg Oral q morning - 10a   dronabinol  2.5 mg Oral BID AC   enoxaparin (LOVENOX) injection  30 mg Subcutaneous Q24H   estradiol  0.5 mg Oral q morning - 10a   insulin aspart  0-5 Units Subcutaneous QHS   insulin aspart  0-9 Units Subcutaneous TID WC   levothyroxine  200 mcg Oral Q0600   medroxyPROGESTERone  2.5 mg Oral q morning - 10a   metoprolol succinate  50 mg Oral q morning - 10a   nortriptyline  50 mg Oral QHS   pantoprazole  40 mg Oral Daily   Continuous Infusions:   LOS: 0 days    Time spent: 25 minutes    Barton Dubois, MD [facility] Pager [phone]   05/03/2018, 3:12 PM

## 2018-05-04 ENCOUNTER — Ambulatory Visit: Payer: Medicare Other | Admitting: Oncology

## 2018-05-04 DIAGNOSIS — W19XXXD Unspecified fall, subsequent encounter: Secondary | ICD-10-CM

## 2018-05-04 DIAGNOSIS — R627 Adult failure to thrive: Secondary | ICD-10-CM | POA: Diagnosis not present

## 2018-05-04 DIAGNOSIS — C7910 Secondary malignant neoplasm of unspecified urinary organs: Secondary | ICD-10-CM | POA: Diagnosis not present

## 2018-05-04 DIAGNOSIS — E860 Dehydration: Secondary | ICD-10-CM | POA: Diagnosis not present

## 2018-05-04 DIAGNOSIS — E43 Unspecified severe protein-calorie malnutrition: Secondary | ICD-10-CM

## 2018-05-04 LAB — GLUCOSE, CAPILLARY
Glucose-Capillary: 144 mg/dL — ABNORMAL HIGH (ref 70–99)
Glucose-Capillary: 158 mg/dL — ABNORMAL HIGH (ref 70–99)

## 2018-05-04 MED ORDER — POLYETHYLENE GLYCOL 3350 17 G PO PACK: 17.0000 g | PACK | Freq: Every day | ORAL | Status: AC | PRN

## 2018-05-04 MED ORDER — TRAMADOL HCL 50 MG PO TABS: 50.0000 mg | ORAL_TABLET | Freq: Three times a day (TID) | ORAL | 0 refills | Status: AC | PRN

## 2018-05-04 MED ORDER — ENSURE ENLIVE PO LIQD: 237.0000 mL | Freq: Two times a day (BID) | ORAL | Status: AC

## 2018-05-04 MED ORDER — AMLODIPINE BESYLATE 10 MG PO TABS: 10.0000 mg | ORAL_TABLET | Freq: Every morning | ORAL | Status: AC

## 2018-05-04 NOTE — Discharge Summary (Signed)
Physician Discharge Summary  [name] YBO:[id number] DOB: 27-Mar-1932 DOA: 05/02/2018  PCP: [name], MD  Admit date: 05/02/2018 Discharge date: 05/04/2018  Time spent: 35 minutes  Recommendations for Outpatient Follow-up:  1. Repeat basic metabolic panel to follow electrolytes and renal function 2. Reassess blood pressure and adjust antihypertensive regimen as needed 3. Outpatient follow-up with oncology service and PCP.   Discharge Diagnoses:  Active Problems:   Goals of care, counseling/discussion   FTT (failure to thrive) in adult   Palliative care by specialist   DNR (do not resuscitate)   Failure to thrive (0-17)   Tachycardia   Metastatic urothelial carcinoma (HCC)   Mild dehydration   Severe protein-calorie malnutrition Altamease Oiler: less than 60% of standard weight) ([place])   Discharge Condition: Stable.  Discharge to skilled nursing facility for further care and rehabilitation.  Outpatient follow-up with oncology service and PCP after discharge from SNF.  Diet recommendation: Regular, mechanical soft diet (while paying attention to the amount of sodium, given underlying history of hypertension).  Filed Weights   05/03/18 0000  Weight: 48.4 kg    Brief history of present illness:  85 year old female with a medical history significant for hypertension, urothelial carcinoma with metastases to the liver (status post left nephrectomy), who was brought to the emergency department from home with reports of generalized weakness and failure to thrive.  Patient has a recent admission secondary to encephalopathy and UTI and she was discharged to [facility] for rehabilitation.  On the day prior to admission she apparently experienced a mechanical fall while at the facility and since then has been requiring increased level of assistance to transfer positions and to ambulate.  She was still discharged home from the facility after 21 days stay for rehabilitation.  At home  patient's son unable to care after her due to his own physical disability.  Work-up in the emergency department demonstrated no acute infection, mild dehydration and just generally weak and frail elderly woman.   Hospital Course:  Failure to thrive -Will recommend mechanical soft diet to facilitate intake -continue Ensure twice a day -Patient received fluid resuscitation overnight -No physical findings of severe dehydration currently.  Tachycardia -Most likely associated with prior to admission dehydration -Resolved after fluids given -Electrolytes are within normal limits  High-grade urothelial carcinoma -Patient is status left nephrectomy -With concern for hepatic metastasis -Patient with a schedule outpatient follow-up with [name] to repeat imaging studies and reevaluate for potential chemotherapy versus immunotherapy. -Palliative care has met with patient and his goals with patient's son; decision has been made for DNR/DNI; but otherwise continue treating what is treatable.  Essential hypertension -Continue Norvasc and metoprolol -Blood pressure stable and well-controlled.  Recurrent falls -Head CT, pelvic and left shoulder x-rays without any acute abnormalities or fractures. -Will continue PRN pain medications -Appreciate evaluation by physical therapy; who recommended skilled nursing facility for further care and conditioning at this time.  Severe protein calorie malnutrition -As mentioned above, patient started on Ensure twice daily -encourage to increase oral intake. -Body mass index is 17.76 kg/m.  Procedures:  See below for x-ray reports.  Consultations:  None  Discharge Exam: Vitals:   05/03/18 2344 05/04/18 0533  BP: (!) 154/96 (!) 166/96  Pulse: (!) 112 (!) 117  Resp: 16 18  Temp:  (!) 97.5 F (36.4 C)  SpO2: 97% 97%   General exam: Alert, awake, oriented x 2; denies chest pain, no shortness of breath, no nausea, no vomiting.  Patient is  afebrile.  Frail, underweight and chronically ill in appearance.  In no acute distress. Respiratory system: Clear to auscultation. Respiratory effort normal. Cardiovascular system:RRR. No murmurs, rubs, gallops. Gastrointestinal system: Abdomen is nondistended, soft and nontender. No organomegaly or masses felt. Normal bowel sounds heard. Central nervous system: Alert and oriented. No focal neurological deficits. Extremities: No cyanosis or clubbing. Skin: No rashes, lesions or ulcers Psychiatry: No hallucinations, no suicidal ideation.   Discharge Instructions   Discharge Instructions    Discharge instructions   Complete by:  As directed    Maintain adequate hydration Encourage and push for feeding supplements and appropriate nutrition. Physical therapy rehabilitation as per skilled nursing facility protocol Mechanical soft diet   Increase activity slowly   Complete by:  As directed      Allergies as of 05/04/2018      Reactions   Tape Rash   Blisters   Latex Rash, Other (See Comments)   blister   Lidocaine Nausea And Vomiting   Sulfa Antibiotics Nausea And Vomiting      Medication List    TAKE these medications   acetaminophen 325 MG tablet Commonly known as:  TYLENOL Take 650 mg by mouth 2 (two) times daily.   amLODipine 10 MG tablet Commonly known as:  NORVASC Take 1 tablet (10 mg total) by mouth every morning. What changed:    medication strength  how much to take  when to take this   aspirin EC 81 MG tablet Take 81 mg by mouth daily.   CENTRUM SILVER ULTRA WOMENS PO Take 1 tablet by mouth every evening.   PRESERVISION AREDS PO Take 2 capsules by mouth daily.   docusate sodium 100 MG capsule Commonly known as:  COLACE Take 100 mg by mouth every morning.   dronabinol 2.5 MG capsule Commonly known as:  MARINOL Take 2.5 mg by mouth 2 (two) times daily before a meal.   estradiol 0.5 MG tablet Commonly known as:  ESTRACE Take 0.5 mg by mouth every  morning.   feeding supplement (ENSURE ENLIVE) Liqd Take 237 mLs by mouth 2 (two) times daily between meals.   ferrous sulfate 325 (65 FE) MG tablet Take 325 mg by mouth 2 (two) times daily.   levothyroxine 200 MCG tablet Commonly known as:  SYNTHROID Take 200 mcg by mouth daily before breakfast.   medroxyPROGESTERone 5 MG tablet Commonly known as:  PROVERA Take 2.5 mg by mouth every morning.   metoprolol succinate 50 MG 24 hr tablet Commonly known as:  TOPROL-XL Take 50 mg by mouth every morning.   nortriptyline 50 MG capsule Commonly known as:  PAMELOR Take 50 mg by mouth at bedtime.   omeprazole 20 MG capsule Commonly known as:  PRILOSEC Take 20 mg by mouth daily.   polyethylene glycol 17 g packet Commonly known as:  MIRALAX / GLYCOLAX Take 17 g by mouth daily as needed for mild constipation.   SYSTANE ULTRA OP Place 2 drops into both eyes at bedtime.   traMADol 50 MG tablet Commonly known as:  ULTRAM Take 1 tablet (50 mg total) by mouth every 8 (eight) hours as needed for up to 5 days for moderate pain or severe pain. What changed:  when to take this   vitamin B-12 1000 MCG tablet Commonly known as:  CYANOCOBALAMIN Take 1,000 mcg by mouth daily.   vitamin C 500 MG tablet Commonly known as:  ASCORBIC ACID Take 500 mg by mouth daily.   Vitamin D (Ergocalciferol) 1.25 MG (  50000 UT) Caps capsule Commonly known as:  DRISDOL take ONE CAPSULE BY MOUTH every 7 DAYS What changed:    how much to take  how to take this  when to take this  additional instructions   Vitamin D 50 MCG (2000 UT) Caps Take 1 capsule by mouth every morning.      Allergies  Allergen Reactions  . Tape Rash    Blisters  . Latex Rash and Other (See Comments)    blister  . Lidocaine Nausea And Vomiting  . Sulfa Antibiotics Nausea And Vomiting    Contact information for follow-up providers    [name], MD. Schedule an appointment as soon as possible for a visit in 2  week(s).   Specialty:  Family Medicine Why:  after discharge from SNF Contact information: [address]  [place] [address] [phone]            Contact information for after-discharge care    Destination    [facility] .   Service:  Skilled Nursing Contact information: [address] [place] [phone]                  The results of significant diagnostics from this hospitalization (including imaging, microbiology, ancillary and laboratory) are listed below for reference.    Significant Diagnostic Studies: Dg Chest 1 View  Result Date: 05/02/2018 CLINICAL DATA:  85 year old female with a fall EXAM: CHEST  1 VIEW COMPARISON:  04/13/2018 FINDINGS: Cardiomediastinal silhouette unchanged in size and contour. No evidence of central vascular congestion or interlobular septal thickening. Diffuse coarsening of interstitial markings similar to the prior. No pneumothorax or pleural effusion. No confluent airspace disease. Pleuroparenchymal thickening at the apices. No acute displaced fracture. Degenerative changes of the spine and the shoulders. IMPRESSION: Negative for acute cardiopulmonary disease Electronically Signed   By: Corrie Mckusick D.O.   On: 05/02/2018 18:59   Dg Chest 2 View  Result Date: 04/13/2018 CLINICAL DATA:  AMS EXAM: CHEST - 2 VIEW COMPARISON:  04/11/2018 FINDINGS: The heart size and mediastinal contours are within normal limits. Both lungs are clear. The visualized skeletal structures are unremarkable. IMPRESSION: No acute abnormality of the lungs.  No focal airspace opacity. Electronically Signed   By: Eddie Candle M.D.   On: 04/13/2018 20:47   Dg Chest 2 View  Result Date: 04/11/2018 CLINICAL DATA:  Altered mental status EXAM: CHEST - 2 VIEW COMPARISON:  PET-CT 01/18/2018, chest x-ray 12/20/2017 FINDINGS: There is no focal consolidation. There is no pleural effusion or  pneumothorax. The heart and mediastinal contours are unremarkable. The osseous structures are unremarkable. IMPRESSION: No active cardiopulmonary disease. Electronically Signed   By: Kathreen Devoid   On: 04/11/2018 19:08   Dg Pelvis 1-2 Views  Result Date: 05/02/2018 CLINICAL DATA:  85 year old female with fall EXAM: PELVIS - 1-2 VIEW COMPARISON:  02/27/2010 FINDINGS: Bony pelvic ring is intact. No acute fracture identified. Surgical changes of right hip arthroplasty. Components are aligned. Left hip projects normally over the acetabula. No periprosthetic fracture with unremarkable proximal right femur. Calcified mass of the abdomen, better demonstrated on CT from February. IMPRESSION: No acute bony abnormality. Right hip arthroplasty. Electronically Signed   By: Corrie Mckusick D.O.   On: 05/02/2018 19:58   Ct Head Wo Contrast  Result Date: 05/02/2018 CLINICAL DATA:  85 year old female with a fall EXAM: CT HEAD WITHOUT CONTRAST TECHNIQUE: Contiguous axial images were obtained from the base  of the skull through the vertex without intravenous contrast. COMPARISON:  MR 04/11/2018, no prior head CT FINDINGS: Brain: No acute intracranial hemorrhage. No midline shift or mass effect. Gray-white differentiation maintained. Unremarkable appearance of the ventricular system. Brain volume loss. Patchy hypodensity in the periventricular white matter. Vascular: Vascular calcifications Skull: No acute fracture.  No aggressive bone lesion identified. Sinuses/Orbits: Unremarkable appearance of the orbits. Mastoid air cells clear. No middle ear effusion. No significant sinus disease. Other: None IMPRESSION: Negative for acute intracranial abnormality. Evidence of chronic microvascular ischemic disease. Electronically Signed   By: Corrie Mckusick D.O.   On: 05/02/2018 19:01   Mr Jeri Cos KG Contrast  Result Date: 04/11/2018 CLINICAL DATA:  Urothelial carcinoma.  Confusion.  Staging. EXAM: MRI HEAD WITHOUT AND WITH CONTRAST  TECHNIQUE: Multiplanar, multiecho pulse sequences of the brain and surrounding structures were obtained without and with intravenous contrast. CONTRAST:  Gadavist 5 mL. COMPARISON:  None. FINDINGS: Brain: No evidence for acute infarction, hemorrhage, mass lesion, hydrocephalus, or extra-axial fluid. Generalized atrophy. Moderate T2 and FLAIR hyperintensities in the white matter, likely small vessel disease. Chronic BILATERAL cerebellar infarcts. Post infusion, no abnormal enhancement of the brain or meninges. Vascular: Flow voids are maintained throughout the carotid, basilar, and vertebral arteries. There are no areas of chronic hemorrhage. RIGHT vertebral dominant. Skull and upper cervical spine: Unremarkable visualized calvarium, skullbase, and cervical vertebrae. Pituitary, pineal, cerebellar tonsils unremarkable. No upper cervical cord lesions. Suspect C4-C5 spondylosis. Sinuses/Orbits: No orbital masses or proptosis. Globes appear symmetric. Sinuses appear well aerated, without evidence for air-fluid level. Other: None. IMPRESSION: Atrophy and small vessel disease, not unexpected for age. No acute intracranial findings. No evidence for metastatic disease to the brain or surrounding structures. Electronically Signed   By: Staci Righter M.D.   On: 04/11/2018 15:50   Dg Shoulder Left  Result Date: 05/02/2018 CLINICAL DATA:  85 year old female with a fall EXAM: LEFT SHOULDER - 2+ VIEW COMPARISON:  None. FINDINGS: Osteopenia. Glenohumeral joint appears congruent. No acute displaced fracture. Unremarkable clavicle. Degenerative changes of the acromioclavicular joint. Significant subacromial spurring. Rounded calcifications overlying the humerus likely representing loose bodies. IMPRESSION: Negative for acute bony abnormality. Degenerative changes Electronically Signed   By: Corrie Mckusick D.O.   On: 05/02/2018 19:00    Microbiology: Recent Results (from the past 240 hour(s))  MRSA PCR Screening     Status:  None   Collection Time: 05/03/18  3:04 AM  Result Value Ref Range Status   MRSA by PCR NEGATIVE NEGATIVE Final    Comment:        The GeneXpert MRSA Assay (FDA approved for NASAL specimens only), is one component of a comprehensive MRSA colonization surveillance program. It is not intended to diagnose MRSA infection nor to guide or monitor treatment for MRSA infections. Performed at [facility], [address] [place], [place] [address]     Labs: Basic Metabolic Panel: Recent Labs  Lab 05/02/18 1820  NA 139  K 4.3  CL 100  CO2 29  GLUCOSE 185*  BUN 32*  CREATININE 1.19*  CALCIUM 10.4*   CBC: Recent Labs  Lab 05/02/18 1820 05/03/18 0611  WBC 13.0* 12.2*  NEUTROABS 10.8*  --   HGB 12.7 12.9  HCT 40.0 41.1  MCV 98.3 99.0  PLT 387 345   BNP (last 3 results) Recent Labs    12/28/17 1251  BNP 46.0    CBG: Recent Labs  Lab 05/03/18 1636 05/03/18 2337 05/04/18 0726  GLUCAP 138* 113*  144*    Signed:  Barton Dubois MD.  [facility] 05/04/2018, 10:30 AM

## 2018-05-04 NOTE — Progress Notes (Signed)
Physical Therapy Treatment Patient Details Name: [name] MRN: [id number] DOB: 1932-08-04 Today's Date: 05/04/2018    History of Present Illness [name] is a 85 y.o. female with medical history significant for hypertension,, carcinoma with metastasis to the liver s/p left nephrectomy, who was brought to the ED from home with reports of generalized weakness.  Recent hospitalization patient was discharged to [facility].  Yesterday patient fell-unsure about the circumstances of the fall, he was sitting on the side of the bed when she fell. She was discharged home today.  Per notes she was discharged on the 15th day of the 21-day stay.     PT Comments    Pt teary through session though is willing to participate with therapy.  Min A with bed mobility, Mod A with transfer training with SPT to [facility] and min A with gait.  Pt required frequent cueing for hand placement to assist with transfer and labored movements, increased time to complete.  Pt limited by LE weakness and fatigue following short distance gait.  EOS pt left in chair with call bell within reach and chair alarm set.  No reports of increased pain through session.    Follow Up Recommendations  SNF     Equipment Recommendations  None recommended by PT    Recommendations for Other Services OT consult     Precautions / Restrictions Precautions Precautions: Fall Restrictions Weight Bearing Restrictions: No    Mobility  Bed Mobility Overal bed mobility: Needs Assistance Bed Mobility: Supine to Sit     Supine to sit: Min assist;Mod assist     General bed mobility comments: has difficulty scooting forward at bedside  Transfers Overall transfer level: Needs assistance Equipment used: Rolling walker (2 wheeled) Transfers: Sit to/from [facility] Sit to Stand: Mod assist Stand pivot transfers: Min assist;Mod assist       General transfer comment: slow labored  movement  Ambulation/Gait Ambulation/Gait assistance: Mod assist Gait Distance (Feet): 12 Feet Assistive device: Rolling walker (2 wheeled) Gait Pattern/deviations: Decreased step length - right;Decreased step length - left;Decreased stride length Gait velocity: decreased   General Gait Details: slow labored cadence with decreased step/stride length, no loss of balance, limited secondary to fatigue and fair/poor standing balance   Stairs             Wheelchair Mobility    Modified Rankin (Stroke Patients Only)       Balance                                            Cognition Arousal/Alertness: Awake/alert Behavior During Therapy: WFL for tasks assessed/performed Overall Cognitive Status: Within Functional Limits for tasks assessed                                 General Comments: pt teary through session, somewhat confused on location and misses her babies and wants to have visitors      Exercises      General Comments        Pertinent Vitals/Pain Faces Pain Scale: Hurts little more Pain Location: points to different regions when asking about pain including Lt shoulder/UE and Rt thigh Pain Descriptors / Indicators: Grimacing;Sore Pain Intervention(s): Monitored during session;Limited activity within patient's tolerance    Home Living  Prior Function            PT Goals (current goals can now be found in the care plan section)      Frequency    Min 3X/week      PT Plan      Co-evaluation              AM-PAC PT "6 Clicks" Mobility   Outcome Measure  Help needed turning from your back to your side while in a flat bed without using bedrails?: None Help needed moving from lying on your back to sitting on the side of a flat bed without using bedrails?: A Little Help needed moving to and from a bed to a chair (including a wheelchair)?: A Lot Help needed standing up from a chair  using your arms (e.g., wheelchair or bedside chair)?: A Lot Help needed to walk in hospital room?: A Lot Help needed climbing 3-5 steps with a railing? : A Lot 6 Click Score: 15    End of Session Equipment Utilized During Treatment: Gait belt Activity Tolerance: Patient tolerated treatment well;Patient limited by fatigue Patient left: in chair;with chair alarm set;with call bell/phone within reach Nurse Communication: Mobility status PT Visit Diagnosis: Unsteadiness on feet (R26.81);Other abnormalities of gait and mobility (R26.89);Muscle weakness (generalized) (M62.81)     Time: 5284-1324 PT Time Calculation (min) (ACUTE ONLY): 28 min  Charges:  $Gait Training: 8-22 mins $Therapeutic Activity: 8-22 mins                     [address] LPTA; CBIS [phone]  Aldona Lento 05/04/2018, 1:53 PM

## 2018-05-04 NOTE — TOC Transition Note (Signed)
Transition of Care [facility]) - CM/SW Discharge Note   Patient Details  Name: [name] MRN: [id number] Date of Birth: Dec 11, 1932  [facility] CM/SW Contact:  Trish Mage, LCSW Phone Number: 05/04/2018, 9:12 AM   Clinical Narrative:   Pt to transfer today    Final next level of care: Skilled Nursing Facility Barriers to Discharge: No Barriers Identified   Patient Goals and CMS Choice Patient states their goals for this hospitalization and ongoing recovery are:: to discharge  CMS Medicare.gov Compare Post Acute Care list provided to:: Patient Represenative (must comment) Choice offered to / list presented to : Adult Children  Discharge Placement   Existing PASRR number confirmed : 05/02/18          Patient chooses bed at: [facility] Patient to be transferred to facility by: [facility] Name of family member notified: Allison Quarry, son, [phone] Patient and family notified of of transfer: 05/04/18  Discharge Plan and Services In-house Referral: Clinical Social Work   Post Acute Care Choice: [place]          DME Arranged: N/A DME Agency: NA       Social Determinants of Health (SDOH) Interventions     Readmission Risk Interventions Readmission Risk Prevention Plan 04/15/2018  Transportation Screening Complete  HRI or [place] Complete  Social Work Consult for [place] Planning/Counseling Complete  Palliative Care Screening Not Applicable  Some recent data might be hidden

## 2018-05-04 NOTE — Progress Notes (Signed)
Patient discharged to [facility],report called and given to Lenord Carbo LPN. Transported by [facility] to awaiting facility.Marland Kitchen

## 2018-05-18 ENCOUNTER — Telehealth: Payer: Self-pay

## 2018-05-18 ENCOUNTER — Emergency Department (HOSPITAL_COMMUNITY)
Admission: EM | Admit: 2018-05-18 | Discharge: 2018-05-18 | Disposition: A | Discharge status: Home / Self Care | Payer: Medicare Other | Attending: Emergency Medicine | Admitting: Emergency Medicine

## 2018-05-18 ENCOUNTER — Other Ambulatory Visit: Payer: Self-pay

## 2018-05-18 ENCOUNTER — Emergency Department (HOSPITAL_COMMUNITY): Payer: Medicare Other

## 2018-05-18 DIAGNOSIS — R0789 Other chest pain: Secondary | ICD-10-CM | POA: Insufficient documentation

## 2018-05-18 DIAGNOSIS — Y92129 Unspecified place in nursing home as the place of occurrence of the external cause: Secondary | ICD-10-CM | POA: Insufficient documentation

## 2018-05-18 DIAGNOSIS — Z7982 Long term (current) use of aspirin: Secondary | ICD-10-CM | POA: Diagnosis not present

## 2018-05-18 DIAGNOSIS — Z85528 Personal history of other malignant neoplasm of kidney: Secondary | ICD-10-CM | POA: Insufficient documentation

## 2018-05-18 DIAGNOSIS — Y93B9 Activity, other involving muscle strengthening exercises: Secondary | ICD-10-CM | POA: Insufficient documentation

## 2018-05-18 DIAGNOSIS — I10 Essential (primary) hypertension: Secondary | ICD-10-CM | POA: Insufficient documentation

## 2018-05-18 DIAGNOSIS — Z91040 Latex allergy status: Secondary | ICD-10-CM | POA: Insufficient documentation

## 2018-05-18 DIAGNOSIS — F0390 Unspecified dementia without behavioral disturbance: Secondary | ICD-10-CM | POA: Insufficient documentation

## 2018-05-18 DIAGNOSIS — E039 Hypothyroidism, unspecified: Secondary | ICD-10-CM | POA: Insufficient documentation

## 2018-05-18 DIAGNOSIS — Y999 Unspecified external cause status: Secondary | ICD-10-CM | POA: Insufficient documentation

## 2018-05-18 DIAGNOSIS — S2232XA Fracture of one rib, left side, initial encounter for closed fracture: Secondary | ICD-10-CM | POA: Diagnosis not present

## 2018-05-18 DIAGNOSIS — Z85038 Personal history of other malignant neoplasm of large intestine: Secondary | ICD-10-CM | POA: Diagnosis not present

## 2018-05-18 DIAGNOSIS — X58XXXA Exposure to other specified factors, initial encounter: Secondary | ICD-10-CM | POA: Diagnosis not present

## 2018-05-18 DIAGNOSIS — Z79899 Other long term (current) drug therapy: Secondary | ICD-10-CM | POA: Diagnosis not present

## 2018-05-18 DIAGNOSIS — R079 Chest pain, unspecified: Secondary | ICD-10-CM

## 2018-05-18 LAB — CBC
HCT: 39.4 % (ref 36.0–46.0)
Hemoglobin: 12.4 g/dL (ref 12.0–15.0)
MCH: 30.9 pg (ref 26.0–34.0)
MCHC: 31.5 g/dL (ref 30.0–36.0)
MCV: 98.3 fL (ref 80.0–100.0)
Platelets: 552 10*3/uL — ABNORMAL HIGH (ref 150–400)
RBC: 4.01 MIL/uL (ref 3.87–5.11)
RDW: 15.6 % — ABNORMAL HIGH (ref 11.5–15.5)
WBC: 14.8 10*3/uL — ABNORMAL HIGH (ref 4.0–10.5)
nRBC: 0 % (ref 0.0–0.2)

## 2018-05-18 LAB — BASIC METABOLIC PANEL
Anion gap: 11 (ref 5–15)
BUN: 26 mg/dL — ABNORMAL HIGH (ref 8–23)
CO2: 28 mmol/L (ref 22–32)
Calcium: 9.2 mg/dL (ref 8.9–10.3)
Chloride: 97 mmol/L — ABNORMAL LOW (ref 98–111)
Creatinine, Ser: 1.15 mg/dL — ABNORMAL HIGH (ref 0.44–1.00)
GFR calc Af Amer: 51 mL/min — ABNORMAL LOW (ref >60)
GFR calc non Af Amer: 44 mL/min — ABNORMAL LOW (ref >60)
Glucose, Bld: 284 mg/dL — ABNORMAL HIGH (ref 70–99)
Potassium: 4.8 mmol/L (ref 3.5–5.1)
Sodium: 136 mmol/L (ref 135–145)

## 2018-05-18 LAB — TROPONIN I
Troponin I: 0.04 ng/mL (ref <0.03)
Troponin I: 0.04 ng/mL (ref <0.03)

## 2018-05-18 NOTE — Discharge Instructions (Addendum)
You were seen in the emergency department for chest pain while getting physical therapy.  You had blood work chest x-ray and an EKG here.  There was no evidence of any significant heart damage but they did see a rib fracture on the left that was not apparent during the last admission.  This may be a source of your pain.  Please follow-up with your doctor and return if any worsening symptoms.

## 2018-05-18 NOTE — ED Notes (Signed)
Pt's son updated by this RN.

## 2018-05-18 NOTE — ED Provider Notes (Signed)
[facility] EMERGENCY DEPARTMENT Provider Note   CSN: [id number] Arrival date & time: 05/18/18  0911    History   Chief Complaint Chief Complaint  Patient presents with  . Chest Pain    HPI [name] is a 85 y.o. female.  She is brought in by EMS from her facility at [facility] for evaluation of chest pain.  Per EMS chest pain began while she was doing physical therapy today.  Staff also noticed that maybe she was short of breath and cyanotic on her left hand.  EMS gave her 324 of aspirin and 100 cc bolus.  Patient arrives here currently pain-free.  Level 5 caveat secondary to dementia.  Patient does not recall having chest pain earlier.  She says she gets chest pain frequently.  She denies any cardiac history.  She denies any chest pain right now along with no shortness of breath no numbness no weakness no abdominal pain.     The history is provided by the patient.  Chest Pain  Pain location:  L chest Pain radiates to:  Does not radiate Pain severity:  Unable to specify Onset quality:  Sudden Timing:  Unable to specify Progression:  Resolved Chronicity:  Recurrent Context comment:  Exercise Relieved by:  Rest and aspirin Worsened by:  Nothing Ineffective treatments:  None tried Associated symptoms: no abdominal pain, no cough, no fever, no headache, no nausea, no shortness of breath and no vomiting     Past Medical History:  Diagnosis Date  . Abdominal pain   . Anemia   . Calcification of aorta (HCC) 11/16/2012  . Coronary artery calcification 02/14/2018   cardiologist-- dr t. Oval Linsey  . Depression   . Diabetes mellitus type 2, diet-controlled ([place])    pt denies  . DJD (degenerative joint disease)   . DM II (diabetes mellitus, type II), controlled 11/16/2012  . Dysuria   . Epistaxis   . Family history of adverse reaction to anesthesia    sister--- ponv  . Fibromyalgia   . GERD (gastroesophageal reflux disease)   . Goiter   .  Gross hematuria   . Headache(784.0)    tension headache daily per pt   . Heart murmur   . Hiatal hernia   . History of vertebral compression fracture   . Hypertension   . Hypothyroidism    endocrinologist-- dr Chalmers Cater  . Left adrenal mass ([place])   . Left renal mass   . Left thyroid nodule    hx biopsy 2013, benign  . Macular degeneration    both  left > right  . Osteoarthritis   . Osteoporosis   . Peripheral vascular disease ([place])    varicose veins in left arm   . 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 PONV (postoperative nausea and vomiting)   . Renal cell cancer, left [facility])    oncologist-- dr Alen Blew  . TMJ syndrome    right   . UTI (urinary tract infection) 02/28/2018  . Wears glasses     Patient Active Problem List   Diagnosis Date Noted  . Mild dehydration   . Severe protein-calorie malnutrition Altame[employer] Oiler: less than 60% of standard weight) ([place])   . Palliative care by specialist   . DNR (do  not resuscitate)   . Failure to thrive (0-17)   . Tachycardia   . Metastatic urothelial carcinoma ([place])   . FTT (failure to thrive) in adult 05/02/2018  . Acute encephalopathy 04/13/2018  . Mild renal insufficiency 04/13/2018  . LFTs abnormal 04/13/2018  . Hypertensive urgency 04/13/2018  . Protein-calorie malnutrition, severe 03/12/2018  . Renal mass 03/09/2018  . Coronary artery calcification 02/14/2018  . Near syncope 02/02/2018  . Orthostatic hypotension 02/02/2018  . Anemia 02/02/2018  . Suprapubic discomfort 07/06/2017  . Vitamin D deficiency 08/19/2015  . Closed right hip fracture ([place]) 05/30/2015  . Chest pain syndrome 05/30/2015  . Fall at home 05/30/2015  . Closed right femoral fracture ([place]) 05/30/2015  . Displaced fracture of right femoral neck ([place])  05/30/2015  . Fall   . Cough 07/02/2013  . Other dyspnea and respiratory abnormality 07/02/2013  . History of colon cancer, stage III 07/02/2013  . Chronic cholecystitis with calculus s/p lap chole 11/17/2012 11/19/2012  . Hyponatremia 11/16/2012  . GERD (gastroesophageal reflux disease) 11/16/2012  . Choledocholithiasis with obstruction 11/16/2012  . Calcification of aorta (HCC) 11/16/2012  . Atherosclerosis 11/16/2012  . Hypothyroidism 11/15/2012  . Nonspecific (abnormal) findings on radiological and other examination of gastrointestinal tract 11/08/2012  . S/P right knee replacement 12/22/2010  . Goals of care, counseling/discussion 12/08/2010  . Essential hypertension 12/08/2010  . Hyperlipidemia 12/08/2010    Past Surgical History:  Procedure Laterality Date  . APPENDECTOMY  age 22  . CATARACT EXTRACTION W/ INTRAOCULAR LENS  IMPLANT, BILATERAL  2004  approx.  . CHOLECYSTECTOMY N/A 11/17/2012   Procedure: LAPAROSCOPIC CHOLECYSTECTOMY;  Surgeon: Leighton Ruff, MD;  Location: [facility];  Service: General;  Laterality: N/A;  . COLONOSCOPY W/ BIOPSIES  09/25/2002, 09/18/2001  . ERCP N/A 11/14/2012   Procedure: ENDOSCOPIC RETROGRADE CHOLANGIOPANCREATOGRAPHY (ERCP);  Surgeon: Jeryl Columbia, MD;  Location: [facility];  Service: Endoscopy;  Laterality: N/A;  . ESOPHAGOGASTRODUODENOSCOPY ENDOSCOPY    . KYPHOPLASTY  01/2011  . LAPAROSCOPIC RIGHT HEMI COLECTOMY  10-03-2001    dr Hassell Done @[facility]   . [place]  2000  . porta catheter placement     REMOVAL 2005  . ROBOT ASSISTED LAPAROSCOPIC NEPHRECTOMY Left 03/09/2018   Procedure: XI ROBOTIC ASSISTED LAPAROSCOPIC NEPHRECTOMY;  Surgeon: Cleon Gustin, MD;  Location: [facility];  Service: Urology;  Laterality: Left;  3 HRS  . TEMPOROMANDIBULAR JOINT SURGERY Right 1990s  . TONSILLECTOMY  child  . TOTAL HIP ARTHROPLASTY Right 05/30/2015   Procedure: TOTAL HIP ARTHROPLASTY ANTERIOR APPROACH;  Surgeon: Rod Can, MD;  Location: [facility];   Service: Orthopedics;  Laterality: Right;  . TOTAL KNEE ARTHROPLASTY  12/20/2010   Procedure: TOTAL KNEE ARTHROPLASTY;  Surgeon: Mauri Pole;  Location: [facility];  Service: Orthopedics;  Laterality: Right;  . TOTAL KNEE ARTHROPLASTY Right 12-20-2010  dr Alvan Dame @[facility]   . UPPER GASTROINTESTINAL ENDOSCOPY  09/18/2001     OB History    Gravida  1   Para  1   Term  1   Preterm  0   AB  0   Living  1     SAB  0   TAB  0   Ectopic  0   Multiple  0   Live Births  1            Home Medications    Prior to Admission medications   Medication Sig Start Date End Date Taking? Authorizing Provider  acetaminophen (TYLENOL) 325 MG tablet Take 650 mg  by mouth 2 (two) times daily.    [provider]  amLODipine (NORVASC) 10 MG tablet Take 1 tablet (10 mg total) by mouth every morning. 05/04/18   Barton Dubois, MD  aspirin EC 81 MG tablet Take 81 mg by mouth daily.    [provider]  Cholecalciferol (VITAMIN D) 50 MCG (2000 UT) CAPS Take 1 capsule by mouth every morning.    [provider]  docusate sodium (COLACE) 100 MG capsule Take 100 mg by mouth every morning.     [provider]  dronabinol (MARINOL) 2.5 MG capsule Take 2.5 mg by mouth 2 (two) times daily before a meal.    [provider]  estradiol (ESTRACE) 0.5 MG tablet Take 0.5 mg by mouth every morning.  05/16/16   [provider]  feeding supplement, ENSURE ENLIVE, (ENSURE ENLIVE) LIQD Take 237 mLs by mouth 2 (two) times daily between meals. 05/04/18   Barton Dubois, MD  ferrous sulfate 325 (65 FE) MG tablet Take 325 mg by mouth 2 (two) times daily.    [provider]  levothyroxine (SYNTHROID, LEVOTHROID) 200 MCG tablet Take 200 mcg by mouth daily before breakfast.    [provider]  medroxyPROGESTERone (PROVERA) 5 MG tablet Take 2.5 mg by mouth every morning.     [provider]  metoprolol succinate (TOPROL-XL) 50 MG 24 hr tablet Take 50 mg by  mouth every morning.     [provider]  Multiple Vitamins-Minerals (CENTRUM SILVER ULTRA WOMENS PO) Take 1 tablet by mouth every evening.     [provider]  Multiple Vitamins-Minerals (PRESERVISION AREDS PO) Take 2 capsules by mouth daily.     [provider]  nortriptyline (PAMELOR) 50 MG capsule Take 50 mg by mouth at bedtime.     [provider]  omeprazole (PRILOSEC) 20 MG capsule Take 20 mg by mouth daily.    [provider]  Polyethyl Glycol-Propyl Glycol (SYSTANE ULTRA OP) Place 2 drops into both eyes at bedtime.    [provider]  polyethylene glycol (MIRALAX / GLYCOLAX) 17 g packet Take 17 g by mouth daily as needed for mild constipation. 05/04/18   Barton Dubois, MD  vitamin B-12 (CYANOCOBALAMIN) 1000 MCG tablet Take 1,000 mcg by mouth daily.    [provider]  vitamin C (ASCORBIC ACID) 500 MG tablet Take 500 mg by mouth daily.     [provider]  Vitamin D, Ergocalciferol, (DRISDOL) 50000 units CAPS capsule take ONE CAPSULE BY MOUTH every 7 DAYS Patient taking differently: Take 50,000 Units by mouth every Monday.  08/19/15   Kem Boroughs, FNP    Family History Family History  Problem Relation Age of Onset  . Heart disease Mother        Enlarged heart  . Arrhythmia Mother   . Diabetes Mother   . Heart attack Mother   . Heart disease Brother        MI at unknown age  . Anesthesia problems Brother   . Heart disease Father   . Heart attack Father   . Cancer Son        non-hodgkins lymphoma  . Stroke Sister   . Heart attack Other        MI at age 14  . Heart disease Brother        CABG  . AAA (abdominal aortic aneurysm) Brother   . CAD Brother     Social History Social History   Tobacco Use  .  Smoking status: Never Smoker  . Smokeless tobacco: Never Used  Substance Use Topics  . Alcohol use: No  . Drug use: No     Allergies   Tape; Latex; Lidocaine; and Sulfa antibiotics   Review  of Systems Review of Systems  Constitutional: Negative for fever.  HENT: Negative for sore throat.   Eyes: Negative for visual disturbance.  Respiratory: Negative for cough and shortness of breath.   Cardiovascular: Positive for chest pain.  Gastrointestinal: Negative for abdominal pain, nausea and vomiting.  Genitourinary: Negative for dysuria.  Musculoskeletal: Negative for neck pain.  Skin: Negative for rash.  Neurological: Negative for headaches.     Physical Exam Updated Vital Signs BP 126/81   Pulse (!) 103   Temp 98.1 F (36.7 C) (Oral)   Resp (!) 22   Ht 5\' 5"  (1.651 m)   Wt 48.4 kg   LMP 01/17/1994   SpO2 99%   BMI 17.76 kg/m   Physical Exam Vitals signs and nursing note reviewed.  Constitutional:      General: She is not in acute distress.    Appearance: She is well-developed.  HENT:     Head: Normocephalic and atraumatic.  Eyes:     Conjunctiva/sclera: Conjunctivae normal.  Neck:     Musculoskeletal: Neck supple.  Cardiovascular:     Rate and Rhythm: Regular rhythm. Tachycardia present.     Heart sounds: Normal heart sounds. No murmur.  Pulmonary:     Effort: Pulmonary effort is normal. No respiratory distress.     Breath sounds: Normal breath sounds.  Abdominal:     Palpations: Abdomen is soft.     Tenderness: There is no abdominal tenderness.  Musculoskeletal: Normal range of motion.     Right lower leg: She exhibits no tenderness. No edema.     Left lower leg: She exhibits no tenderness. No edema.  Skin:    General: Skin is warm and dry.     Capillary Refill: Capillary refill takes less than 2 seconds.  Neurological:     General: No focal deficit present.     Mental Status: She is alert. Mental status is at baseline. She is disoriented.      ED Treatments / Results  Labs (all labs ordered are listed, but only abnormal results are displayed) Labs Reviewed  BASIC METABOLIC PANEL - Abnormal; Notable for the following components:       Result Value   Chloride 97 (*)    Glucose, Bld 284 (*)    BUN 26 (*)    Creatinine, Ser 1.15 (*)    GFR calc non Af Amer 44 (*)    GFR calc Af Amer 51 (*)    All other components within normal limits  CBC - Abnormal; Notable for the following components:   WBC 14.8 (*)    RDW 15.6 (*)    Platelets 552 (*)    All other components within normal limits  TROPONIN I - Abnormal; Notable for the following components:   Troponin I 0.04 (*)    All other components within normal limits  TROPONIN I - Abnormal; Notable for the following components:   Troponin I 0.04 (*)    All other components within normal limits    EKG EKG Interpretation  Date/Time:  Friday May 18 2018 09:18:57 EDT Ventricular Rate:  104 PR Interval:    QRS Duration: 98 QT Interval:  340 QTC Calculation: 448 R Axis:   40 Text Interpretation:  Sinus tachycardia Abnormal  R-wave progression, early transition similar to prior 4/20 Confirmed by Aletta Edouard [phone]) on 05/18/2018 9:26:35 AM   Radiology Dg Chest Port 1 View  Result Date: 05/18/2018 CLINICAL DATA:  Chest pain. EXAM: PORTABLE CHEST 1 VIEW COMPARISON:  05/02/2018 FINDINGS: There is a mildly displaced posterior left seventh rib fracture that may be new. Negative for a pneumothorax. No focal lung disease or overt pulmonary edema. Heart and mediastinum are within normal limits. Again noted is bone cement in a lower thoracic vertebral body compatible with an augmentation procedure. Atherosclerotic calcifications at the aortic arch. IMPRESSION: Probable new left seventh rib fracture.  Negative for pneumothorax. Electronically Signed   By: Markus Daft M.D.   On: 05/18/2018 09:55    Procedures Procedures (including critical care time)  Medications Ordered in ED Medications - No data to display   Initial Impression / Assessment and Plan / ED Course  I have reviewed the triage vital signs and the nursing notes.  Pertinent labs & imaging results that were  available during my care of the patient were reviewed by me and considered in my medical decision making (see chart for details).  Clinical Course as of May 17 1741  Fri May 18, 2018  1200 Patient with history of dementia here after complaining of left-sided chest pain during physical therapy.  Initial troponin is slightly elevated at 0.04.  She is pain-free now and is a very poor historian.  Differential diagnosis includes ACS, musculoskeletal pain, GERD, pneumothorax.  As her troponin is elevated and she had recently had the pain she needs a delta troponin.  Her chest x-ray did comment upon a left seventh rib fracture that was not seen prior.  This is potentially from her fall a few weeks ago.  Possible cause of her pain.  No reported recent falls.   [MB]    Clinical Course User Index [MB] Hayden Rasmussen, MD       Final Clinical Impressions(s) / ED Diagnoses   Final diagnoses:  Nonspecific chest pain  Closed fracture of one rib of left side, initial encounter    ED Discharge Orders    None       Hayden Rasmussen, MD 05/18/18 1743

## 2018-05-18 NOTE — Telephone Encounter (Signed)
Received a message from patient son [name] asking if the patient MD appt on 5/28 can be a phone conversation with him because it is diff to transport the patient from the facility and she has dementia. He is also not allowed to go into the rehab facility at this time due to [place] restrictions. Made [name] aware that [name] will contact him on 5/28 and the patient appt will be cancelled. [name] stated that he will make sure the facility knows not to transport the patient on 5/28.

## 2018-05-18 NOTE — ED Triage Notes (Addendum)
Pt BIB [facility] from [facility]. Per EMS patient developed some chest pain today during physical therapy. Staff at the facility also noticed some shortness of breath and some cyanosis to the patient's left fingers. Given 324 of aspirin prior to EMS arrival on scene. Per EMS patient was given 100 of NSS and denied any current chest pain for them. Pt is normally confused at baseline. Pt alert and oriented to person and place only at present time. EMS unable to obtain an SpO2 on patient, so patient placed on 2L via [place] by EMS.  EMS Vitals of 135/89, 95bpm, CBG of 329

## 2018-05-24 ENCOUNTER — Emergency Department (HOSPITAL_COMMUNITY): Payer: Medicare Other

## 2018-05-24 ENCOUNTER — Other Ambulatory Visit: Payer: Self-pay

## 2018-05-24 ENCOUNTER — Encounter (HOSPITAL_COMMUNITY): Payer: Self-pay | Admitting: Emergency Medicine

## 2018-05-24 ENCOUNTER — Inpatient Hospital Stay (HOSPITAL_COMMUNITY)
Admission: EM | Admit: 2018-05-24 | Discharge: 2018-06-18 | DRG: 193 | Disposition: E | Payer: Medicare Other | Attending: Internal Medicine | Admitting: Internal Medicine

## 2018-05-24 DIAGNOSIS — E870 Hyperosmolality and hypernatremia: Secondary | ICD-10-CM | POA: Diagnosis present

## 2018-05-24 DIAGNOSIS — Z91040 Latex allergy status: Secondary | ICD-10-CM

## 2018-05-24 DIAGNOSIS — R945 Abnormal results of liver function studies: Secondary | ICD-10-CM | POA: Diagnosis present

## 2018-05-24 DIAGNOSIS — C652 Malignant neoplasm of left renal pelvis: Secondary | ICD-10-CM | POA: Diagnosis present

## 2018-05-24 DIAGNOSIS — Z9842 Cataract extraction status, left eye: Secondary | ICD-10-CM

## 2018-05-24 DIAGNOSIS — Z905 Acquired absence of kidney: Secondary | ICD-10-CM

## 2018-05-24 DIAGNOSIS — I10 Essential (primary) hypertension: Secondary | ICD-10-CM | POA: Diagnosis present

## 2018-05-24 DIAGNOSIS — N179 Acute kidney failure, unspecified: Secondary | ICD-10-CM | POA: Diagnosis present

## 2018-05-24 DIAGNOSIS — Y95 Nosocomial condition: Secondary | ICD-10-CM | POA: Diagnosis present

## 2018-05-24 DIAGNOSIS — M797 Fibromyalgia: Secondary | ICD-10-CM | POA: Diagnosis present

## 2018-05-24 DIAGNOSIS — Z66 Do not resuscitate: Secondary | ICD-10-CM | POA: Diagnosis present

## 2018-05-24 DIAGNOSIS — Z9049 Acquired absence of other specified parts of digestive tract: Secondary | ICD-10-CM

## 2018-05-24 DIAGNOSIS — C787 Secondary malignant neoplasm of liver and intrahepatic bile duct: Secondary | ICD-10-CM | POA: Diagnosis present

## 2018-05-24 DIAGNOSIS — Z9109 Other allergy status, other than to drugs and biological substances: Secondary | ICD-10-CM

## 2018-05-24 DIAGNOSIS — J189 Pneumonia, unspecified organism: Principal | ICD-10-CM

## 2018-05-24 DIAGNOSIS — F419 Anxiety disorder, unspecified: Secondary | ICD-10-CM | POA: Diagnosis present

## 2018-05-24 DIAGNOSIS — E878 Other disorders of electrolyte and fluid balance, not elsewhere classified: Secondary | ICD-10-CM | POA: Diagnosis present

## 2018-05-24 DIAGNOSIS — Z884 Allergy status to anesthetic agent status: Secondary | ICD-10-CM

## 2018-05-24 DIAGNOSIS — K7689 Other specified diseases of liver: Secondary | ICD-10-CM | POA: Diagnosis not present

## 2018-05-24 DIAGNOSIS — E1151 Type 2 diabetes mellitus with diabetic peripheral angiopathy without gangrene: Secondary | ICD-10-CM | POA: Diagnosis present

## 2018-05-24 DIAGNOSIS — Z515 Encounter for palliative care: Secondary | ICD-10-CM

## 2018-05-24 DIAGNOSIS — Z789 Other specified health status: Secondary | ICD-10-CM | POA: Diagnosis not present

## 2018-05-24 DIAGNOSIS — J9690 Respiratory failure, unspecified, unspecified whether with hypoxia or hypercapnia: Secondary | ICD-10-CM | POA: Diagnosis present

## 2018-05-24 DIAGNOSIS — R627 Adult failure to thrive: Secondary | ICD-10-CM | POA: Diagnosis present

## 2018-05-24 DIAGNOSIS — H3530 Unspecified macular degeneration: Secondary | ICD-10-CM | POA: Diagnosis present

## 2018-05-24 DIAGNOSIS — R000 Tachycardia, unspecified: Secondary | ICD-10-CM | POA: Diagnosis present

## 2018-05-24 DIAGNOSIS — Z8249 Family history of ischemic heart disease and other diseases of the circulatory system: Secondary | ICD-10-CM

## 2018-05-24 DIAGNOSIS — Z9181 History of falling: Secondary | ICD-10-CM

## 2018-05-24 DIAGNOSIS — Z79899 Other long term (current) drug therapy: Secondary | ICD-10-CM

## 2018-05-24 DIAGNOSIS — C7910 Secondary malignant neoplasm of unspecified urinary organs: Secondary | ICD-10-CM | POA: Diagnosis not present

## 2018-05-24 DIAGNOSIS — F0390 Unspecified dementia without behavioral disturbance: Secondary | ICD-10-CM | POA: Diagnosis present

## 2018-05-24 DIAGNOSIS — Z681 Body mass index (BMI) 19 or less, adult: Secondary | ICD-10-CM

## 2018-05-24 DIAGNOSIS — R64 Cachexia: Secondary | ICD-10-CM | POA: Diagnosis present

## 2018-05-24 DIAGNOSIS — Z961 Presence of intraocular lens: Secondary | ICD-10-CM | POA: Diagnosis present

## 2018-05-24 DIAGNOSIS — C7972 Secondary malignant neoplasm of left adrenal gland: Secondary | ICD-10-CM | POA: Diagnosis not present

## 2018-05-24 DIAGNOSIS — I2510 Atherosclerotic heart disease of native coronary artery without angina pectoris: Secondary | ICD-10-CM | POA: Diagnosis present

## 2018-05-24 DIAGNOSIS — M810 Age-related osteoporosis without current pathological fracture: Secondary | ICD-10-CM | POA: Diagnosis present

## 2018-05-24 DIAGNOSIS — Z85038 Personal history of other malignant neoplasm of large intestine: Secondary | ICD-10-CM

## 2018-05-24 DIAGNOSIS — R4182 Altered mental status, unspecified: Secondary | ICD-10-CM

## 2018-05-24 DIAGNOSIS — Z9841 Cataract extraction status, right eye: Secondary | ICD-10-CM

## 2018-05-24 DIAGNOSIS — Z833 Family history of diabetes mellitus: Secondary | ICD-10-CM

## 2018-05-24 DIAGNOSIS — K6389 Other specified diseases of intestine: Secondary | ICD-10-CM | POA: Diagnosis not present

## 2018-05-24 DIAGNOSIS — E039 Hypothyroidism, unspecified: Secondary | ICD-10-CM | POA: Diagnosis present

## 2018-05-24 DIAGNOSIS — Z1159 Encounter for screening for other viral diseases: Secondary | ICD-10-CM

## 2018-05-24 DIAGNOSIS — K219 Gastro-esophageal reflux disease without esophagitis: Secondary | ICD-10-CM | POA: Diagnosis present

## 2018-05-24 DIAGNOSIS — Z7982 Long term (current) use of aspirin: Secondary | ICD-10-CM

## 2018-05-24 DIAGNOSIS — M1990 Unspecified osteoarthritis, unspecified site: Secondary | ICD-10-CM | POA: Diagnosis present

## 2018-05-24 DIAGNOSIS — E860 Dehydration: Secondary | ICD-10-CM | POA: Diagnosis present

## 2018-05-24 DIAGNOSIS — K5900 Constipation, unspecified: Secondary | ICD-10-CM | POA: Diagnosis present

## 2018-05-24 DIAGNOSIS — Z9089 Acquired absence of other organs: Secondary | ICD-10-CM | POA: Diagnosis not present

## 2018-05-24 DIAGNOSIS — E896 Postprocedural adrenocortical (-medullary) hypofunction: Secondary | ICD-10-CM | POA: Diagnosis not present

## 2018-05-24 DIAGNOSIS — Z96651 Presence of right artificial knee joint: Secondary | ICD-10-CM | POA: Diagnosis present

## 2018-05-24 DIAGNOSIS — Z96641 Presence of right artificial hip joint: Secondary | ICD-10-CM | POA: Diagnosis present

## 2018-05-24 DIAGNOSIS — Z87440 Personal history of urinary (tract) infections: Secondary | ICD-10-CM

## 2018-05-24 DIAGNOSIS — K668 Other specified disorders of peritoneum: Secondary | ICD-10-CM | POA: Diagnosis not present

## 2018-05-24 DIAGNOSIS — Z882 Allergy status to sulfonamides status: Secondary | ICD-10-CM

## 2018-05-24 DIAGNOSIS — Z79890 Hormone replacement therapy (postmenopausal): Secondary | ICD-10-CM

## 2018-05-24 LAB — URINALYSIS, ROUTINE W REFLEX MICROSCOPIC
Bacteria, UA: NONE SEEN
Bilirubin Urine: NEGATIVE
Glucose, UA: NEGATIVE mg/dL
Hgb urine dipstick: NEGATIVE
Ketones, ur: 5 mg/dL — AB
Leukocytes,Ua: NEGATIVE
Nitrite: NEGATIVE
Protein, ur: 30 mg/dL — AB
Specific Gravity, Urine: 1.025 (ref 1.005–1.030)
pH: 5 (ref 5.0–8.0)

## 2018-05-24 LAB — COMPREHENSIVE METABOLIC PANEL
ALT: 55 U/L — ABNORMAL HIGH (ref 0–44)
AST: 49 U/L — ABNORMAL HIGH (ref 15–41)
Albumin: 2.6 g/dL — ABNORMAL LOW (ref 3.5–5.0)
Alkaline Phosphatase: 1698 U/L — ABNORMAL HIGH (ref 38–126)
Anion gap: 12 (ref 5–15)
BUN: 36 mg/dL — ABNORMAL HIGH (ref 8–23)
CO2: 26 mmol/L (ref 22–32)
Calcium: 9.5 mg/dL (ref 8.9–10.3)
Chloride: 104 mmol/L (ref 98–111)
Creatinine, Ser: 1.46 mg/dL — ABNORMAL HIGH (ref 0.44–1.00)
GFR calc Af Amer: 38 mL/min — ABNORMAL LOW (ref >60)
GFR calc non Af Amer: 33 mL/min — ABNORMAL LOW (ref >60)
Glucose, Bld: 170 mg/dL — ABNORMAL HIGH (ref 70–99)
Potassium: 4 mmol/L (ref 3.5–5.1)
Sodium: 142 mmol/L (ref 135–145)
Total Bilirubin: 0.9 mg/dL (ref 0.3–1.2)
Total Protein: 7.6 g/dL (ref 6.5–8.1)

## 2018-05-24 LAB — CBC WITH DIFFERENTIAL/PLATELET
Abs Immature Granulocytes: 0.13 10*3/uL — ABNORMAL HIGH (ref 0.00–0.07)
Basophils Absolute: 0 10*3/uL (ref 0.0–0.1)
Basophils Relative: 0 %
Eosinophils Absolute: 0 10*3/uL (ref 0.0–0.5)
Eosinophils Relative: 0 %
HCT: 37.5 % (ref 36.0–46.0)
Hemoglobin: 11.6 g/dL — ABNORMAL LOW (ref 12.0–15.0)
Immature Granulocytes: 1 %
Lymphocytes Relative: 5 %
Lymphs Abs: 0.9 10*3/uL (ref 0.7–4.0)
MCH: 31.4 pg (ref 26.0–34.0)
MCHC: 30.9 g/dL (ref 30.0–36.0)
MCV: 101.6 fL — ABNORMAL HIGH (ref 80.0–100.0)
Monocytes Absolute: 1.5 10*3/uL — ABNORMAL HIGH (ref 0.1–1.0)
Monocytes Relative: 8 %
Neutro Abs: 15.7 10*3/uL — ABNORMAL HIGH (ref 1.7–7.7)
Neutrophils Relative %: 86 %
Platelets: 559 10*3/uL — ABNORMAL HIGH (ref 150–400)
RBC: 3.69 MIL/uL — ABNORMAL LOW (ref 3.87–5.11)
RDW: 16.1 % — ABNORMAL HIGH (ref 11.5–15.5)
WBC: 18.3 10*3/uL — ABNORMAL HIGH (ref 4.0–10.5)
nRBC: 0 % (ref 0.0–0.2)

## 2018-05-24 LAB — LACTIC ACID, PLASMA
Lactic Acid, Venous: 1.5 mmol/L (ref 0.5–1.9)
Lactic Acid, Venous: 1.5 mmol/L (ref 0.5–1.9)

## 2018-05-24 LAB — POC OCCULT BLOOD, ED: Fecal Occult Bld: POSITIVE — AB

## 2018-05-24 LAB — AMMONIA: Ammonia: 29 umol/L (ref 9–35)

## 2018-05-24 LAB — SARS CORONAVIRUS 2 BY RT PCR (HOSPITAL ORDER, PERFORMED IN ~~LOC~~ HOSPITAL LAB): SARS Coronavirus 2: NEGATIVE

## 2018-05-24 MED ORDER — SODIUM CHLORIDE 0.9 % IV SOLN
INTRAVENOUS | Status: DC
  Administered 2018-05-24 (×2): via INTRAVENOUS

## 2018-05-24 MED ORDER — SORBITOL 70 % SOLN
960.0000 mL | TOPICAL_OIL | Freq: Once | ORAL | Status: AC
  Administered 2018-05-24: 960 mL via RECTAL
  Filled 2018-05-24: qty 473

## 2018-05-24 MED ORDER — ENOXAPARIN SODIUM 30 MG/0.3ML ~~LOC~~ SOLN
30.0000 mg | SUBCUTANEOUS | Status: DC
  Administered 2018-05-24: 22:00:00 30 mg via SUBCUTANEOUS
  Filled 2018-05-24: qty 0.3

## 2018-05-24 MED ORDER — SODIUM CHLORIDE 0.9 % IV SOLN
2.0000 g | Freq: Once | INTRAVENOUS | Status: AC
  Administered 2018-05-24: 15:00:00 2 g via INTRAVENOUS
  Filled 2018-05-24: qty 2

## 2018-05-24 MED ORDER — HYDROMORPHONE HCL 1 MG/ML IJ SOLN: 0.5000 mg | INTRAMUSCULAR | Status: DC | PRN

## 2018-05-24 MED ORDER — IOHEXOL 300 MG/ML  SOLN
75.0000 mL | Freq: Once | INTRAMUSCULAR | Status: AC | PRN
  Administered 2018-05-24: 14:00:00 75 mL via INTRAVENOUS

## 2018-05-24 MED ORDER — SODIUM CHLORIDE (PF) 0.9 % IJ SOLN
INTRAMUSCULAR | Status: AC
  Filled 2018-05-24: qty 50

## 2018-05-24 MED ORDER — SODIUM CHLORIDE 0.9 % IV BOLUS
500.0000 mL | Freq: Once | INTRAVENOUS | Status: AC
  Administered 2018-05-24: 500 mL via INTRAVENOUS

## 2018-05-24 MED ORDER — VANCOMYCIN HCL IN DEXTROSE 1-5 GM/200ML-% IV SOLN
1000.0000 mg | Freq: Once | INTRAVENOUS | Status: AC
  Administered 2018-05-24: 1000 mg via INTRAVENOUS
  Filled 2018-05-24: qty 200

## 2018-05-24 MED ORDER — BISACODYL 10 MG RE SUPP
10.0000 mg | Freq: Every day | RECTAL | Status: DC
  Administered 2018-05-25 – 2018-05-27 (×2): 10 mg via RECTAL
  Filled 2018-05-24 (×2): qty 1

## 2018-05-24 MED ORDER — SODIUM CHLORIDE 0.9 % IV SOLN
2.0000 g | Freq: Two times a day (BID) | INTRAVENOUS | Status: DC
  Administered 2018-05-24 – 2018-05-25 (×2): 2 g via INTRAVENOUS
  Filled 2018-05-24 (×3): qty 2

## 2018-05-24 NOTE — ED Notes (Signed)
Patient transported to CT 

## 2018-05-24 NOTE — H&P (Signed)
History and Physical    [name]:[id number] DOB: 10/13/1932 DOA: 06/11/2018  PCP: [name], MD Patient coming from: Nursing home  Chief Complaint: Change in mental status  HPI: [name] is a 85 y.o. 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  Upon discussion with the son he reported that she slept day and night at home.  She was sent from the nursing home today with complaints of change in mental status and increasing confusion and less responsive with decreased p.o. intake.  No documented fever chills cough nausea vomiting diarrhea or constipation.  History is obtained mainly from the records and patient's son and the ER physician.  Patient unable to provide any history she only responds to pain.  She has history of recurrent falls.   ED Course: Patient received vancomycin and cefepime.  CT of the abdomen and pelvis with contrast showsInterval left nephrectomy and adrenalectomy. There is now a large, irregular, peripherally-enhancing soft tissue process in the left retroperitoneal space, tracking cranio caudally along the psoas muscle, anteriorly up anterior to the aorta and posteriorly behind the spleen and the down into the lower left paracolic gutter. Imaging features are most suggestive of markedly advanced local recurrence of neoplasm. Although dissecting abscess is possible, this is felt to be less likely based on imaging. There is apparent involvement of the distal descending colon and inferior spleen. Lateral extent towards the tail of pancreas obliterates the splenic vein.  Interval development of multiple hepatic lesions consistent with  metastatic disease. A central right liver lesion obstructs righthepatic bile ducts. 1.4 cm metastatic lesion in the omentum of the splenic flexure with another 1.4 cm nodule adjacent to the descending colon.  Stable appearance of the calcified irregular central mesenteric lesion of the right paramidline abdomen. Prominent stool volume distal colon and rectum.  Aortic Atherosclerois (ICD10  Review of Systems: As per HPI otherwise all other systems reviewed and are negative  Ambulatory Status: She is bedbound and wheelchair-bound at baseline.  Past Medical History:  Diagnosis Date  . Abdominal pain   . Anemia   . Calcification of aorta (HCC) 11/16/2012  . Coronary artery calcification 02/14/2018   cardiologist-- dr t. Oval Linsey  . Depression   . Diabetes mellitus type 2, diet-controlled ([place])    pt denies  . DJD (degenerative joint disease)   . DM II (diabetes mellitus, type II), controlled 11/16/2012  . Dysuria   . Epistaxis   . Family history of adverse reaction to anesthesia    sister--- ponv  . Fibromyalgia   . GERD (gastroesophageal reflux disease)   . Goiter   . Gross hematuria   . Headache(784.0)    tension headache daily per pt   . Heart murmur   . Hiatal hernia   . History of vertebral compression fracture   . Hypertension   . Hypothyroidism    endocrinologist-- dr Chalmers Cater  . Left adrenal mass ([place])   . Left renal mass   . Left thyroid nodule    hx biopsy 2013, benign  . Macular degeneration    both  left > right  . Osteoarthritis   . Osteoporosis   . Peripheral vascular disease ([place])  varicose veins in left arm   . 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 PONV (postoperative nausea and vomiting)   . Renal cell cancer, left [facility])    oncologist-- dr  Alen Blew  . TMJ syndrome    right   . UTI (urinary tract infection) 02/28/2018  . Wears glasses     Past Surgical History:  Procedure Laterality Date  . APPENDECTOMY  age 63  . CATARACT EXTRACTION W/ INTRAOCULAR LENS  IMPLANT, BILATERAL  2004  approx.  . CHOLECYSTECTOMY N/A 11/17/2012   Procedure: LAPAROSCOPIC CHOLECYSTECTOMY;  Surgeon: Leighton Ruff, MD;  Location: [facility];  Service: General;  Laterality: N/A;  . COLONOSCOPY W/ BIOPSIES  09/25/2002, 09/18/2001  . ERCP N/A 11/14/2012   Procedure: ENDOSCOPIC RETROGRADE CHOLANGIOPANCREATOGRAPHY (ERCP);  Surgeon: Jeryl Columbia, MD;  Location: [facility];  Service: Endoscopy;  Laterality: N/A;  . ESOPHAGOGASTRODUODENOSCOPY ENDOSCOPY    . KYPHOPLASTY  01/2011  . LAPAROSCOPIC RIGHT HEMI COLECTOMY  10-03-2001    dr Hassell Done @[facility]   . [place]  2000  . porta catheter placement     REMOVAL 2005  . ROBOT ASSISTED LAPAROSCOPIC NEPHRECTOMY Left 03/09/2018   Procedure: XI ROBOTIC ASSISTED LAPAROSCOPIC NEPHRECTOMY;  Surgeon: Cleon Gustin, MD;  Location: [facility];  Service: Urology;  Laterality: Left;  3 HRS  . TEMPOROMANDIBULAR JOINT SURGERY Right 1990s  . TONSILLECTOMY  child  . TOTAL HIP ARTHROPLASTY Right 05/30/2015   Procedure: TOTAL HIP ARTHROPLASTY ANTERIOR APPROACH;  Surgeon: Rod Can, MD;  Location: [facility];  Service: Orthopedics;  Laterality: Right;  . TOTAL KNEE ARTHROPLASTY  12/20/2010   Procedure: TOTAL KNEE ARTHROPLASTY;  Surgeon: Mauri Pole;  Location: [facility];  Service: Orthopedics;  Laterality: Right;  . TOTAL KNEE ARTHROPLASTY Right 12-20-2010  dr Alvan Dame @[facility]   . UPPER GASTROINTESTINAL ENDOSCOPY  09/18/2001    Social History   Socioeconomic History  . Marital status: Single    Spouse name: Not on file  . Number of children: 1  . Years of education: Not on file  . Highest education level: Not on file  Occupational History    Comment: retired  Scientific laboratory technician  . Financial resource strain: Not on file  . Food insecurity:     Worry: Not on file    Inability: Not on file  . Transportation needs:    Medical: Not on file    Non-medical: Not on file  Tobacco Use  . Smoking status: Never Smoker  . Smokeless tobacco: Never Used  Substance and Sexual Activity  . Alcohol use: No  . Drug use: No  . Sexual activity: Never    Birth control/protection: Post-menopausal  Lifestyle  . Physical activity:    Days per week: Not on file    Minutes per session: Not on file  . Stress: Not on file  Relationships  . Social connections:    Talks on phone: Not on file    Gets together: Not on file    Attends religious service: Not on file    Active member of club or organization: Not on file    Attends meetings of clubs or organizations: Not on file    Relationship status: Not on file  . Intimate partner violence:    Fear of current or ex  partner: Not on file    Emotionally abused: Not on file    Physically abused: Not on file    Forced sexual activity: Not on file  Other Topics Concern  . Not on file  Social History Narrative  . Not on file    Allergies  Allergen Reactions  . Tape Rash    Blisters  . Latex Rash and Other (See Comments)    Blisters   . Lidocaine Nausea And Vomiting  . Sulfa Antibiotics Nausea And Vomiting    Family History  Problem Relation Age of Onset  . Heart disease Mother        Enlarged heart  . Arrhythmia Mother   . Diabetes Mother   . Heart attack Mother   . Heart disease Brother        MI at unknown age  . Anesthesia problems Brother   . Heart disease Father   . Heart attack Father   . Cancer Son        non-hodgkins lymphoma  . Stroke Sister   . Heart attack Other        MI at age 11  . Heart disease Brother        CABG  . AAA (abdominal aortic aneurysm) Brother   . CAD Brother       Prior to Admission medications   Medication Sig Start Date End Date Taking? Authorizing Provider  acetaminophen (TYLENOL) 325 MG tablet Take 650 mg by mouth every 8 (eight) hours.     Yes [provider]  amLODipine (NORVASC) 10 MG tablet Take 1 tablet (10 mg total) by mouth every morning. 05/04/18  Yes Barton Dubois, MD  aspirin EC 81 MG tablet Take 81 mg by mouth daily.   Yes [provider]  Baclofen 5 MG TABS Take 5 mg by mouth 2 (two) times daily as needed (muscle spasms).   Yes [provider]  cefTRIAXone (ROCEPHIN) 1 g injection Inject 1 g into the muscle daily.   Yes [provider]  docusate sodium (COLACE) 100 MG capsule Take 200 mg by mouth every morning.    Yes [provider]  ferrous sulfate 325 (65 FE) MG tablet Take 325 mg by mouth 2 (two) times daily.   Yes [provider]  levothyroxine (SYNTHROID, LEVOTHROID) 200 MCG tablet Take 200 mcg by mouth daily before breakfast.   Yes [provider]  Menthol, Topical Analgesic, (BIOFREEZE) 4 % GEL Apply 1 application topically 3 (three) times daily.   Yes [provider]  metoprolol succinate (TOPROL-XL) 50 MG 24 hr tablet Take 50 mg by mouth every morning.    Yes [provider]  mirtazapine (REMERON) 15 MG tablet Take 15 mg by mouth at bedtime.   Yes [provider]  Multiple Vitamins-Minerals (CENTRUM SILVER ULTRA WOMENS PO) Take 1 tablet by mouth every evening.    Yes [provider]  Multiple Vitamins-Minerals (PRESERVISION AREDS PO) Take 2 capsules by mouth daily.    Yes [provider]  nortriptyline (PAMELOR) 50 MG capsule Take 50 mg by mouth at bedtime.    Yes [provider]  omeprazole (PRILOSEC) 20 MG capsule Take 20 mg by mouth daily.   Yes [provider]  Polyethyl Glycol-Propyl Glycol (SYSTANE ULTRA OP) Place 2 drops into both eyes at bedtime.   Yes [provider]  polyethylene glycol (MIRALAX / GLYCOLAX) 17 g packet Take 17 g by mouth daily as needed for mild constipation. 05/04/18  Yes Barton Dubois, MD  vitamin B-12 (CYANOCOBALAMIN) 1000 MCG tablet Take 1,000 mcg by  mouth daily.   Yes [provider]  vitamin C (ASCORBIC ACID) 500 MG tablet Take 500 mg by mouth daily.    Yes [provider]  Vitamin D, Ergocalciferol, (DRISDOL) 50000 units CAPS capsule take ONE CAPSULE BY MOUTH every 7 DAYS Patient taking differently: Take 50,000 Units by mouth every Monday.  08/19/15  Yes Kem Boroughs, FNP  feeding supplement, ENSURE ENLIVE, (ENSURE ENLIVE) LIQD Take 237 mLs by mouth 2 (two) times daily between meals. Patient not taking: Reported on 06/02/2018 05/04/18   Barton Dubois, MD    Physical Exam: Patient is moaning and groaning does not respond to any commands but responds to pain Vitals:   06/04/2018 1033 05/25/2018 1034 06/01/2018 1035 06/02/2018 1311  BP:  133/80  (!) 145/79  Pulse:  (!) 106  (!) 104  Resp:  (!) 28  18  Temp:  97.8 F (36.6 C)    TempSrc:  Rectal    SpO2: 100% 100%  100%  Weight:   48.4 kg   Height:   5\' 5"  (1.651 m)      . General: Cachectic frail elderly female in moderate distress due to pain  . eyes:  PERRL, EOMI, normal lids, iris . ENT: grossly normal hearing, lips & tongue, mucous membranes dry . Neck:  no LAD, masses or thyromegaly . Cardiovascular:  RRR, no m/r/g. No LE edema.  Marland Kitchen Respiratory:  CTA bilaterally, no w/r/r. Normal respiratory effort. . Abdomen:  soft, generalized tenderness decreased bowel sounds . Skin: no rash or induration seen on limited exam . Musculoskeletal: grossly normal tone BUE/BLE, good ROM, no bony abnormality . Psychiatric: grossly normal mood and affect, speech fluent and appropriate, AOx3 Labs on Admission: I have personally reviewed following labs and imaging studies  CBC: Recent Labs  Lab 05/18/18 0934 05/19/2018 1112  WBC 14.8* 18.3*  NEUTROABS  --  15.7*  HGB 12.4 11.6*  HCT 39.4 37.5  MCV 98.3 101.6*  PLT 552* 825*   Basic Metabolic Panel: Recent Labs  Lab 05/18/18 0934 05/28/2018 1112  NA 136 142  K 4.8 4.0  CL 97* 104  CO2 28 26  GLUCOSE 284* 170*  BUN 26*  36*  CREATININE 1.15* 1.46*  CALCIUM 9.2 9.5   GFR: Estimated Creatinine Clearance: 22.3 mL/min (A) (by C-G formula based on SCr of 1.46 mg/dL (H)). Liver Function Tests: Recent Labs  Lab 05/25/2018 1112  AST 49*  ALT 55*  ALKPHOS 1,698*  BILITOT 0.9  PROT 7.6  ALBUMIN 2.6*   No results for input(s): LIPASE, AMYLASE in the last 168 hours. Recent Labs  Lab 06/06/2018 1117  AMMONIA 29   Coagulation Profile: No results for input(s): INR, PROTIME in the last 168 hours. Cardiac Enzymes: Recent Labs  Lab 05/18/18 0934 05/18/18 1253  TROPONINI 0.04* 0.04*   BNP (last 3 results) No results for input(s): PROBNP in the last 8760 hours. HbA1C: No results for input(s): HGBA1C in the last 72 hours. CBG: No results for input(s): GLUCAP in the last 168 hours. Lipid Profile: No results for input(s): CHOL, HDL, LDLCALC, TRIG, CHOLHDL, LDLDIRECT in the last 72 hours. Thyroid Function Tests: No results for input(s): TSH, T4TOTAL, FREET4, T3FREE, THYROIDAB in the last 72 hours. Anemia Panel: No results for input(s): VITAMINB12, FOLATE, FERRITIN, TIBC, IRON, RETICCTPCT in the last 72 hours. Urine analysis:    Component Value Date/Time   COLORURINE AMBER (A) 05/31/2018  [place] 06/10/2018 1304   LABSPEC 1.025 05/23/2018 1304   PHURINE 5.0 06/09/2018 1304   GLUCOSEU NEGATIVE 05/20/2018 1304   HGBUR NEGATIVE 06/02/2018 1304   BILIRUBINUR NEGATIVE 05/22/2018 1304   BILIRUBINUR neg 07/29/2013 0952   KETONESUR 5 (A) 05/25/2018 1304   PROTEINUR 30 (A) 06/02/2018 1304   UROBILINOGEN negative 07/29/2013 0952   UROBILINOGEN 0.2 12/24/2010 0729   NITRITE NEGATIVE 06/07/2018 1304   LEUKOCYTESUR NEGATIVE 05/31/2018 1304    Creatinine Clearance: Estimated Creatinine Clearance: 22.3 mL/min (A) (by C-G formula based on SCr of 1.46 mg/dL (H)).  Sepsis Labs: @LABRCNTIP (procalcitonin:4,lacticidven:4) )No results found for this or any previous visit (from the past 240  hour(s)).   Radiological Exams on Admission: Dg Chest 2 View  Result Date: 06/02/2018 CLINICAL DATA:  Weakness. EXAM: CHEST - 2 VIEW COMPARISON:  05/18/2018 FINDINGS: Patchy opacity noted right lung base. Interstitial markings are diffusely coarsened with chronic features. Stable asymmetric elevation right hemidiaphragm. The cardiopericardial silhouette is within normal limits for size. The visualized bony structures of the thorax are intact. Status post lower thoracic vertebral augmentation. Telemetry leads overlie the chest. IMPRESSION: Underlying chronic interstitial changes with hazy opacity at the right lung base, suspicious for pneumonia. Electronically Signed   By: Misty Stanley M.D.   On: 06/17/2018 12:50   Ct Head Wo Contrast  Result Date: 06/01/2018 CLINICAL DATA:  Altered level of consciousness EXAM: CT HEAD WITHOUT CONTRAST TECHNIQUE: Contiguous axial images were obtained from the base of the skull through the vertex without intravenous contrast. COMPARISON:  05/02/2018 FINDINGS: Motion degraded images. Brain: No evidence of acute infarction, hemorrhage, hydrocephalus, extra-axial collection or mass lesion/mass effect. Subcortical white matter and periventricular small vessel ischemic changes. Vascular: Mild intracranial atherosclerosis. Skull: Normal. Negative for fracture or focal lesion. Sinuses/Orbits: The visualized paranasal sinuses are essentially clear. The mastoid air cells are unopacified. Other: None. IMPRESSION: Motion degraded images. No evidence of acute intracranial abnormality. Small vessel ischemic changes. Electronically Signed   By: Julian Hy M.D.   On: 06/16/2018 12:22     Assessment/Plan Active Problems:   * No active hospital problems. *   #1 acute change in mental status multifactorial secondary to dehydration, pain, possible left lower lobe pneumonia.  Chest x-ray shows right lung base opacity consistent with pneumonia.  I will continue cefepime.  Check MRSA  PCR.  Patient received Vanco and cefepime in the ER.  Follow-up blood cultures.  Patient admitted with decreased p.o. intake and decreased responsiveness more confused than her baseline.  Patient had multiple hospital admissions since January 2020 for major surgery including left nephrectomy recurrent falls and dehydration.  Patient is not awake enough to take anything by mouth I will start her on IV fluids.  Lactic acid 1.5 white count 18.3.  UA shows ketones negative for leukocytes and nitrites.  COVID was not done due to low suspicion.  #2 high-grade urothelial carcinoma status post left nephrectomy with possible liver mets February 2020 patient was discharged to [facility].  Plan was to evaluate for chemotherapy versus immunotherapy versus palliative care to improve her quality of life.  Discussed with son regarding palliative care consult.  He agrees to palliative care consult.  But he reports his mother told him never to put her on hospice.  [name] is her oncologist.  Consult palliative care.  Patient has no quality of life and is a candidate for hospice.  [name] aware of patient admission and will see the patient tomorrow.  #  3 failure to thrive with recurrent hospital admissions for dehydration and decreased p.o. intake.  Dietary consult.  #4 tachycardia secondary to pain and dehydration most likely.  Will order tramadol.  #5 hypertension hydralazine PRN IV.  #6 dehydration with dry mucous membrane change in mental status continue IV fluids.  #7 AKI continue IV fluids  #8 abnormal LFTs highly likely secondary to liver mets  #9 constipation CT scan shows heavy stool burden will give her suppository since she is not awake enough to take medications by mouth.  #10 FOBT positive in the ER hemoglobin stable history of colon cancer and metastatic high-grade urothelial cancer   Estimated body mass index is 17.76 kg/m as calculated from the following:   Height as of this encounter: 5'  5" (1.651 m).   Weight as of this encounter: 48.4 kg.   DVT prophylaxis: Lovenox Code Status: DO NOT RESUSCITATE Family Communication discussed with son Allison Quarry at [phone] Disposition Plan: Pending clinical improvement Consults called: Palliative care Admission status: Inpatient   Georgette Shell MD [facility]  If 7PM-7AM, please contact night-coverage [email or web address] Password [email or web address]  06/15/2018, 3:00 PM

## 2018-05-24 NOTE — ED Triage Notes (Signed)
Patient arrived by EMS from [facility]. Pt has AMS. [facility] reported that patient is different than baseline per EMS. PT hasn't been eating and drinking per [employer].   EMS gave 350 mL of NS.   Pt has DNR at bedside.   EMS VS BP 129/89, HR 110, RR 16, CBG 242, T 98.3 F. EMS unable to get SpO2.

## 2018-05-24 NOTE — ED Notes (Signed)
Bed: DI97 Expected date:  Expected time:  Means of arrival:  Comments: EMS-AMS

## 2018-05-24 NOTE — Progress Notes (Signed)
A consult was received from an ED physician for vancomycin and cefepime per pharmacy dosing.  The patient's profile has been reviewed for ht/wt/allergies/indication/available labs.   A one time order has been placed for vancomycin 1 gm and cefepime 2 gm.    Further antibiotics/pharmacy consults should be ordered by admitting physician if indicated.                       Thank you, Eudelia Bunch, Pharm.D 05/23/2018 2:17 PM

## 2018-05-24 NOTE — ED Provider Notes (Signed)
[facility] DEPT Provider Note   CSN: [id number] Arrival date & time: 06/10/2018  1007    History   Chief Complaint Chief Complaint  Patient presents with   Altered Mental Status    HPI [name] is a 85 y.o. female.     HPI History and information was obtained from the previous chart, the son and the nursing facility.  Patient is unable to give any history as she is laying in bed with her eyes closed moaning.  Patient apparently has had decreased oral intake over the last 2 days and increasing confusion and altered mental status.  She does have a history of dementia.  The son states that she has not been doing well since she had a kidney removed.  He states he is concerned that the cancer may be causing some further issues for the patient.  Patient has not had any vomiting or fever at the nursing facility. Past Medical History:  Diagnosis Date   Abdominal pain    Anemia    Calcification of aorta ([place]) 11/16/2012   Coronary artery calcification 02/14/2018   cardiologist-- dr t. Oval Linsey   Depression    Diabetes mellitus type 2, diet-controlled ([place])    pt denies   DJD (degenerative joint disease)    DM II (diabetes mellitus, type II), controlled 11/16/2012   Dysuria    Epistaxis    Family history of adverse reaction to anesthesia    sister--- ponv   Fibromyalgia    GERD (gastroesophageal reflux disease)    Goiter    Gross hematuria    Headache(784.0)    tension headache daily per pt    Heart murmur    Hiatal hernia    History of vertebral compression fracture    Hypertension    Hypothyroidism    endocrinologist-- dr balan   Left adrenal mass [facility])    Left renal mass    Left thyroid nodule    hx biopsy 2013, benign   Macular degeneration    both  left > right   Osteoarthritis    Osteoporosis    Peripheral vascular disease ([place])    varicose veins in left arm    Personal history of colon cancer,  stage III oncologist-- dr Alvy Bimler (note in epic no recurrence)   2003---  s/p  right hemicolectomy 10-03-2001  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;  and completed chemo 04-2002  (Stage IIIB, pT3 N1 M0)   PONV (postoperative nausea and vomiting)    Renal cell cancer, left [facility])    oncologist-- dr Alen Blew   TMJ syndrome    right    UTI (urinary tract infection) 02/28/2018   Wears glasses     Patient Active Problem List   Diagnosis Date Noted   Mild dehydration    Severe protein-calorie malnutrition [employer]: less than 60% of standard weight) ([place])    Palliative care by specialist    DNR (do not resuscitate)    Failure to thrive (0-17)    Tachycardia    Metastatic urothelial carcinoma (HCC)    FTT (failure to thrive) in adult 05/02/2018   Acute encephalopathy 04/13/2018   Mild renal insufficiency 04/13/2018   LFTs abnormal 04/13/2018   Hypertensive urgency 04/13/2018   Protein-calorie malnutrition, severe 03/12/2018   Renal mass 03/09/2018   Coronary artery calcification 02/14/2018   Near syncope 02/02/2018   Orthostatic hypotension 02/02/2018   Anemia 02/02/2018   Suprapubic discomfort 07/06/2017   Vitamin D deficiency  08/19/2015   Closed right hip fracture ([place]) 05/30/2015   Chest pain syndrome 05/30/2015   Fall at home 05/30/2015   Closed right femoral fracture ([place]) 05/30/2015   Displaced fracture of right femoral neck ([place]) 05/30/2015   Fall    Cough 07/02/2013   Other dyspnea and respiratory abnormality 07/02/2013   History of colon cancer, stage III 07/02/2013   Chronic cholecystitis with calculus s/p lap chole 11/17/2012 11/19/2012   Hyponatremia 11/16/2012   GERD (gastroesophageal reflux disease) 11/16/2012   Choledocholithiasis with obstruction 11/16/2012   Calcification of aorta ([place]) 11/16/2012   Atherosclerosis 11/16/2012   Hypothyroidism 11/15/2012   Nonspecific (abnormal) findings on radiological and  other examination of gastrointestinal tract 11/08/2012   S/P right knee replacement 12/22/2010   Goals of care, counseling/discussion 12/08/2010   Essential hypertension 12/08/2010   Hyperlipidemia 12/08/2010    Past Surgical History:  Procedure Laterality Date   APPENDECTOMY  age 89   CATARACT EXTRACTION W/ [place], BILATERAL  2004  approx.   CHOLECYSTECTOMY N/A 11/17/2012   Procedure: LAPAROSCOPIC CHOLECYSTECTOMY;  Surgeon: Leighton Ruff, MD;  Location: [facility] ORS;  Service: General;  Laterality: N/A;   COLONOSCOPY W/ BIOPSIES  09/25/2002, 09/18/2001   ERCP N/A 11/14/2012   Procedure: ENDOSCOPIC RETROGRADE CHOLANGIOPANCREATOGRAPHY (ERCP);  Surgeon: Jeryl Columbia, MD;  Location: [facility];  Service: Endoscopy;  Laterality: N/A;   ESOPHAGOGASTRODUODENOSCOPY ENDOSCOPY     KYPHOPLASTY  01/2011   LAPAROSCOPIC RIGHT HEMI COLECTOMY  10-03-2001    dr Hassell Done @[facility]    LUMBAR SPINE SURGERY  2000   porta catheter placement     REMOVAL 2005   ROBOT ASSISTED LAPAROSCOPIC NEPHRECTOMY Left 03/09/2018   Procedure: XI ROBOTIC ASSISTED LAPAROSCOPIC NEPHRECTOMY;  Surgeon: Cleon Gustin, MD;  Location: [facility];  Service: Urology;  Laterality: Left;  3 HRS   TEMPOROMANDIBULAR JOINT SURGERY Right 1990s   TONSILLECTOMY  child   TOTAL HIP ARTHROPLASTY Right 05/30/2015   Procedure: TOTAL HIP ARTHROPLASTY ANTERIOR APPROACH;  Surgeon: Rod Can, MD;  Location: [facility];  Service: Orthopedics;  Laterality: Right;   TOTAL KNEE ARTHROPLASTY  12/20/2010   Procedure: TOTAL KNEE ARTHROPLASTY;  Surgeon: Mauri Pole;  Location: [facility];  Service: Orthopedics;  Laterality: Right;   TOTAL KNEE ARTHROPLASTY Right 12-20-2010  dr Alvan Dame @[facility]    UPPER GASTROINTESTINAL ENDOSCOPY  09/18/2001     OB History    Gravida  1   Para  1   Term  1   Preterm  0   AB  0   Living  1     SAB  0   TAB  0   Ectopic  0   Multiple  0   Live Births  1            Home Medications     Prior to Admission medications   Medication Sig Start Date End Date Taking? Authorizing Provider  acetaminophen (TYLENOL) 325 MG tablet Take 650 mg by mouth every 8 (eight) hours.    Yes [provider]  amLODipine (NORVASC) 10 MG tablet Take 1 tablet (10 mg total) by mouth every morning. 05/04/18  Yes Barton Dubois, MD  aspirin EC 81 MG tablet Take 81 mg by mouth daily.   Yes [provider]  Baclofen 5 MG TABS Take 5 mg by mouth 2 (two) times daily as needed (muscle spasms).   Yes [provider]  cefTRIAXone (ROCEPHIN) 1 g injection Inject 1 g into the muscle daily.  Yes [provider]  docusate sodium (COLACE) 100 MG capsule Take 200 mg by mouth every morning.    Yes [provider]  ferrous sulfate 325 (65 FE) MG tablet Take 325 mg by mouth 2 (two) times daily.   Yes [provider]  levothyroxine (SYNTHROID, LEVOTHROID) 200 MCG tablet Take 200 mcg by mouth daily before breakfast.   Yes [provider]  Menthol, Topical Analgesic, (BIOFREEZE) 4 % GEL Apply 1 application topically 3 (three) times daily.   Yes [provider]  metoprolol succinate (TOPROL-XL) 50 MG 24 hr tablet Take 50 mg by mouth every morning.    Yes [provider]  mirtazapine (REMERON) 15 MG tablet Take 15 mg by mouth at bedtime.   Yes [provider]  Multiple Vitamins-Minerals (CENTRUM SILVER ULTRA WOMENS PO) Take 1 tablet by mouth every evening.    Yes [provider]  Multiple Vitamins-Minerals (PRESERVISION AREDS PO) Take 2 capsules by mouth daily.    Yes [provider]  nortriptyline (PAMELOR) 50 MG capsule Take 50 mg by mouth at bedtime.    Yes [provider]  omeprazole (PRILOSEC) 20 MG capsule Take 20 mg by mouth daily.   Yes [provider]  Polyethyl Glycol-Propyl Glycol (SYSTANE ULTRA OP) Place 2 drops into both eyes at bedtime.   Yes [provider]  polyethylene glycol  (MIRALAX / GLYCOLAX) 17 g packet Take 17 g by mouth daily as needed for mild constipation. 05/04/18  Yes Barton Dubois, MD  vitamin B-12 (CYANOCOBALAMIN) 1000 MCG tablet Take 1,000 mcg by mouth daily.   Yes [provider]  vitamin C (ASCORBIC ACID) 500 MG tablet Take 500 mg by mouth daily.    Yes [provider]  Vitamin D, Ergocalciferol, (DRISDOL) 50000 units CAPS capsule take ONE CAPSULE BY MOUTH every 7 DAYS Patient taking differently: Take 50,000 Units by mouth every Monday.  08/19/15  Yes Kem Boroughs, FNP  feeding supplement, ENSURE ENLIVE, (ENSURE ENLIVE) LIQD Take 237 mLs by mouth 2 (two) times daily between meals. Patient not taking: Reported on 05/21/2018 05/04/18   Barton Dubois, MD    Family History Family History  Problem Relation Age of Onset   Heart disease Mother        Enlarged heart   Arrhythmia Mother    Diabetes Mother    Heart attack Mother    Heart disease Brother        MI at unknown age   Anesthesia problems Brother    Heart disease Father    Heart attack Father    Cancer Son        non-hodgkins lymphoma   Stroke Sister    Heart attack Other        MI at age 58   Heart disease Brother        CABG   AAA (abdominal aortic aneurysm) Brother    CAD Brother     Social History Social History   Tobacco Use   Smoking status: Never Smoker   Smokeless tobacco: Never Used  Substance Use Topics   Alcohol use: No   Drug use: No     Allergies   Tape; Latex; Lidocaine; and Sulfa antibiotics   Review of Systems Review of Systems Level 5 caveat applies due to altered mental status.  Physical Exam Updated Vital Signs BP (!) 145/79 (BP Location: Left Arm)    Pulse (!) 104    Temp 97.8 F (36.6 C) (Rectal)  Resp 18    Ht 5\' 5"  (1.651 m)    Wt 48.4 kg    LMP 01/17/1994    SpO2 100%    BMI 17.76 kg/m   Physical Exam Vitals signs and nursing note reviewed.  Constitutional:      General: She is sleeping. She is not  in acute distress.    Appearance: She is well-developed.  HENT:     Head: Normocephalic and atraumatic.  Eyes:     Pupils: Pupils are equal, round, and reactive to light.  Neck:     Musculoskeletal: Normal range of motion and neck supple.  Cardiovascular:     Rate and Rhythm: Normal rate and regular rhythm.     Heart sounds: Normal heart sounds. No murmur. No friction rub. No gallop.   Pulmonary:     Effort: Pulmonary effort is normal. No respiratory distress.     Breath sounds: Normal breath sounds. No wheezing.  Abdominal:     General: Bowel sounds are normal. There is no distension.     Palpations: Abdomen is soft.     Tenderness: There is no abdominal tenderness.  Skin:    General: Skin is warm and dry.     Capillary Refill: Capillary refill takes less than 2 seconds.     Findings: No erythema or rash.  Neurological:     Motor: No abnormal muscle tone.     Coordination: Coordination normal.  Psychiatric:        Behavior: Behavior normal.      ED Treatments / Results  Labs (all labs ordered are listed, but only abnormal results are displayed) Labs Reviewed  COMPREHENSIVE METABOLIC PANEL - Abnormal; Notable for the following components:      Result Value   Glucose, Bld 170 (*)    BUN 36 (*)    Creatinine, Ser 1.46 (*)    Albumin 2.6 (*)    AST 49 (*)    ALT 55 (*)    Alkaline Phosphatase 1,698 (*)    GFR calc non Af Amer 33 (*)    GFR calc Af Amer 38 (*)    All other components within normal limits  CBC WITH DIFFERENTIAL/PLATELET - Abnormal; Notable for the following components:   WBC 18.3 (*)    RBC 3.69 (*)    Hemoglobin 11.6 (*)    MCV 101.6 (*)    RDW 16.1 (*)    Platelets 559 (*)    Neutro Abs 15.7 (*)    Monocytes Absolute 1.5 (*)    Abs Immature Granulocytes 0.13 (*)    All other components within normal limits  URINALYSIS, ROUTINE W REFLEX MICROSCOPIC - Abnormal; Notable for the following components:   Color, Urine AMBER (*)    Ketones, ur 5 (*)      Protein, ur 30 (*)    All other components within normal limits  POC OCCULT BLOOD, ED - Abnormal; Notable for the following components:   Fecal Occult Bld POSITIVE (*)    All other components within normal limits  URINE CULTURE  SARS CORONAVIRUS 2 (HOSPITAL ORDER, [facility])  CULTURE, BLOOD (ROUTINE X 2)  CULTURE, BLOOD (ROUTINE X 2)  AMMONIA  LACTIC ACID, PLASMA  LACTIC ACID, PLASMA    EKG EKG Interpretation  Date/Time:  Thursday May 24 2018 13:09:02 EDT Ventricular Rate:  105 PR Interval:    QRS Duration: 93 QT Interval:  331 QTC Calculation: 438 R Axis:   27 Text Interpretation:  Sinus tachycardia Ventricular premature complex Borderline repolarization abnormality Confirmed by Dene Gentry [phone]) on 05/27/2018 1:25:59 PM   Radiology Dg Chest 2 View  Result Date: 05/27/2018 CLINICAL DATA:  Weakness. EXAM: CHEST - 2 VIEW COMPARISON:  05/18/2018 FINDINGS: Patchy opacity noted right lung base. Interstitial markings are diffusely coarsened with chronic features. Stable asymmetric elevation right hemidiaphragm. The cardiopericardial silhouette is within normal limits for size. The visualized bony structures of the thorax are intact. Status post lower thoracic vertebral augmentation. Telemetry leads overlie the chest. IMPRESSION: Underlying chronic interstitial changes with hazy opacity at the right lung base, suspicious for pneumonia. Electronically Signed   By: Misty Stanley M.D.   On: 05/18/2018 12:50   Ct Head Wo Contrast  Result Date: 06/12/2018 CLINICAL DATA:  Altered level of consciousness EXAM: CT HEAD WITHOUT CONTRAST TECHNIQUE: Contiguous axial images were obtained from the base of the skull through the vertex without intravenous contrast. COMPARISON:  05/02/2018 FINDINGS: Motion degraded images. Brain: No evidence of acute infarction, hemorrhage, hydrocephalus, extra-axial collection or mass lesion/mass effect. Subcortical white matter and  periventricular small vessel ischemic changes. Vascular: Mild intracranial atherosclerosis. Skull: Normal. Negative for fracture or focal lesion. Sinuses/Orbits: The visualized paranasal sinuses are essentially clear. The mastoid air cells are unopacified. Other: None. IMPRESSION: Motion degraded images. No evidence of acute intracranial abnormality. Small vessel ischemic changes. Electronically Signed   By: Julian Hy M.D.   On: 06/01/2018 12:22   Ct Abdomen Pelvis W Contrast  Result Date: 06/14/2018 CLINICAL DATA:  Abdominal pain with nausea vomiting. EXAM: CT ABDOMEN AND PELVIS WITH CONTRAST TECHNIQUE: Multidetector CT imaging of the abdomen and pelvis was performed using the standard protocol following bolus administration of intravenous contrast. CONTRAST:  66mL OMNIPAQUE IOHEXOL 300 MG/ML  SOLN COMPARISON:  To 12/07/2018 FINDINGS: Lower chest: Coronary artery calcification is evident. Emphysema noted in the lung bases. Stable 9 mm ground-glass nodule posterior right lower lobe. Hepatobiliary: New hypervascular 10 mm lesion in the dome of liver (11/2) liver parenchyma heterogeneous. 3.4 cm rim enhancing lesion in the left liver (16/7) is new in the interval. 2.2 cm lesion in the central right liver (23/2). Areas of irregular subcapsular enhancement are seen scattered in the right and left hepatic lobes. Gallbladder surgically absent. No extrahepatic biliary duct dilatation although pneumobilia suggests prior sphincterotomy. Obstructs the right intrahepatic bile ducts resulting in new dilatation. Pancreas: Diffuse pancreatic atrophy. No dilatation of the main duct. Spleen: Similar appearance 7 mm low-density lesion inferior spleen. Adrenals/Urinary Tract: Right adrenal gland and kidney unremarkable. Patient status post interval left left nephrectomy and adrenalectomy. Plaque-like area of irregular peripherally enhancing soft tissues noted in the left retroperitoneal space, measuring 10 cm in  craniocaudal length by 8 cm in AP dimension by 2.7 cm in coronal diameter. This tracks caudally down along the left psoas muscle towards the pelvis. Lateral extent of this soft tissue towards the tail of pancreas appears to occlude the splenic vein. Bladder is partially obscured by artifact from right hip replacement. Stomach/Bowel: Stomach is unremarkable. No gastric wall thickening. No evidence of outlet obstruction. Duodenum is normally positioned as is the ligament of Treitz. No small bowel wall thickening. No small bowel dilatation. Colon is nondilated. Prominent stool volume in the sigmoid colon and rectum. Vascular/Lymphatic: There is abdominal aortic atherosclerosis without aneurysm. No pelvic sidewall lymphadenopathy. Reproductive: The uterus is surgically absent. There is no adnexal mass. Other: Calcified irregular right mesenteric lesion is similar to prior. As mentioned above, there is plaque-like irregular peripherally enhancing  soft tissue in the left nephrectomy bed tracks cranio caudally along the psoas muscle. Anteriorly this tracks up along the left side in the anterior to the aorta. Posteriorly it tracks around behind the spleen in the down caudally into the left paracolic gutter (image 82/9) where it may involve the medial wall of the distal descending colon (38/2). As noted above, the soft tissue extends laterally towards the tail of pancreas and appears to obstruct the splenic vein. 14 mm omental nodule identified in the splenic flexure on 31/2. 14 mm nodule seen anterior to the descending colon on 55/2 Musculoskeletal: Status post right hip replacement. Age indeterminate fracture of the posterior left eighth rib with possible inferior left scapula fracture noted on image 1 of series 2. IMPRESSION: 1. Interval left nephrectomy and adrenalectomy. There is now a large, irregular, peripherally-enhancing soft tissue process in the left retroperitoneal space, tracking cranio caudally along the psoas  muscle, anteriorly up anterior to the aorta and posteriorly behind the spleen and the down into the lower left paracolic gutter. Imaging features are most suggestive of markedly advanced local recurrence of neoplasm. Although dissecting abscess is possible, this is felt to be less likely based on imaging. There is apparent involvement of the distal descending colon and inferior spleen. Lateral extent towards the tail of pancreas obliterates the splenic vein. 2. Interval development of multiple hepatic lesions consistent with metastatic disease. A central right liver lesion obstructs right hepatic bile ducts. 3. 1.4 cm metastatic lesion in the omentum of the splenic flexure with another 1.4 cm nodule adjacent to the descending colon. 4. Stable appearance of the calcified irregular central mesenteric lesion of the right paramidline abdomen. 5. Prominent stool volume distal colon and rectum. 6.  Aortic Atherosclerois (ICD10-170.0) Electronically Signed   By: Misty Stanley M.D.   On: 05/21/2018 15:03    Procedures Procedures (including critical care time)  Medications Ordered in ED Medications  sodium chloride (PF) 0.9 % injection (has no administration in time range)  vancomycin (VANCOCIN) IVPB 1000 mg/200 mL premix (1,000 mg Intravenous New Bag/Given 05/21/2018 1443)  ceFEPIme (MAXIPIME) 2 g in sodium chloride 0.9 % 100 mL IVPB (2 g Intravenous New Bag/Given 05/23/2018 1444)  sodium chloride 0.9 % bolus 500 mL (0 mLs Intravenous Stopped 06/01/2018 1404)  iohexol (OMNIPAQUE) 300 MG/ML solution 75 mL (75 mLs Intravenous Contrast Given 06/07/2018 1415)     Initial Impression / Assessment and Plan / ED Course  I have reviewed the triage vital signs and the nursing notes.  Pertinent labs & imaging results that were available during my care of the patient were reviewed by me and considered in my medical decision making (see chart for details).       Patient has several issues noted on her laboratory testing that  could be contributing to her symptoms.  She also has a pneumonia which we will treat with antibiotics.  Patient to be admitted to the hospital for further evaluation and care.  Spoke with the [facility] hospitalist will admit the patient.  Patient was given IV fluids as well.  Final Clinical Impressions(s) / ED Diagnoses   Final diagnoses:  Altered mental status, unspecified altered mental status type    ED Discharge Orders    None       Dalia Heading, PA-C 06/04/2018 1516    Valarie Merino, MD 05/25/18 [phone]

## 2018-05-24 NOTE — Progress Notes (Signed)
Pharmacy Antibiotic Note  [name] is a 85 y.o. female admitted on 06/02/2018 with altered mental status.  Pharmacy has been consulted for cefepime dosing for HCAP  Plan: Cefepime 2 gm IV 12 for nCrCl 33 ml/min   Height: 5\' 5"  (165.1 cm) Weight: 106 lb 11.2 oz (48.4 kg) IBW/kg (Calculated) : 57  Temp (24hrs), Avg:97.8 F (36.6 C), Min:97.8 F (36.6 C), Max:97.8 F (36.6 C)  Recent Labs  Lab 05/18/18 0934 06/13/2018 1112 05/19/2018 1117 06/11/2018 1451  WBC 14.8* 18.3*  --   --   CREATININE 1.15* 1.46*  --   --   LATICACIDVEN  --   --  1.5 1.5    Estimated Creatinine Clearance: 22.3 mL/min (A) (by C-G formula based on SCr of 1.46 mg/dL (H)).    Allergies  Allergen Reactions  . Tape Rash    Blisters  . Latex Rash and Other (See Comments)    Blisters   . Lidocaine Nausea And Vomiting  . Sulfa Antibiotics Nausea And Vomiting    Antimicrobials this admission: 5/7 Cefepime>> 5/7 Vanc x 1  Dose adjustments this admission:  Microbiology results: 5/7 BCx2>>sent 5/7 Ucx>sent   Thank you for allowing pharmacy to be a part of this patient's care.  Eudelia Bunch, Pharm.D 06/01/2018 3:42 PM

## 2018-05-24 NOTE — ED Notes (Signed)
System not crossing information over  Occult Blood test was Positive +

## 2018-05-24 NOTE — ED Notes (Signed)
Patients son contact number is [phone] Allison Quarry).

## 2018-05-25 DIAGNOSIS — Z9089 Acquired absence of other organs: Secondary | ICD-10-CM

## 2018-05-25 DIAGNOSIS — E860 Dehydration: Secondary | ICD-10-CM

## 2018-05-25 DIAGNOSIS — K668 Other specified disorders of peritoneum: Secondary | ICD-10-CM

## 2018-05-25 DIAGNOSIS — C652 Malignant neoplasm of left renal pelvis: Secondary | ICD-10-CM

## 2018-05-25 DIAGNOSIS — R64 Cachexia: Secondary | ICD-10-CM

## 2018-05-25 DIAGNOSIS — Z66 Do not resuscitate: Secondary | ICD-10-CM

## 2018-05-25 DIAGNOSIS — C7972 Secondary malignant neoplasm of left adrenal gland: Secondary | ICD-10-CM

## 2018-05-25 DIAGNOSIS — R4182 Altered mental status, unspecified: Secondary | ICD-10-CM

## 2018-05-25 DIAGNOSIS — E896 Postprocedural adrenocortical (-medullary) hypofunction: Secondary | ICD-10-CM

## 2018-05-25 DIAGNOSIS — N179 Acute kidney failure, unspecified: Secondary | ICD-10-CM

## 2018-05-25 DIAGNOSIS — Z905 Acquired absence of kidney: Secondary | ICD-10-CM

## 2018-05-25 DIAGNOSIS — J189 Pneumonia, unspecified organism: Principal | ICD-10-CM

## 2018-05-25 DIAGNOSIS — K6389 Other specified diseases of intestine: Secondary | ICD-10-CM

## 2018-05-25 DIAGNOSIS — K7689 Other specified diseases of liver: Secondary | ICD-10-CM

## 2018-05-25 DIAGNOSIS — Z789 Other specified health status: Secondary | ICD-10-CM

## 2018-05-25 LAB — CBC
HCT: 39.2 % (ref 36.0–46.0)
Hemoglobin: 11.6 g/dL — ABNORMAL LOW (ref 12.0–15.0)
MCH: 30.2 pg (ref 26.0–34.0)
MCHC: 29.6 g/dL — ABNORMAL LOW (ref 30.0–36.0)
MCV: 102.1 fL — ABNORMAL HIGH (ref 80.0–100.0)
Platelets: 503 10*3/uL — ABNORMAL HIGH (ref 150–400)
RBC: 3.84 MIL/uL — ABNORMAL LOW (ref 3.87–5.11)
RDW: 16.3 % — ABNORMAL HIGH (ref 11.5–15.5)
WBC: 18.9 10*3/uL — ABNORMAL HIGH (ref 4.0–10.5)
nRBC: 0 % (ref 0.0–0.2)

## 2018-05-25 LAB — COMPREHENSIVE METABOLIC PANEL
ALT: 47 U/L — ABNORMAL HIGH (ref 0–44)
AST: 39 U/L (ref 15–41)
Albumin: 2.3 g/dL — ABNORMAL LOW (ref 3.5–5.0)
Alkaline Phosphatase: 1332 U/L — ABNORMAL HIGH (ref 38–126)
Anion gap: 12 (ref 5–15)
BUN: 23 mg/dL (ref 8–23)
CO2: 22 mmol/L (ref 22–32)
Calcium: 9 mg/dL (ref 8.9–10.3)
Chloride: 112 mmol/L — ABNORMAL HIGH (ref 98–111)
Creatinine, Ser: 0.96 mg/dL (ref 0.44–1.00)
GFR calc Af Amer: >60 mL/min (ref >60)
GFR calc non Af Amer: 55 mL/min — ABNORMAL LOW (ref >60)
Glucose, Bld: 159 mg/dL — ABNORMAL HIGH (ref 70–99)
Potassium: 3.7 mmol/L (ref 3.5–5.1)
Sodium: 146 mmol/L — ABNORMAL HIGH (ref 135–145)
Total Bilirubin: 1.1 mg/dL (ref 0.3–1.2)
Total Protein: 6.6 g/dL (ref 6.5–8.1)

## 2018-05-25 LAB — URINE CULTURE: Culture: NO GROWTH

## 2018-05-25 MED ORDER — LEVALBUTEROL HCL 0.63 MG/3ML IN NEBU: 0.6300 mg | INHALATION_SOLUTION | Freq: Four times a day (QID) | RESPIRATORY_TRACT | Status: DC | PRN

## 2018-05-25 MED ORDER — GLYCOPYRROLATE 0.2 MG/ML IJ SOLN
0.2000 mg | INTRAMUSCULAR | Status: DC | PRN
  Administered 2018-05-28: 0.2 mg via INTRAVENOUS
  Filled 2018-05-25 (×2): qty 1

## 2018-05-25 MED ORDER — GUAIFENESIN ER 600 MG PO TB12: 1200.0000 mg | ORAL_TABLET | Freq: Two times a day (BID) | ORAL | Status: DC

## 2018-05-25 MED ORDER — LORAZEPAM 2 MG/ML IJ SOLN: 0.5000 mg | INTRAMUSCULAR | Status: DC | PRN

## 2018-05-25 MED ORDER — ONDANSETRON HCL 4 MG/2ML IJ SOLN: 4.0000 mg | Freq: Four times a day (QID) | INTRAMUSCULAR | Status: DC | PRN

## 2018-05-25 MED ORDER — POLYVINYL ALCOHOL 1.4 % OP SOLN
1.0000 [drp] | Freq: Four times a day (QID) | OPHTHALMIC | Status: DC | PRN
  Filled 2018-05-25: qty 15

## 2018-05-25 MED ORDER — IPRATROPIUM BROMIDE 0.02 % IN SOLN
0.5000 mg | Freq: Four times a day (QID) | RESPIRATORY_TRACT | Status: DC
  Administered 2018-05-25: 0.5 mg via RESPIRATORY_TRACT
  Filled 2018-05-25: qty 2.5

## 2018-05-25 MED ORDER — LACTATED RINGERS IV BOLUS
500.0000 mL | Freq: Once | INTRAVENOUS | Status: AC
  Administered 2018-05-25: 500 mL via INTRAVENOUS

## 2018-05-25 MED ORDER — LEVALBUTEROL HCL 0.63 MG/3ML IN NEBU
0.6300 mg | INHALATION_SOLUTION | Freq: Four times a day (QID) | RESPIRATORY_TRACT | Status: DC
  Administered 2018-05-25: 14:00:00 0.63 mg via RESPIRATORY_TRACT
  Filled 2018-05-25: qty 3

## 2018-05-25 MED ORDER — HYDROMORPHONE HCL 1 MG/ML IJ SOLN
0.5000 mg | INTRAMUSCULAR | Status: DC | PRN
  Administered 2018-05-25 – 2018-05-27 (×4): 0.5 mg via INTRAVENOUS
  Administered 2018-05-28 (×2): 1 mg via INTRAVENOUS
  Administered 2018-05-28: 06:00:00 0.5 mg via INTRAVENOUS
  Filled 2018-05-25 (×7): qty 1

## 2018-05-25 MED ORDER — LACTATED RINGERS IV SOLN
INTRAVENOUS | Status: DC
  Administered 2018-05-25: 09:00:00 via INTRAVENOUS

## 2018-05-25 MED ORDER — BIOTENE DRY MOUTH MT LIQD: 15.0000 mL | OROMUCOSAL | Status: DC | PRN

## 2018-05-25 MED ORDER — IPRATROPIUM BROMIDE 0.02 % IN SOLN: 0.5000 mg | Freq: Four times a day (QID) | RESPIRATORY_TRACT | Status: DC | PRN

## 2018-05-25 NOTE — Consult Note (Signed)
Consultation Note Date: 05/25/2018   Patient Name: [name]  DOB: [birth year]  MRN: [id number]  Age / Sex: 85 y.o., female   PCP: [name], MD Referring Physician: Kerney Elbe, DO   REASON FOR CONSULTATION:Establishing goals of care  Palliative Care consult requested for this 85 y.o. female with multiple medical problems including stage IV metastatic urothelial carcinoma of the left renal pelvis extending into the perinephric fat and adrenal gland (02/2018), mention, cecal carcinoma status post right hemicolectomy (2003), EMEA, type 2 diabetes, GERD, hypertension, hypothyroidism, and osteoporosis.  Patient was admitted after complaints from nursing home facility of altered mental status with increased confusion, decreased response, and decreased p.o. intake.  CT of abdomen and pelvis with contrast showed multiple hepatic lesions consistent with metastatic disease and prominent stool volume in the distal colon and rectum.   Clinical Assessment and Goals of Care: I have reviewed medical records including lab results, imaging, Epic notes, and MAR, received report from the bedside RN, and assessed the patient.  I spoke with patient's son/POA [name] over the phone to discuss diagnosis prognosis, [place], EOL wishes, disposition and options.  Patient is cachectic, will not follow commands, does not open eyes, minimally responsive, will withdraw from painful stimuli.  I introduced Palliative Medicine as specialized medical care for people living with serious illness. It focuses on providing relief from the symptoms and stress of a serious illness. The goal is to improve quality of life for both the patient and the family.  We discussed a brief life review of the patient, along with her functional and nutritional status.  Son reports patient has had an extensive life history.  He is the only child.  States his mother lives with him prior to a decline in health over 4-6 weeks ago.  He  states she has been in a nursing facility for the past month for rehabilitation.  He reports this time last week patient was alert and able to have a conversation with some obvious signs of dementia.  He reports noticing increased in weakness and that his mother was beginning to sleep more than usual.  We discussed Her current illness and what it means in the larger context of Her on-going co-morbidities. With specific discussions regarding her metastatic cancer and decreased to status.  Natural disease trajectory and expectations at EOL were discussed.  [name] verbalized understanding and confirms having conversation with oncologist earlier today.  He expresses his awareness that his mother health has declined and expresses wishes for her to be kept comfortable and not have any pain.  He is tearful when discussing her end-of-life wishes and states that she had told him in previous conversations she did not want to pass away at a hospice facility.  He states if she is able to be discharged he would hold she could return back to her facility and receive hospice support there.  Detailed discussion in regards to her current condition and son is aware given patient decline in health she most likely would pass away while in the hospital.  He verbalized understanding and appreciation of all care and again requesting she be kept comfortable and not to suffer.  I attempted to elicit values and goals of care important to the patient.    The difference between aggressive medical intervention and comfort care was considered in light of the patient's goals of care. [name] expressed his awareness of her metastatic cancer and wishes for comfort care only. I educated son  on what comfort care measures would look like. I discussed with him patient would no longer receive aggressive medical interventions such as continuous vital signs, lab work, radiology testing, or medications not focused on comfort. Care would focus on how the  patient is looking and feeling. This care would include management of any symptoms that may cause discomfort, pain, shortness of breath, cough, nausea, agitation, anxiety, and/or secretions etc. Symptoms would be managed with medications and other non-pharmacological interventions such as spiritual support if requested, repositioning, music therapy, or therapeutic listening. randy verbalized understanding and appreciation. He is requesting for patient to be kept comfortable for EOL care. Support given.   [name] confirms DNR/DNI.  [name] is aware that given patient is transitioning to for comfort with an anticipated hospital death, he will be allowed to come in and visit patient despite visitor restrictions.  I asked son if he had any COVID 19 symptoms or had he been around anyone with a positive test.  Son denied having any symptoms or contact with positive individuals.  He is aware of the process of being screened again once he arrived to visit with his mother.  [name] is appreciative of all care his mother is receiving in the opportunity to be with her during this critical time.  Questions and concerns were addressed.  Son was encouraged to call with questions or concerns.  PMT will continue to support holistically.   SOCIAL HISTORY:     reports that she has never smoked. She has never used smokeless tobacco. She reports that she does not drink alcohol or use drugs.  CODE STATUS: DNR  ADVANCE DIRECTIVES: Primary Decision Maker: Allison Quarry (son) HCPOA: Yes   SYMPTOM MANAGEMENT:  Dilaudid as needed for pain/shortness of breath  Ativan as needed for anxiety/agitation  Robinul PRN for excessive secretions  Liquifilm PRN for dry eyes  Palliative Prophylaxis:   Aspiration, Frequent Pain Assessment, Oral Care and Turn Reposition  PSYCHO-SOCIAL/SPIRITUAL:  Support System: Son  Desire for further Chaplaincy support: No  Additional Recommendations (Limitations, Scope, Preferences):   Full Comfort Care   PAST MEDICAL HISTORY: Past Medical History:  Diagnosis Date  . Abdominal pain   . Anemia   . Calcification of aorta (HCC) 11/16/2012  . Coronary artery calcification 02/14/2018   cardiologist-- dr t. Oval Linsey  . Depression   . Diabetes mellitus type 2, diet-controlled ([place])    pt denies  . DJD (degenerative joint disease)   . DM II (diabetes mellitus, type II), controlled 11/16/2012  . Dysuria   . Epistaxis   . Family history of adverse reaction to anesthesia    sister--- ponv  . Fibromyalgia   . GERD (gastroesophageal reflux disease)   . Goiter   . Gross hematuria   . Headache(784.0)    tension headache daily per pt   . Heart murmur   . Hiatal hernia   . History of vertebral compression fracture   . Hypertension   . Hypothyroidism    endocrinologist-- dr Chalmers Cater  . Left adrenal mass ([place])   . Left renal mass   . Left thyroid nodule    hx biopsy 2013, benign  . Macular degeneration    both  left > right  . Osteoarthritis   . Osteoporosis   . Peripheral vascular disease ([place])    varicose veins in left arm   . 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 PONV (postoperative nausea and vomiting)   . Renal cell cancer, left [facility])    oncologist-- dr Alen Blew  . TMJ syndrome    right   . UTI (urinary tract infection) 02/28/2018  . Wears glasses     PAST SURGICAL HISTORY:  Past Surgical History:  Procedure Laterality Date  . APPENDECTOMY  age 89  . CATARACT EXTRACTION W/ INTRAOCULAR LENS  IMPLANT, BILATERAL  2004  approx.  . CHOLECYSTECTOMY N/A 11/17/2012   Procedure: LAPAROSCOPIC CHOLECYSTECTOMY;  Surgeon: Leighton Ruff, MD;  Location: [facility];  Service: General;  Laterality: N/A;  . COLONOSCOPY W/ BIOPSIES  09/25/2002, 09/18/2001  . ERCP N/A  11/14/2012   Procedure: ENDOSCOPIC RETROGRADE CHOLANGIOPANCREATOGRAPHY (ERCP);  Surgeon: Jeryl Columbia, MD;  Location: [facility];  Service: Endoscopy;  Laterality: N/A;  . ESOPHAGOGASTRODUODENOSCOPY ENDOSCOPY    . KYPHOPLASTY  01/2011  . LAPAROSCOPIC RIGHT HEMI COLECTOMY  10-03-2001    dr Hassell Done @[facility]   . [place]  2000  . porta catheter placement     REMOVAL 2005  . ROBOT ASSISTED LAPAROSCOPIC NEPHRECTOMY Left 03/09/2018   Procedure: XI ROBOTIC ASSISTED LAPAROSCOPIC NEPHRECTOMY;  Surgeon: Cleon Gustin, MD;  Location: [facility];  Service: Urology;  Laterality: Left;  3 HRS  . TEMPOROMANDIBULAR JOINT SURGERY Right 1990s  . TONSILLECTOMY  child  . TOTAL HIP ARTHROPLASTY Right 05/30/2015   Procedure: TOTAL HIP ARTHROPLASTY ANTERIOR APPROACH;  Surgeon: Rod Can, MD;  Location: [facility];  Service: Orthopedics;  Laterality: Right;  . TOTAL KNEE ARTHROPLASTY  12/20/2010   Procedure: TOTAL KNEE ARTHROPLASTY;  Surgeon: Mauri Pole;  Location: [facility];  Service: Orthopedics;  Laterality: Right;  . TOTAL KNEE ARTHROPLASTY Right 12-20-2010  dr Alvan Dame @[facility]   . UPPER GASTROINTESTINAL ENDOSCOPY  09/18/2001    ALLERGIES:  is allergic to tape; latex; lidocaine; and sulfa antibiotics.   MEDICATIONS:  Current Facility-Administered Medications  Medication Dose Route Frequency Provider Last Rate Last Dose  . bisacodyl (DULCOLAX) suppository 10 mg  10 mg Rectal Daily Georgette Shell, MD   10 mg at 05/25/18 4782  . HYDROmorphone (DILAUDID) injection 0.5 mg  0.5 mg Intravenous Q3H PRN Georgette Shell, MD      . ipratropium (ATROVENT) nebulizer solution 0.5 mg  0.5 mg Nebulization Q6H PRN Sheikh, Omair Latif, DO      . lactated ringers infusion   Intravenous Continuous Raiford Noble Bunch, DO 75 mL/hr at 05/25/18 [phone]    . levalbuterol (XOPENEX) nebulizer solution 0.63 mg  0.63 mg Nebulization Q6H PRN Sheikh, Omair Latif, DO        VITAL SIGNS: BP (!) 152/80 (BP Location: Right Arm)    Pulse (!) 114   Temp 97.6 F (36.4 C) (Oral)   Resp 18   Ht 5\' 5"  (1.651 m)   Wt 48.4 kg   LMP 01/17/1994   SpO2 100%   BMI 17.76 kg/m  Filed Weights   05/18/2018 1035  Weight: 48.4 kg    Estimated body mass index is 17.76 kg/m as calculated from the following:   Height as of this encounter: 5\' 5"  (1.651 m).   Weight as of this encounter: 48.4 kg.  LABS: CBC:    Component Value Date/Time   WBC 18.9 (H) 05/25/2018 0554   HGB 11.6 (L) 05/25/2018 0554   HGB 12.6 07/07/2016 1000   HCT 39.2 05/25/2018 0554  HCT 33.1 (L) 04/14/2018 0426   HCT 39.1 07/07/2016 1000   PLT 503 (H) 05/25/2018 0554   PLT 251 07/07/2016 1000   Comprehensive Metabolic Panel:    Component Value Date/Time   NA 146 (H) 05/25/2018 0554   NA 137 07/07/2015 1040   K 3.7 05/25/2018 0554   K 5.1 07/07/2015 1040   CO2 22 05/25/2018 0554   CO2 27 07/07/2015 1040   BUN 23 05/25/2018 0554   BUN 18.1 07/07/2015 1040   CREATININE 0.96 05/25/2018 0554   CREATININE 1.0 07/07/2015 1040   ALBUMIN 2.3 (L) 05/25/2018 0554   ALBUMIN 3.5 07/07/2015 1040     Review of Systems  Unable to perform ROS: Acuity of condition   Unless otherwise noted, a complete review of systems is negative.  Physical Exam Vitals signs and nursing note reviewed.  Constitutional:      Appearance: She is cachectic. She is ill-appearing.  Cardiovascular:     Rate and Rhythm: Regular rhythm. Tachycardia present.     Pulses: Normal pulses.     Heart sounds: Normal heart sounds.  Pulmonary:     Effort: Pulmonary effort is normal.     Breath sounds: Decreased breath sounds present.     Comments: Room Air  Abdominal:     General: Bowel sounds are normal.     Palpations: Abdomen is soft.  Skin:    General: Skin is warm and dry.  Neurological:     Mental Status: She is lethargic.     Motor: Weakness and atrophy present.     Comments: Unable to assess, will not open eyes, will not follow commands  Psychiatric:        Cognition  and Memory: Cognition is impaired. Memory is impaired.        Judgment: Judgment is inappropriate.    General: NAD, frail appearing, thin Cardiovascular: regular rate and rhythm Pulmonary: clear ant fields Abdomen: soft, nontender, + bowel sounds GU: no suprapubic tenderness Extremities: no edema, no joint deformities Skin: no rashes Neurological: Weakness but otherwise nonfocal   Prognosis: Hours - Days in the setting of great urothelial carcinoma of the left renal pelvis status post nephrectomy with metastatic disease to adrenal gland and possible hepatic involvement, dehydration, right lower lobe pneumonia, altered mental status, failure to thrive, tachycardia, hypertension, acute kidney injury, hyponatremia, constipation, abnormal LFTs, conditioning, cachectic, and poor p.o. intake.  Discharge Planning:  Anticipated Hospital Death  Recommendations:  DNR/DNI-as confirmed by son  Full comfort measures  Son verbalizes patient wishes were not to be placed at residential hospice facility.  Expresses wishes to proceed with comfort during hospitalization if patient does not pass away son is requesting patient return to facility with outpatient hospice.  He is aware to anticipate hospital death.  Son, Allison Quarry has been approved for visitations due to transitioning to for comfort with anticipation hospital death.  Will D/C all orders not comfort focused  Dilaudid as needed for pain/shortness of breath  Ativan as needed for anxiety/agitation  Robinul PRN for excessive secretions  Liquifilm PRN for dry eyes  Palliative medicine team will continue to support and follow   Palliative Performance Scale: PPS 10%              Patient's son [name] expressed understanding and was in agreement with this plan.   Thank you for allowing the Palliative Medicine Team to assist in the care of this patient.  Time In: 1415 Time Out: 1530 Time Total: 75 min  Visit consisted of  counseling and education dealing with the complex and emotionally intense issues of symptom management and palliative care in the setting of serious and potentially life-threatening illness.Greater than 50%  of this time was spent counseling and coordinating care related to the above assessment and plan.  Signed by:  Alda Lea, AGPCNP-BC Palliative Medicine Team  Phone: [phone] Fax: [phone] Pager: [phone] Amion: Bjorn Pippin

## 2018-05-25 NOTE — Progress Notes (Signed)
Nutrition Note  RD consulted for nutritional assessment and poor PO intake.   Per chart review, pt to have palliative care see for Tony. Pt is nonverbal, unable to provide any history and currently NPO. Per oncology, MD is recommending comfort care.  RD will monitor for GOC decisions and if pt remains full code will proceed with nutrition assessment.  Clayton Bibles, MS, RD, [place] Dietitian Pager: [phone] After Hours Pager: [phone]

## 2018-05-25 NOTE — Progress Notes (Addendum)
HEMATOLOGY-ONCOLOGY PROGRESS NOTE  SUBJECTIVE: [name] is an 85 year old female with stage IV urothelial carcinoma of the left renal pelvis extending into the perinephric fat and adrenal gland diagnosed in February 2020.  She also has potential hepatic metastasis.  She underwent a left robotic laparoscopic radical nephrectomy and adrenalectomy in February 2020.  Lymph node dissection was not performed.  She has been on observation in active surveillance since that time.  The patient is now admitted with altered mental status.  Work-up in the ER included a CT of the abdomen and pelvis which showed interval left nephrectomy and adrenalectomy.  There is now a large, irregular, peripherally enhancing soft tissue process in the left retroperitoneal space, tracking craniocaudally along the psoas muscle, anteriorly up anterior to the aorta and posteriorly behind the spleen and down into the lower left paracolic gutter.  Imaging features are most suggestive of markedly advanced local recurrence of neoplasm.  Although a dissecting abscess is possible, it is thought to be less likely.  There is also apparent involvement of the distal descending colon and inferior spleen.  Lateral extent towards the tail of pancreas obliterates the splenic vein.  There are also interval development of multiple hepatic lesions consistent with metastatic disease.  A central right liver lesion obstructs right hepatic bile ducts.  There is a 1.4 cm metastatic lesion in the omentum of the splenic flexure with another 1.4 cm nodule adjacent to the descending colon.  The patient was admitted due to her altered mental status likely secondary to dehydration, pain, and possible left lower lobe pneumonia.  When seen this morning, the patient is unable to provide any history.  She is resting quietly in the bed.  REVIEW OF SYSTEMS:   Review of systems could not be obtained secondary to patient status.  I have reviewed the past medical history,  past surgical history, social history and family history with the patient and they are unchanged from previous note.   PHYSICAL EXAMINATION:  Vitals:   05/25/18 0645 05/25/18 0700  BP: (!) 148/90 (!) 152/80  Pulse: (!) 119 (!) 114  Resp: 20 18  Temp: 97.6 F (36.4 C) 97.6 F (36.4 C)  SpO2: 100% 100%   Filed Weights   05/25/2018 1035  Weight: 106 lb 11.2 oz (48.4 kg)    Intake/Output from previous day: 05/07 0701 - 05/08 0700 In: 1619.1 [I.V.:1019.1; IV Piggyback:600] Out: -   GENERAL: Cachectic, chronically ill.  Opens eyes but is nonverbal. LUNGS: clear to auscultation and percussion with normal breathing effort HEART: Tachycardic.  No murmurs and no lower extremity edema ABDOMEN:abdomen soft, non-tender.  Hypoactive bowel sounds. Musculoskeletal:no cyanosis of digits and no clubbing  NEURO: Opens eyes.  Unable to answer questions.  Moans intermittently.  LABORATORY DATA:  I have reviewed the data as listed CMP Latest Ref Rng & Units 05/25/2018 05/30/2018 05/18/2018  Glucose 70 - 99 mg/dL 159(H) 170(H) 284(H)  BUN 8 - 23 mg/dL 23 36(H) 26(H)  Creatinine 0.44 - 1.00 mg/dL 0.96 1.46(H) 1.15(H)  Sodium 135 - 145 mmol/L 146(H) 142 136  Potassium 3.5 - 5.1 mmol/L 3.7 4.0 4.8  Chloride 98 - 111 mmol/L 112(H) 104 97(L)  CO2 22 - 32 mmol/L 22 26 28   Calcium 8.9 - 10.3 mg/dL 9.0 9.5 9.2  Total Protein 6.5 - 8.1 g/dL 6.6 7.6 -  Total Bilirubin 0.3 - 1.2 mg/dL 1.1 0.9 -  Alkaline Phos 38 - 126 U/L 1,332(H) 1,698(H) -  AST 15 - 41 U/L 39 49(H) -  ALT 0 - 44 U/L 47(H) 55(H) -    Lab Results  Component Value Date   WBC 18.9 (H) 05/25/2018   HGB 11.6 (L) 05/25/2018   HCT 39.2 05/25/2018   MCV 102.1 (H) 05/25/2018   PLT 503 (H) 05/25/2018   NEUTROABS 15.7 (H) 06/12/2018    Dg Chest 1 View  Result Date: 05/02/2018 CLINICAL DATA:  85 year old female with a fall EXAM: CHEST  1 VIEW COMPARISON:  04/13/2018 FINDINGS: Cardiomediastinal silhouette unchanged in size and contour. No  evidence of central vascular congestion or interlobular septal thickening. Diffuse coarsening of interstitial markings similar to the prior. No pneumothorax or pleural effusion. No confluent airspace disease. Pleuroparenchymal thickening at the apices. No acute displaced fracture. Degenerative changes of the spine and the shoulders. IMPRESSION: Negative for acute cardiopulmonary disease Electronically Signed   By: Corrie Mckusick D.O.   On: 05/02/2018 18:59   Dg Chest 2 View  Result Date: 06/14/2018 CLINICAL DATA:  Weakness. EXAM: CHEST - 2 VIEW COMPARISON:  05/18/2018 FINDINGS: Patchy opacity noted right lung base. Interstitial markings are diffusely coarsened with chronic features. Stable asymmetric elevation right hemidiaphragm. The cardiopericardial silhouette is within normal limits for size. The visualized bony structures of the thorax are intact. Status post lower thoracic vertebral augmentation. Telemetry leads overlie the chest. IMPRESSION: Underlying chronic interstitial changes with hazy opacity at the right lung base, suspicious for pneumonia. Electronically Signed   By: Misty Stanley M.D.   On: 06/10/2018 12:50   Dg Pelvis 1-2 Views  Result Date: 05/02/2018 CLINICAL DATA:  85 year old female with fall EXAM: PELVIS - 1-2 VIEW COMPARISON:  02/27/2010 FINDINGS: Bony pelvic ring is intact. No acute fracture identified. Surgical changes of right hip arthroplasty. Components are aligned. Left hip projects normally over the acetabula. No periprosthetic fracture with unremarkable proximal right femur. Calcified mass of the abdomen, better demonstrated on CT from February. IMPRESSION: No acute bony abnormality. Right hip arthroplasty. Electronically Signed   By: Corrie Mckusick D.O.   On: 05/02/2018 19:58   Ct Head Wo Contrast  Result Date: 05/27/2018 CLINICAL DATA:  Altered level of consciousness EXAM: CT HEAD WITHOUT CONTRAST TECHNIQUE: Contiguous axial images were obtained from the base of the skull  through the vertex without intravenous contrast. COMPARISON:  05/02/2018 FINDINGS: Motion degraded images. Brain: No evidence of acute infarction, hemorrhage, hydrocephalus, extra-axial collection or mass lesion/mass effect. Subcortical white matter and periventricular small vessel ischemic changes. Vascular: Mild intracranial atherosclerosis. Skull: Normal. Negative for fracture or focal lesion. Sinuses/Orbits: The visualized paranasal sinuses are essentially clear. The mastoid air cells are unopacified. Other: None. IMPRESSION: Motion degraded images. No evidence of acute intracranial abnormality. Small vessel ischemic changes. Electronically Signed   By: Julian Hy M.D.   On: 06/14/2018 12:22   Ct Head Wo Contrast  Result Date: 05/02/2018 CLINICAL DATA:  85 year old female with a fall EXAM: CT HEAD WITHOUT CONTRAST TECHNIQUE: Contiguous axial images were obtained from the base of the skull through the vertex without intravenous contrast. COMPARISON:  MR 04/11/2018, no prior head CT FINDINGS: Brain: No acute intracranial hemorrhage. No midline shift or mass effect. Gray-white differentiation maintained. Unremarkable appearance of the ventricular system. Brain volume loss. Patchy hypodensity in the periventricular white matter. Vascular: Vascular calcifications Skull: No acute fracture.  No aggressive bone lesion identified. Sinuses/Orbits: Unremarkable appearance of the orbits. Mastoid air cells clear. No middle ear effusion. No significant sinus disease. Other: None IMPRESSION: Negative for acute intracranial abnormality. Evidence of chronic microvascular ischemic disease. Electronically Signed  By: Corrie Mckusick D.O.   On: 05/02/2018 19:01   Ct Abdomen Pelvis W Contrast  Result Date: 06/05/2018 CLINICAL DATA:  Abdominal pain with nausea vomiting. EXAM: CT ABDOMEN AND PELVIS WITH CONTRAST TECHNIQUE: Multidetector CT imaging of the abdomen and pelvis was performed using the standard protocol  following bolus administration of intravenous contrast. CONTRAST:  13mL OMNIPAQUE IOHEXOL 300 MG/ML  SOLN COMPARISON:  To 12/07/2018 FINDINGS: Lower chest: Coronary artery calcification is evident. Emphysema noted in the lung bases. Stable 9 mm ground-glass nodule posterior right lower lobe. Hepatobiliary: New hypervascular 10 mm lesion in the dome of liver (11/2) liver parenchyma heterogeneous. 3.4 cm rim enhancing lesion in the left liver (16/7) is new in the interval. 2.2 cm lesion in the central right liver (23/2). Areas of irregular subcapsular enhancement are seen scattered in the right and left hepatic lobes. Gallbladder surgically absent. No extrahepatic biliary duct dilatation although pneumobilia suggests prior sphincterotomy. Obstructs the right intrahepatic bile ducts resulting in new dilatation. Pancreas: Diffuse pancreatic atrophy. No dilatation of the main duct. Spleen: Similar appearance 7 mm low-density lesion inferior spleen. Adrenals/Urinary Tract: Right adrenal gland and kidney unremarkable. Patient status post interval left left nephrectomy and adrenalectomy. Plaque-like area of irregular peripherally enhancing soft tissues noted in the left retroperitoneal space, measuring 10 cm in craniocaudal length by 8 cm in AP dimension by 2.7 cm in coronal diameter. This tracks caudally down along the left psoas muscle towards the pelvis. Lateral extent of this soft tissue towards the tail of pancreas appears to occlude the splenic vein. Bladder is partially obscured by artifact from right hip replacement. Stomach/Bowel: Stomach is unremarkable. No gastric wall thickening. No evidence of outlet obstruction. Duodenum is normally positioned as is the ligament of Treitz. No small bowel wall thickening. No small bowel dilatation. Colon is nondilated. Prominent stool volume in the sigmoid colon and rectum. Vascular/Lymphatic: There is abdominal aortic atherosclerosis without aneurysm. No pelvic sidewall  lymphadenopathy. Reproductive: The uterus is surgically absent. There is no adnexal mass. Other: Calcified irregular right mesenteric lesion is similar to prior. As mentioned above, there is plaque-like irregular peripherally enhancing soft tissue in the left nephrectomy bed tracks cranio caudally along the psoas muscle. Anteriorly this tracks up along the left side in the anterior to the aorta. Posteriorly it tracks around behind the spleen in the down caudally into the left paracolic gutter (image 96/2) where it may involve the medial wall of the distal descending colon (38/2). As noted above, the soft tissue extends laterally towards the tail of pancreas and appears to obstruct the splenic vein. 14 mm omental nodule identified in the splenic flexure on 31/2. 14 mm nodule seen anterior to the descending colon on 55/2 Musculoskeletal: Status post right hip replacement. Age indeterminate fracture of the posterior left eighth rib with possible inferior left scapula fracture noted on image 1 of series 2. IMPRESSION: 1. Interval left nephrectomy and adrenalectomy. There is now a large, irregular, peripherally-enhancing soft tissue process in the left retroperitoneal space, tracking cranio caudally along the psoas muscle, anteriorly up anterior to the aorta and posteriorly behind the spleen and the down into the lower left paracolic gutter. Imaging features are most suggestive of markedly advanced local recurrence of neoplasm. Although dissecting abscess is possible, this is felt to be less likely based on imaging. There is apparent involvement of the distal descending colon and inferior spleen. Lateral extent towards the tail of pancreas obliterates the splenic vein. 2. Interval development of multiple hepatic lesions  consistent with metastatic disease. A central right liver lesion obstructs right hepatic bile ducts. 3. 1.4 cm metastatic lesion in the omentum of the splenic flexure with another 1.4 cm nodule adjacent  to the descending colon. 4. Stable appearance of the calcified irregular central mesenteric lesion of the right paramidline abdomen. 5. Prominent stool volume distal colon and rectum. 6.  Aortic Atherosclerois (ICD10-170.0) Electronically Signed   By: Misty Stanley M.D.   On: 06/06/2018 15:03   Dg Chest Port 1 View  Result Date: 05/18/2018 CLINICAL DATA:  Chest pain. EXAM: PORTABLE CHEST 1 VIEW COMPARISON:  05/02/2018 FINDINGS: There is a mildly displaced posterior left seventh rib fracture that may be new. Negative for a pneumothorax. No focal lung disease or overt pulmonary edema. Heart and mediastinum are within normal limits. Again noted is bone cement in a lower thoracic vertebral body compatible with an augmentation procedure. Atherosclerotic calcifications at the aortic arch. IMPRESSION: Probable new left seventh rib fracture.  Negative for pneumothorax. Electronically Signed   By: Markus Daft M.D.   On: 05/18/2018 09:55   Dg Shoulder Left  Result Date: 05/02/2018 CLINICAL DATA:  85 year old female with a fall EXAM: LEFT SHOULDER - 2+ VIEW COMPARISON:  None. FINDINGS: Osteopenia. Glenohumeral joint appears congruent. No acute displaced fracture. Unremarkable clavicle. Degenerative changes of the acromioclavicular joint. Significant subacromial spurring. Rounded calcifications overlying the humerus likely representing loose bodies. IMPRESSION: Negative for acute bony abnormality. Degenerative changes Electronically Signed   By: Corrie Mckusick D.O.   On: 05/02/2018 19:00    ASSESSMENT AND PLAN: 85 year old woman with the following:  1.  High-grade urothelial carcinoma of the left renal pelvis.  She is status post left nephrectomy with disease involvement in the adrenal gland.  CT scan on 02/27/2018 showed possible hepatic involvement.  CT scan performed in the emergency room shows disease progression.  The patient has a poor performance status and is not a candidate for any additional treatment.   Recommend palliative care referral to discuss goals of care.  Recommend hospice for this patient.  2.  Dehydration.  Continue IV fluids.  3.  Right lower lobe pneumonia.  Continue antibiotics per hospitalist team.    LOS: 1 day   Mikey Bussing, DNP, AGPCNP-BC, AOCNP 05/25/18   Patient seen and examined and agree with the note above.  She appears minimally responsive but comfortable.  Imaging studies were personally reviewed as well.  Overall prognosis is very poor with limited life expectancy given her advanced disease and overall deterioration.  I have recommended comfort care only given her limited life expectancy.  I will communicate these findings as well with her son and answer his questions.  Zola Button MD 05/25/2018

## 2018-05-25 NOTE — Progress Notes (Signed)
PROGRESS NOTE    [name]  QIO:[id number] DOB: [birth year] DOA: 06/01/2018 PCP: [name], MD   Brief Narrative:  HPI per [name] on 05/21/2018 [name] is a 85 y.o. 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  Upon discussion with the son he reported that she slept day and night at home.  She was sent from the nursing home today with complaints of change in mental status and increasing confusion and less responsive with decreased p.o. intake.  No documented fever chills cough nausea vomiting diarrhea or constipation.  History is obtained mainly from the records and patient's son and the ER physician.  Patient unable to provide any history she only responds to pain.  She has history of recurrent falls.  **Interim History  Patient remains noncommunicative but does respond to painful stimuli.  Medical oncology has consulted and recommending comfort care.  Palliative consulted for goals of care discussion and she was continued to be treated until palliative evaluated and they discussed with the family about goals of care and patient was trying to transition to comfort care.  Patient has a high risk for decompensation, worsening and possible inpatient death.  Assessment & Plan:   Active Problems:   Dehydration   Change in mental status   AKI (acute kidney injury) ([place])   Healthcare-associated pneumonia  Acute Change in Mental Status  -Multifactorial secondary to dehydration, pain, possible left lower lobe pneumonia.   -Chest x-ray shows right lung base opacity consistent with pneumonia.  -She was continued on IV Cefepime but this has now stopped given  Transition to [place].   -Will not Check MRSA PCR now.   -Patient received IV Vancomycin and Cefepime in the ER. -Follow-up blood cultures showed NGTD <24 Hours -Patient was admitted with decreased p.o. intake and decreased responsiveness more confused than her baseline.   -Patient had multiple hospital admissions since January 2020 for major surgery including left nephrectomy recurrent falls and dehydration.   -Patient was not awake enough to take anything by mouth and was started on IVF but this has now stopped due to patient being transitioned to Comfort Care\ -Lactic acid 1.5 on admission   -WBC was 18.3 and worsened to 18.9.   -UA shows ketones negative for leukocytes and nitrites.   -COVID was not done due to low suspicion. -After palliative care discussion, all Medicationss not in the patient's comfort have been discontinued including IV antibiotics and IV fluids.  She will be continued on antiseptic oral ins Biotene 15 mL's topically for swish and spit, bisacodyl 10 mg rectal suppositories daily, glycopyrrolate 0.2 mg IV every 4 PRN excessive secretions, IV hydromorphone 0.5 to 1 mg every 1 hour for moderate and severe pain as well as shortness of breath and she also be continued on ipratropium and Xopenex nebs every 6 PRN for wheezing or shortness of breath -Also initiated was lorazepam 0.5 mg IV every 4 PRN for anxiety and ondansetron 4 mg IV every 6 as needed for nausea as well as liquid form tears 1 drop both eyes 4 times daily as needed dry eyes  High-grade Urothelial carcinoma status post left nephrectomy with possible liver mets  -In  February 2020 patient was discharged to [facility] was to evaluate for chemotherapy versus immunotherapy versus palliative care to improve her quality of life.   -[name] discussed with son regarding palliative care consult. -He agrees to palliative care consult.  But he reports his mother told him never to put her on hospice.  Dr.  Alen Blew is her oncologist. -Consulted palliative care as Patient has no quality of life and is a candidate for hospice.   -[name] aware of patient admission and recommending the patient be transitioned to comfort care given her limited life expectancy -After palliative care discussion, all Medicationss not in the patient's comfort have been discontinued including IV antibiotics and IV fluids.  She will be continued on antiseptic oral ins Biotene 15 mL's topically for swish and spit, bisacodyl 10 mg rectal suppositories daily, glycopyrrolate 0.2 mg IV every 4 PRN excessive secretions, IV hydromorphone 0.5 to 1 mg every 1 hour for moderate and severe pain as well as shortness of breath and she also be continued on ipratropium and Xopenex nebs every 6 PRN for wheezing or shortness of breath -Also initiated was lorazepam 0.5 mg IV every 4 PRN for anxiety and ondansetron 4 mg IV every 6 as needed for nausea as well as liquid form tears 1 drop both eyes 4 times daily as needed dry eyes  Failure to Thrive with recurrent hospital admissions for dehydration and decreased p.o. intake.   -Dietary consult was initiated but canceled as she is now Comfort Measures  Tachycardia -Secondary to pain and dehydration most likely. -Ordered Tramadol and an LR Bolus and was started on Maintenance IVF with LR at 75 mL/hr but after [place] discussion she is now Comfort Measures and Tramadol has stopped  Hypertension  -Hhydralazine PRN IV now stopped as she is being transitioned to [place]   Dehydration with dry mucous membrane and change in mental status  -Continued IV fluids and gave Bolus this AM but after Palliative Care GOC Discussion with patients Family, she will be transitioned to [place] and is now on 10 mL/hr of LR  AKI  -Improving. Patient's BUN/creatinine went from 36/1.46 now 23/0.96 -Continued with IV fluid hydration but have changed this to LR at 75 mL's per hour instead of normal saline given  her hyperchloremia and hypernatremia; Palliative reduced Rate to 10 mL/hr now given Transition to Comfort Measures  -Avoid nephrotoxic medications if possible along with hypotension as well as contrast dyes -Will not check CMP as patient is being transitioned to Comofort Measures   Abnormal LFTs  -Hhighly likely secondary to liver mets -Mildly improved as AST went from 75 is now 39 and ALT went from 55 is now 47 -Will not check CMP as patient is being transitioned to Comofort Measures   Hypernatermia/Hyperchloremia -Patient's Na+ went from 142 -> 146 and Patient's Chloride went from 104 -> 112 -Change IV fluids from normal saline to lactated Ringer's and given lactated Ringer's bolus of 500 mLs this AM.  -Will not check CMP as patient is being transitioned to Comofort Measures   Constipation  -CT scan showed heavy stool burden  -She was given a Biscaodyu suppository since she is not awake enough to take medications by mouth. -Improved   FOBT positive in the ER  -Hemoglobin/Hct Stable and went from 11.6/37.5 -> 11.6/39.2 -Has a History of colon cancer and metastatic high-grade urothelial cancer -Will not repeat as Patient is being transitioned to Comfort Care   DVT prophylaxis: None  Now that patient is being transitioned to Comfort Measures  Code Status: DO NOT RESUSCITATE  Family Communication: No family present at bedside  Disposition Plan: Patient Transitioned to [place] and has a very high risk of Decompensation   Consultants:   Medical Oncology   Palliative Care Medicine    Procedures: None  Antimicrobials:  Anti-infectives (From admission, onward)   Start     Dose/Rate Route Frequency Ordered Stop   06/12/2018 2200  ceFEPIme (MAXIPIME) 2 g in sodium chloride 0.9 % 100 mL IVPB  Status:  Discontinued     2 g 200 mL/hr over 30 Minutes Intravenous Every 12 hours 05/19/2018 1542 05/25/18 1452   05/26/2018 1415  vancomycin (VANCOCIN) IVPB 1000 mg/200 mL premix     1,000  mg 200 mL/hr over 60 Minutes Intravenous  Once 06/13/2018 1409 05/21/2018 1543   06/17/2018 1415  ceFEPIme (MAXIPIME) 2 g in sodium chloride 0.9 % 100 mL IVPB     2 g 200 mL/hr over 30 Minutes Intravenous  Once 06/09/2018 1409 06/12/2018 1514     Subjective: Seen and examined at bedside and she was very unresponsive but did withdraw to pain.  Nursing reported that her mews score was 5 and she is remains tachycardic.  Patient unable to provide a subjective history unable to be responsive during the encounter except pain.  No other concerns or complaints at this time and after palliative care discussion she has been made comfort measures only.  Objective: Vitals:   05/25/18 0630 05/25/18 0645 05/25/18 0700 05/25/18 1300  BP: (!) 161/86 (!) 148/90 (!) 152/80   Pulse: (!) 118 (!) 119 (!) 114   Resp: (!) 30 20 18    Temp: (!) 97.4 F (36.3 C) 97.6 F (36.4 C) 97.6 F (36.4 C)   TempSrc: Oral Oral Oral   SpO2: 100% 100% 100% 100%  Weight:      Height:        Intake/Output Summary (Last 24 hours) at 05/25/2018 1614 Last data filed at 05/25/2018 0300 Gross per 24 hour  Intake 1119.07 ml  Output --  Net 1119.07 ml   Filed Weights   05/31/2018 1035  Weight: 48.4 kg   Examination: Physical Exam:  Constitutional: Thin frail elderly Caucasian female in NAD and appears calm but withdraws to pain  Eyes: Has eyes closed throughout the encounter  ENMT: External Ears, Nose appear normal. Grossly normal hearing. Mucous membranes are slightly dry.  Neck: Appears normal, supple, no cervical masses, normal ROM, no appreciable thyromegaly; no JVD Respiratory: Diminished to auscultation bilaterally, no wheezing, rales, rhonchi or crackles. Normal respiratory effort and patient is not tachypenic. No accessory muscle use. Unlabored Breathing and not wearing supplemental O2 Cardiovascular: Tachycardic Rate but regular rhythm, no murmurs / rubs / gallops. S1 and S2 auscultated.  Abdomen: Soft, non-tender,  non-distended. No masses palpated. No appreciable hepatosplenomegaly. Bowel sounds positive x4.  GU: Deferred. Musculoskeletal: No clubbing / cyanosis of digits/nails. No joint deformity upper and lower extremities.  Skin: No rashes, lesions, ulcers on a limited skin evaluation. No induration; Warm and dry.  Neurologic: Unresponsive but withdraws to pain .  Psychiatric: Unable to fully assess due to patient's current condition.   Data Reviewed: I have personally reviewed following labs and imaging studies  CBC: Recent Labs  Lab 05/31/2018 1112 05/25/18 0554  WBC 18.3* 18.9*  NEUTROABS 15.7*  --   HGB 11.6* 11.6*  HCT 37.5 39.2  MCV 101.6* 102.1*  PLT 559* 503*   Basic  Metabolic Panel: Recent Labs  Lab 05/19/2018 1112 05/25/18 0554  NA 142 146*  K 4.0 3.7  CL 104 112*  CO2 26 22  GLUCOSE 170* 159*  BUN 36* 23  CREATININE 1.46* 0.96  CALCIUM 9.5 9.0   GFR: Estimated Creatinine Clearance: 33.9 mL/min (by C-G formula based on SCr of 0.96 mg/dL). Liver Function Tests: Recent Labs  Lab 06/05/2018 1112 05/25/18 0554  AST 49* 39  ALT 55* 47*  ALKPHOS 1,698* 1,332*  BILITOT 0.9 1.1  PROT 7.6 6.6  ALBUMIN 2.6* 2.3*   No results for input(s): LIPASE, AMYLASE in the last 168 hours. Recent Labs  Lab 05/28/2018 1117  AMMONIA 29   Coagulation Profile: No results for input(s): INR, PROTIME in the last 168 hours. Cardiac Enzymes: No results for input(s): CKTOTAL, CKMB, CKMBINDEX, TROPONINI in the last 168 hours. BNP (last 3 results) No results for input(s): PROBNP in the last 8760 hours. HbA1C: No results for input(s): HGBA1C in the last 72 hours. CBG: No results for input(s): GLUCAP in the last 168 hours. Lipid Profile: No results for input(s): CHOL, HDL, LDLCALC, TRIG, CHOLHDL, LDLDIRECT in the last 72 hours. Thyroid Function Tests: No results for input(s): TSH, T4TOTAL, FREET4, T3FREE, THYROIDAB in the last 72 hours. Anemia Panel: No results for input(s): VITAMINB12,  FOLATE, FERRITIN, TIBC, IRON, RETICCTPCT in the last 72 hours. Sepsis Labs: Recent Labs  Lab 06/17/2018 1117 05/26/2018 1451  LATICACIDVEN 1.5 1.5    Recent Results (from the past 240 hour(s))  Urine culture     Status: None   Collection Time: 05/28/2018  1:03 PM  Result Value Ref Range Status   Specimen Description   Final    URINE, RANDOM Performed at [facility] [address] [place], [place] [address]    Special Requests   Final    NONE Performed at [facility], [place] [address] [place], [place] [address]    Culture   Final    NO GROWTH Performed at [facility], [place] [address], [place], [place] [address]    Report Status 05/25/2018 FINAL  Final  Blood Culture (routine x 2)     Status: None (Preliminary result)   Collection Time: 06/14/2018  2:09 PM  Result Value Ref Range Status   Specimen Description   Final    BLOOD LEFT ARM Performed at [facility] [address] [place], [place] [address]    Special Requests   Final    BOTTLES DRAWN AEROBIC AND ANAEROBIC Blood Culture results may not be optimal due to an inadequate volume of blood received in culture bottles Performed at [facility] [address] [place], [place] [address]    Culture   Final    NO GROWTH < 24 HOURS Performed at [facility] [address] [place], [place] [address]    Report Status PENDING  Incomplete  Blood Culture (routine x 2)     Status: None (Preliminary result)   Collection Time: 05/23/2018  2:14 PM  Result Value Ref Range Status   Specimen Description   Final    BLOOD LEFT ARM Performed at [facility] [address], [place], [place] [address]    Special Requests   Final    BOTTLES DRAWN AEROBIC AND ANAEROBIC Blood Culture results may not be optimal due to an inadequate volume of blood received in culture bottles Performed at [facility]  [address] [place], [place] [address]    Culture   Final    NO GROWTH < 24 HOURS Performed at [facility], [place] [address] [place], [place] [address]    Report Status PENDING  Incomplete  SARS Coronavirus 2 (CEPHEID - Performed in [place] [facility] hospital lab), Hosp Order     Status: None   Collection Time: 06/17/2018  4:13 PM  Result Value Ref Range Status   SARS Coronavirus 2 NEGATIVE NEGATIVE Final    Comment: (NOTE) If result is NEGATIVE SARS-CoV-2 target nucleic acids are NOT DETECTED. The SARS-CoV-2 RNA is generally detectable in upper and lower  respiratory specimens during the acute phase of infection. The lowest  concentration of SARS-CoV-2 viral copies this assay can detect is 250  copies / mL. A negative result does not preclude SARS-CoV-2 infection  and should not be used as the sole basis for treatment or other  patient management decisions.  A negative result may occur with  improper specimen collection / handling, submission of specimen other  than nasopharyngeal swab, presence of viral mutation(s) within the  areas targeted by this assay, and inadequate number of viral copies  (<250 copies / mL). A negative result must be combined with clinical  observations, patient history, and epidemiological information. If result is POSITIVE SARS-CoV-2 target nucleic acids are DETECTED. The SARS-CoV-2 RNA is generally detectable in upper and lower  respiratory specimens dur ing the acute phase of infection.  Positive  results are indicative of active infection with SARS-CoV-2.  Clinical  correlation with patient history and other diagnostic information is  necessary to determine patient infection status.  Positive results do  not rule out bacterial infection or co-infection with other viruses. If result is PRESUMPTIVE POSTIVE SARS-CoV-2 nucleic acids MAY BE PRESENT.   A presumptive positive result was obtained on the submitted specimen  and confirmed on repeat testing.  While 2019  novel coronavirus  (SARS-CoV-2) nucleic acids may be present in the submitted sample  additional confirmatory testing may be necessary for epidemiological  and / or clinical management purposes  to differentiate between  SARS-CoV-2 and other Sarbecovirus currently known to infect humans.  If clinically indicated additional testing with an alternate test  methodology [id number]) is advised. The SARS-CoV-2 RNA is generally  detectable in upper and lower respiratory sp ecimens during the acute  phase of infection. The expected result is Negative. Fact Sheet for Patients:  StrictlyIdeas.no Fact Sheet for Healthcare Providers: BankingDealers.co.za This test is not yet approved or cleared by the [place] FDA and has been authorized for detection and/or diagnosis of SARS-CoV-2 by FDA under an Emergency Use Authorization (EUA).  This EUA will remain in effect (meaning this test can be used) for the duration of the COVID-19 declaration under Section 564(b)(1) of the Act, 21 U.S.C. section 360bbb-3(b)(1), unless the authorization is terminated or revoked sooner. Performed at [facility], [place] [address], [place],  [address]     Radiology Studies: Dg Chest 2 View  Result Date: 06/04/2018 CLINICAL DATA:  Weakness. EXAM: CHEST - 2 VIEW COMPARISON:  05/18/2018 FINDINGS: Patchy opacity noted right lung base. Interstitial markings are diffusely coarsened with chronic features. Stable asymmetric elevation right hemidiaphragm. The cardiopericardial silhouette is within normal limits for size. The visualized bony structures of the thorax are intact. Status post lower thoracic vertebral augmentation. Telemetry leads overlie the chest. IMPRESSION: Underlying chronic interstitial changes with hazy opacity at the right lung base, suspicious for pneumonia. Electronically Signed  By: Misty Stanley M.D.   On: 05/31/2018 12:50   Ct Head Wo  Contrast  Result Date: 06/15/2018 CLINICAL DATA:  Altered level of consciousness EXAM: CT HEAD WITHOUT CONTRAST TECHNIQUE: Contiguous axial images were obtained from the base of the skull through the vertex without intravenous contrast. COMPARISON:  05/02/2018 FINDINGS: Motion degraded images. Brain: No evidence of acute infarction, hemorrhage, hydrocephalus, extra-axial collection or mass lesion/mass effect. Subcortical white matter and periventricular small vessel ischemic changes. Vascular: Mild intracranial atherosclerosis. Skull: Normal. Negative for fracture or focal lesion. Sinuses/Orbits: The visualized paranasal sinuses are essentially clear. The mastoid air cells are unopacified. Other: None. IMPRESSION: Motion degraded images. No evidence of acute intracranial abnormality. Small vessel ischemic changes. Electronically Signed   By: Julian Hy M.D.   On: 05/23/2018 12:22   Ct Abdomen Pelvis W Contrast  Result Date: 05/20/2018 CLINICAL DATA:  Abdominal pain with nausea vomiting. EXAM: CT ABDOMEN AND PELVIS WITH CONTRAST TECHNIQUE: Multidetector CT imaging of the abdomen and pelvis was performed using the standard protocol following bolus administration of intravenous contrast. CONTRAST:  71mL OMNIPAQUE IOHEXOL 300 MG/ML  SOLN COMPARISON:  To 12/07/2018 FINDINGS: Lower chest: Coronary artery calcification is evident. Emphysema noted in the lung bases. Stable 9 mm ground-glass nodule posterior right lower lobe. Hepatobiliary: New hypervascular 10 mm lesion in the dome of liver (11/2) liver parenchyma heterogeneous. 3.4 cm rim enhancing lesion in the left liver (16/7) is new in the interval. 2.2 cm lesion in the central right liver (23/2). Areas of irregular subcapsular enhancement are seen scattered in the right and left hepatic lobes. Gallbladder surgically absent. No extrahepatic biliary duct dilatation although pneumobilia suggests prior sphincterotomy. Obstructs the right intrahepatic bile ducts  resulting in new dilatation. Pancreas: Diffuse pancreatic atrophy. No dilatation of the main duct. Spleen: Similar appearance 7 mm low-density lesion inferior spleen. Adrenals/Urinary Tract: Right adrenal gland and kidney unremarkable. Patient status post interval left left nephrectomy and adrenalectomy. Plaque-like area of irregular peripherally enhancing soft tissues noted in the left retroperitoneal space, measuring 10 cm in craniocaudal length by 8 cm in AP dimension by 2.7 cm in coronal diameter. This tracks caudally down along the left psoas muscle towards the pelvis. Lateral extent of this soft tissue towards the tail of pancreas appears to occlude the splenic vein. Bladder is partially obscured by artifact from right hip replacement. Stomach/Bowel: Stomach is unremarkable. No gastric wall thickening. No evidence of outlet obstruction. Duodenum is normally positioned as is the ligament of Treitz. No small bowel wall thickening. No small bowel dilatation. Colon is nondilated. Prominent stool volume in the sigmoid colon and rectum. Vascular/Lymphatic: There is abdominal aortic atherosclerosis without aneurysm. No pelvic sidewall lymphadenopathy. Reproductive: The uterus is surgically absent. There is no adnexal mass. Other: Calcified irregular right mesenteric lesion is similar to prior. As mentioned above, there is plaque-like irregular peripherally enhancing soft tissue in the left nephrectomy bed tracks cranio caudally along the psoas muscle. Anteriorly this tracks up along the left side in the anterior to the aorta. Posteriorly it tracks around behind the spleen in the down caudally into the left paracolic gutter (image 16/1) where it may involve the medial wall of the distal descending colon (38/2). As noted above, the soft tissue extends laterally towards the tail of pancreas and appears to obstruct the splenic vein. 14 mm omental nodule identified in the splenic flexure on 31/2. 14 mm nodule seen  anterior to the descending colon on 55/2 Musculoskeletal: Status post right hip replacement. Age  indeterminate fracture of the posterior left eighth rib with possible inferior left scapula fracture noted on image 1 of series 2. IMPRESSION: 1. Interval left nephrectomy and adrenalectomy. There is now a large, irregular, peripherally-enhancing soft tissue process in the left retroperitoneal space, tracking cranio caudally along the psoas muscle, anteriorly up anterior to the aorta and posteriorly behind the spleen and the down into the lower left paracolic gutter. Imaging features are most suggestive of markedly advanced local recurrence of neoplasm. Although dissecting abscess is possible, this is felt to be less likely based on imaging. There is apparent involvement of the distal descending colon and inferior spleen. Lateral extent towards the tail of pancreas obliterates the splenic vein. 2. Interval development of multiple hepatic lesions consistent with metastatic disease. A central right liver lesion obstructs right hepatic bile ducts. 3. 1.4 cm metastatic lesion in the omentum of the splenic flexure with another 1.4 cm nodule adjacent to the descending colon. 4. Stable appearance of the calcified irregular central mesenteric lesion of the right paramidline abdomen. 5. Prominent stool volume distal colon and rectum. 6.  Aortic Atherosclerois (ICD10-170.0) Electronically Signed   By: Misty Stanley M.D.   On: 06/16/2018 15:03   Scheduled Meds:  bisacodyl  10 mg Rectal Daily   Continuous Infusions:  lactated ringers 75 mL/hr at 05/25/18 0841    LOS: 1 day   Kerney Elbe, DO [facility] PAGER is on AMION  If 7PM-7AM, please contact night-coverage [email or web address] Password [email or web address] 05/25/2018, 4:14 PM

## 2018-05-26 DIAGNOSIS — Z515 Encounter for palliative care: Secondary | ICD-10-CM

## 2018-05-26 NOTE — TOC Progression Note (Signed)
Transition of Care [facility]) - Progression Note    Patient Details  Name: [name] MRN: [id number] Date of Birth: 08-11-1932  Transition of Care [facility]) CM/SW [place], [place] Phone Number: 05/26/2018, 10:54 AM  Clinical Narrative:    CSW consulted for disposition needs. Per palliative, patient is transitioning to for comfort with an anticipated hospital death, patient son will be allowed to come in and visit patient despite visitor restrictions. Socail Work will sign off for now, Please re-consult if needs arise.        Expected Discharge Plan and [facility] Death         Expected Discharge Date: (unknown)                                   Social Determinants of Health (SDOH) Interventions    Readmission Risk Interventions Readmission Risk Prevention Plan 05/04/2018 04/15/2018  Transportation Screening Complete Complete  HRI or [place] - Complete  Social Work Consult for [place] Planning/Counseling - Complete  Palliative Care Screening - Not Applicable  Medication Review Press photographer) Complete -  HRI or [place] (No Data) -  SW Recovery Care/Counseling Consult Complete -  Palliative Care Screening Complete -  Skilled Nursing Facility Complete -  Some recent data might be hidden

## 2018-05-26 NOTE — Progress Notes (Signed)
PROGRESS NOTE    [name]  WNI:[id number] DOB: Mar 22, 1932 DOA: 05/27/2018 PCP: [name], MD   Brief Narrative:  HPI per [name] on 06/15/2018 [name] is a 85 y.o. 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  Upon discussion with the son he reported that she slept day and night at home.  She was sent from the nursing home today with complaints of change in mental status and increasing confusion and less responsive with decreased p.o. intake.  No documented fever chills cough nausea vomiting diarrhea or constipation.  History is obtained mainly from the records and patient's son and the ER physician.  Patient unable to provide any history she only responds to pain.  She has history of recurrent falls.  **Interim History  Patient remains noncommunicative but does respond to painful stimuli.  Medical oncology has consulted and recommending comfort care.  Palliative consulted for goals of care discussion and she was continued to be treated until palliative evaluated and they discussed with the family about goals of care and patient was trying to transition to comfort care.  Patient has a high risk for decompensation, worsening and possible inpatient death.  Assessment & Plan:   Active Problems:   Dehydration   Change in mental status   AKI (acute kidney injury) ([place])   Healthcare-associated pneumonia  Acute Change in Mental Status  -Multifactorial secondary to dehydration, pain, possible left lower lobe pneumonia.   -Chest x-ray shows right lung base opacity consistent with pneumonia.  -She was continued on IV Cefepime but this has now stopped given  Transition to [place].   -Will not Check MRSA PCR now.   -Patient received IV Vancomycin and Cefepime in the ER. -Follow-up blood cultures showed NGTD <24 Hours -Patient was admitted with decreased p.o. intake and decreased responsiveness more confused than her baseline.   -Patient had multiple hospital admissions since January 2020 for major surgery including left nephrectomy recurrent falls and dehydration.   -Patient was not awake enough to take anything by mouth and was started on IVF but this has now stopped due to patient being transitioned to Comfort Care\ -Lactic acid 1.5 on admission   -WBC was 18.3 and worsened to 18.9.   -UA shows ketones negative for leukocytes and nitrites.   -COVID was not done due to low suspicion. -After palliative care discussion, all Medicationss not in the patient's comfort have been discontinued including IV antibiotics and IV fluids.  She will be continued on antiseptic oral ins Biotene 15 mL's topically for swish and spit, bisacodyl 10 mg rectal suppositories daily, glycopyrrolate 0.2 mg IV every 4 PRN excessive secretions, IV hydromorphone 0.5 to 1 mg every 1 hour for moderate and severe pain as well as shortness of breath and she also be continued on ipratropium and Xopenex nebs every 6 PRN for wheezing or shortness of breath -Also initiated was lorazepam 0.5 mg IV every 4 PRN for anxiety and ondansetron 4 mg IV every 6 as needed for nausea as well as liquid form tears 1 drop both eyes 4 times daily as needed dry eyes  High-grade Urothelial carcinoma status post left nephrectomy with possible liver mets  -In  February 2020 patient was discharged to [place] was to evaluate for chemotherapy versus immunotherapy versus palliative care to improve her quality of life.   -[name] discussed with son regarding palliative care consult. -He agrees to palliative care consult.  But he reports his mother told him never to put her on hospice.  Dr.  Alen Blew is her oncologist. -Consulted palliative care as Patient has no quality of life and is a candidate for hospice.   -[name] aware of patient admission and recommending the patient be transitioned to comfort care given her limited life expectancy -After palliative care discussion, all Medicationss not in the patient's comfort have been discontinued including IV antibiotics and IV fluids.  She will be continued on antiseptic oral ins Biotene 15 mL's topically for swish and spit, bisacodyl 10 mg rectal suppositories daily, glycopyrrolate 0.2 mg IV every 4 PRN excessive secretions, IV hydromorphone 0.5 to 1 mg every 1 hour for moderate and severe pain as well as shortness of breath and she also be continued on ipratropium and Xopenex nebs every 6 PRN for wheezing or shortness of breath -Also initiated was lorazepam 0.5 mg IV every 4 PRN for anxiety and ondansetron 4 mg IV every 6 as needed for nausea as well as liquid form tears 1 drop both eyes 4 times daily as needed dry eyes  Failure to Thrive with recurrent hospital admissions for dehydration and decreased p.o. intake.   -Dietary consult was initiated but canceled as she is now Comfort Measures  Tachycardia -Secondary to pain and dehydration most likely. -Ordered Tramadol and an LR Bolus and was started on Maintenance IVF with LR at 75 mL/hr but after [place] discussion she is now Comfort Measures and Tramadol has stopped  Hypertension  -Hhydralazine PRN IV now stopped as she is being transitioned to [place]   Dehydration with dry mucous membrane and change in mental status  -Continued IV fluids and gave Bolus this AM but after Palliative Care GOC Discussion with patients Family, she will be transitioned to [place] and is now on 10 mL/hr of LR  AKI  -Improving. Patient's BUN/creatinine went from 36/1.46 now 23/0.96 -Continued with IV fluid hydration but have changed this to LR at 75 mL's per hour instead of normal saline given  her hyperchloremia and hypernatremia; Palliative reduced Rate to 10 mL/hr now given Transition to Comfort Measures  -Avoid nephrotoxic medications if possible along with hypotension as well as contrast dyes -Will not check CMP as patient is being transitioned to Comofort Measures   Abnormal LFTs  -Hhighly likely secondary to liver mets -Mildly improved as AST went from 21 is now 39 and ALT went from 55 is now 47 -Will not check CMP as patient is being transitioned to Comofort Measures   Hypernatermia/Hyperchloremia -Patient's Na+ went from 142 -> 146 and Patient's Chloride went from 104 -> 112 -Change IV fluids from normal saline to lactated Ringer's and given lactated Ringer's bolus of 500 mLs this AM.  -Will not check CMP as patient is being transitioned to Comofort Measures   Constipation  -CT scan showed heavy stool burden  -She was given a Biscaodyu suppository since she is not awake enough to take medications by mouth. -Improved   FOBT positive in the ER  -Hemoglobin/Hct Stable and went from 11.6/37.5 -> 11.6/39.2 -Has a History of colon cancer and metastatic high-grade urothelial cancer -Will not repeat as Patient is being transitioned to Comfort Care   DVT prophylaxis: None  Now that patient is being transitioned to Comfort Measures  Code Status: DO NOT RESUSCITATE  Family Communication: No family present at bedside  Disposition Plan: Patient Transitioned to [place] and has a very high risk of Decompensation   Consultants:   Medical Oncology   Palliative Care Medicine    Procedures: None  Antimicrobials:  Anti-infectives (From admission, onward)   Start     Dose/Rate Route Frequency Ordered Stop   05/28/2018 2200  ceFEPIme (MAXIPIME) 2 g in sodium chloride 0.9 % 100 mL IVPB  Status:  Discontinued     2 g 200 mL/hr over 30 Minutes Intravenous Every 12 hours 06/06/2018 1542 06-07-18 1452   06/06/2018 1415  vancomycin (VANCOCIN) IVPB 1000 mg/200 mL premix     1,000  mg 200 mL/hr over 60 Minutes Intravenous  Once 05/22/2018 1409 05/26/2018 1543   06/01/2018 1415  ceFEPIme (MAXIPIME) 2 g in sodium chloride 0.9 % 100 mL IVPB     2 g 200 mL/hr over 30 Minutes Intravenous  Once 05/28/2018 1409 05/18/2018 1514     Subjective: Seen and examined at bedside and was resting comfortably and did turn her eyes look at me but was nonresponsive.  Appeared comfortable.  Son at bedside and discussed with him about her prognosis and he understands and agrees with comfort care.  No other concerns or complaints at this time and patient is at high risk for increased patient decompensation and likely in-hospital death.  Objective: Vitals:   Jun 07, 2018 0700 06-07-18 1300 07-Jun-2018 2125 05/26/18 0506  BP: (!) 152/80  (!) 147/83 (!) 158/92  Pulse: (!) 114  (!) 121 (!) 122  Resp: 18  14 14   Temp: 97.6 F (36.4 C)  97.6 F (36.4 C) 98.3 F (36.8 C)  TempSrc: Oral  Oral Oral  SpO2: 100% 100% 97% 96%  Weight:      Height:        Intake/Output Summary (Last 24 hours) at 05/26/2018 1357 Last data filed at 05/26/2018 7616 Gross per 24 hour  Intake 0 ml  Output 0 ml  Net 0 ml   Filed Weights   05/20/2018 1035  Weight: 48.4 kg   Examination: Physical Exam:  Constitutional: Thin frail elderly Caucasian female appears calm today and is resting Eyes: Lids extract are normal. Respiratory: Diminished auscultation bilaterally no appreciable wheezing, rales, rhonchi.  She had a little bit of tachypnea. Cardiovascular: Tachycardic rate but regular rhythm. Abdomen: Soft, nontender.  Bowel sounds present GU: Deferred Neurologic: Patient tracks with her eyes and does respond to verbal stimuli and physical stimuli today.  Still withdraws to pain. Psychiatric: Appeared calm  Data Reviewed: I have personally reviewed following labs and imaging studies  CBC: Recent Labs  Lab 06/12/2018 1112 Jun 07, 2018 0554  WBC 18.3* 18.9*  NEUTROABS 15.7*  --   HGB 11.6* 11.6*  HCT 37.5 39.2  MCV 101.6*  102.1*  PLT 559* 073*   Basic Metabolic Panel: Recent Labs  Lab 06/17/2018 1112 Jun 07, 2018 0554  NA 142 146*  K 4.0 3.7  CL 104 112*  CO2 26 22  GLUCOSE 170* 159*  BUN 36* 23  CREATININE 1.46* 0.96  CALCIUM 9.5 9.0   GFR: Estimated Creatinine Clearance: 33.9 mL/min (by C-G formula based on SCr of 0.96 mg/dL). Liver Function Tests: Recent Labs  Lab 06/01/2018 1112 07-Jun-2018 0554  AST 49* 39  ALT 55* 47*  ALKPHOS 1,698* 1,332*  BILITOT 0.9 1.1  PROT 7.6 6.6  ALBUMIN 2.6* 2.3*   No  results for input(s): LIPASE, AMYLASE in the last 168 hours. Recent Labs  Lab 06/09/2018 1117  AMMONIA 29   Coagulation Profile: No results for input(s): INR, PROTIME in the last 168 hours. Cardiac Enzymes: No results for input(s): CKTOTAL, CKMB, CKMBINDEX, TROPONINI in the last 168 hours. BNP (last 3 results) No results for input(s): PROBNP in the last 8760 hours. HbA1C: No results for input(s): HGBA1C in the last 72 hours. CBG: No results for input(s): GLUCAP in the last 168 hours. Lipid Profile: No results for input(s): CHOL, HDL, LDLCALC, TRIG, CHOLHDL, LDLDIRECT in the last 72 hours. Thyroid Function Tests: No results for input(s): TSH, T4TOTAL, FREET4, T3FREE, THYROIDAB in the last 72 hours. Anemia Panel: No results for input(s): VITAMINB12, FOLATE, FERRITIN, TIBC, IRON, RETICCTPCT in the last 72 hours. Sepsis Labs: Recent Labs  Lab 06/02/2018 1117 05/23/2018 1451  LATICACIDVEN 1.5 1.5    Recent Results (from the past 240 hour(s))  Urine culture     Status: None   Collection Time: 06/15/2018  1:03 PM  Result Value Ref Range Status   Specimen Description   Final    URINE, RANDOM Performed at [facility] [address] [place], [place] [address]    Special Requests   Final    NONE Performed at [facility], [place] [address], [place], [place] [address]    Culture   Final    NO GROWTH Performed at [facility], [employer] [employer] [address],  [place], [place] [address]    Report Status 05/25/2018 FINAL  Final  Blood Culture (routine x 2)     Status: None (Preliminary result)   Collection Time: 05/18/2018  2:09 PM  Result Value Ref Range Status   Specimen Description   Final    BLOOD LEFT ARM Performed at [facility] [address], [place], [place] [address]    Special Requests   Final    BOTTLES DRAWN AEROBIC AND ANAEROBIC Blood Culture results may not be optimal due to an inadequate volume of blood received in culture bottles Performed at [facility] [address], [place], [place] [address]    Culture   Final    NO GROWTH < 24 HOURS Performed at [facility] [address], [place], [place] [address]    Report Status PENDING  Incomplete  Blood Culture (routine x 2)     Status: None (Preliminary result)   Collection Time: 05/19/2018  2:14 PM  Result Value Ref Range Status   Specimen Description   Final    BLOOD LEFT ARM Performed at [facility] [address], [place], [place] [address]    Special Requests   Final    BOTTLES DRAWN AEROBIC AND ANAEROBIC Blood Culture results may not be optimal due to an inadequate volume of blood received in culture bottles Performed at [facility] [address], [place], [place] [address]    Culture   Final    NO GROWTH < 24 HOURS Performed at [facility] [address], [place], [place] [address]    Report Status PENDING  Incomplete  SARS Coronavirus 2 (CEPHEID - Performed in [place] [facility] hospital lab), Hosp Order     Status: None   Collection Time: 05/21/2018  4:13 PM  Result Value Ref Range Status   SARS Coronavirus 2 NEGATIVE NEGATIVE Final    Comment: (NOTE) If result is NEGATIVE SARS-CoV-2 target nucleic acids are NOT DETECTED.  The SARS-CoV-2 RNA is generally detectable in upper and lower  respiratory specimens during the acute phase of infection. The lowest  concentration of  SARS-CoV-2 viral copies this assay can detect is 250  copies / mL. A negative result does not preclude SARS-CoV-2 infection  and should not be used as the sole basis for treatment or other  patient management decisions.  A negative result may occur with  improper specimen collection / handling, submission of specimen other  than nasopharyngeal swab, presence of viral mutation(s) within the  areas targeted by this assay, and inadequate number of viral copies  (<250 copies / mL). A negative result must be combined with clinical  observations, patient history, and epidemiological information. If result is POSITIVE SARS-CoV-2 target nucleic acids are DETECTED. The SARS-CoV-2 RNA is generally detectable in upper and lower  respiratory specimens dur ing the acute phase of infection.  Positive  results are indicative of active infection with SARS-CoV-2.  Clinical  correlation with patient history and other diagnostic information is  necessary to determine patient infection status.  Positive results do  not rule out bacterial infection or co-infection with other viruses. If result is PRESUMPTIVE POSTIVE SARS-CoV-2 nucleic acids MAY BE PRESENT.   A presumptive positive result was obtained on the submitted specimen  and confirmed on repeat testing.  While 2019 novel coronavirus  (SARS-CoV-2) nucleic acids may be present in the submitted sample  additional confirmatory testing may be necessary for epidemiological  and / or clinical management purposes  to differentiate between  SARS-CoV-2 and other Sarbecovirus currently known to infect humans.  If clinically indicated additional testing with an alternate test  methodology [phone]) is advised. The SARS-CoV-2 RNA is generally  detectable in upper and lower respiratory sp ecimens during the acute  phase of infection. The expected result is Negative. Fact Sheet for Patients:  StrictlyIdeas.no Fact Sheet for Healthcare  Providers: BankingDealers.co.za This test is not yet approved or cleared by the [place] FDA and has been authorized for detection and/or diagnosis of SARS-CoV-2 by FDA under an Emergency Use Authorization (EUA).  This EUA will remain in effect (meaning this test can be used) for the duration of the COVID-19 declaration under Section 564(b)(1) of the Act, 21 U.S.C. section 360bbb-3(b)(1), unless the authorization is terminated or revoked sooner. Performed at [facility], [place] [address], [place], [place] [address]     Radiology Studies: Ct Abdomen Pelvis W Contrast  Result Date: 06/16/2018 CLINICAL DATA:  Abdominal pain with nausea vomiting. EXAM: CT ABDOMEN AND PELVIS WITH CONTRAST TECHNIQUE: Multidetector CT imaging of the abdomen and pelvis was performed using the standard protocol following bolus administration of intravenous contrast. CONTRAST:  19mL OMNIPAQUE IOHEXOL 300 MG/ML  SOLN COMPARISON:  To 12/07/2018 FINDINGS: Lower chest: Coronary artery calcification is evident. Emphysema noted in the lung bases. Stable 9 mm ground-glass nodule posterior right lower lobe. Hepatobiliary: New hypervascular 10 mm lesion in the dome of liver (11/2) liver parenchyma heterogeneous. 3.4 cm rim enhancing lesion in the left liver (16/7) is new in the interval. 2.2 cm lesion in the central right liver (23/2). Areas of irregular subcapsular enhancement are seen scattered in the right and left hepatic lobes. Gallbladder surgically absent. No extrahepatic biliary duct dilatation although pneumobilia suggests prior sphincterotomy. Obstructs the right intrahepatic bile ducts resulting in new dilatation. Pancreas: Diffuse pancreatic atrophy. No dilatation of the main duct. Spleen: Similar appearance 7 mm low-density lesion inferior spleen. Adrenals/Urinary Tract: Right adrenal gland and kidney unremarkable.  Patient status post interval left left nephrectomy and  adrenalectomy. Plaque-like area of irregular peripherally enhancing soft tissues noted in the left retroperitoneal space, measuring 10 cm in craniocaudal length by 8 cm in AP dimension by 2.7 cm in coronal diameter. This tracks caudally down along the left psoas muscle towards the pelvis. Lateral extent of this soft tissue towards the tail of pancreas appears to occlude the splenic vein. Bladder is partially obscured by artifact from right hip replacement. Stomach/Bowel: Stomach is unremarkable. No gastric wall thickening. No evidence of outlet obstruction. Duodenum is normally positioned as is the ligament of Treitz. No small bowel wall thickening. No small bowel dilatation. Colon is nondilated. Prominent stool volume in the sigmoid colon and rectum. Vascular/Lymphatic: There is abdominal aortic atherosclerosis without aneurysm. No pelvic sidewall lymphadenopathy. Reproductive: The uterus is surgically absent. There is no adnexal mass. Other: Calcified irregular right mesenteric lesion is similar to prior. As mentioned above, there is plaque-like irregular peripherally enhancing soft tissue in the left nephrectomy bed tracks cranio caudally along the psoas muscle. Anteriorly this tracks up along the left side in the anterior to the aorta. Posteriorly it tracks around behind the spleen in the down caudally into the left paracolic gutter (image 16/1) where it may involve the medial wall of the distal descending colon (38/2). As noted above, the soft tissue extends laterally towards the tail of pancreas and appears to obstruct the splenic vein. 14 mm omental nodule identified in the splenic flexure on 31/2. 14 mm nodule seen anterior to the descending colon on 55/2 Musculoskeletal: Status post right hip replacement. Age indeterminate fracture of the posterior left eighth rib with possible inferior left scapula fracture noted on image 1 of series 2. IMPRESSION: 1. Interval left nephrectomy and adrenalectomy. There is  now a large, irregular, peripherally-enhancing soft tissue process in the left retroperitoneal space, tracking cranio caudally along the psoas muscle, anteriorly up anterior to the aorta and posteriorly behind the spleen and the down into the lower left paracolic gutter. Imaging features are most suggestive of markedly advanced local recurrence of neoplasm. Although dissecting abscess is possible, this is felt to be less likely based on imaging. There is apparent involvement of the distal descending colon and inferior spleen. Lateral extent towards the tail of pancreas obliterates the splenic vein. 2. Interval development of multiple hepatic lesions consistent with metastatic disease. A central right liver lesion obstructs right hepatic bile ducts. 3. 1.4 cm metastatic lesion in the omentum of the splenic flexure with another 1.4 cm nodule adjacent to the descending colon. 4. Stable appearance of the calcified irregular central mesenteric lesion of the right paramidline abdomen. 5. Prominent stool volume distal colon and rectum. 6.  Aortic Atherosclerois (ICD10-170.0) Electronically Signed   By: Misty Stanley M.D.   On: 06/01/2018 15:03   Scheduled Meds:  bisacodyl  10 mg Rectal Daily   Continuous Infusions:  lactated ringers 75 mL/hr at 05/25/18 0841    LOS: 2 days   Kerney Elbe, DO [facility] PAGER is on AMION  If 7PM-7AM, please contact night-coverage [email or web address] Password [email or web address] 05/26/2018, 1:57 PM

## 2018-05-27 MED ORDER — LIP MEDEX EX OINT
TOPICAL_OINTMENT | CUTANEOUS | Status: AC
  Administered 2018-05-27: 15:00:00
  Filled 2018-05-27: qty 7

## 2018-05-27 NOTE — Progress Notes (Signed)
PROGRESS NOTE    [name]  QQI:[id number] DOB: 1932/10/21 DOA: 06/05/2018 PCP: [name], MD   Brief Narrative:  HPI per [name] on 06/11/2018 [name] is a 85 y.o. 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  Upon discussion with the son he reported that she slept day and night at home.  She was sent from the nursing home today with complaints of change in mental status and increasing confusion and less responsive with decreased p.o. intake.  No documented fever chills cough nausea vomiting diarrhea or constipation.  History is obtained mainly from the records and patient's son and the ER physician.  Patient unable to provide any history she only responds to pain.  She has history of recurrent falls.  **Interim History  Patient remains noncommunicative but does respond to painful stimuli.  Medical oncology has consulted and recommending comfort care.  Palliative consulted for goals of care discussion and she was continued to be treated until palliative evaluated and they discussed with the family about goals of care and patient was trying to transition to comfort care.  Patient has a high risk for decompensation, worsening and possible inpatient death.  Assessment & Plan:   Active Problems:   Dehydration   Change in mental status   AKI (acute kidney injury) ([place])   Healthcare-associated pneumonia  Acute Change in Mental Status  -Multifactorial secondary to dehydration, pain, possible left lower lobe pneumonia.   -Chest x-ray shows right lung base opacity consistent with pneumonia.  -She was continued on IV Cefepime but this has now stopped given  Transition to [facility].   -Will not Check MRSA PCR now.   -Patient received IV Vancomycin and Cefepime in the ER. -Follow-up blood cultures showed NGTD <24 Hours -Patient was admitted with decreased p.o. intake and decreased responsiveness more confused than her baseline.   -Patient had multiple hospital admissions since January 2020 for major surgery including left nephrectomy recurrent falls and dehydration.   -Patient was not awake enough to take anything by mouth and was started on IVF but this has now stopped due to patient being transitioned to Comfort Care\ -Lactic acid 1.5 on admission   -WBC was 18.3 and worsened to 18.9.   -UA shows ketones negative for leukocytes and nitrites.   -COVID was not done due to low suspicion. -After palliative care discussion, all Medicationss not in the patient's comfort have been discontinued including IV antibiotics and IV fluids.  She will be continued on antiseptic oral ins Biotene 15 mL's topically for swish and spit, bisacodyl 10 mg rectal suppositories daily, glycopyrrolate 0.2 mg IV every 4 PRN excessive secretions, IV hydromorphone 0.5 to 1 mg every 1 hour for moderate and severe pain as well as shortness of breath and she also be continued on ipratropium and Xopenex nebs every 6 PRN for wheezing or shortness of breath -Also initiated was lorazepam 0.5 mg IV every 4 PRN for anxiety and ondansetron 4 mg IV every 6 as needed for nausea as well as liquid form tears 1 drop both eyes 4 times daily as needed dry eyes  High-grade Urothelial carcinoma status post left nephrectomy with possible liver mets  -In  February 2020 patient was discharged to [facility] was to evaluate for chemotherapy versus immunotherapy versus palliative care to improve her quality of life.   -[name] discussed with son regarding palliative care consult. -He agrees to palliative care consult.  But he reports his mother told him never to put her on hospice.  Dr.  Alen Blew is her oncologist. -Consulted palliative care as Patient has no quality of life and is a candidate for hospice.   -[name] aware of patient admission and recommending the patient be transitioned to comfort care given her limited life expectancy -After palliative care discussion, all Medicationss not in the patient's comfort have been discontinued including IV antibiotics and IV fluids.  She will be continued on antiseptic oral ins Biotene 15 mL's topically for swish and spit, bisacodyl 10 mg rectal suppositories daily, glycopyrrolate 0.2 mg IV every 4 PRN excessive secretions, IV hydromorphone 0.5 to 1 mg every 1 hour for moderate and severe pain as well as shortness of breath and she also be continued on ipratropium and Xopenex nebs every 6 PRN for wheezing or shortness of breath -Also initiated was lorazepam 0.5 mg IV every 4 PRN for anxiety and ondansetron 4 mg IV every 6 as needed for nausea as well as liquid form tears 1 drop both eyes 4 times daily as needed dry eyes  Failure to Thrive with recurrent hospital admissions for dehydration and decreased p.o. intake.   -Dietary consult was initiated but canceled as she is now Comfort Measures  Tachycardia -Secondary to pain and dehydration most likely. -Ordered Tramadol and an LR Bolus and was started on Maintenance IVF with LR at 75 mL/hr but after [facility] discussion she is now Comfort Measures and Tramadol has stopped  Hypertension  -Hhydralazine PRN IV now stopped as she is being transitioned to [facility]   Dehydration with dry mucous membrane and change in mental status  -Continued IV fluids and gave Bolus this AM but after Palliative Care GOC Discussion with patients Family, she will be transitioned to [facility] and is now on 10 mL/hr of LR  AKI  -Improving. Patient's BUN/creatinine went from 36/1.46 now 23/0.96 -Continued with IV fluid hydration but have changed this to LR at 75 mL's per hour instead of normal saline given  her hyperchloremia and hypernatremia; Palliative reduced Rate to 10 mL/hr now given Transition to Comfort Measures  -Avoid nephrotoxic medications if possible along with hypotension as well as contrast dyes -Will not check CMP as patient is being transitioned to Comofort Measures   Abnormal LFTs  -Hhighly likely secondary to liver mets -Mildly improved as AST went from 78 is now 39 and ALT went from 55 is now 47 -Will not check CMP as patient is being transitioned to Comofort Measures   Hypernatermia/Hyperchloremia -Patient's Na+ went from 142 -> 146 and Patient's Chloride went from 104 -> 112 -Change IV fluids from normal saline to lactated Ringer's and given lactated Ringer's bolus of 500 mLs this AM.  -Will not check CMP as patient is being transitioned to Comofort Measures   Constipation  -CT scan showed heavy stool burden  -She was given a Biscaodyu suppository since she is not awake enough to take medications by mouth. -Improved   FOBT positive in the ER  -Hemoglobin/Hct Stable and went from 11.6/37.5 -> 11.6/39.2 -Has a History of colon cancer and metastatic high-grade urothelial cancer -Will not repeat as Patient is being transitioned to Comfort Care   DVT prophylaxis: None  Now that patient is being transitioned to Comfort Measures  Code Status: DO NOT RESUSCITATE  Family Communication: No family present at bedside  Disposition Plan: Patient Transitioned to [facility] and has a very high risk of Decompensation and likely chance of in hospital death  Consultants:   Medical Oncology   Palliative Care Medicine    Procedures: None  Antimicrobials:  Anti-infectives (From admission, onward)   Start     Dose/Rate Route Frequency Ordered Stop   05/26/2018 2200  ceFEPIme (MAXIPIME) 2 g in sodium chloride 0.9 % 100 mL IVPB  Status:  Discontinued     2 g 200 mL/hr over 30 Minutes Intravenous Every 12 hours 06/05/2018 1542 05/25/18 1452   06/14/2018 1415  vancomycin (VANCOCIN)  IVPB 1000 mg/200 mL premix     1,000 mg 200 mL/hr over 60 Minutes Intravenous  Once 05/27/2018 1409 05/28/2018 1543   06/06/2018 1415  ceFEPIme (MAXIPIME) 2 g in sodium chloride 0.9 % 100 mL IVPB     2 g 200 mL/hr over 30 Minutes Intravenous  Once 06/11/2018 1409 06/03/2018 1514     Subjective: Seen and examined at bedside and is again resting comfortably and did track me with her eyes but still is nonresponsive.  Did grimace to pain a little bit.  No other concerns or complaints at this time and son feels she is the same as yesterday.  I tried to broach the subject of residential hospice with the patient's son however he declined at this time and will need to have further palliative discussion about transitioning her to a residential hospice.  Objective: Vitals:   05/25/18 2125 05/26/18 0506 05/27/18 0550 05/27/18 1102  BP: (!) 147/83 (!) 158/92 (!) 141/82   Pulse: (!) 121 (!) 122 (!) 126   Resp: 14 14 15    Temp: 97.6 F (36.4 C) 98.3 F (36.8 C) 98.5 F (36.9 C)   TempSrc: Oral Oral Oral   SpO2: 97% 96% 94% 94%  Weight:      Height:        Intake/Output Summary (Last 24 hours) at 05/27/2018 1422 Last data filed at 05/27/2018 0600 Gross per 24 hour  Intake 0 ml  Output -  Net 0 ml   Filed Weights   06/08/2018 1035  Weight: 48.4 kg   Examination: Physical Exam:  Constitutional: Thin frail elderly female appears calm and comfortable Eyes: Lids and conjunctive are normal Respiratory: Diminished to auscultation bilaterally no appreciable wheezing, rales, rhonchi but did have coarse breath sounds and was mildly tachycardia Cardiovascular: Tachycardic rate but regular rhythm Abdomen: Soft, nontender, bowel sounds present GU: Deferred Neurologic: Tracks with her eyes and does respond to verbal stimuli along with physical stimuli and still withdraws to pain Psychiatric:.  Calm laying in the bed  Data Reviewed: I have personally reviewed following labs and imaging studies  CBC: Recent  Labs  Lab 05/18/2018 1112 05/25/18 0554  WBC 18.3* 18.9*  NEUTROABS 15.7*  --   HGB 11.6* 11.6*  HCT 37.5 39.2  MCV 101.6* 102.1*  PLT 559* 160*   Basic Metabolic Panel: Recent Labs  Lab 05/26/2018 1112 05/25/18 0554  NA 142 146*  K 4.0 3.7  CL 104 112*  CO2 26 22  GLUCOSE 170* 159*  BUN 36* 23  CREATININE 1.46* 0.96  CALCIUM 9.5 9.0   GFR: Estimated Creatinine Clearance: 33.9 mL/min (by C-G formula based on SCr of 0.96 mg/dL). Liver Function Tests: Recent Labs  Lab 06/16/2018 1112 05/25/18 0554  AST  49* 39  ALT 55* 47*  ALKPHOS 1,698* 1,332*  BILITOT 0.9 1.1  PROT 7.6 6.6  ALBUMIN 2.6* 2.3*   No results for input(s): LIPASE, AMYLASE in the last 168 hours. Recent Labs  Lab 06/10/2018 1117  AMMONIA 29   Coagulation Profile: No results for input(s): INR, PROTIME in the last 168 hours. Cardiac Enzymes: No results for input(s): CKTOTAL, CKMB, CKMBINDEX, TROPONINI in the last 168 hours. BNP (last 3 results) No results for input(s): PROBNP in the last 8760 hours. HbA1C: No results for input(s): HGBA1C in the last 72 hours. CBG: No results for input(s): GLUCAP in the last 168 hours. Lipid Profile: No results for input(s): CHOL, HDL, LDLCALC, TRIG, CHOLHDL, LDLDIRECT in the last 72 hours. Thyroid Function Tests: No results for input(s): TSH, T4TOTAL, FREET4, T3FREE, THYROIDAB in the last 72 hours. Anemia Panel: No results for input(s): VITAMINB12, FOLATE, FERRITIN, TIBC, IRON, RETICCTPCT in the last 72 hours. Sepsis Labs: Recent Labs  Lab 06/03/2018 1117 06/10/2018 1451  LATICACIDVEN 1.5 1.5    Recent Results (from the past 240 hour(s))  Urine culture     Status: None   Collection Time: 05/28/2018  1:03 PM  Result Value Ref Range Status   Specimen Description   Final    URINE, RANDOM Performed at [facility] [address] [place], [place] [address]    Special Requests   Final    NONE Performed at [facility], [place]  [address], [place], [place] [address]    Culture   Final    NO GROWTH Performed at [facility], [place] [address], [place], [place] [address]    Report Status 05/25/2018 FINAL  Final  Blood Culture (routine x 2)     Status: None (Preliminary result)   Collection Time: 05/27/2018  2:09 PM  Result Value Ref Range Status   Specimen Description   Final    BLOOD LEFT ARM Performed at [facility], [place] [address] [place], [place] [address]    Special Requests   Final    BOTTLES DRAWN AEROBIC AND ANAEROBIC Blood Culture results may not be optimal due to an inadequate volume of blood received in culture bottles Performed at [facility] [address] [place], [place] [address]    Culture   Final    NO GROWTH 3 DAYS Performed at [facility], [place] [address], [place], [place] [address]    Report Status PENDING  Incomplete  Blood Culture (routine x 2)     Status: None (Preliminary result)   Collection Time: 05/26/2018  2:14 PM  Result Value Ref Range Status   Specimen Description   Final    BLOOD LEFT ARM Performed at [facility], [place] [address] [place], [place] [address]    Special Requests   Final    BOTTLES DRAWN AEROBIC AND ANAEROBIC Blood Culture results may not be optimal due to an inadequate volume of blood received in culture bottles Performed at [facility] [address] [place], [place] [address]    Culture   Final    NO GROWTH 3 DAYS Performed at [facility], [place] [address], [place], [place] [address]    Report Status PENDING  Incomplete  SARS Coronavirus 2 (CEPHEID - Performed in [facility] hospital lab), Hosp Order     Status: None   Collection Time: 05/20/2018  4:13 PM  Result Value Ref Range Status  SARS Coronavirus 2 NEGATIVE NEGATIVE Final    Comment: (NOTE) If result is NEGATIVE SARS-CoV-2 target nucleic acids are NOT DETECTED. The SARS-CoV-2 RNA is generally detectable in upper and lower   respiratory specimens during the acute phase of infection. The lowest  concentration of SARS-CoV-2 viral copies this assay can detect is 250  copies / mL. A negative result does not preclude SARS-CoV-2 infection  and should not be used as the sole basis for treatment or other  patient management decisions.  A negative result may occur with  improper specimen collection / handling, submission of specimen other  than nasopharyngeal swab, presence of viral mutation(s) within the  areas targeted by this assay, and inadequate number of viral copies  (<250 copies / mL). A negative result must be combined with clinical  observations, patient history, and epidemiological information. If result is POSITIVE SARS-CoV-2 target nucleic acids are DETECTED. The SARS-CoV-2 RNA is generally detectable in upper and lower  respiratory specimens dur ing the acute phase of infection.  Positive  results are indicative of active infection with SARS-CoV-2.  Clinical  correlation with patient history and other diagnostic information is  necessary to determine patient infection status.  Positive results do  not rule out bacterial infection or co-infection with other viruses. If result is PRESUMPTIVE POSTIVE SARS-CoV-2 nucleic acids MAY BE PRESENT.   A presumptive positive result was obtained on the submitted specimen  and confirmed on repeat testing.  While 2019 novel coronavirus  (SARS-CoV-2) nucleic acids may be present in the submitted sample  additional confirmatory testing may be necessary for epidemiological  and / or clinical management purposes  to differentiate between  SARS-CoV-2 and other Sarbecovirus currently known to infect humans.  If clinically indicated additional testing with an alternate test  methodology [phone]) is advised. The SARS-CoV-2 RNA is generally  detectable in upper and lower respiratory sp ecimens during the acute  phase of infection. The expected result is Negative. Fact  Sheet for Patients:  StrictlyIdeas.no Fact Sheet for Healthcare Providers: BankingDealers.co.za This test is not yet approved or cleared by the [place] FDA and has been authorized for detection and/or diagnosis of SARS-CoV-2 by FDA under an Emergency Use Authorization (EUA).  This EUA will remain in effect (meaning this test can be used) for the duration of the COVID-19 declaration under Section 564(b)(1) of the Act, 21 U.S.C. section 360bbb-3(b)(1), unless the authorization is terminated or revoked sooner. Performed at [facility], [place] [address], [place], [place] [address]     Radiology Studies: No results found. Scheduled Meds: . bisacodyl  10 mg Rectal Daily   Continuous Infusions: . lactated ringers 75 mL/hr at 05/25/18 0841    LOS: 3 days   Kerney Elbe, DO [facility] PAGER is on AMION  If 7PM-7AM, please contact night-coverage [email or web address] Password [email or web address] 05/27/2018, 2:22 PM

## 2018-05-28 DIAGNOSIS — C7910 Secondary malignant neoplasm of unspecified urinary organs: Secondary | ICD-10-CM

## 2018-05-28 NOTE — Progress Notes (Signed)
PROGRESS NOTE    WILLO YOON  BJS:[id number] DOB: 1932/02/19 DOA: 05/27/2018 PCP: [name], MD   Brief Narrative:  HPI per [name] on 05/28/2018 NIKEA SETTLE is a 85 y.o. 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  Upon discussion with the son he reported that she slept day and night at home.  She was sent from the nursing home today with complaints of change in mental status and increasing confusion and less responsive with decreased p.o. intake.  No documented fever chills cough nausea vomiting diarrhea or constipation.  History is obtained mainly from the records and patient's son and the ER physician.  Patient unable to provide any history she only responds to pain.  She has history of recurrent falls.  **Interim History  Patient remained non-communicative and respond to painful stimuli but is now not very responsice.  Medical oncology has consulted and recommending comfort care.  Palliative consulted for goals of care discussion and she was continued to be treated until palliative evaluated and they discussed with the family about goals of care and patient was trying to transition to comfort care.  Patient has a high risk for decompensation, worsening and possible inpatient death.   Assessment & Plan:   Active Problems:   Dehydration   Change in mental status   AKI (acute kidney injury) ([place])   Healthcare-associated pneumonia  Acute Change in Mental Status  -Multifactorial secondary to dehydration, pain, possible left lower lobe pneumonia.   -Chest x-ray shows right lung base opacity consistent with pneumonia.  -She was continued on IV Cefepime but  this has now stopped given Transition to [place].   -Will not Check MRSA PCR now.   -Patient received IV Vancomycin and Cefepime in the ER. -Follow-up blood cultures showed NGTD <24 Hours -Patient was admitted with decreased p.o. intake and decreased responsiveness more confused than her baseline.   -Patient had multiple hospital admissions since January 2020 for major surgery including left nephrectomy recurrent falls and dehydration.   -Patient was not awake enough to take anything by mouth and was started on IVF but this has now stopped due to patient being transitioned to Comfort Care\ -Lactic acid 1.5 on admission   -WBC was 18.3 and worsened to 18.9.   -UA shows ketones negative for leukocytes and nitrites.   -COVID was not done due to low suspicion. -After palliative care discussion, all Medicationss not in the patient's comfort have been discontinued including IV antibiotics and IV fluids.  She will be continued on antiseptic oral ins Biotene 15 mL's topically for swish and spit, bisacodyl 10 mg rectal suppositories daily, glycopyrrolate 0.2 mg IV every 4 PRN excessive secretions, IV hydromorphone 0.5 to 1 mg every 1 hour for moderate and severe pain as well as shortness of breath and she also be continued on ipratropium and Xopenex nebs every 6 PRN for wheezing or shortness of breath -Also initiated was lorazepam 0.5 mg IV every 4 PRN for anxiety and ondansetron 4 mg IV every 6 as needed for nausea as well as liquid form tears 1 drop both eyes 4 times daily as needed dry eyes -Because patient had some labored breathing today she  was given a dose of Dilaudid this morning.  She started having more shallow breathing and tachypnea and the Dilaudid helped and she is now calm and nonresponsive.  High-grade Urothelial carcinoma status post left nephrectomy with possible liver mets  -In February 2020 patient was discharged to [facility] was to evaluate for chemotherapy versus  immunotherapy versus palliative care to improve her quality of life.   -[name] discussed with son regarding palliative care consult. -He agrees to palliative care consult.  But he reports his mother told him never to put her on hospice.  [name] is her oncologist. -Consulted palliative care as Patient has no quality of life and is a candidate for hospice.   -[name] aware of patient admission and recommending the patient be transitioned to comfort care given her limited life expectancy -After palliative care discussion, all Medicationss not in the patient's comfort have been discontinued including IV antibiotics and IV fluids.  She will be continued on antiseptic oral ins Biotene 15 mL's topically for swish and spit, bisacodyl 10 mg rectal suppositories daily, glycopyrrolate 0.2 mg IV every 4 PRN excessive secretions, IV hydromorphone 0.5 to 1 mg every 1 hour for moderate and severe pain as well as shortness of breath and she also be continued on ipratropium and Xopenex nebs every 6 PRN for wheezing or shortness of breath -Also initiated was lorazepam 0.5 mg IV every 4 PRN for anxiety and ondansetron 4 mg IV every 6 as needed for nausea as well as liquid form tears 1 drop both eyes 4 times daily as needed dry eyes  Failure to Thrive with recurrent hospital admissions for dehydration and decreased p.o. intake.   -Dietary consult was initiated but canceled as she is now Comfort Measures  Tachycardia -Secondary to pain and dehydration most likely. -Ordered Tramadol and an LR Bolus and was started on Maintenance IVF with LR at 75 mL/hr but after [place] discussion she is now Comfort Measures and Tramadol has stopped  Hypertension  -Hhydralazine PRN IV now stopped as she is being transitioned to [place]   Dehydration with dry mucous membrane and change in mental status  -Continued IV fluids and gave Bolus this AM but after Palliative Care GOC Discussion with patients Family, she will be  transitioned to [place] and is now on 10 mL/hr of LR  AKI  -Improving. Patient's BUN/creatinine went from 36/1.46 now 23/0.96 -Continued with IV fluid hydration but have changed this to LR at 75 mL's per hour instead of normal saline given her hyperchloremia and hypernatremia; Palliative reduced Rate to 10 mL/hr now given Transition to Comfort Measures  -Avoid nephrotoxic medications if possible along with hypotension as well as contrast dyes -Will not check CMP as patient is being transitioned to Comofort Measures   Abnormal LFTs  -Hhighly likely secondary to liver mets -Mildly improved as AST went from 59 is now 39 and ALT went from 55 is now 47 -Will not check CMP as patient is being transitioned to Comofort Measures   Hypernatermia/Hyperchloremia -Patient's Na+ went from 142 -> 146 and Patient's Chloride went from 104 -> 112 -Change IV fluids from normal saline to lactated Ringer's and given lactated Ringer's bolus of 500 mLs this AM.  -Will not check CMP as patient is being transitioned to Comofort Measures   Constipation  -CT scan showed heavy stool burden  -She was given a Biscaodyu suppository since she is not awake enough to take medications by mouth. -Improved  FOBT positive in the ER  -Hemoglobin/Hct Stable and went from 11.6/37.5 -> 11.6/39.2 -Has a History of colon cancer and metastatic high-grade urothelial cancer -Will not repeat as Patient is being transitioned to [place]   DVT prophylaxis: None Now that patient is being transitioned to Comfort Measures  Code Status: DO NOT RESUSCITATE  Family Communication: Discussed with her son [name] at bedside Disposition Plan: Patient Transitioned to [facility] and has a very high risk of Decompensation and likely chance of in hospital death  Consultants:   Medical Oncology   Palliative Care Medicine    Procedures: None  Antimicrobials:  Anti-infectives (From admission, onward)   Start     Dose/Rate  Route Frequency Ordered Stop   06/06/2018 2200  ceFEPIme (MAXIPIME) 2 g in sodium chloride 0.9 % 100 mL IVPB  Status:  Discontinued     2 g 200 mL/hr over 30 Minutes Intravenous Every 12 hours 05/23/2018 1542 05/25/18 1452   05/28/2018 1415  vancomycin (VANCOCIN) IVPB 1000 mg/200 mL premix     1,000 mg 200 mL/hr over 60 Minutes Intravenous  Once 06/05/2018 1409 06/07/2018 1543   05/18/2018 1415  ceFEPIme (MAXIPIME) 2 g in sodium chloride 0.9 % 100 mL IVPB     2 g 200 mL/hr over 30 Minutes Intravenous  Once 06/11/2018 1409 05/26/2018 1514     Subjective: Seen and examined at bedside and this morning she had some labored breathing and shallow breathing and was given a dose of hydromorphone by the palliative team.  She appeared more comfortable on she is now nonresponsive to even physical stimuli.  She had her eyes closed and has some shallow breathing.  She is unsafe to transport back to a skilled nursing facility or hospice house and I discussed with the son that she may not have much more time and inpatient hospital death is likely imminent.  Objective: Vitals:   05/27/18 0550 05/27/18 1102 05/27/18 1907 05/28/18 0624  BP: (!) 141/82  (!) 107/59 110/64  Pulse: (!) 126  (!) 149 (!) 140  Resp: 15  (!) 36 (!) 28  Temp: 98.5 F (36.9 C)  98.2 F (36.8 C) 98.2 F (36.8 C)  TempSrc: Oral   Oral  SpO2: 94% 94% 95% 92%  Weight:      Height:       No intake or output data in the 24 hours ending 05/28/18 1103 Filed Weights   06/12/2018 1035  Weight: 48.4 kg   Examination: Physical Exam:  Constitutional: Thin frail elderly female appearing calm and comfortable with shallower breathing today Eyes: Lids are normal and she had her eyes closed Respiratory: Diminished auscultation bilaterally with no appreciable wheezing, rales or rhonchi.  She did have some tachypnea as well as short shallow breathing Cardiovascular: Moderately tachycardic with a regular rhythm. Abdomen: Soft, nontender, bowel sounds  present GU: Deferred Neurologic: Did not open her eyes or respond to verbal or physical stimuli today.  Did not really withdraw to pain Psychiatric:. She is calm and nonresponsive now even to physical stimuli  Data Reviewed: I have personally reviewed following labs and imaging studies  CBC: Recent Labs  Lab 06/16/2018 1112 05/25/18 0554  WBC 18.3* 18.9*  NEUTROABS 15.7*  --   HGB 11.6* 11.6*  HCT 37.5 39.2  MCV 101.6* 102.1*  PLT 559* 093*   Basic Metabolic Panel: Recent Labs  Lab 06/01/2018 1112 05/25/18 0554  NA 142 146*  K 4.0 3.7  CL 104 112*  CO2 26 22  GLUCOSE 170* 159*  BUN 36* 23  CREATININE 1.46* 0.96  CALCIUM 9.5 9.0   GFR: Estimated Creatinine Clearance: 33.9 mL/min (by C-G formula based on SCr of 0.96 mg/dL). Liver Function Tests: Recent Labs  Lab 06/11/2018 1112 05/25/18 0554  AST 49* 39  ALT 55* 47*  ALKPHOS 1,698* 1,332*  BILITOT 0.9 1.1  PROT 7.6 6.6  ALBUMIN 2.6* 2.3*   No results for input(s): LIPASE, AMYLASE in the last 168 hours. Recent Labs  Lab 06/10/2018 1117  AMMONIA 29   Coagulation Profile: No results for input(s): INR, PROTIME in the last 168 hours. Cardiac Enzymes: No results for input(s): CKTOTAL, CKMB, CKMBINDEX, TROPONINI in the last 168 hours. BNP (last 3 results) No results for input(s): PROBNP in the last 8760 hours. HbA1C: No results for input(s): HGBA1C in the last 72 hours. CBG: No results for input(s): GLUCAP in the last 168 hours. Lipid Profile: No results for input(s): CHOL, HDL, LDLCALC, TRIG, CHOLHDL, LDLDIRECT in the last 72 hours. Thyroid Function Tests: No results for input(s): TSH, T4TOTAL, FREET4, T3FREE, THYROIDAB in the last 72 hours. Anemia Panel: No results for input(s): VITAMINB12, FOLATE, FERRITIN, TIBC, IRON, RETICCTPCT in the last 72 hours. Sepsis Labs: Recent Labs  Lab 05/18/2018 1117 06/13/2018 1451  LATICACIDVEN 1.5 1.5    Recent Results (from the past 240 hour(s))  Urine culture     Status:  None   Collection Time: 06/02/2018  1:03 PM  Result Value Ref Range Status   Specimen Description   Final    URINE, RANDOM Performed at [facility] [address] [place], [place] [address]    Special Requests   Final    NONE Performed at [facility], [place] [address] [place], [place] [address]    Culture   Final    NO GROWTH Performed at [facility], [place] [address], [place], [place] [address]    Report Status 05/25/2018 FINAL  Final  Blood Culture (routine x 2)     Status: None (Preliminary result)   Collection Time: 05/31/2018  2:09 PM  Result Value Ref Range Status   Specimen Description   Final    BLOOD LEFT ARM Performed at [facility], [place] [address], [place], [place] [address]    Special Requests   Final    BOTTLES DRAWN AEROBIC AND ANAEROBIC Blood Culture results may not be optimal due to an inadequate volume of blood received in culture bottles Performed at [facility] [address], [place], [place] [address]    Culture   Final    NO GROWTH 4 DAYS Performed at [facility], [place] [address] [place], [place] [address]    Report Status PENDING  Incomplete  Blood Culture (routine x 2)     Status: None (Preliminary result)   Collection Time: 06/01/2018  2:14 PM  Result Value Ref Range Status   Specimen Description   Final    BLOOD LEFT ARM Performed at [facility], [place] [address] [place], [place] [address]    Special Requests   Final    BOTTLES DRAWN AEROBIC AND ANAEROBIC Blood Culture results may not be optimal due to an inadequate volume of blood received in culture bottles Performed at [facility] [address] [place],  [address]    Culture   Final    NO GROWTH 4 DAYS Performed at [facility], [place],  [place] [address]    Report Status PENDING  Incomplete  SARS Coronavirus 2 (CEPHEID - Performed in [facility] hospital  lab), Hosp Order     Status: None   Collection Time: 06/13/2018  4:13 PM  Result Value Ref Range Status   SARS Coronavirus 2 NEGATIVE NEGATIVE Final    Comment: (NOTE) If result is NEGATIVE SARS-CoV-2 target nucleic acids are NOT DETECTED. The SARS-CoV-2 RNA is generally detectable in upper and lower  respiratory specimens during the acute phase of infection. The lowest  concentration of SARS-CoV-2 viral copies this assay can detect is 250  copies / mL. A negative result does not preclude SARS-CoV-2 infection  and should not be used as the sole basis for treatment or other  patient management decisions.  A negative result may occur with  improper specimen collection / handling, submission of specimen other  than nasopharyngeal swab, presence of viral mutation(s) within the  areas targeted by this assay, and inadequate number of viral copies  (<250 copies / mL). A negative result must be combined with clinical  observations, patient history, and epidemiological information. If result is POSITIVE SARS-CoV-2 target nucleic acids are DETECTED. The SARS-CoV-2 RNA is generally detectable in upper and lower  respiratory specimens dur ing the acute phase of infection.  Positive  results are indicative of active infection with SARS-CoV-2.  Clinical  correlation with patient history and other diagnostic information is  necessary to determine patient infection status.  Positive results do  not rule out bacterial infection or co-infection with other viruses. If result is PRESUMPTIVE POSTIVE SARS-CoV-2 nucleic acids MAY BE PRESENT.   A presumptive positive result was obtained on the submitted specimen  and confirmed on repeat testing.  While 2019 novel coronavirus  (SARS-CoV-2) nucleic acids may be present in the submitted sample  additional confirmatory testing may be necessary for epidemiological  and / or clinical management purposes  to differentiate between  SARS-CoV-2 and other Sarbecovirus  currently known to infect humans.  If clinically indicated additional testing with an alternate test  methodology [phone]) is advised. The SARS-CoV-2 RNA is generally  detectable in upper and lower respiratory sp ecimens during the acute  phase of infection. The expected result is Negative. Fact Sheet for Patients:  StrictlyIdeas.no Fact Sheet for Healthcare Providers: BankingDealers.co.za This test is not yet approved or cleared by the [place] FDA and has been authorized for detection and/or diagnosis of SARS-CoV-2 by FDA under an Emergency Use Authorization (EUA).  This EUA will remain in effect (meaning this test can be used) for the duration of the COVID-19 declaration under Section 564(b)(1) of the Act, 21 U.S.C. section 360bbb-3(b)(1), unless the authorization is terminated or revoked sooner. Performed at [facility], [place] [address] [place], [place] [address]     Radiology Studies: No results found. Scheduled Meds: . bisacodyl  10 mg Rectal Daily   Continuous Infusions: . lactated ringers 75 mL/hr at 05/25/18 0841    LOS: 4 days   Kerney Elbe, DO [facility] PAGER is on AMION  If 7PM-7AM, please contact night-coverage [email or web address] Password [facility] 05/28/2018, 11:03 AM

## 2018-05-28 NOTE — Progress Notes (Signed)
Daily Progress Note   Patient Name: [name]       Date: 05/28/2018 DOB: 10-29-1932  Age: 85 y.o. MRN#: [id number] Attending Physician: Kerney Elbe, DO Primary Care Physician: [name], MD Admit Date: 06/02/2018  Reason for Consultation/Follow-up: Establishing goals of care and Terminal Care  Subjective:  Patient lethargic. Respirations shallow but with tachypnea. Instructed RN to give prn dilaudid. Oral care performed.   Son, [name], arrives to bedside. Discussed plan for comfort in order to allow comfort, peace, dignity at EOL. Educated on EOL expectations and medications as needed to ensure comfort. Explained poor prognosis and prepared [name] for 'anything to happen at any time' and that she appears to be getting closer to EOL. [name] states that his mother would NOT want to die in a hospice facility. Explained that I have discussed with attending and we feel it is best to leave her in the hospital for EOL care. Answered questions and concerns. Emotional support provided.   Length of Stay: 4  Current Medications: Scheduled Meds:  . bisacodyl  10 mg Rectal Daily    Continuous Infusions: . lactated ringers 75 mL/hr at 05/25/18 0841    PRN Meds: antiseptic oral rinse, glycopyrrolate, HYDROmorphone (DILAUDID) injection, ipratropium, levalbuterol, LORazepam, ondansetron (ZOFRAN) IV, polyvinyl alcohol  Physical Exam Vitals signs and nursing note reviewed.  Constitutional:      Appearance: She is cachectic. She is ill-appearing.  HENT:     Head: Normocephalic and atraumatic.  Cardiovascular:     Rate and Rhythm: Tachycardia present.  Pulmonary:     Effort: Tachypnea present. No accessory muscle usage or respiratory distress.     Breath sounds: Decreased breath  sounds present.     Comments: RN to give prn dilaudid for dyspnea/tachypnea Skin:    General: Skin is warm and dry.     Coloration: Skin is pale.  Neurological:     Mental Status: She is lethargic.           Vital Signs: BP 110/64 (BP Location: Right Arm)   Pulse (!) 140   Temp 98.2 F (36.8 C) (Oral)   Resp (!) 28   Ht 5\' 5"  (1.651 m)   Wt 48.4 kg   LMP 01/17/1994   SpO2 92%   BMI 17.76 kg/m  SpO2: SpO2: 92 % O2  Device: O2 Device: Room Air O2 Flow Rate:    Intake/output summary: No intake or output data in the 24 hours ending 05/28/18 0849 LBM: Last BM Date: 06/11/2018 Baseline Weight: Weight: 48.4 kg Most recent weight: Weight: 48.4 kg       Palliative Assessment/Data: PPS 10%    Flowsheet Rows     Most Recent Value  Intake Tab  Referral Department  Hospitalist  Unit at Time of Referral  ER  Palliative Care Primary Diagnosis  Cancer  Date Notified  05/31/2018  Palliative Care Type  Return patient Palliative Care  Reason for referral  Clarify Goals of Care  Date of Admission  06/13/2018  # of days IP prior to Palliative referral  0  Clinical Assessment  Psychosocial & Spiritual Assessment  Palliative Care Outcomes      Patient Active Problem List   Diagnosis Date Noted  . Change in mental status 06/05/2018  . AKI (acute kidney injury) ([place]) 05/28/2018  . Healthcare-associated pneumonia 06/09/2018  . Dehydration   . Severe protein-calorie malnutrition [employer]: less than 60% of standard weight) ([place])   . Palliative care by specialist   . DNR (do not resuscitate)   . Failure to thrive (0-17)   . Tachycardia   . Metastatic urothelial carcinoma ([place])   . FTT (failure to thrive) in adult 05/02/2018  . Acute encephalopathy 04/13/2018  . Mild renal insufficiency 04/13/2018  . LFTs abnormal 04/13/2018  . Hypertensive urgency 04/13/2018  . Protein-calorie malnutrition, severe 03/12/2018  . Renal mass 03/09/2018  . Coronary artery calcification 02/14/2018  . Near  syncope 02/02/2018  . Orthostatic hypotension 02/02/2018  . Anemia 02/02/2018  . Suprapubic discomfort 07/06/2017  . Vitamin D deficiency 08/19/2015  . Closed right hip fracture ([place]) 05/30/2015  . Chest pain syndrome 05/30/2015  . Fall at home 05/30/2015  . Closed right femoral fracture ([place]) 05/30/2015  . Displaced fracture of right femoral neck ([place]) 05/30/2015  . Fall   . Cough 07/02/2013  . Other dyspnea and respiratory abnormality 07/02/2013  . History of colon cancer, stage III 07/02/2013  . Chronic cholecystitis with calculus s/p lap chole 11/17/2012 11/19/2012  . Hyponatremia 11/16/2012  . GERD (gastroesophageal reflux disease) 11/16/2012  . Choledocholithiasis with obstruction 11/16/2012  . Calcification of aorta ([facility]) 11/16/2012  . Atherosclerosis 11/16/2012  . Hypothyroidism 11/15/2012  . Nonspecific (abnormal) findings on radiological and other examination of gastrointestinal tract 11/08/2012  . S/P right knee replacement 12/22/2010  . Goals of care, counseling/discussion 12/08/2010  . Essential hypertension 12/08/2010  . Hyperlipidemia 12/08/2010    Palliative Care Assessment & Plan   Patient Profile: Palliative Care consult requested for this 85 y.o. female with multiple medical problems including stage IV metastatic urothelial carcinoma of the left renal pelvis extending into the perinephric fat and adrenal gland (02/2018), mention, cecal carcinoma status post right hemicolectomy (2003), EMEA, type 2 diabetes, GERD, hypertension, hypothyroidism, and osteoporosis.  Patient was admitted after complaints from nursing home facility of altered mental status with increased confusion, decreased response, and decreased p.o. intake.  CT of abdomen and pelvis with contrast showed multiple hepatic lesions consistent with metastatic disease and prominent stool volume in the distal colon and rectum.   Assessment: Metastatic urothelial carcinoma s/p left nephrectomy Adult failure  to thrive Dehydration Possible left lower lobe pneumonia AKI Hypernatremia  Recommendations/Plan:  Comfort measures only. Interventions not aimed at comfort have been discontinued.   Continue prn comfort medications.   Good oral care.  Comfort feeds per  patient/family request.  Lift visitor restriction to allow son, Karely Hurtado, to visit his mother at EOL.   Discussed with Dr. Alfredia Ferguson. Patient appears to be getting closer to EOL with shallow, irregular respirations. High risk for decompensation and death if transfer to hospice. Anticipate hospital death.   Goals of Care and Additional Recommendations:  Limitations on Scope of Treatment: Full Comfort Care  Code Status: DNR   Code Status Orders  (From admission, onward)         Start     Ordered   05/25/18 1458  Do not attempt resuscitation (DNR)  Continuous    Question Answer Comment  In the event of cardiac or respiratory ARREST Do not call a "code blue"   In the event of cardiac or respiratory ARREST Do not perform Intubation, CPR, defibrillation or ACLS   In the event of cardiac or respiratory ARREST Use medication by any route, position, wound care, and other measures to relive pain and suffering. May use oxygen, suction and manual treatment of airway obstruction as needed for comfort.      05/25/18 1500        Code Status History    Date Active Date Inactive Code Status Order ID Comments User Context   05/28/2018 1524 05/25/2018 1500 DNR [id number]  Georgette Shell, MD ED   05/03/2018 1400 05/04/2018 1740 DNR [id number]  Basilio Cairo, NP Inpatient   05/02/2018 2255 05/03/2018 1400 Full Code [id number]  Bethena Roys, MD Inpatient   04/13/2018 2338 04/16/2018 1540 Full Code [id number]  Vianne Bulls, MD ED   02/02/2018 1632 02/03/2018 2000 Full Code [id number]  Flora Lipps, MD Inpatient   05/30/2015 1541 06/01/2015 1859 Full Code [id number]  Samella Parr, NP Inpatient   11/15/2012 1640 11/19/2012 1936 Full  Code [id number]  Kinnie Feil, MD Inpatient    Advance Directive Documentation     Most Recent Value  Type of Advance Directive  Out of facility DNR (pink MOST or yellow form)  Pre-existing out of facility DNR order (yellow form or pink MOST form)  Yellow form placed in chart (order not valid for inpatient use)  "MOST" Form in Place?  -       Prognosis:   Hours - Days  Discharge Planning:  Anticipated Hospital Death  Care plan was discussed with RN, son, Dr Alfredia Ferguson  Thank you for allowing the Palliative Medicine Team to assist in the care of this patient.   Time In: 0830- Time Out: 0855 Total Time 25 Prolonged Time Billed  no      Greater than 50%  of this time was spent counseling and coordinating care related to the above assessment and plan.  Ihor Dow, FNP-C Palliative Medicine Team  Phone: [phone] Fax: [phone]  Please contact Palliative Medicine Team phone at [phone] for questions and concerns.

## 2018-05-28 NOTE — Care Management Important Message (Signed)
Important Message  Patient Details IM Letter given to Velva Harman RN to present to the Patient Name: PRECIOSA BUNDRICK MRN: [id number] Date of Birth: October 30, 1932   Medicare Important Message Given:  Yes    Kerin Salen 05/28/2018, 11:04 AMImportant Message  Patient Details  Name: ZARAI ORSBORN MRN: [id number] Date of Birth: 30-Apr-1932   Medicare Important Message Given:  Yes    Kerin Salen 05/28/2018, 11:04 AM

## 2018-05-29 LAB — CULTURE, BLOOD (ROUTINE X 2)
Culture: NO GROWTH
Culture: NO GROWTH

## 2018-06-12 ENCOUNTER — Ambulatory Visit (HOSPITAL_COMMUNITY): Payer: Medicare Other

## 2018-06-12 ENCOUNTER — Ambulatory Visit (HOSPITAL_COMMUNITY): Admission: RE | Admit: 2018-06-12 | Payer: Medicare Other | Source: Ambulatory Visit

## 2018-06-12 ENCOUNTER — Other Ambulatory Visit: Payer: Medicare Other

## 2018-06-14 ENCOUNTER — Ambulatory Visit: Payer: Medicare Other | Admitting: Oncology

## 2018-06-18 NOTE — Death Summary Note (Signed)
DEATH SUMMARY   Patient Details  Name: [name] MRN: [id number] DOB: 05/14/1932  Admission/Discharge Information   Admit Date:  06/02/18  Date of Death: Date of Death: 06-07-18  Time of Death: Time of Death: Apr 19, 2048  Length of Stay: 5  Referring Physician: [name], MD   Reason(s) for Hospitalization  Change in Mental Status  Diagnoses  Preliminary cause of death:    Cardiopulmonary Arrest in the setting of Respiratory Failure from Suspected LLL PNA  Secondary Diagnoses (including complications and co-morbidities):   Multifactorial Acute Change in Mental Status in the Setting of Metastatic Cancer, Dehydration and Pneumonia  High-grade Urothelial carcinoma status post left nephrectomy with possible liver mets   Failure to Thrive with recurrent hospital admissions for dehydration and decreased p.o. intake.   Tachycardia  Hypertension   Dehydration with dry mucous membrane and change in mental status   AKI   Abnormal LFTs   Hypernatermia/Hyperchloremia  Constipation    FOBT positive in the ER  - Active Problems:   Terminal care   Dehydration   Change in mental status   AKI (acute kidney injury) [facility])   Healthcare-associated pneumonia   Brief Hospital Course (including significant findings, care, treatment, and services provided and events leading to death)  HPI per 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 Upon discussion with the son he reported that she slept day and night at home. She was sent from the nursing home today with complaints of change in mental  status and increasing confusion and less responsive with decreased p.o. intake. No documented fever chills cough nausea vomiting diarrhea or constipation. History is obtained mainly from the records and patient's son and the ER physician. Patient unable to provide any history she only responds to pain. She has history of recurrent falls.  **Interim History  Patient remained non-communicative and respond to painful stimuli but is now not very responsive.  Medical oncology was consulted and recommending comfort care.  Palliative consulted for goals of care discussion and she was continued to be treated until palliative evaluated and they discussed with the family about goals of care and patient was trying to transition to comfort care.  Patient had a high risk for decompensation, worsening and subsequently passed away on the morning of 2018/06/07 at 0050.  Pertinent Labs and Studies  Significant Diagnostic Studies Dg Chest 1 View  Result Date: 05/02/2018 CLINICAL DATA:  85 year old female with a fall EXAM: CHEST  1 VIEW COMPARISON:  04/13/2018 FINDINGS: Cardiomediastinal silhouette unchanged in size and contour. No evidence of central vascular congestion or interlobular septal thickening. Diffuse coarsening of interstitial markings similar to the prior. No pneumothorax or pleural effusion. No confluent airspace disease. Pleuroparenchymal thickening at the apices. No acute displaced fracture. Degenerative changes of the spine and the shoulders. IMPRESSION: Negative for acute cardiopulmonary disease Electronically Signed   By: Corrie Mckusick D.O.   On: 05/02/2018 18:59   Dg Chest 2 View  Result Date: 2018/06/02 CLINICAL DATA:  Weakness. EXAM: CHEST - 2 VIEW COMPARISON:  05/18/2018 FINDINGS: Patchy opacity noted right lung base. Interstitial markings are diffusely coarsened with chronic features. Stable asymmetric elevation right hemidiaphragm. The  cardiopericardial silhouette is within normal limits for  size. The visualized bony structures of the thorax are intact. Status post lower thoracic vertebral augmentation. Telemetry leads overlie the chest. IMPRESSION: Underlying chronic interstitial changes with hazy opacity at the right lung base, suspicious for pneumonia. Electronically Signed   By: Misty Stanley M.D.   On: 05/23/2018 12:50   Dg Pelvis 1-2 Views  Result Date: 05/02/2018 CLINICAL DATA:  85 year old female with fall EXAM: PELVIS - 1-2 VIEW COMPARISON:  02/27/2010 FINDINGS: Bony pelvic ring is intact. No acute fracture identified. Surgical changes of right hip arthroplasty. Components are aligned. Left hip projects normally over the acetabula. No periprosthetic fracture with unremarkable proximal right femur. Calcified mass of the abdomen, better demonstrated on CT from February. IMPRESSION: No acute bony abnormality. Right hip arthroplasty. Electronically Signed   By: Corrie Mckusick D.O.   On: 05/02/2018 19:58   Ct Head Wo Contrast  Result Date: 06/12/2018 CLINICAL DATA:  Altered level of consciousness EXAM: CT HEAD WITHOUT CONTRAST TECHNIQUE: Contiguous axial images were obtained from the base of the skull through the vertex without intravenous contrast. COMPARISON:  05/02/2018 FINDINGS: Motion degraded images. Brain: No evidence of acute infarction, hemorrhage, hydrocephalus, extra-axial collection or mass lesion/mass effect. Subcortical white matter and periventricular small vessel ischemic changes. Vascular: Mild intracranial atherosclerosis. Skull: Normal. Negative for fracture or focal lesion. Sinuses/Orbits: The visualized paranasal sinuses are essentially clear. The mastoid air cells are unopacified. Other: None. IMPRESSION: Motion degraded images. No evidence of acute intracranial abnormality. Small vessel ischemic changes. Electronically Signed   By: Julian Hy M.D.   On: 06/05/2018 12:22   Ct Head Wo Contrast  Result Date: 05/02/2018 CLINICAL DATA:  85 year old female with a  fall EXAM: CT HEAD WITHOUT CONTRAST TECHNIQUE: Contiguous axial images were obtained from the base of the skull through the vertex without intravenous contrast. COMPARISON:  MR 04/11/2018, no prior head CT FINDINGS: Brain: No acute intracranial hemorrhage. No midline shift or mass effect. Gray-white differentiation maintained. Unremarkable appearance of the ventricular system. Brain volume loss. Patchy hypodensity in the periventricular white matter. Vascular: Vascular calcifications Skull: No acute fracture.  No aggressive bone lesion identified. Sinuses/Orbits: Unremarkable appearance of the orbits. Mastoid air cells clear. No middle ear effusion. No significant sinus disease. Other: None IMPRESSION: Negative for acute intracranial abnormality. Evidence of chronic microvascular ischemic disease. Electronically Signed   By: Corrie Mckusick D.O.   On: 05/02/2018 19:01   Ct Abdomen Pelvis W Contrast  Result Date: 05/27/2018 CLINICAL DATA:  Abdominal pain with nausea vomiting. EXAM: CT ABDOMEN AND PELVIS WITH CONTRAST TECHNIQUE: Multidetector CT imaging of the abdomen and pelvis was performed using the standard protocol following bolus administration of intravenous contrast. CONTRAST:  16mL OMNIPAQUE IOHEXOL 300 MG/ML  SOLN COMPARISON:  To 12/07/2018 FINDINGS: Lower chest: Coronary artery calcification is evident. Emphysema noted in the lung bases. Stable 9 mm ground-glass nodule posterior right lower lobe. Hepatobiliary: New hypervascular 10 mm lesion in the dome of liver (11/2) liver parenchyma heterogeneous. 3.4 cm rim enhancing lesion in the left liver (16/7) is new in the interval. 2.2 cm lesion in the central right liver (23/2). Areas of irregular subcapsular enhancement are seen scattered in the right and left hepatic lobes. Gallbladder surgically absent. No extrahepatic biliary duct dilatation although pneumobilia suggests prior sphincterotomy. Obstructs the right intrahepatic bile ducts resulting in new  dilatation. Pancreas: Diffuse pancreatic atrophy. No dilatation of the main duct. Spleen: Similar appearance 7 mm low-density lesion inferior spleen. Adrenals/Urinary Tract: Right  adrenal gland and kidney unremarkable. Patient status post interval left left nephrectomy and adrenalectomy. Plaque-like area of irregular peripherally enhancing soft tissues noted in the left retroperitoneal space, measuring 10 cm in craniocaudal length by 8 cm in AP dimension by 2.7 cm in coronal diameter. This tracks caudally down along the left psoas muscle towards the pelvis. Lateral extent of this soft tissue towards the tail of pancreas appears to occlude the splenic vein. Bladder is partially obscured by artifact from right hip replacement. Stomach/Bowel: Stomach is unremarkable. No gastric wall thickening. No evidence of outlet obstruction. Duodenum is normally positioned as is the ligament of Treitz. No small bowel wall thickening. No small bowel dilatation. Colon is nondilated. Prominent stool volume in the sigmoid colon and rectum. Vascular/Lymphatic: There is abdominal aortic atherosclerosis without aneurysm. No pelvic sidewall lymphadenopathy. Reproductive: The uterus is surgically absent. There is no adnexal mass. Other: Calcified irregular right mesenteric lesion is similar to prior. As mentioned above, there is plaque-like irregular peripherally enhancing soft tissue in the left nephrectomy bed tracks cranio caudally along the psoas muscle. Anteriorly this tracks up along the left side in the anterior to the aorta. Posteriorly it tracks around behind the spleen in the down caudally into the left paracolic gutter (image 00/1) where it may involve the medial wall of the distal descending colon (38/2). As noted above, the soft tissue extends laterally towards the tail of pancreas and appears to obstruct the splenic vein. 14 mm omental nodule identified in the splenic flexure on 31/2. 14 mm nodule seen anterior to the  descending colon on 55/2 Musculoskeletal: Status post right hip replacement. Age indeterminate fracture of the posterior left eighth rib with possible inferior left scapula fracture noted on image 1 of series 2. IMPRESSION: 1. Interval left nephrectomy and adrenalectomy. There is now a large, irregular, peripherally-enhancing soft tissue process in the left retroperitoneal space, tracking cranio caudally along the psoas muscle, anteriorly up anterior to the aorta and posteriorly behind the spleen and the down into the lower left paracolic gutter. Imaging features are most suggestive of markedly advanced local recurrence of neoplasm. Although dissecting abscess is possible, this is felt to be less likely based on imaging. There is apparent involvement of the distal descending colon and inferior spleen. Lateral extent towards the tail of pancreas obliterates the splenic vein. 2. Interval development of multiple hepatic lesions consistent with metastatic disease. A central right liver lesion obstructs right hepatic bile ducts. 3. 1.4 cm metastatic lesion in the omentum of the splenic flexure with another 1.4 cm nodule adjacent to the descending colon. 4. Stable appearance of the calcified irregular central mesenteric lesion of the right paramidline abdomen. 5. Prominent stool volume distal colon and rectum. 6.  Aortic Atherosclerois (ICD10-170.0) Electronically Signed   By: Misty Stanley M.D.   On: 05/20/2018 15:03   Dg Chest Port 1 View  Result Date: 05/18/2018 CLINICAL DATA:  Chest pain. EXAM: PORTABLE CHEST 1 VIEW COMPARISON:  05/02/2018 FINDINGS: There is a mildly displaced posterior left seventh rib fracture that may be new. Negative for a pneumothorax. No focal lung disease or overt pulmonary edema. Heart and mediastinum are within normal limits. Again noted is bone cement in a lower thoracic vertebral body compatible with an augmentation procedure. Atherosclerotic calcifications at the aortic arch.  IMPRESSION: Probable new left seventh rib fracture.  Negative for pneumothorax. Electronically Signed   By: Markus Daft M.D.   On: 05/18/2018 09:55   Dg Shoulder Left  Result Date: 05/02/2018  CLINICAL DATA:  85 year old female with a fall EXAM: LEFT SHOULDER - 2+ VIEW COMPARISON:  None. FINDINGS: Osteopenia. Glenohumeral joint appears congruent. No acute displaced fracture. Unremarkable clavicle. Degenerative changes of the acromioclavicular joint. Significant subacromial spurring. Rounded calcifications overlying the humerus likely representing loose bodies. IMPRESSION: Negative for acute bony abnormality. Degenerative changes Electronically Signed   By: Corrie Mckusick D.O.   On: 05/02/2018 19:00   Microbiology Recent Results (from the past 240 hour(s))  Urine culture     Status: None   Collection Time: 06/17/2018  1:03 PM  Result Value Ref Range Status   Specimen Description   Final    URINE, RANDOM Performed at [facility] [address], [place], [place] [address]    Special Requests   Final    NONE Performed at [facility], [place] [address], [place], [place] [address]    Culture   Final    NO GROWTH Performed at [facility], [place] [address] [place], [place] [address]    Report Status 05/25/2018 FINAL  Final  Blood Culture (routine x 2)     Status: None   Collection Time: 06/15/2018  2:09 PM  Result Value Ref Range Status   Specimen Description   Final    BLOOD LEFT ARM Performed at [facility], [place] [address] [place], [place] [address]    Special Requests   Final    BOTTLES DRAWN AEROBIC AND ANAEROBIC Blood Culture results may not be optimal due to an inadequate volume of blood received in culture bottles Performed at [facility] [address], [place], [place] [address]    Culture   Final    NO GROWTH 5 DAYS Performed at [facility], [place] [address] [place], [place] [address]    Report Status  30-May-2018 FINAL  Final  Blood Culture (routine x 2)     Status: None   Collection Time: 06/13/2018  2:14 PM  Result Value Ref Range Status   Specimen Description   Final    BLOOD LEFT ARM Performed at [facility], [place] [address], [place], [place] [address]    Special Requests   Final    BOTTLES DRAWN AEROBIC AND ANAEROBIC Blood Culture results may not be optimal due to an inadequate volume of blood received in culture bottles Performed at [facility] [address] [place], [place] [address]    Culture   Final    NO GROWTH 5 DAYS Performed at [facility], [place] [address], [place],  [address]    Report Status 2018/05/30 FINAL  Final  SARS Coronavirus 2 (CEPHEID - Performed in [facility] lab), Hosp Order     Status: None   Collection Time: 06/13/2018  4:13 PM  Result Value Ref Range Status   SARS Coronavirus 2 NEGATIVE NEGATIVE Final    Comment: (NOTE) If result is NEGATIVE SARS-CoV-2 target nucleic acids are NOT DETECTED. The SARS-CoV-2 RNA is generally detectable in upper and lower  respiratory specimens during the acute phase of infection. The lowest  concentration of SARS-CoV-2 viral copies this assay can detect is 250  copies / mL. A negative result does not preclude SARS-CoV-2 infection  and should not be used as the sole basis for treatment or other  patient management decisions.  A negative result may occur with  improper specimen collection / handling, submission of specimen other  than nasopharyngeal swab, presence  of viral mutation(s) within the  areas targeted by this assay, and inadequate number of viral copies  (<250 copies / mL). A negative result must be combined with clinical  observations, patient history, and epidemiological information. If result is POSITIVE SARS-CoV-2 target nucleic acids are DETECTED. The SARS-CoV-2 RNA is generally detectable in upper and lower  respiratory specimens dur ing the acute phase  of infection.  Positive  results are indicative of active infection with SARS-CoV-2.  Clinical  correlation with patient history and other diagnostic information is  necessary to determine patient infection status.  Positive results do  not rule out bacterial infection or co-infection with other viruses. If result is PRESUMPTIVE POSTIVE SARS-CoV-2 nucleic acids MAY BE PRESENT.   A presumptive positive result was obtained on the submitted specimen  and confirmed on repeat testing.  While 2019 novel coronavirus  (SARS-CoV-2) nucleic acids may be present in the submitted sample  additional confirmatory testing may be necessary for epidemiological  and / or clinical management purposes  to differentiate between  SARS-CoV-2 and other Sarbecovirus currently known to infect humans.  If clinically indicated additional testing with an alternate test  methodology [phone]) is advised. The SARS-CoV-2 RNA is generally  detectable in upper and lower respiratory sp ecimens during the acute  phase of infection. The expected result is Negative. Fact Sheet for Patients:  StrictlyIdeas.no Fact Sheet for Healthcare Providers: BankingDealers.co.za This test is not yet approved or cleared by the [place] FDA and has been authorized for detection and/or diagnosis of SARS-CoV-2 by FDA under an Emergency Use Authorization (EUA).  This EUA will remain in effect (meaning this test can be used) for the duration of the COVID-19 declaration under Section 564(b)(1) of the Act, 21 U.S.C. section 360bbb-3(b)(1), unless the authorization is terminated or revoked sooner. Performed at [facility], [place] [address], [place],  [address]    Lab Basic Metabolic Panel: Recent Labs  Lab 06/02/2018 1112 05/25/18 0554  NA 142 146*  K 4.0 3.7  CL 104 112*  CO2 26 22  GLUCOSE 170* 159*  BUN 36* 23  CREATININE 1.46* 0.96  CALCIUM 9.5 9.0    Liver Function Tests: Recent Labs  Lab 05/25/2018 1112 05/25/18 0554  AST 49* 39  ALT 55* 47*  ALKPHOS 1,698* 1,332*  BILITOT 0.9 1.1  PROT 7.6 6.6  ALBUMIN 2.6* 2.3*   No results for input(s): LIPASE, AMYLASE in the last 168 hours. Recent Labs  Lab 05/26/2018 1117  AMMONIA 29   CBC: Recent Labs  Lab 05/28/2018 1112 05/25/18 0554  WBC 18.3* 18.9*  NEUTROABS 15.7*  --   HGB 11.6* 11.6*  HCT 37.5 39.2  MCV 101.6* 102.1*  PLT 559* 503*   Cardiac Enzymes: No results for input(s): CKTOTAL, CKMB, CKMBINDEX, TROPONINI in the last 168 hours. Sepsis Labs: Recent Labs  Lab 06/14/2018 1112 05/19/2018 1117 05/21/2018 1451 05/25/18 0554  WBC 18.3*  --   --  18.9*  LATICACIDVEN  --  1.5 1.5  --     Procedures/Operations  None  Kerney Elbe, DO 2018-06-21, 8:42 PM

## 2018-06-18 NOTE — Death Summary Note (Cosign Needed Addendum)
Patient time of death occurred at 49, second RN Aldine Contes.

## 2018-06-18 DEATH — deceased

## 2018-07-05 ENCOUNTER — Other Ambulatory Visit: Payer: Medicare Other

## 2018-07-05 ENCOUNTER — Ambulatory Visit: Payer: Medicare Other | Admitting: Hematology and Oncology

## 2020-12-10 IMAGING — RF DG ESOPHAGUS
7 of 8 series · 14 of 19 positions shown · non-contrast
Comparison: None.

CLINICAL DATA: Dysphagia

EXAM:
ESOPHOGRAM/BARIUM SWALLOW
TECHNIQUE: Single contrast examination was performed using  thin barium.
FLUOROSCOPY TIME:  Fluoroscopy Time:  2 minutes 6 second
Radiation Exposure Index (if provided by the fluoroscopic device):
93 mGy
Number of Acquired Spot Images: 0

[Series 1: sequence · 0.28mm/px · 2 of 25 frames shown (1 of 6)]
[frame 4/25]
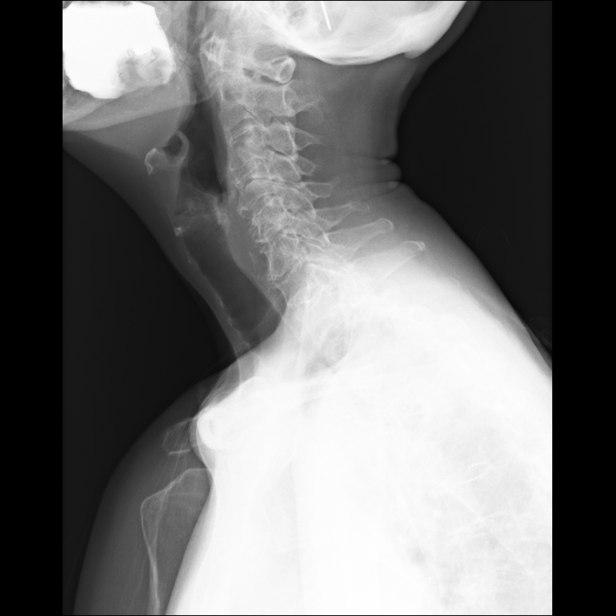
[frame 22/25]
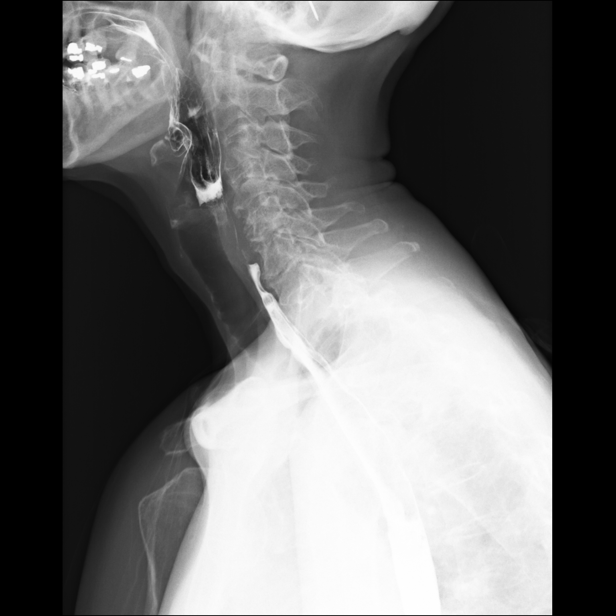

[Series 2: sequence · 0.28mm/px · 3 of 29 frames shown (2 of 6)]
[frame 5/29]
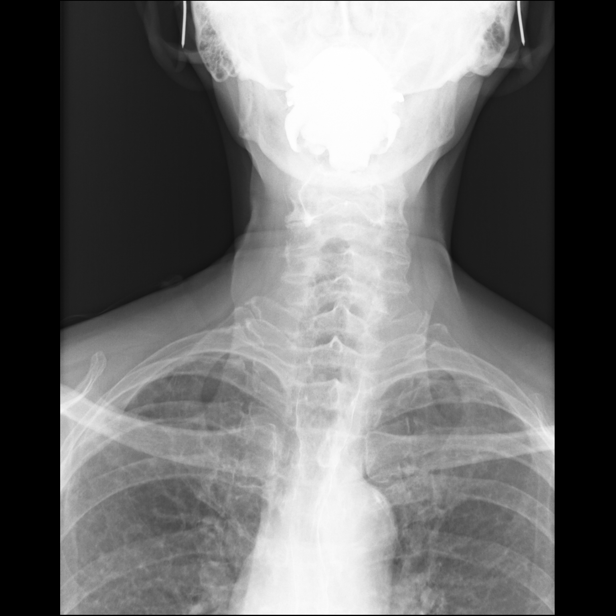
[frame 15/29]
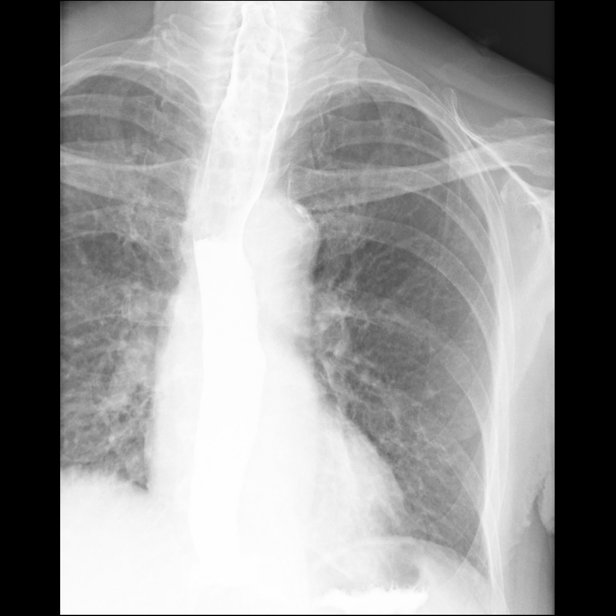
[frame 29/29]
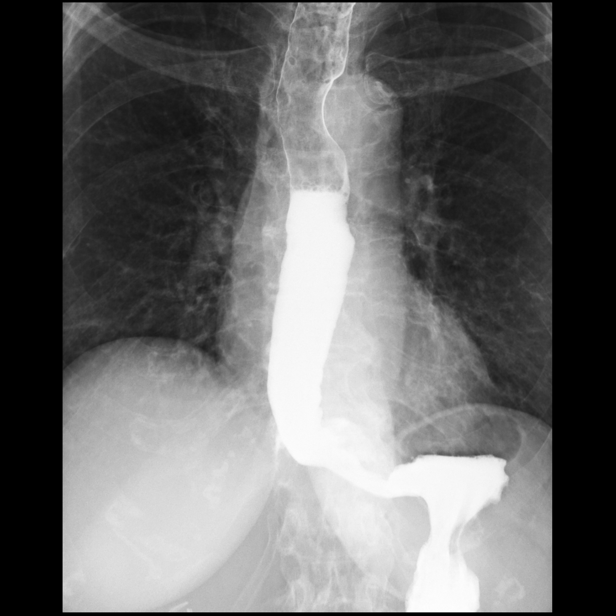

[Series 3: sequence · 0.28mm/px · 3 of 20 frames shown (3 of 6)]
[frame 3/20]
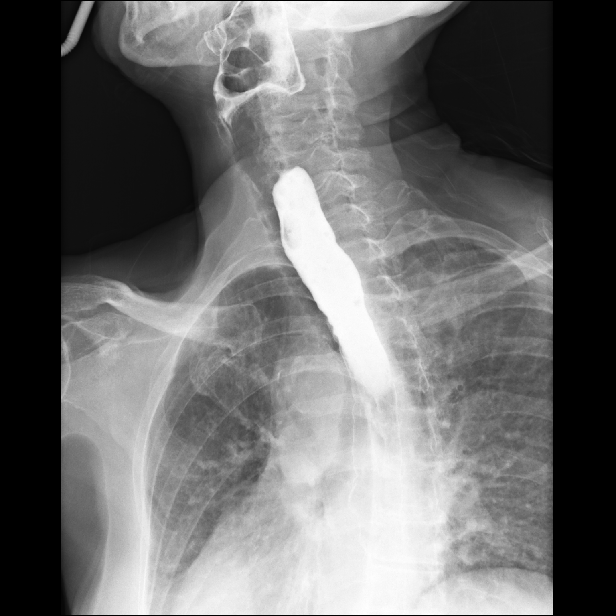
[frame 4/20]
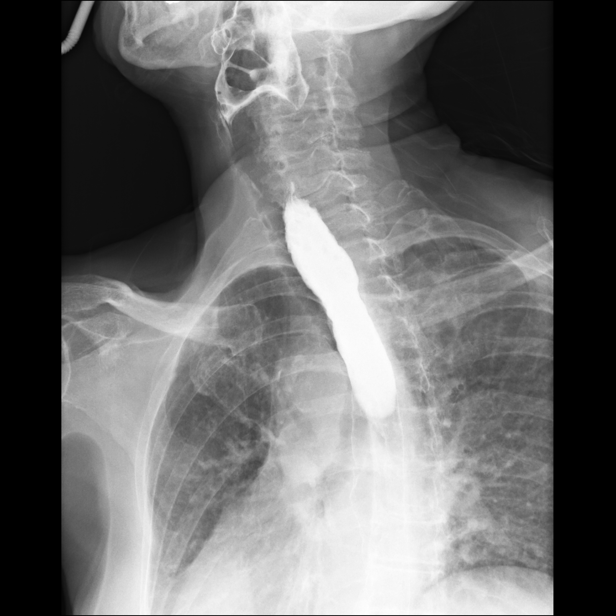
[frame 18/20]
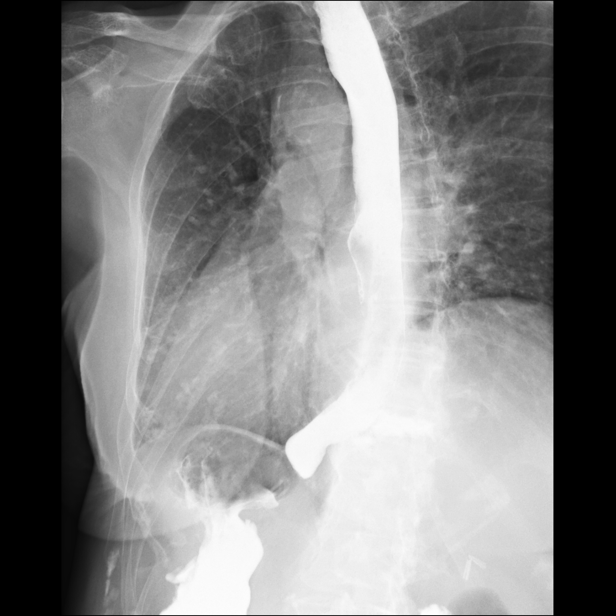

[Series 4: sequence · 0.28mm/px · 3 of 7 frames shown (4 of 6)]
[frame 1/7]
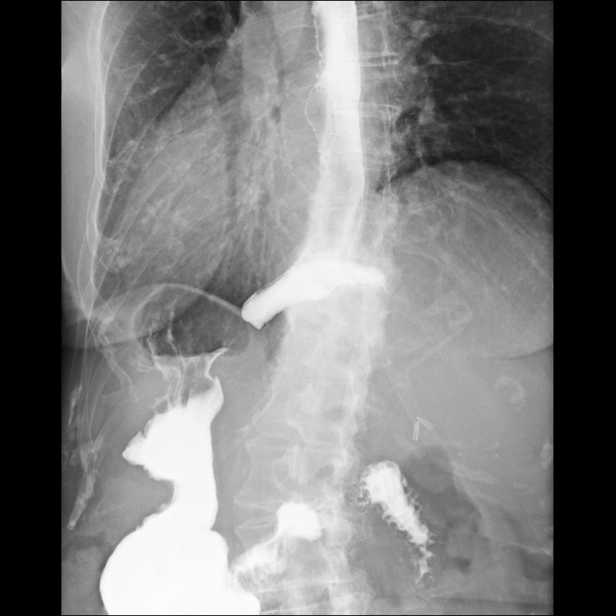
[frame 2/7]
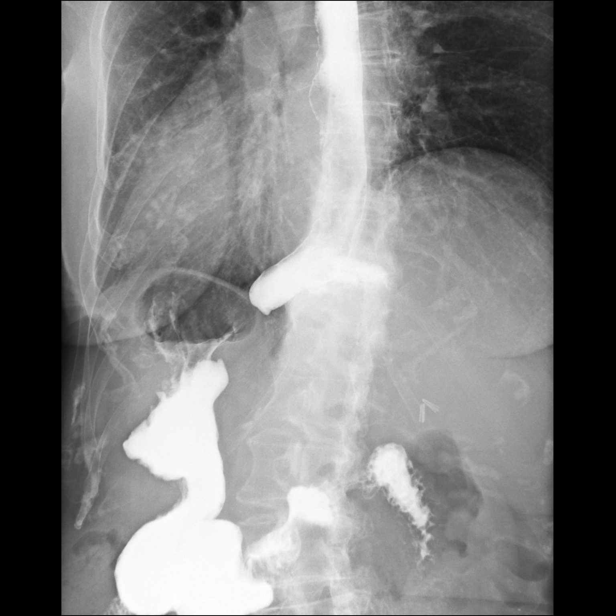
[frame 6/7]
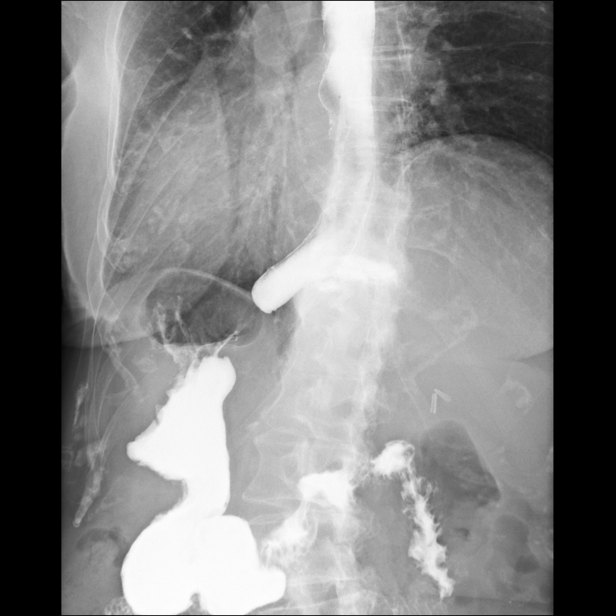

[Series 5: sequence · 0.28mm/px · 1 of 1 slices shown (5 of 6)]
[im 1/1]
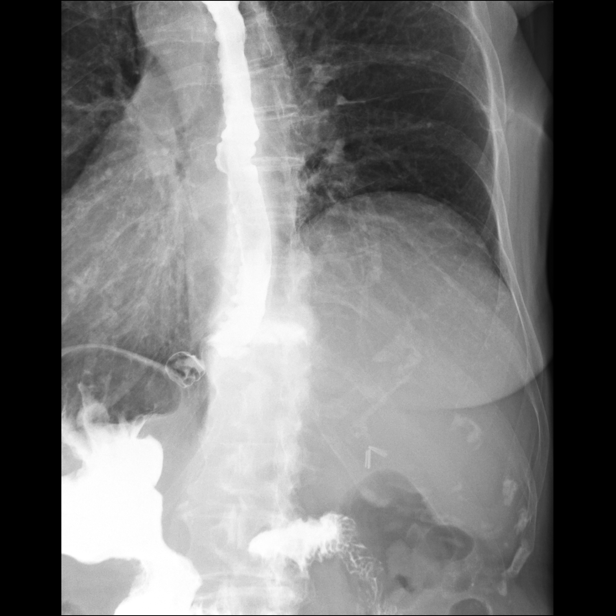

[Series 6: sequence · 0.28mm/px · 1 of 1 slices shown (6 of 6)]
[im 1/1]
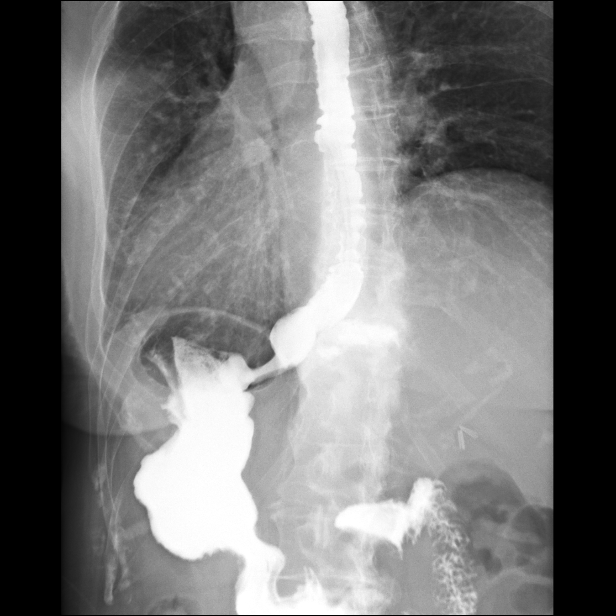

[Series 8: one shot · 1 of 1 slices shown]
[im 1/1]
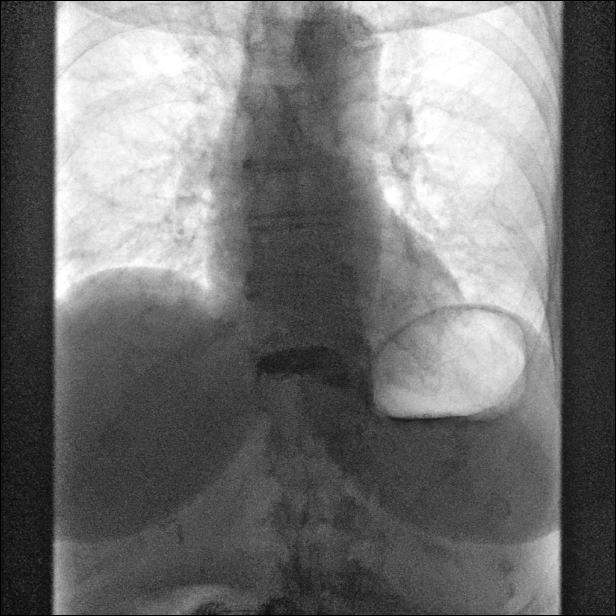

[14 of 19 positions shown; findings below may reference images not displayed]

FINDINGS: Initially rapid sequence spot films of the cervical esophagus were
obtained in the lateral and frontal projections. There is no
evidence of penetration or aspiration. The patient gives a history
of the thyroid goiter, but no significant impingement upon the lower
cervical esophagus is seen. There is mild narrowing of the lower
cervical esophagus consistent with mild bleb.

Images of esophageal peristalsis show moderate tertiary contractions
in the mid and distal esophagus. No definite hiatal hernia could be
detected. No gastroesophageal reflux is seen with the water siphon
maneuver. The patient was given a barium pill at the end of the
study. The pill did initially lodged within the vallecula, but after
repeated swallowing, the pill did pass into the stomach without
significant delay distally.
IMPRESSION: 1. Mild to moderate tertiary contractions in the mid and distal
esophagus.
2. No evidence of aspiration or penetration.
3. Barium pill lodges in the vallecula but does pass into the
stomach after repeated swallows without delay.
4. No definite hiatal hernia or gastroesophageal reflux is seen.
